# Patient Record
Sex: Female | Born: 1935 | Race: White | Hispanic: No | Smoking: Current every day smoker
Health system: Southern US, Community
[De-identification: ages and names within clinical notes are randomized; demographics above are authoritative.]

## PROBLEM LIST (undated history)

## (undated) DIAGNOSIS — N289 Disorder of kidney and ureter, unspecified: Secondary | ICD-10-CM

## (undated) DIAGNOSIS — I219 Acute myocardial infarction, unspecified: Secondary | ICD-10-CM

## (undated) DIAGNOSIS — E785 Hyperlipidemia, unspecified: Secondary | ICD-10-CM

## (undated) DIAGNOSIS — M810 Age-related osteoporosis without current pathological fracture: Secondary | ICD-10-CM

## (undated) DIAGNOSIS — I701 Atherosclerosis of renal artery: Secondary | ICD-10-CM

## (undated) DIAGNOSIS — C50919 Malignant neoplasm of unspecified site of unspecified female breast: Secondary | ICD-10-CM

## (undated) DIAGNOSIS — Z923 Personal history of irradiation: Secondary | ICD-10-CM

## (undated) DIAGNOSIS — I10 Essential (primary) hypertension: Secondary | ICD-10-CM

## (undated) DIAGNOSIS — M1990 Unspecified osteoarthritis, unspecified site: Secondary | ICD-10-CM

## (undated) DIAGNOSIS — I499 Cardiac arrhythmia, unspecified: Secondary | ICD-10-CM

## (undated) DIAGNOSIS — J449 Chronic obstructive pulmonary disease, unspecified: Secondary | ICD-10-CM

## (undated) DIAGNOSIS — Q828 Other specified congenital malformations of skin: Secondary | ICD-10-CM

## (undated) DIAGNOSIS — G43909 Migraine, unspecified, not intractable, without status migrainosus: Secondary | ICD-10-CM

## (undated) DIAGNOSIS — IMO0002 Reserved for concepts with insufficient information to code with codable children: Secondary | ICD-10-CM

## (undated) DIAGNOSIS — I2510 Atherosclerotic heart disease of native coronary artery without angina pectoris: Secondary | ICD-10-CM

## (undated) DIAGNOSIS — C3490 Malignant neoplasm of unspecified part of unspecified bronchus or lung: Secondary | ICD-10-CM

## (undated) HISTORY — PX: PARATHYROIDECTOMY: SHX19

## (undated) HISTORY — PX: TONSILLECTOMY: SUR1361

## (undated) HISTORY — DX: Chronic obstructive pulmonary disease, unspecified: J44.9

## (undated) HISTORY — PX: APPENDECTOMY: SHX54

## (undated) HISTORY — DX: Age-related osteoporosis without current pathological fracture: M81.0

## (undated) HISTORY — DX: Malignant neoplasm of unspecified part of unspecified bronchus or lung: C34.90

## (undated) HISTORY — PX: ABDOMINAL HYSTERECTOMY: SHX81

## (undated) HISTORY — DX: Atherosclerosis of renal artery: I70.1

## (undated) HISTORY — DX: Hyperlipidemia, unspecified: E78.5

## (undated) HISTORY — DX: Malignant neoplasm of unspecified site of unspecified female breast: C50.919

## (undated) HISTORY — DX: Essential (primary) hypertension: I10

## (undated) HISTORY — PX: OTHER SURGICAL HISTORY: SHX169

## (undated) HISTORY — DX: Acute myocardial infarction, unspecified: I21.9

## (undated) HISTORY — DX: Other specified congenital malformations of skin: Q82.8

## (undated) HISTORY — DX: Migraine, unspecified, not intractable, without status migrainosus: G43.909

---

## 2001-05-17 DIAGNOSIS — I252 Old myocardial infarction: Secondary | ICD-10-CM | POA: Insufficient documentation

## 2005-01-29 ENCOUNTER — Ambulatory Visit: Payer: Self-pay | Admitting: Internal Medicine

## 2005-03-03 ENCOUNTER — Ambulatory Visit: Payer: Self-pay | Admitting: Internal Medicine

## 2005-03-23 ENCOUNTER — Emergency Department: Payer: Self-pay | Admitting: Emergency Medicine

## 2005-08-06 ENCOUNTER — Ambulatory Visit: Payer: Self-pay | Admitting: Internal Medicine

## 2005-08-27 ENCOUNTER — Ambulatory Visit: Payer: Self-pay | Admitting: Internal Medicine

## 2006-02-04 ENCOUNTER — Ambulatory Visit: Payer: Self-pay | Admitting: Internal Medicine

## 2006-02-23 ENCOUNTER — Ambulatory Visit: Payer: Self-pay | Admitting: Internal Medicine

## 2006-08-31 ENCOUNTER — Ambulatory Visit: Payer: Self-pay | Admitting: Internal Medicine

## 2006-09-07 ENCOUNTER — Ambulatory Visit: Payer: Self-pay | Admitting: Internal Medicine

## 2007-10-19 ENCOUNTER — Ambulatory Visit: Payer: Self-pay | Admitting: Family Medicine

## 2008-05-17 ENCOUNTER — Ambulatory Visit: Payer: Self-pay | Admitting: Gastroenterology

## 2008-05-18 ENCOUNTER — Ambulatory Visit: Payer: Self-pay | Admitting: Unknown Physician Specialty

## 2009-03-05 ENCOUNTER — Ambulatory Visit: Payer: Self-pay | Admitting: Internal Medicine

## 2011-03-25 ENCOUNTER — Ambulatory Visit: Payer: Self-pay | Admitting: Internal Medicine

## 2011-03-26 ENCOUNTER — Ambulatory Visit: Payer: Self-pay | Admitting: Internal Medicine

## 2011-11-18 DIAGNOSIS — IMO0001 Reserved for inherently not codable concepts without codable children: Secondary | ICD-10-CM

## 2011-11-18 DIAGNOSIS — C50919 Malignant neoplasm of unspecified site of unspecified female breast: Secondary | ICD-10-CM

## 2011-11-18 DIAGNOSIS — Z923 Personal history of irradiation: Secondary | ICD-10-CM

## 2011-11-18 HISTORY — DX: Personal history of irradiation: Z92.3

## 2011-11-18 HISTORY — DX: Malignant neoplasm of unspecified site of unspecified female breast: C50.919

## 2011-11-18 HISTORY — PX: BREAST LUMPECTOMY: SHX2

## 2011-11-18 HISTORY — DX: Reserved for inherently not codable concepts without codable children: IMO0001

## 2011-12-01 ENCOUNTER — Ambulatory Visit: Payer: Self-pay | Admitting: Gastroenterology

## 2012-02-09 ENCOUNTER — Ambulatory Visit: Payer: Self-pay | Admitting: Internal Medicine

## 2012-07-09 ENCOUNTER — Ambulatory Visit: Payer: Self-pay | Admitting: Internal Medicine

## 2012-07-15 ENCOUNTER — Ambulatory Visit: Payer: Self-pay | Admitting: Internal Medicine

## 2012-08-03 ENCOUNTER — Ambulatory Visit: Payer: Self-pay | Admitting: Surgery

## 2012-08-03 HISTORY — PX: BREAST BIOPSY: SHX20

## 2012-08-04 LAB — PATHOLOGY REPORT

## 2012-08-12 ENCOUNTER — Ambulatory Visit: Payer: Self-pay | Admitting: Surgery

## 2012-08-12 LAB — BASIC METABOLIC PANEL
Anion Gap: 5 — ABNORMAL LOW (ref 7–16)
BUN: 18 mg/dL (ref 7–18)
Chloride: 106 mmol/L (ref 98–107)
Co2: 30 mmol/L (ref 21–32)
Creatinine: 1.26 mg/dL (ref 0.60–1.30)
EGFR (African American): 46 — ABNORMAL LOW
EGFR (Non-African Amer.): 40 — ABNORMAL LOW
Glucose: 83 mg/dL (ref 65–99)
Osmolality: 282 (ref 275–301)
Potassium: 4.8 mmol/L (ref 3.5–5.1)

## 2012-08-12 LAB — CBC
HCT: 42.3 % (ref 35.0–47.0)
HGB: 14.3 g/dL (ref 12.0–16.0)
MCH: 31.2 pg (ref 26.0–34.0)
MCV: 92 fL (ref 80–100)
Platelet: 250 10*3/uL (ref 150–440)
RBC: 4.59 10*6/uL (ref 3.80–5.20)
WBC: 9.1 10*3/uL (ref 3.6–11.0)

## 2012-08-19 ENCOUNTER — Ambulatory Visit: Payer: Self-pay | Admitting: Surgery

## 2012-08-27 LAB — PATHOLOGY REPORT

## 2012-09-01 ENCOUNTER — Ambulatory Visit: Payer: Self-pay | Admitting: Oncology

## 2012-09-17 ENCOUNTER — Ambulatory Visit: Payer: Self-pay | Admitting: Oncology

## 2012-09-23 LAB — CBC CANCER CENTER
Basophil #: 0.1 x10 3/mm (ref 0.0–0.1)
Basophil %: 0.9 %
Eosinophil #: 0.2 x10 3/mm (ref 0.0–0.7)
Eosinophil %: 1.6 %
HGB: 13.6 g/dL (ref 12.0–16.0)
Lymphocyte %: 20.1 %
MCH: 30.1 pg (ref 26.0–34.0)
MCHC: 32.1 g/dL (ref 32.0–36.0)
Neutrophil #: 6.7 x10 3/mm — ABNORMAL HIGH (ref 1.4–6.5)
Neutrophil %: 68.9 %
RBC: 4.52 10*6/uL (ref 3.80–5.20)
RDW: 14.1 % (ref 11.5–14.5)

## 2012-09-30 LAB — CBC CANCER CENTER
Basophil #: 0.1 x10 3/mm (ref 0.0–0.1)
Basophil %: 0.8 %
Eosinophil #: 0.2 x10 3/mm (ref 0.0–0.7)
HCT: 41.7 % (ref 35.0–47.0)
HGB: 13.6 g/dL (ref 12.0–16.0)
Lymphocyte %: 24.6 %
MCHC: 32.7 g/dL (ref 32.0–36.0)
MCV: 94 fL (ref 80–100)
Monocyte #: 0.6 x10 3/mm (ref 0.2–0.9)
Neutrophil #: 4.6 x10 3/mm (ref 1.4–6.5)
Neutrophil %: 64.2 %
Platelet: 221 x10 3/mm (ref 150–440)
RBC: 4.46 10*6/uL (ref 3.80–5.20)

## 2012-10-07 LAB — CBC CANCER CENTER
Basophil %: 1.4 %
Eosinophil #: 0.2 x10 3/mm (ref 0.0–0.7)
Eosinophil %: 2.9 %
HGB: 13.6 g/dL (ref 12.0–16.0)
Lymphocyte %: 23.2 %
MCH: 29.8 pg (ref 26.0–34.0)
MCHC: 31.7 g/dL — ABNORMAL LOW (ref 32.0–36.0)
Monocyte #: 0.7 x10 3/mm (ref 0.2–0.9)
Monocyte %: 8.9 %
Neutrophil %: 63.6 %
Platelet: 244 x10 3/mm (ref 150–440)
RBC: 4.55 10*6/uL (ref 3.80–5.20)
WBC: 8.1 x10 3/mm (ref 3.6–11.0)

## 2012-10-17 ENCOUNTER — Ambulatory Visit: Payer: Self-pay | Admitting: Oncology

## 2012-10-21 LAB — CBC CANCER CENTER
Basophil %: 1.1 %
Eosinophil #: 0.3 x10 3/mm (ref 0.0–0.7)
Eosinophil %: 4 %
HGB: 14 g/dL (ref 12.0–16.0)
Lymphocyte %: 23.8 %
Monocyte %: 10.2 %
Neutrophil %: 60.9 %
RBC: 4.6 10*6/uL (ref 3.80–5.20)
WBC: 7.4 x10 3/mm (ref 3.6–11.0)

## 2012-10-28 LAB — CBC CANCER CENTER
Basophil #: 0.1 x10 3/mm (ref 0.0–0.1)
Eosinophil %: 2.6 %
HCT: 41.7 % (ref 35.0–47.0)
HGB: 13.9 g/dL (ref 12.0–16.0)
Lymphocyte #: 1.8 x10 3/mm (ref 1.0–3.6)
Lymphocyte %: 21.6 %
MCV: 93 fL (ref 80–100)
Monocyte #: 0.8 x10 3/mm (ref 0.2–0.9)
Monocyte %: 10.3 %
Neutrophil %: 64.6 %
RDW: 14.8 % — ABNORMAL HIGH (ref 11.5–14.5)
WBC: 8.2 x10 3/mm (ref 3.6–11.0)

## 2012-11-04 LAB — CBC CANCER CENTER
Basophil #: 0.1 x10 3/mm (ref 0.0–0.1)
Eosinophil #: 0.2 x10 3/mm (ref 0.0–0.7)
Eosinophil %: 2.5 %
Lymphocyte #: 1.3 x10 3/mm (ref 1.0–3.6)
Lymphocyte %: 16.1 %
MCH: 30.9 pg (ref 26.0–34.0)
MCHC: 33.4 g/dL (ref 32.0–36.0)
MCV: 93 fL (ref 80–100)
Neutrophil #: 5.6 x10 3/mm (ref 1.4–6.5)
Neutrophil %: 70.9 %
Platelet: 237 x10 3/mm (ref 150–440)
RBC: 4.55 10*6/uL (ref 3.80–5.20)
RDW: 14.9 % — ABNORMAL HIGH (ref 11.5–14.5)

## 2012-11-17 ENCOUNTER — Ambulatory Visit: Payer: Self-pay | Admitting: Oncology

## 2012-11-17 DIAGNOSIS — C3490 Malignant neoplasm of unspecified part of unspecified bronchus or lung: Secondary | ICD-10-CM

## 2012-11-17 HISTORY — DX: Malignant neoplasm of unspecified part of unspecified bronchus or lung: C34.90

## 2013-01-31 ENCOUNTER — Ambulatory Visit: Payer: Self-pay | Admitting: Oncology

## 2013-04-19 ENCOUNTER — Ambulatory Visit: Payer: Self-pay | Admitting: Radiation Oncology

## 2013-05-17 ENCOUNTER — Ambulatory Visit: Payer: Self-pay | Admitting: Radiation Oncology

## 2013-06-28 ENCOUNTER — Ambulatory Visit: Payer: Self-pay | Admitting: Internal Medicine

## 2013-07-11 ENCOUNTER — Ambulatory Visit: Payer: Self-pay | Admitting: Surgery

## 2013-07-14 ENCOUNTER — Ambulatory Visit: Payer: Self-pay | Admitting: Oncology

## 2013-07-18 ENCOUNTER — Ambulatory Visit: Payer: Self-pay | Admitting: Oncology

## 2013-07-28 ENCOUNTER — Ambulatory Visit: Payer: Self-pay | Admitting: Cardiothoracic Surgery

## 2013-08-04 ENCOUNTER — Ambulatory Visit: Payer: Self-pay | Admitting: Cardiothoracic Surgery

## 2013-08-04 LAB — PROTIME-INR
INR: 1
Prothrombin Time: 13.2 secs (ref 11.5–14.7)

## 2013-08-17 ENCOUNTER — Ambulatory Visit: Payer: Self-pay | Admitting: Oncology

## 2013-08-17 ENCOUNTER — Ambulatory Visit: Payer: Self-pay | Admitting: Cardiothoracic Surgery

## 2013-08-17 ENCOUNTER — Ambulatory Visit: Payer: Self-pay | Admitting: Radiation Oncology

## 2013-09-17 ENCOUNTER — Ambulatory Visit: Payer: Self-pay | Admitting: Oncology

## 2013-09-17 ENCOUNTER — Ambulatory Visit: Payer: Self-pay | Admitting: Cardiothoracic Surgery

## 2013-10-17 ENCOUNTER — Ambulatory Visit: Payer: Self-pay | Admitting: Oncology

## 2013-10-17 ENCOUNTER — Ambulatory Visit: Payer: Self-pay | Admitting: Cardiothoracic Surgery

## 2013-12-12 ENCOUNTER — Ambulatory Visit: Payer: Self-pay | Admitting: Oncology

## 2013-12-12 LAB — CREATININE, SERUM
Creatinine: 1.29 mg/dL (ref 0.60–1.30)
EGFR (African American): 44 — ABNORMAL LOW
EGFR (Non-African Amer.): 38 — ABNORMAL LOW

## 2013-12-21 ENCOUNTER — Ambulatory Visit: Payer: Self-pay | Admitting: Oncology

## 2014-01-15 ENCOUNTER — Ambulatory Visit: Payer: Self-pay | Admitting: Oncology

## 2014-03-15 ENCOUNTER — Ambulatory Visit: Payer: Self-pay | Admitting: Oncology

## 2014-03-17 ENCOUNTER — Ambulatory Visit: Payer: Self-pay | Admitting: Oncology

## 2014-05-09 ENCOUNTER — Ambulatory Visit: Payer: Self-pay | Admitting: Oncology

## 2014-06-02 ENCOUNTER — Ambulatory Visit: Payer: Self-pay | Admitting: Oncology

## 2014-06-15 DIAGNOSIS — I739 Peripheral vascular disease, unspecified: Secondary | ICD-10-CM | POA: Insufficient documentation

## 2014-06-15 DIAGNOSIS — I701 Atherosclerosis of renal artery: Secondary | ICD-10-CM | POA: Insufficient documentation

## 2014-06-15 DIAGNOSIS — I10 Essential (primary) hypertension: Secondary | ICD-10-CM | POA: Insufficient documentation

## 2014-06-15 DIAGNOSIS — N183 Chronic kidney disease, stage 3 unspecified: Secondary | ICD-10-CM | POA: Insufficient documentation

## 2014-06-15 DIAGNOSIS — M810 Age-related osteoporosis without current pathological fracture: Secondary | ICD-10-CM | POA: Insufficient documentation

## 2014-06-15 DIAGNOSIS — C3411 Malignant neoplasm of upper lobe, right bronchus or lung: Secondary | ICD-10-CM | POA: Insufficient documentation

## 2014-06-17 ENCOUNTER — Ambulatory Visit: Payer: Self-pay | Admitting: Oncology

## 2014-06-19 ENCOUNTER — Ambulatory Visit: Payer: Self-pay | Admitting: Oncology

## 2014-06-19 LAB — CREATININE, SERUM
Creatinine: 1.28 mg/dL (ref 0.60–1.30)
EGFR (Non-African Amer.): 38 — ABNORMAL LOW
GFR CALC AF AMER: 44 — AB

## 2014-07-18 ENCOUNTER — Ambulatory Visit: Payer: Self-pay | Admitting: Oncology

## 2014-09-29 ENCOUNTER — Ambulatory Visit: Payer: Self-pay | Admitting: Oncology

## 2014-10-17 ENCOUNTER — Ambulatory Visit: Payer: Self-pay | Admitting: Oncology

## 2014-12-12 DIAGNOSIS — I071 Rheumatic tricuspid insufficiency: Secondary | ICD-10-CM | POA: Insufficient documentation

## 2014-12-12 DIAGNOSIS — I25118 Atherosclerotic heart disease of native coronary artery with other forms of angina pectoris: Secondary | ICD-10-CM | POA: Insufficient documentation

## 2014-12-12 DIAGNOSIS — I2510 Atherosclerotic heart disease of native coronary artery without angina pectoris: Secondary | ICD-10-CM | POA: Insufficient documentation

## 2014-12-12 DIAGNOSIS — R0681 Apnea, not elsewhere classified: Secondary | ICD-10-CM | POA: Insufficient documentation

## 2014-12-15 DIAGNOSIS — J300 Vasomotor rhinitis: Secondary | ICD-10-CM | POA: Insufficient documentation

## 2014-12-15 DIAGNOSIS — J410 Simple chronic bronchitis: Secondary | ICD-10-CM | POA: Insufficient documentation

## 2014-12-20 DIAGNOSIS — I340 Nonrheumatic mitral (valve) insufficiency: Secondary | ICD-10-CM | POA: Insufficient documentation

## 2014-12-25 ENCOUNTER — Ambulatory Visit: Payer: Self-pay | Admitting: Oncology

## 2014-12-25 ENCOUNTER — Ambulatory Visit: Payer: Self-pay | Admitting: Internal Medicine

## 2014-12-25 LAB — CREATININE, SERUM
Creatinine: 1.45 mg/dL — ABNORMAL HIGH (ref 0.60–1.30)
EGFR (African American): 44 — ABNORMAL LOW
EGFR (Non-African Amer.): 37 — ABNORMAL LOW

## 2015-01-16 ENCOUNTER — Ambulatory Visit
Admit: 2015-01-16 | Disposition: A | Discharge status: Home / Self Care | Payer: Self-pay | Attending: Oncology | Admitting: Oncology

## 2015-03-02 ENCOUNTER — Other Ambulatory Visit: Payer: Self-pay | Admitting: Oncology

## 2015-03-02 ENCOUNTER — Other Ambulatory Visit: Payer: Self-pay | Admitting: Internal Medicine

## 2015-03-02 DIAGNOSIS — C3490 Malignant neoplasm of unspecified part of unspecified bronchus or lung: Secondary | ICD-10-CM

## 2015-03-02 DIAGNOSIS — Z853 Personal history of malignant neoplasm of breast: Secondary | ICD-10-CM

## 2015-03-04 DIAGNOSIS — Z2839 Other underimmunization status: Secondary | ICD-10-CM | POA: Insufficient documentation

## 2015-03-04 DIAGNOSIS — Z9189 Other specified personal risk factors, not elsewhere classified: Secondary | ICD-10-CM

## 2015-03-05 ENCOUNTER — Emergency Department
Admit: 2015-03-05 | Disposition: A | Discharge status: Home / Self Care | Payer: Self-pay | Admitting: Emergency Medicine

## 2015-03-05 LAB — URINALYSIS, COMPLETE
Bacteria: NONE SEEN
Bilirubin,UR: NEGATIVE
Blood: NEGATIVE
Glucose,UR: NEGATIVE mg/dL (ref 0–75)
Ketone: NEGATIVE
Nitrite: NEGATIVE
PH: 5 (ref 4.5–8.0)
PROTEIN: NEGATIVE
Specific Gravity: 1.02 (ref 1.003–1.030)

## 2015-03-05 LAB — BASIC METABOLIC PANEL
Anion Gap: 7 (ref 7–16)
BUN: 38 mg/dL — AB
CHLORIDE: 107 mmol/L
CREATININE: 1.47 mg/dL — AB
Calcium, Total: 8.5 mg/dL — ABNORMAL LOW
Co2: 22 mmol/L
EGFR (African American): 37 — ABNORMAL LOW
GFR CALC NON AF AMER: 32 — AB
Glucose: 116 mg/dL — ABNORMAL HIGH
Potassium: 3.9 mmol/L
Sodium: 136 mmol/L

## 2015-03-05 LAB — CBC WITH DIFFERENTIAL/PLATELET
Basophil #: 0.1 10*3/uL (ref 0.0–0.1)
Basophil %: 0.6 %
Eosinophil #: 0.2 10*3/uL (ref 0.0–0.7)
Eosinophil %: 1.3 %
HCT: 39.8 % (ref 35.0–47.0)
HGB: 12.9 g/dL (ref 12.0–16.0)
Lymphocyte #: 1.8 10*3/uL (ref 1.0–3.6)
Lymphocyte %: 13.3 %
MCH: 30.2 pg (ref 26.0–34.0)
MCHC: 32.4 g/dL (ref 32.0–36.0)
MCV: 93 fL (ref 80–100)
MONO ABS: 1.2 x10 3/mm — AB (ref 0.2–0.9)
MONOS PCT: 8.8 %
Neutrophil #: 10.2 10*3/uL — ABNORMAL HIGH (ref 1.4–6.5)
Neutrophil %: 76 %
PLATELETS: 222 10*3/uL (ref 150–440)
RBC: 4.28 10*6/uL (ref 3.80–5.20)
RDW: 14 % (ref 11.5–14.5)
WBC: 13.4 10*3/uL — ABNORMAL HIGH (ref 3.6–11.0)

## 2015-03-05 LAB — TROPONIN I: Troponin-I: <0.03 ng/mL

## 2015-03-06 LAB — PRO B NATRIURETIC PEPTIDE: B-Type Natriuretic Peptide: 154 pg/mL — ABNORMAL HIGH

## 2015-03-06 NOTE — Consult Note (Signed)
Reason for Visit: This 81 year old Female patient presents to the clinic for initial evaluation of  Breast cancer .   Referred by Dr. Tamala Julian.  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 She underwent a needle biopsy which was positive for invasive mammary carcinoma. She subsequently underwent a wide local excision for a 1.2 cm invasive mammary carcinoma well differentiated ER positive PR borderline HER-2/neu not overexpressed. One sentinel lymph node was negative for metastatic disease. She tolerated her surgery well. She's been seen in medical oncology who will plan on deliveringaromatase inhibitor after completion of radiation. She seen today for radiation oncology opinion. She specifically denies breast tenderness cough or bone pain. She is healed extremely well.  Past Hx:    Valvular Disease:    Hypertension:    Myocardial Infarct: 2002   Renal Stenosis:    Left Breast Cancer:    Migraine headaches:    Parathyroidectomy:    hysterectomy:    Cardiac Stents:   Past, Family and Social History:   Past Medical History positive    Cardiovascular coronary artery stents; hyperlipidemia; hypertension; myocardial infarction; Valvular heart disease    Genitourinary Renal stenosis    Neurological/Psychiatric migraine    Past Surgical History appendectomy; Parathyroidectomy, total hysterectomy, tonsillectomy, bilateral cataract repair    Past Medical  History Comments Harolyn Rutherford dermatitis, osteoporosis    Family History positive    Family History Comments Family history positive for breast cancer, colon cancer, skin cancer, brain cancer, adult onset diabetes, cardia and S2 heart disease, lung disease and hypertension    Social History positive    Social History Comments Approximately 40-pack-year smoking history no EtOH abuse history    Additional Past Medical and Surgical History Accompanied by multiple family members today   Allergies:   Adhesive: Rash  Home Meds:  Home Medications: Medication Instructions Status  lisinopril 20 mg oral tablet 1 tab(s) orally once a day (in the morning) Active  simvastatin 40 mg oral tablet 1 tab(s) orally once a day (at bedtime) Active  Omega-3 1200 mg with Vit E 1 tab(s) orally once a day (in the morning) Active  Metoprolol Succinate ER 50 mg oral tablet, extended release 0.5 tab(s) orally 2 times a day Active  multivitamin 1 tab(s) orally once a day (in the morning) Active  Calcium 600+D 1 tab(s) orally once a day (at bedtime) Active  aspirin buffered 325 mg oral tablet 1 tab(s) orally once a day (in the morning). Stopped 1 week ago. Active  fluticasone 50 mcg/inh nasal spray 2 spray(s) each nostril once a day (at bedtime), As Needed Active  Norco 5 mg-325 mg oral tablet 1 tab(s) orally every 4 hours as needed for pain Active   Review of Systems:   General negative    Performance Status (ECOG) 0    Skin see HPI    Breast see HPI    Ophthalmologic see HPI    ENMT negative    Respiratory and Thorax  negative    Cardiovascular see HPI    Gastrointestinal negative    Genitourinary negative    Musculoskeletal negative    Neurological see HPI    Psychiatric negative    Hematology/Lymphatics negative    Endocrine see HPI    Allergic/Immunologic negative    Review of Systems   aside for parathyroidectomy for benign adenoma, migraine headaches, Harolyn Rutherford dermatitis and  cardiovascular risk factors Patient denies any weight loss, fatigue, weakness, fever, chills or night sweats. Patient denies any loss of vision, blurred vision. Patient denies any ringing  of the ears or hearing loss. No irregular heartbeat. Patient denies heart murmur or history of fainting. Patient denies any chest pain or pain radiating to her upper extremities. Patient denies any shortness of breath, difficulty breathing at night, cough or hemoptysis. Patient denies any swelling in the lower legs. Patient denies any nausea vomiting, vomiting of blood, or coffee ground material in the vomitus. Patient denies any stomach pain. Patient states has had normal bowel movements no significant constipation or diarrhea. Patient denies any dysuria, hematuria or significant nocturia. Patient denies any problems walking, swelling in the joints or loss of balance. Patient denies any skin changes, loss of hair or loss of weight. Patient denies any excessive worrying or anxiety or significant depression. Patient denies any problems with insomnia. Patient denies excessive thirst, polyuria, polydipsia. Patient denies any swollen glands, patient denies easy bruising or easy bleeding. Patient denies any recent infections, allergies or URI. Patient "s visual fields have not changed significantly in recent time.  Nursing Notes:  Nursing Vital Signs and Chemo Nursing Nursing Notes: *CC Vital Signs Flowsheet:   16-Oct-13 11:34   Temp Temperature 97.7   Pulse Pulse 68   Respirations Respirations 20   SBP SBP 155   DBP DBP 78   Pain Scale (0-10)  0   Pulse Oxi  97   Current Weight (kg) (kg) 56.3   Physical Exam:  General/Skin/HEENT:   General normal    Skin normal    Eyes normal    ENMT normal    Head and Neck normal    Additional PE Well-developed thin female in NAD appears younger than stated age. Lungs are clear to A&P cardiac examination shows regular rate and rhythm. Abdomen is benign with no organomegaly  or masses noted. She status post wide local excision of the left breast with incision healing well. No dominant mass or nodularity is noted in either breast into position examined. No axillary or supraclavicular adenopathy is appreciated.   Breasts/Resp/CV/GI/GU:   Respiratory and Thorax normal    Cardiovascular normal    Gastrointestinal normal    Genitourinary normal   MS/Neuro/Psych/Lymph:   Musculoskeletal normal    Neurological normal    Lymphatics normal   Other Results:  Radiology Results: [place]:    09-May-12 12:23, US Breast Left   US Breast Left    REASON FOR EXAM:    nodule  COMMENTS:       PROCEDURE: [place]  - US BREAST LEFT  - Mar 26 2011 12:23PM     RESULT: On the patient's screening mammogram of 25 Mar 2011 nodularity was   noted inferomedially.  Ultrasound was performed today of this region. No   discrete abnormal mass is identified.  There is a tiny calcification with   distal shadowing demonstrated at approximately the 7 o'clock position but   a discrete mass here is not identified.     IMPRESSION:  1.The directed ultrasound examination does not reveal a discrete mass in   the inferomedial region of the breast. Please see the dictation of the     diagnostic mammogram of this same day for final recommendations and   BI-RADS classification.     Thank you for the opportunity to contribute to the care of your patient.            Verified By: DAVID A. Martinique, M.D., MD   Assessment and Plan:  Impression:   stage I invasive mammary carcinoma the left breast in 81 year old female ER/PR positive PR borderline not to receive adjuvant chemotherapy for adjuvant radiation therapy to her left breast  Plan:   at this time I determine her breast size is too small for MammoSite catheter placement. Would like to go ahead with whole breast radiation therapy to 5000 cGy over 5 weeks and then boost or scar another 1400 cGy. Risks and benefits of treatment were discussed  with the patient and her family and all seem to comprehend my treatment plan well. Side effects such as skin reaction, fatigue, alteration blood counts, and some inclusion of small superficial area of lung were all discussed in detail with the patient. I have set her up for CT simulation early next week.  I would like to take this opportunity to thank you for allowing me to continue to participate in this patient's care.  CC Referral:   cc: Dr. Tamala Julian, Dr. Ezequiel Kayser   Electronic Signatures: Baruch Gouty, Roda Shutters (MD)  (Signed 24-Oct-13 16:13)  Authored: HPI, Diagnosis, Past Hx, PFSH, Allergies, Home Meds, ROS, Nursing Notes, Physical Exam, Other Results, Encounter Assessment and Plan, CC Referring Physician   Last Updated: 24-Oct-13 16:13 by Armstead Peaks (MD)

## 2015-03-06 NOTE — Op Note (Signed)
PATIENT NAME:  [name] [name] SHOR MR#:  [id number] DATE OF BIRTH:  October 01, 1928  DATE OF PROCEDURE:  08/19/2012  PREOPERATIVE DIAGNOSIS: Carcinoma of the left breast.   POSTOPERATIVE DIAGNOSIS: Carcinoma of the left breast.   PROCEDURE: Left partial mastectomy with axillary sentinel lymph node biopsy.   SURGEON: Rochel Brome, M.D.   ANESTHESIA: General.   INDICATIONS: This 81 year old female had recent mammogram depicting a stellate mass in the inferior central aspect of the left breast. Biopsy demonstrated invasive well-differentiated mammary carcinoma. She had preoperative insertion of a Kopans wire with mammogram depicting the location of the biopsy and the mass. Also had preoperative injection of radioactive technetium sulfur colloid and sentinel lymph node scanning demonstrating location of axillary lymph node.  DESCRIPTION OF PROCEDURE: The patient was placed on the operating table in the supine position under general anesthesia. The dressing was removed from the left breast exposing the Kopans wire which entered the breast in the lower inner quadrant. The wire was cut 2 cm from the skin. The left arm was placed on a lateral arm rest. The breast, chest wall, and upper arm were prepared with ChloraPrep and draped in a sterile manner.   The gamma counter was used in the axilla to demonstrate location of radioactivity, in the inferior aspect of the left axilla. An oblique incision was made some 4 cm in length and dissection was carried down through subcutaneous tissues. Electrocautery was used for hemostasis. Dissection was carried down deeply within the axilla adjacent to the rib cage where a lymph node was encountered which was radioactive as demonstrated with the gamma counter. The lymph node was dissected free from surrounding structures removing a small rim of fatty material surrounding the lymph node. The ex vivo count was in the range of 350 to 450 counts/second and was submitted for frozen  section. The background count was less than 8. The wound was inspected, hemostasis was intact.   Attention was turned to do the left partial mastectomy. A transversely oriented curvilinear incision was made from the 4 o'clock to the 8 o'clock position of the breast and also removed an ellipse of skin which was approximately 1 cm wide. Dissection was carried down through subcutaneous tissues and encountered the wire and dissected out the palpable mass removing normal tissue surrounding the mass using the wire and tactile sensation for localization. A stitch was placed at the 8 o'clock position for the pathologist's orientation. Also, the specimen was marked with margin markers sutured to the specimen with 3-0 nylon. The specimen was submitted for specimen mammogram which depicted the location of the lesion and sent for pathology.   The pathologist called back twice during the course of surgery. First to indicate that the sentinel lymph node appeared to be negative for cancer and the second time to indicate that the margins appeared to be satisfactory for the partial mastectomy specimen.   The axillary wound was inspected. Hemostasis was intact. The wound was closed with a running 5-0 Monocryl subcuticular suture. The partial mastectomy specimen was inspected. Several small bleeding points were cauterized. Hemostasis was subsequently intact. Both wounds were injected with 0.5% Sensorcaine with epinephrine. The subcutaneous tissues were treated with 4-0 chromic sutures for approximation and then the skin was closed with running 5-0 Monocryl. Both wounds were treated with Dermabond. The patient tolerated the procedure satisfactorily and was then prepared for transfer to the recovery room. ____________________________ Lenna Sciara. Rochel Brome, MD jws:[id number] D: 08/19/2012 13:09:23 ET T: 08/19/2012 13:39:05 ET JOB#: [id number]  cc: Loreli Dollar, MD, <Dictator> Loreli Dollar MD ELECTRONICALLY SIGNED 08/22/2012 19:30

## 2015-03-09 NOTE — Consult Note (Signed)
Reason for Visit: This 81 year old Female patient presents to the clinic for initial evaluation of  lung cancer .   Referred by Dr. Grayland Ormond.  Diagnosis:  Chief Complaint/Diagnosis   81 year old female with right upper lobe adenocarcinoma inpatient status post radiation therapy when you're prior for breast cancer  Pathology Report pathology were reviewed   Imaging Report CT scans reviewed and PET CT scan ordered   Referral Report clinical notes reviewed   Planned Treatment Regimen probable IMRT radiation therapy   HPI   patient is a pleasant 81 year old female well known to our Department who is now out 9 months having completed radiation therapy to her left breast for a 1.2 cm invasive memory carcinoma ER positive.according to the patient she's been followed for several years for enlarging right upper lobe mass. She has a lifelong smoking history. Recent chest x-ray showed a large in the right upper lobe mass confirmed on CT scan. Underwent CT-guided biopsy which was positive for adenocarcinoma with lepedic features.she has a past medical history significant for COPDand based on her age prior diagnosis and underlying COPD with cardia vascular heart disease was thought to be a high-risk patient for surgery. She seen today for radiation oncology opinion. She is asymptomatic at this time. She specifically denies cough hemoptysis or chest tightness.  Past Hx:    Valvular Disease:    Hypertension:    Myocardial Infarct: 2002   Renal Stenosis:    Left Breast Cancer:    Migraine headaches:    Parathyroidectomy:    hysterectomy:    Cardiac Stents:   Past, Family and Social History:  Past Medical History positive   Cardiovascular coronary artery disease; hyperlipidemia; hypertension; myocardial infarction   Neurological/Psychiatric migraine   Past Surgical History appendectomy; cataracts, hysterectomy, tonsillectomy, bilateral cataracts, parathyroid adenoma status post  resection   Past Medical History Comments osteoarthritis, osteoporosis   Family History positive   Family History Comments strong family history for multiple malignancies including lung: Breast cancer. Mother expired from lung cancer   Social History positive   Social History Comments 80 pack is smoking history continues to smoke a half a pack a day no EtOH abuse history   Additional Past Medical and Surgical History accompanied by her daughter today   Allergies:   Adhesive: Rash  Home Meds:  Home Medications: Medication Instructions Status  letrozole 2.5 mg tablet 1 tab(s) orally once a day Active  traMADol 50 mg oral tablet 1 tab(s) orally every 6 hours, As Needed - for Pain Active  TheraTears - ophthalmic solution 1 drop(s) to each affected eye once a day Active  Metoprolol Succinate ER 50 mg oral tablet, extended release 1 tab(s) orally once a day Active  aspirin buffered 325 mg oral tablet 1 tab(s) orally once a day (in the morning).  Active  lisinopril 20 mg oral tablet 1 tab(s) orally once a day (in the morning) Active  simvastatin 40 mg oral tablet 1 tab(s) orally once a day (at bedtime) Active  Omega-3 1200 mg with Vit E 1 tab(s) orally once a day (in the morning) Active  multivitamin 1 tab(s) orally once a day (in the morning) Active  Calcium 600+D 1 tab(s) orally once a day (at bedtime) Active  fluticasone 50 mcg/inh nasal spray 2 spray(s) each nostril once a day (at bedtime), As Needed Active   Review of Systems:  General negative   Performance Status (ECOG) 0   Skin negative   Breast see HPI   Ophthalmologic  negative   ENMT negative   Respiratory and Thorax see HPI   Cardiovascular see HPI   Gastrointestinal negative   Genitourinary negative   Musculoskeletal negative   Neurological negative   Psychiatric negative   Hematology/Lymphatics negative   Endocrine negative   Allergic/Immunologic negative   Nursing Notes:  Nursing Vital Signs  and Chemo Nursing Nursing Notes: *CC Vital Signs Flowsheet:   24-Sep-14 13:41  Temp Temperature 97.4  Pulse Pulse 62  Respirations Respirations 18  SBP SBP 167  DBP DBP 96   Physical Exam:  General/Skin/HEENT:  General normal   Skin normal   Eyes normal   ENMT normal   Head and Neck normal   Additional PE well-developed thin female in NAD. No cervical or supraclavicular adenopathy is appreciated. Lungs are clear to A&P cardiac examination shows regular rate and rhythm abdomen is benign with organomegaly or masses noted. No peripheral edema or adenopathy is detected.   Breasts/Resp/CV/GI/GU:  Respiratory and Thorax normal   Cardiovascular normal   Gastrointestinal normal   Genitourinary normal   MS/Neuro/Psych/Lymph:  Musculoskeletal normal   Neurological normal   Lymphatics normal   Other Results:  Radiology Results: LabUnknown:    12-Aug-14 09:29, CT Chest Without Contrast  PACS Image   CT:  CT Chest Without Contrast   REASON FOR EXAM:    RT mid lung nodular density  dr Haynes Dage  COMMENTS:       PROCEDURE: MCT - MCT CHEST WITHOUT CONTRAST  - Jun 28 2013  9:29AM     RESULT: Chest CT without contrast is reconstructed at 3 mm slice   thickness in the axial plane. The patient has a previous CT of the chest   without contrast from 09/07/2006.    There is hazy ground glass attenuation with irregular margins in the   right upper lobe between images 37 and 42 most prominently seen on image   39 and showing an overall measurement anterior to posterior of 1.8 cm.   This is larger than the area seen previously on image 25 which showed   greatest dimension transversely a 12 mm. There is underlying   centrilobular emphysema. No other pulmonary mass or nodule is present.     There is no infiltrate, edema, effusion or pneumothorax. Dependent   atelectasis is seen in both lung bases, greater on the right than on the   left. Stable subpleural nodular density in the  right lung base best   appreciated in the area of image 84on the current study and measuring 5   mm. There is no evidence of mediastinal or hilar mass or adenopathy.   Prominent atherosclerotic calcification is present within the aorta. Left   adrenal calcification is present with some mild enlargement of the   adrenal gland and nodularity. This is unchanged. On image 88 this   measures approximately 1.56 cm. The included portions of the kidneys are   unremarkable. No radiopaque gallstones are evident. The liver, spleen and   pancreas included on the study are unremarkable. The visualized bowel is   within normal limits. Degenerative changes are present in the spine.    IMPRESSION:   1. Ground glass attenuation appears to be enlarging in size in the right     upper lobe.  2. Diffuse emphysematous changes.  3. Stable nodular density in the left adrenal gland with calcification.    Dictation Site: 1        Verified By: Sundra Aland, M.D., MD  Relevent Results:   Relevant Scans and Labs cT scans are reviewed. 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 Plan:   at this time I to complete his staging workup on her lung cancer not ordered a PET/CT scan. Will review that before making any definitive recommendations. I believe I can treat with radiation therapy as a sole modality to a right upper lobe and control her disease. We'll make final determinations about extensive disease and treatment fields when I review her PET/CT scan. I have set her up for followup shortly after PET/CT to discuss those results and possibly due radiation therapy treatment planning. Both her daughter and patient comprehend my treatment plan well.  I would like to take this opportunity to thank you for allowing me to continue to  participate in this patient's care.  CC Referral:  cc: Dr. Ezequiel Kayser   Electronic Signatures: Baruch Gouty, Roda Shutters (MD)  (Signed 24-Sep-14 15:58)  Authored: HPI, Diagnosis, Past Hx, PFSH, Allergies, Home Meds, ROS, Nursing Notes, Physical Exam, Other Results, Relevent Results, Encounter Assessment and Plan, CC Referring Physician   Last Updated: 24-Sep-14 15:58 by Armstead Peaks (MD)

## 2015-03-11 LAB — CULTURE, BLOOD (SINGLE)

## 2015-04-02 ENCOUNTER — Encounter: Payer: Self-pay | Admitting: Radiation Oncology

## 2015-04-02 ENCOUNTER — Ambulatory Visit
Admission: RE | Admit: 2015-04-02 | Discharge: 2015-04-02 | Disposition: A | Discharge status: Home / Self Care | Payer: Medicare Other | Source: Ambulatory Visit | Attending: Radiation Oncology | Admitting: Radiation Oncology

## 2015-04-02 VITALS — BP 145/78 | HR 73 | Temp 96.0°F | Resp 18 | Wt 119.3 lb

## 2015-04-02 DIAGNOSIS — C3491 Malignant neoplasm of unspecified part of right bronchus or lung: Secondary | ICD-10-CM

## 2015-04-02 NOTE — Progress Notes (Signed)
Radiation Oncology Follow up Note  Name: [name] [name] [facility]   Date:   04/02/2015 MRN:  [id number] DOB: 04-26-1928    This 81 y.o. female presents to the clinic today for follow-up of both breast and lung cancer.  REFERRING PROVIDER: No ref. provider found  HPI: Patient is an 81 year old female now out 2-1/2 years having completed radiation therapy to her left breast as well as 18 months out from SB RT to her right upper lobe. She is seen today in routine follow-up and is doing well. She's currently on letrozole for breast cancer tolerating that without side effect. Follow-up CT scans of shown excellent resolution of mass in the right upper lobe. Her follow-up mammograms of also been fine showing no evidence of disease. She is currently on letrozole tolerating that well without side effect..  COMPLICATIONS OF TREATMENT: none  FOLLOW UP COMPLIANCE: keeps appointments   PHYSICAL EXAM:  BP 145/78 mmHg  Pulse 73  Temp(Src) 96 F (35.6 C)  Resp 18  Wt 119 lb 4.3 oz (54.1 kg) Well-developed female in NAD. Lungs are clear to A&P cardiac examination essentially unremarkable with regular rate and rhythm. No dominant mass or nodularity is noted in either breast in 2 positions examined. Incision is well-healed. No axillary or supraclavicular adenopathy is appreciated. Cosmetic result is excellent. Lungs are clear to A&P cardiac examination shows regular rate and rhythm. She has slight retraction of the left breast towards her scar cosmetic result is still good to excellent. Well-developed well-nourished patient in NAD. HEENT reveals PERLA, EOMI, discs not visualized.  Oral cavity is clear. No oral mucosal lesions are identified. Neck is clear without evidence of cervical or supraclavicular adenopathy. Lungs are clear to A&P. Cardiac examination is essentially unremarkable with regular rate and rhythm without murmur rub or thrill. Abdomen is benign with no organomegaly or masses noted. Motor sensory  and DTR levels are equal and symmetric in the upper and lower extremities. Cranial nerves II through XII are grossly intact. Proprioception is intact. No peripheral adenopathy or edema is identified. No motor or sensory levels are noted. Crude visual fields are within normal range.   RADIOLOGY RESULTS: Mammograms are reviewed showing no evidence of disease. CT scan is also reviewed again showing excellent resolution of right upper lobe mass.  PLAN: At this time patient continues to do well with no evidence of disease. I am please were overall progress. I've asked to see her back in a 1 year for follow-up. Patient is to call sooner with any concerns.  I would like to take this opportunity for allowing me to participate in the care of your patient.Armstead Peaks., MD

## 2015-04-26 ENCOUNTER — Other Ambulatory Visit: Payer: Self-pay | Admitting: Family Medicine

## 2015-06-11 ENCOUNTER — Ambulatory Visit
Admission: EM | Admit: 2015-06-11 | Discharge: 2015-06-11 | Disposition: A | Discharge status: Home / Self Care | Payer: Medicare Other | Attending: Family Medicine | Admitting: Family Medicine

## 2015-06-11 DIAGNOSIS — R300 Dysuria: Secondary | ICD-10-CM | POA: Insufficient documentation

## 2015-06-11 DIAGNOSIS — J449 Chronic obstructive pulmonary disease, unspecified: Secondary | ICD-10-CM | POA: Diagnosis not present

## 2015-06-11 DIAGNOSIS — R531 Weakness: Secondary | ICD-10-CM | POA: Diagnosis present

## 2015-06-11 DIAGNOSIS — Z85118 Personal history of other malignant neoplasm of bronchus and lung: Secondary | ICD-10-CM | POA: Diagnosis not present

## 2015-06-11 DIAGNOSIS — Z853 Personal history of malignant neoplasm of breast: Secondary | ICD-10-CM | POA: Insufficient documentation

## 2015-06-11 DIAGNOSIS — I10 Essential (primary) hypertension: Secondary | ICD-10-CM | POA: Diagnosis not present

## 2015-06-11 DIAGNOSIS — E785 Hyperlipidemia, unspecified: Secondary | ICD-10-CM | POA: Diagnosis not present

## 2015-06-11 DIAGNOSIS — N390 Urinary tract infection, site not specified: Secondary | ICD-10-CM | POA: Insufficient documentation

## 2015-06-11 DIAGNOSIS — R109 Unspecified abdominal pain: Secondary | ICD-10-CM | POA: Diagnosis present

## 2015-06-11 LAB — COMPREHENSIVE METABOLIC PANEL
ALT: 13 U/L — AB (ref 14–54)
AST: 18 U/L (ref 15–41)
Albumin: 3.6 g/dL (ref 3.5–5.0)
Alkaline Phosphatase: 73 U/L (ref 38–126)
Anion gap: 10 (ref 5–15)
BUN: 22 mg/dL — ABNORMAL HIGH (ref 6–20)
CO2: 27 mmol/L (ref 22–32)
Calcium: 8.8 mg/dL — ABNORMAL LOW (ref 8.9–10.3)
Chloride: 100 mmol/L — ABNORMAL LOW (ref 101–111)
Creatinine, Ser: 1.16 mg/dL — ABNORMAL HIGH (ref 0.44–1.00)
GFR calc Af Amer: 48 mL/min — ABNORMAL LOW (ref >60)
GFR calc non Af Amer: 42 mL/min — ABNORMAL LOW (ref >60)
GLUCOSE: 118 mg/dL — AB (ref 65–99)
POTASSIUM: 4.4 mmol/L (ref 3.5–5.1)
SODIUM: 137 mmol/L (ref 135–145)
Total Bilirubin: 0.3 mg/dL (ref 0.3–1.2)
Total Protein: 7.2 g/dL (ref 6.5–8.1)

## 2015-06-11 LAB — CBC WITH DIFFERENTIAL/PLATELET
BASOS ABS: 0.1 10*3/uL (ref 0–0.1)
BASOS PCT: 1 %
EOS PCT: 2 %
Eosinophils Absolute: 0.3 10*3/uL (ref 0–0.7)
HEMATOCRIT: 42 % (ref 35.0–47.0)
Hemoglobin: 13.6 g/dL (ref 12.0–16.0)
LYMPHS ABS: 1.4 10*3/uL (ref 1.0–3.6)
Lymphocytes Relative: 12 %
MCH: 29.5 pg (ref 26.0–34.0)
MCHC: 32.4 g/dL (ref 32.0–36.0)
MCV: 91.1 fL (ref 80.0–100.0)
MONOS PCT: 4 %
Monocytes Absolute: 0.5 10*3/uL (ref 0.2–0.9)
Neutro Abs: 9.6 10*3/uL — ABNORMAL HIGH (ref 1.4–6.5)
Neutrophils Relative %: 81 %
PLATELETS: 339 10*3/uL (ref 150–440)
RBC: 4.61 MIL/uL (ref 3.80–5.20)
RDW: 14.9 % — ABNORMAL HIGH (ref 11.5–14.5)
WBC: 11.9 10*3/uL — AB (ref 3.6–11.0)

## 2015-06-11 LAB — URINALYSIS COMPLETE WITH MICROSCOPIC (ARMC ONLY)
BILIRUBIN URINE: NEGATIVE
Glucose, UA: NEGATIVE mg/dL
Hgb urine dipstick: NEGATIVE
KETONES UR: NEGATIVE mg/dL
Leukocytes, UA: NEGATIVE
Nitrite: NEGATIVE
Protein, ur: 30 mg/dL — AB
RBC / HPF: NONE SEEN RBC/hpf (ref <3)
Specific Gravity, Urine: 1.025 (ref 1.005–1.030)
pH: 5.5 (ref 5.0–8.0)

## 2015-06-11 MED ORDER — ONDANSETRON 8 MG PO TBDP
8.0000 mg | ORAL_TABLET | Freq: Once | ORAL | Status: AC
  Administered 2015-06-11: 8 mg via ORAL

## 2015-06-11 MED ORDER — ONDANSETRON HCL 8 MG PO TABS: 8.0000 mg | ORAL_TABLET | Freq: Two times a day (BID) | ORAL | Status: DC

## 2015-06-11 MED ORDER — SULFAMETHOXAZOLE-TRIMETHOPRIM 800-160 MG PO TABS
1.0000 | ORAL_TABLET | Freq: Two times a day (BID) | ORAL | Status: AC
Start: 2015-06-11 — End: 2015-06-20

## 2015-06-11 NOTE — ED Provider Notes (Addendum)
CSN: [id number]     Arrival date & time 06/11/15  1093 History   First MD Initiated Contact with Patient 06/11/15 [phone]     Chief Complaint  Patient presents with  . Weakness  . Flank Pain   (Consider location/radiation/quality/duration/timing/severity/associated sxs/prior Treatment) HPI Comments: Elderly caucasian female here with daughter for evaluation of left flank pain, nausea, trembling with exertion the past week, foul smell to urine.  Daughter is a paramedic and reported patient has been weak and not as independent with ADLs as usual.  In wheelchair today ams rough for patient with her COPD typically wheezes am with nonproductive cough until after am medications.  Air quality orange/alert this week and temperatures 100 outside.  Patient has taken phenergan and zofran in the past without difficulty prefers phenergan.  Scheduled to see PCM this upcoming Friday for routine follow up of chronic conditions Dr Raechel Ache. Did not eat breakfast this am yesterday spaghetti and tomato sandwiches typically doesn't eat breakfast  Voided this am. PMHx:  Right lung cancer, COPD, HTN, hyperlipidemia PSHx: left breast lumpectomy, hysterectomy, parathyroidectomy, smoker 5 cigarettes per day FHx: denied cancer, diabetes, sudden death  The history is provided by the patient and a relative.    Past Medical History  Diagnosis Date  . COPD (chronic obstructive pulmonary disease)   . Lung cancer   . Breast cancer     left  . Hypertension   . Migraine   . Renal artery stenosis   . Myocardial infarction   . Osteoporosis   . Hailey-hailey disease   . Hyperlipemia    Past Surgical History  Procedure Laterality Date  . Breast lumpectomy Left   . Parathyroidectomy    . Abdominal hysterectomy    . Cardiac stents    . Appendectomy    . Tonsillectomy     History reviewed. No pertinent family history. History  Substance Use Topics  . Smoking status: Current Every Day Smoker -- 1.00 packs/day for 70 years   . Smokeless tobacco: Not on file  . Alcohol Use: No   OB History    No data available     Review of Systems  Constitutional: Positive for activity change and fatigue. Negative for fever, chills, diaphoresis and appetite change.  HENT: Negative for congestion, dental problem, drooling, ear discharge, ear pain, facial swelling, hearing loss, mouth sores, nosebleeds, postnasal drip, rhinorrhea, sinus pressure, sneezing, sore throat, tinnitus, trouble swallowing and voice change.   Eyes: Negative for photophobia, pain, discharge, redness, itching and visual disturbance.  Respiratory: Positive for cough and wheezing. Negative for apnea, choking, chest tightness, shortness of breath and stridor.   Cardiovascular: Negative for chest pain, palpitations and leg swelling.  Gastrointestinal: Positive for nausea. Negative for vomiting, abdominal pain, diarrhea, constipation, blood in stool, abdominal distention and anal bleeding.  Endocrine: Negative for cold intolerance and heat intolerance.  Genitourinary: Positive for flank pain. Negative for dysuria, frequency and difficulty urinating.  Musculoskeletal: Negative for joint swelling, gait problem, neck pain and neck stiffness.  Skin: Negative for color change, pallor, rash and wound.  Allergic/Immunologic: Negative for environmental allergies and food allergies.  Neurological: Positive for weakness. Negative for dizziness, tremors, seizures, syncope, facial asymmetry, speech difficulty, light-headedness, numbness and headaches.  Hematological: Negative for adenopathy. Does not bruise/bleed easily.  Psychiatric/Behavioral: Negative for behavioral problems, confusion, sleep disturbance and agitation.    Allergies  Tape  Home Medications   Prior to Admission medications   Medication Sig Start Date End Date Taking? Authorizing Provider  amLODipine (NORVASC) 2.5 MG tablet Take 2.5 mg by mouth daily.   Yes Historical Provider, MD  aspirin 325 MG  tablet Take 325 mg by mouth daily.   Yes Historical Provider, MD  Calcium Carbonate-Vitamin D (CALCIUM-VITAMIN D) 500-200 MG-UNIT per tablet Take 1 tablet by mouth daily.   Yes Historical Provider, MD  fluticasone (FLONASE) 50 MCG/ACT nasal spray Place 2 sprays into both nostrils daily.   Yes Historical Provider, MD  ketotifen (ZADITOR) 0.025 % ophthalmic solution 1 drop 2 (two) times daily.   Yes Historical Provider, MD  ketotifen (ZADITOR) 0.025 % ophthalmic solution Place 1 drop into both eyes 2 (two) times daily.   Yes Historical Provider, MD  letrozole [facility]) 2.5 MG tablet TAKE 1 TABLET DAILY 05/01/15  Yes Evlyn Kanner, NP  lisinopril (PRINIVIL,ZESTRIL) 20 MG tablet Take 20 mg by mouth daily.   Yes Historical Provider, MD  metoprolol succinate (TOPROL-XL) 50 MG 24 hr tablet Take 25 mg by mouth daily. Take with or immediately following a meal.   Yes Historical Provider, MD  Multiple Vitamin (MULTIVITAMIN) tablet Take 1 tablet by mouth daily.   Yes Historical Provider, MD  Omega 3 1200 MG CAPS Take 1 capsule by mouth daily.   Yes Historical Provider, MD  simvastatin (ZOCOR) 40 MG tablet Take 40 mg by mouth daily.   Yes Historical Provider, MD  traMADol (ULTRAM) 50 MG tablet Take by mouth every 6 (six) hours as needed.   Yes Historical Provider, MD  ondansetron (ZOFRAN) 8 MG tablet Take 1 tablet (8 mg total) by mouth 2 (two) times daily. 06/11/15   Olen Cordial, NP  sulfamethoxazole-trimethoprim (BACTRIM DS,SEPTRA DS) 800-160 MG per tablet Take 1 tablet by mouth 2 (two) times daily. 06/11/15 06/20/15  Olen Cordial, NP   BP 127/64 mmHg  Pulse 68  Temp(Src) 97.6 F (36.4 C) (Tympanic)  Resp 16  Ht '5\' 4"'$  (1.626 m)  Wt 120 lb (54.432 kg)  BMI 20.59 kg/m2  SpO2 96%  LMP  Physical Exam  Constitutional: She is oriented to person, place, and time. Vital signs are normal. She appears well-developed and well-nourished. No distress.  HENT:  Head: Normocephalic and atraumatic.  Right  Ear: Hearing, external ear and ear canal normal. A middle ear effusion is present.  Left Ear: Hearing, external ear and ear canal normal. A middle ear effusion is present.  Nose: Nose normal.  Mouth/Throat: Uvula is midline and mucous membranes are normal. She does not have dentures. No oral lesions. No trismus in the jaw. Normal dentition. No dental abscesses, uvula swelling, lacerations or dental caries. Posterior oropharyngeal edema and posterior oropharyngeal erythema present. No oropharyngeal exudate or tonsillar abscesses.  Mild cobblestoning posterior pharynx; bilateral TMs with air fluid level clear  Eyes: Conjunctivae, EOM and lids are normal. Pupils are equal, round, and reactive to light. Right eye exhibits no discharge. Left eye exhibits no discharge. No scleral icterus.  Neck: Trachea normal and normal range of motion. Neck supple. No tracheal deviation present. No thyromegaly present.  Cardiovascular: Normal rate, regular rhythm, normal heart sounds and intact distal pulses.  Exam reveals no gallop and no friction rub.   No murmur heard. Pulmonary/Chest: Effort normal and breath sounds normal. No accessory muscle usage or stridor. No respiratory distress. She has no decreased breath sounds. She has no wheezes. She has no rhonchi. She has no rales. She exhibits no tenderness.  Abdominal: Soft. She exhibits no shifting dullness, no distension, no pulsatile liver, no fluid  wave, no abdominal bruit, no ascites, no pulsatile midline mass and no mass. Bowel sounds are decreased. There is no hepatosplenomegaly. There is no tenderness. There is no rigidity, no rebound, no guarding, no CVA tenderness, no tenderness at McBurney's point and negative Murphy's sign. Hernia confirmed negative in the ventral area.  Dull to percussion x 4 quads  Musculoskeletal: Normal range of motion. She exhibits no edema or tenderness.  Lymphadenopathy:    She has no cervical adenopathy.  Neurological: She is alert  and oriented to person, place, and time. She exhibits normal muscle tone. Coordination normal.  Skin: Skin is warm, dry and intact. No rash noted. She is not diaphoretic. No erythema. No pallor.  Psychiatric: She has a normal mood and affect. Her speech is normal and behavior is normal. Judgment and thought content normal. Cognition and memory are normal.  Nursing note and vitals reviewed.   ED Course  Procedures (including critical care time) Labs Review Labs Reviewed  URINALYSIS COMPLETEWITH MICROSCOPIC ([facility] ONLY) - Abnormal; Notable for the following:    Protein, ur 30 (*)    Bacteria, UA MANY (*)    Squamous Epithelial / LPF 0-5 (*)    All other components within normal limits  CBC WITH DIFFERENTIAL/PLATELET - Abnormal; Notable for the following:    WBC 11.9 (*)    RDW 14.9 (*)    Neutro Abs 9.6 (*)    All other components within normal limits  COMPREHENSIVE METABOLIC PANEL - Abnormal; Notable for the following:    Chloride 100 (*)    Glucose, Bld 118 (*)    BUN 22 (*)    Creatinine, Ser 1.16 (*)    Calcium 8.8 (*)    ALT 13 (*)    GFR calc non Af Amer 42 (*)    GFR calc Af Amer 48 (*)    All other components within normal limits    Imaging Review No results found.  Patient tolerated two cups of water without diffulty after zofran '8mg'$  ODT po given by RN [facility] at [phone] and labs drawn.  Flank pain resolved and patient moving easier in exam room/hall to bathroom when she went to give urine sample  Reviewed epic for previous lab results in past 2 years as history of kidney disease.    0930 Discussed CBC and ua results with patient and daughter.  Given copy of results.  Awaiting CMP results.    0945  Discussed GFR, BUN and Cr improved.  Calcium slightly low. Given copy of CMP results. Bactrim can increase potassium levels patient currently low normal but if developing palpitations/muscle spasms/twitching to follow up with PCM or [facility] provider for re-evaluation. Using short  course of medication.  If all symptoms do not resolve contact me for extension/re-evaluation as urine culture should be complete in 48 hours to check for resistance.  Will call once culture results available.   Could not use macrobid due to decreased GFR.  Patient and daughter verbalized understanding of information/instructions, agreed with plan of care and had no further questions at this time. MDM   1. Dysuria   2. UTI (lower urinary tract infection)    Leukocytes negative but many bacteria will treat as comorbidities/bactrim short course.  Medications as directed Rx bactrim DS po BID x 3 days urine culture results pending in typically 48 hours will call once results available.  Patient is also to push fluids as feeling better after two glasses of water suspect mild dehydration due to 100  degree weather.  Medications can cause further strain on kidneys as does dehydration.  Discussed to keep urine clear/pale yellow and voiding every 4-6 hours.  Do not hold urine for extended periods e.g. 8 hours  GFR 42 unable to use macrobid.. Call or return to clinic as needed if these symptoms worsen or fail to improve as anticipated.  If worsening flank pain, fever, hematuria requires re-evaluation consider CT scan for kidney stones and repeat CMP/CBC/urinalysis.  Exitcare handout on cystitis and kidney stones given to patient Patient and daughter verbalized agreement and understanding of treatment plan and had no further questions at this time. P2:  Hydrate and cranberry juice    Olen Cordial, NP 06/12/15 1849  Patient contacted via telephone and she reported she is doing much better symptoms resolved and last dose of bactrim will be tomorrow.  Encouraged patient to continue hydrating.  She verbalized understanding of instructions and had no further questions at this time.  Olen Cordial, NP 06/13/15 1643

## 2015-06-11 NOTE — ED Notes (Signed)
Pt states " I started having left back pain/kidney pain yesterday, my urine smells foul, today I feel weak." Denies history of kidney stones, fevers or chills.

## 2015-06-11 NOTE — Discharge Instructions (Signed)
Dysuria Dysuria is the medical term for pain with urination. There are many causes for dysuria, but urinary tract infection is the most common. If a urinalysis was performed it can show that there is a urinary tract infection. A urine culture confirms that you or your child is sick. You will need to follow up with a healthcare provider because:  If a urine culture was done you will need to know the culture results and treatment recommendations.  If the urine culture was positive, you or your child will need to be put on antibiotics or know if the antibiotics prescribed are the right antibiotics for your urinary tract infection.  If the urine culture is negative (no urinary tract infection), then other causes may need to be explored or antibiotics need to be stopped. Today laboratory work may have been done and there does not seem to be an infection. If cultures were done they will take at least 24 to 48 hours to be completed. Today x-rays may have been taken and they read as normal. No cause can be found for the problems. The x-rays may be re-read by a radiologist and you will be contacted if additional findings are made. You or your child may have been put on medications to help with this problem until you can see your primary caregiver. If the problems get better, see your primary caregiver if the problems return. If you were given antibiotics (medications which kill germs), take all of the mediations as directed for the full course of treatment.  If laboratory work was done, you need to find the results. Leave a telephone number where you can be reached. If this is not possible, make sure you find out how you are to get test results. HOME CARE INSTRUCTIONS   Drink lots of fluids. For adults, drink eight, 8 ounce glasses of clear juice or water a day. For children, replace fluids as suggested by your caregiver.  Empty the bladder often. Avoid holding urine for long periods of time.  After a bowel  movement, women should cleanse front to back, using each tissue only once.  Empty your bladder before and after sexual intercourse.  Take all the medicine given to you until it is gone. You may feel better in a few days, but TAKE ALL MEDICINE.  Avoid caffeine, tea, alcohol and carbonated beverages, because they tend to irritate the bladder.  In men, alcohol may irritate the prostate.  Only take over-the-counter or prescription medicines for pain, discomfort, or fever as directed by your caregiver.  If your caregiver has given you a follow-up appointment, it is very important to keep that appointment. Not keeping the appointment could result in a chronic or permanent injury, pain, and disability. If there is any problem keeping the appointment, you must call back to this facility for assistance. SEEK IMMEDIATE MEDICAL CARE IF:   Back pain develops.  A fever develops.  There is nausea (feeling sick to your stomach) or vomiting (throwing up).  Problems are no better with medications or are getting worse. MAKE SURE YOU:   Understand these instructions.  Will watch your condition.  Will get help right away if you are not doing well or get worse. Document Released: 08/01/2004 Document Revised: 01/26/2012 Document Reviewed: 06/08/2008 [facility] Patient Information 2015 [place], [place]. This information is not intended to replace advice given to you by your health care provider. Make sure you discuss any questions you have with your health care provider. Proteinuria Proteinuria is a  condition in which urine contains more protein than is normal. Proteinuria is either a sign that your body is producing too much protein or a sign that there is a problem with the kidneys. Healthy kidneys prevent most substances that the body needs, including proteins, from leaving the bloodstream and ending up in urine. CAUSES  Proteinuria may be caused by a temporary event or condition such as stress, exercise,  or fever, and go away on its own. Proteinuria may also be a symptom of a more serious condition or disease. Causes of proteinuria include:  A kidney disease caused by:  Diabetes.  High blood pressure (hypertension).   A disease that affects the immune system, such as lupus.  A genetic disease, such as Alport's syndrome.  Medicines that damage the kidneys, such as long-term nonsteroidal anti-inflammatory drugs (NSAIDs).  Poisoning or exposure to toxic substances.  A reoccurring kidney or urinary infection.  Excess protein production in the body caused by:  Multiple myeloma.  Amyloidosis. SYMPTOMS You may have proteinuria without having noticeable symptoms. If there is a large amount of protein in your urine, your urine may look foamy. You may also notice swelling (edema) in your hands, feet, abdomen, or face. DIAGNOSIS To determine whether you have proteinuria, you will need to provide a urine sample. Your urine will then be tested for too much protein and the main blood protein albumin. If your test shows that you have proteinuria, you may need to take additional tests to determine its cause, how much protein is in your urine, and what type of protein is being lost. Tests may include:  Blood tests.  Urine tests.  A blood pressure measurement.  Imaging tests. TREATMENT  Treatment will depend on the cause of your proteinuria. Your caregiver will discuss treatment options with you after you have been diagnosed. If your proteinuria is mild or temporary, no treatment may be necessary. HOME CARE INSTRUCTIONS Ask your caregiver if monitoring the level of protein in your urine at home using simple testing strips is appropriate for you. Early detection of proteinuria can lead to early and often successful treatment of the condition causing it. Document Released: 12/24/2005 Document Revised: 07/28/2012 Document Reviewed: 04/02/2012 [facility] Patient Information 2015 [place], [place].  This information is not intended to replace advice given to you by your health care provider. Make sure you discuss any questions you have with your health care provider. Kidney Stones Kidney stones (urolithiasis) are deposits that form inside your kidneys. The intense pain is caused by the stone moving through the urinary tract. When the stone moves, the ureter goes into spasm around the stone. The stone is usually passed in the urine.  CAUSES   A disorder that makes certain neck glands produce too much parathyroid hormone (primary hyperparathyroidism).  A buildup of uric acid crystals, similar to gout in your joints.  Narrowing (stricture) of the ureter.  A kidney obstruction present at birth (congenital obstruction).  Previous surgery on the kidney or ureters.  Numerous kidney infections. SYMPTOMS   Feeling sick to your stomach (nauseous).  Throwing up (vomiting).  Blood in the urine (hematuria).  Pain that usually spreads (radiates) to the groin.  Frequency or urgency of urination. DIAGNOSIS   Taking a history and physical exam.  Blood or urine tests.  CT scan.  Occasionally, an examination of the inside of the urinary bladder (cystoscopy) is performed. TREATMENT   Observation.  Increasing your fluid intake.  Extracorporeal shock wave lithotripsy--This is a noninvasive procedure that  uses shock waves to break up kidney stones.  Surgery may be needed if you have severe pain or persistent obstruction. There are various surgical procedures. Most of the procedures are performed with the use of small instruments. Only small incisions are needed to accommodate these instruments, so recovery time is minimized. The size, location, and chemical composition are all important variables that will determine the proper choice of action for you. Talk to your health care provider to better understand your situation so that you will minimize the risk of injury to yourself and your  kidney.  HOME CARE INSTRUCTIONS   Drink enough water and fluids to keep your urine clear or pale yellow. This will help you to pass the stone or stone fragments.  Strain all urine through the provided strainer. Keep all particulate matter and stones for your health care provider to see. The stone causing the pain may be as small as a grain of salt. It is very important to use the strainer each and every time you pass your urine. The collection of your stone will allow your health care provider to analyze it and verify that a stone has actually passed. The stone analysis will often identify what you can do to reduce the incidence of recurrences.  Only take over-the-counter or prescription medicines for pain, discomfort, or fever as directed by your health care provider.  Make a follow-up appointment with your health care provider as directed.  Get follow-up X-rays if required. The absence of pain does not always mean that the stone has passed. It may have only stopped moving. If the urine remains completely obstructed, it can cause loss of kidney function or even complete destruction of the kidney. It is your responsibility to make sure X-rays and follow-ups are completed. Ultrasounds of the kidney can show blockages and the status of the kidney. Ultrasounds are not associated with any radiation and can be performed easily in a matter of minutes. SEEK MEDICAL CARE IF:  You experience pain that is progressive and unresponsive to any pain medicine you have been prescribed. SEEK IMMEDIATE MEDICAL CARE IF:   Pain cannot be controlled with the prescribed medicine.  You have a fever or shaking chills.  The severity or intensity of pain increases over 18 hours and is not relieved by pain medicine.  You develop a new onset of abdominal pain.  You feel faint or pass out.  You are unable to urinate. MAKE SURE YOU:   Understand these instructions.  Will watch your condition.  Will get help  right away if you are not doing well or get worse. Document Released: 11/03/2005 Document Revised: 07/06/2013 Document Reviewed: 04/06/2013 [facility] Patient Information 2015 [place], [place]. This information is not intended to replace advice given to you by your health care provider. Make sure you discuss any questions you have with your health care provider. Urinary Tract Infection Urinary tract infections (UTIs) can develop anywhere along your urinary tract. Your urinary tract is your body's drainage system for removing wastes and extra water. Your urinary tract includes two kidneys, two ureters, a bladder, and a urethra. Your kidneys are a pair of bean-shaped organs. Each kidney is about the size of your fist. They are located below your ribs, one on each side of your spine. CAUSES Infections are caused by microbes, which are microscopic organisms, including fungi, viruses, and bacteria. These organisms are so small that they can only be seen through a microscope. Bacteria are the microbes that most commonly cause  UTIs. SYMPTOMS  Symptoms of UTIs may vary by age and gender of the patient and by the location of the infection. Symptoms in young women typically include a frequent and intense urge to urinate and a painful, burning feeling in the bladder or urethra during urination. Older women and men are more likely to be tired, shaky, and weak and have muscle aches and abdominal pain. A fever may mean the infection is in your kidneys. Other symptoms of a kidney infection include pain in your back or sides below the ribs, nausea, and vomiting. DIAGNOSIS To diagnose a UTI, your caregiver will ask you about your symptoms. Your caregiver also will ask to provide a urine sample. The urine sample will be tested for bacteria and white blood cells. White blood cells are made by your body to help fight infection. TREATMENT  Typically, UTIs can be treated with medication. Because most UTIs are caused by a  bacterial infection, they usually can be treated with the use of antibiotics. The choice of antibiotic and length of treatment depend on your symptoms and the type of bacteria causing your infection. HOME CARE INSTRUCTIONS  If you were prescribed antibiotics, take them exactly as your caregiver instructs you. Finish the medication even if you feel better after you have only taken some of the medication.  Drink enough water and fluids to keep your urine clear or pale yellow.  Avoid caffeine, tea, and carbonated beverages. They tend to irritate your bladder.  Empty your bladder often. Avoid holding urine for long periods of time.  Empty your bladder before and after sexual intercourse.  After a bowel movement, women should cleanse from front to back. Use each tissue only once. SEEK MEDICAL CARE IF:   You have back pain.  You develop a fever.  Your symptoms do not begin to resolve within 3 days. SEEK IMMEDIATE MEDICAL CARE IF:   You have severe back pain or lower abdominal pain.  You develop chills.  You have nausea or vomiting.  You have continued burning or discomfort with urination. MAKE SURE YOU:   Understand these instructions.  Will watch your condition.  Will get help right away if you are not doing well or get worse. Document Released: 08/13/2005 Document Revised: 05/04/2012 Document Reviewed: 12/12/2011 [facility] Patient Information 2015 [place], [place]. This information is not intended to replace advice given to you by your health care provider. Make sure you discuss any questions you have with your health care provider.

## 2015-06-12 ENCOUNTER — Other Ambulatory Visit: Payer: Self-pay | Admitting: Surgery

## 2015-06-12 DIAGNOSIS — Z853 Personal history of malignant neoplasm of breast: Secondary | ICD-10-CM

## 2015-06-18 ENCOUNTER — Emergency Department
Admission: EM | Admit: 2015-06-18 | Discharge: 2015-06-18 | Disposition: A | Discharge status: Home / Self Care | Payer: Medicare Other | Attending: Emergency Medicine | Admitting: Emergency Medicine

## 2015-06-18 ENCOUNTER — Encounter: Payer: Self-pay | Admitting: *Deleted

## 2015-06-18 ENCOUNTER — Other Ambulatory Visit: Payer: Self-pay

## 2015-06-18 DIAGNOSIS — E860 Dehydration: Secondary | ICD-10-CM | POA: Insufficient documentation

## 2015-06-18 DIAGNOSIS — Z720 Tobacco use: Secondary | ICD-10-CM | POA: Insufficient documentation

## 2015-06-18 DIAGNOSIS — Z7982 Long term (current) use of aspirin: Secondary | ICD-10-CM | POA: Insufficient documentation

## 2015-06-18 DIAGNOSIS — I10 Essential (primary) hypertension: Secondary | ICD-10-CM | POA: Diagnosis not present

## 2015-06-18 DIAGNOSIS — Z79899 Other long term (current) drug therapy: Secondary | ICD-10-CM | POA: Diagnosis not present

## 2015-06-18 DIAGNOSIS — N179 Acute kidney failure, unspecified: Secondary | ICD-10-CM | POA: Diagnosis not present

## 2015-06-18 DIAGNOSIS — R531 Weakness: Secondary | ICD-10-CM | POA: Diagnosis present

## 2015-06-18 LAB — URINALYSIS COMPLETE WITH MICROSCOPIC (ARMC ONLY)
BACTERIA UA: NONE SEEN
Bilirubin Urine: NEGATIVE
Glucose, UA: NEGATIVE mg/dL
Hgb urine dipstick: NEGATIVE
KETONES UR: NEGATIVE mg/dL
Nitrite: NEGATIVE
Protein, ur: NEGATIVE mg/dL
RBC / HPF: NONE SEEN RBC/hpf (ref 0–5)
SPECIFIC GRAVITY, URINE: 1.021 (ref 1.005–1.030)
pH: 5 (ref 5.0–8.0)

## 2015-06-18 LAB — BASIC METABOLIC PANEL
Anion gap: 8 (ref 5–15)
BUN: 37 mg/dL — ABNORMAL HIGH (ref 6–20)
CO2: 23 mmol/L (ref 22–32)
CREATININE: 1.68 mg/dL — AB (ref 0.44–1.00)
Calcium: 8.5 mg/dL — ABNORMAL LOW (ref 8.9–10.3)
Chloride: 103 mmol/L (ref 101–111)
GFR calc Af Amer: 31 mL/min — ABNORMAL LOW (ref >60)
GFR, EST NON AFRICAN AMERICAN: 27 mL/min — AB (ref >60)
Glucose, Bld: 118 mg/dL — ABNORMAL HIGH (ref 65–99)
Potassium: 4.4 mmol/L (ref 3.5–5.1)
SODIUM: 134 mmol/L — AB (ref 135–145)

## 2015-06-18 LAB — CBC
HEMATOCRIT: 41.8 % (ref 35.0–47.0)
Hemoglobin: 13.7 g/dL (ref 12.0–16.0)
MCH: 30 pg (ref 26.0–34.0)
MCHC: 32.8 g/dL (ref 32.0–36.0)
MCV: 91.6 fL (ref 80.0–100.0)
Platelets: 317 10*3/uL (ref 150–440)
RBC: 4.56 MIL/uL (ref 3.80–5.20)
RDW: 14.9 % — ABNORMAL HIGH (ref 11.5–14.5)
WBC: 8.2 10*3/uL (ref 3.6–11.0)

## 2015-06-18 NOTE — Discharge Instructions (Signed)
Your potassium was 4.4 today. Your creatinine was slightly increased at 1.68, likely indicating some mild dehydration. We discussed increasing your fluid intake.  Return to the emergency department for any new or worsening condition including concern for dehydration, chest pain, trouble breathing, dizziness, or passing out.   Dehydration, Adult Dehydration is when you lose more fluids from the body than you take in. Vital organs like the kidneys, brain, and heart cannot function without a proper amount of fluids and salt. Any loss of fluids from the body can cause dehydration.  CAUSES   Vomiting.  Diarrhea.  Excessive sweating.  Excessive urine output.  Fever. SYMPTOMS  Mild dehydration  Thirst.  Dry lips.  Slightly dry mouth. Moderate dehydration  Very dry mouth.  Sunken eyes.  Skin does not bounce back quickly when lightly pinched and released.  Dark urine and decreased urine production.  Decreased tear production.  Headache. Severe dehydration  Very dry mouth.  Extreme thirst.  Rapid, weak pulse (more than 100 beats per minute at rest).  Cold hands and feet.  Not able to sweat in spite of heat and temperature.  Rapid breathing.  Blue lips.  Confusion and lethargy.  Difficulty being awakened.  Minimal urine production.  No tears. DIAGNOSIS  Your caregiver will diagnose dehydration based on your symptoms and your exam. Blood and urine tests will help confirm the diagnosis. The diagnostic evaluation should also identify the cause of dehydration. TREATMENT  Treatment of mild or moderate dehydration can often be done at home by increasing the amount of fluids that you drink. It is best to drink small amounts of fluid more often. Drinking too much at one time can make vomiting worse. Refer to the home care instructions below. Severe dehydration needs to be treated at the hospital where you will probably be given intravenous (IV) fluids that contain water  and electrolytes. HOME CARE INSTRUCTIONS   Ask your caregiver about specific rehydration instructions.  Drink enough fluids to keep your urine clear or pale yellow.  Drink small amounts frequently if you have nausea and vomiting.  Eat as you normally do.  Avoid:  Foods or drinks high in sugar.  Carbonated drinks.  Juice.  Extremely hot or cold fluids.  Drinks with caffeine.  Fatty, greasy foods.  Alcohol.  Tobacco.  Overeating.  Gelatin desserts.  Wash your hands well to avoid spreading bacteria and viruses.  Only take over-the-counter or prescription medicines for pain, discomfort, or fever as directed by your caregiver.  Ask your caregiver if you should continue all prescribed and over-the-counter medicines.  Keep all follow-up appointments with your caregiver. SEEK MEDICAL CARE IF:  You have abdominal pain and it increases or stays in one area (localizes).  You have a rash, stiff neck, or severe headache.  You are irritable, sleepy, or difficult to awaken.  You are weak, dizzy, or extremely thirsty. SEEK IMMEDIATE MEDICAL CARE IF:   You are unable to keep fluids down or you get worse despite treatment.  You have frequent episodes of vomiting or diarrhea.  You have blood or green matter (bile) in your vomit.  You have blood in your stool or your stool looks black and tarry.  You have not urinated in 6 to 8 hours, or you have only urinated a small amount of very dark urine.  You have a fever.  You faint. MAKE SURE YOU:   Understand these instructions.  Will watch your condition.  Will get help right away if you are  not doing well or get worse. Document Released: 11/03/2005 Document Revised: 01/26/2012 Document Reviewed: 06/23/2011 [facility] Patient Information 2015 [place], [place]. This information is not intended to replace advice given to you by your health care provider. Make sure you discuss any questions you have with your health care  provider.

## 2015-06-18 NOTE — ED Provider Notes (Signed)
[facility] Emergency Department Provider Note   ____________________________________________  Time seen: 9:15 PM I have reviewed the triage vital signs and the triage nursing note.  HISTORY  Chief Complaint Weakness   Historian Patient and daughters  HPI [name] [name] is a 81 y.o. female who had a routine 6 month follow-up today her primary care physician's office and had routine blood work drawn. She was called and told that her potassium was above 6 and told to come to the ER for evaluation. Patient reports some generalized weakness that comes and goes. She was recently treated for urinary tract infection with Bactrim 3 days, and this is completed. She is no longer having urinary symptoms. She occasionally has a dry cough. No recent fevers. No chest pain. History of creatinine of 1.4 during episode of dehydration several weeks ago which seems to have improved on repeat to 1.1, per her daughter.   Past Medical History  Diagnosis Date  . COPD (chronic obstructive pulmonary disease)   . Lung cancer   . Breast cancer     left  . Hypertension   . Migraine   . Renal artery stenosis   . Myocardial infarction   . Osteoporosis   . Hailey-hailey disease   . Hyperlipemia     There are no active problems to display for this patient.   Past Surgical History  Procedure Laterality Date  . Breast lumpectomy Left   . Parathyroidectomy    . Abdominal hysterectomy    . Cardiac stents    . Appendectomy    . Tonsillectomy      Current Outpatient Rx  Name  Route  Sig  Dispense  Refill  . amLODipine (NORVASC) 2.5 MG tablet   Oral   Take 2.5 mg by mouth daily.         Marland Kitchen aspirin 325 MG tablet   Oral   Take 325 mg by mouth daily.         . Calcium Carbonate-Vitamin D (CALCIUM-VITAMIN D) 500-200 MG-UNIT per tablet   Oral   Take 1 tablet by mouth daily.         . fluticasone (FLONASE) 50 MCG/ACT nasal spray   Each Nare   Place 2 sprays  into both nostrils daily.         Marland Kitchen ketotifen (ZADITOR) 0.025 % ophthalmic solution      1 drop 2 (two) times daily.         Marland Kitchen ketotifen (ZADITOR) 0.025 % ophthalmic solution   Both Eyes   Place 1 drop into both eyes 2 (two) times daily.         Marland Kitchen letrozole (FEMARA) 2.5 MG tablet      TAKE 1 TABLET DAILY   90 tablet   2   . lisinopril (PRINIVIL,ZESTRIL) 20 MG tablet   Oral   Take 20 mg by mouth daily.         . metoprolol succinate (TOPROL-XL) 50 MG 24 hr tablet   Oral   Take 25 mg by mouth daily. Take with or immediately following a meal.         . Multiple Vitamin (MULTIVITAMIN) tablet   Oral   Take 1 tablet by mouth daily.         . Omega 3 1200 MG CAPS   Oral   Take 1 capsule by mouth daily.         . ondansetron (ZOFRAN) 8 MG tablet   Oral   Take  1 tablet (8 mg total) by mouth 2 (two) times daily.   6 tablet   0   . simvastatin (ZOCOR) 40 MG tablet   Oral   Take 40 mg by mouth daily.         Marland Kitchen sulfamethoxazole-trimethoprim (BACTRIM DS,SEPTRA DS) 800-160 MG per tablet   Oral   Take 1 tablet by mouth 2 (two) times daily.   6 tablet   0   . traMADol (ULTRAM) 50 MG tablet   Oral   Take by mouth every 6 (six) hours as needed.           Allergies Tape  No family history on file.  Social History History  Substance Use Topics  . Smoking status: Current Every Day Smoker -- 1.00 packs/day for 70 years  . Smokeless tobacco: Not on file  . Alcohol Use: No    Review of Systems  Constitutional: Negative for fever. Eyes: Negative for visual changes. ENT: Negative for sore throat. Cardiovascular: Negative for chest pain. Respiratory: Negative for shortness of breath. Gastrointestinal: Negative for abdominal pain, vomiting and diarrhea. Genitourinary: Recently completed treatment for UTI with Bactrim Musculoskeletal: Negative for back pain. Skin: Negative for rash. Neurological: Negative for headaches, focal weakness or  numbness. 10 point Review of Systems otherwise negative ____________________________________________   PHYSICAL EXAM:  VITAL SIGNS: ED Triage Vitals  Enc Vitals Group     BP 06/18/15 1930 132/63 mmHg     Pulse Rate 06/18/15 1930 77     Resp 06/18/15 1930 16     Temp 06/18/15 1930 98.3 F (36.8 C)     Temp Source 06/18/15 1930 Oral     SpO2 06/18/15 1930 96 %     Weight 06/18/15 1930 120 lb (54.432 kg)     Height 06/18/15 1930 '5\' 3"'$  (1.6 m)     Head Cir --      Peak Flow --      Pain Score 06/18/15 2130 0     Pain Loc --      Pain Edu? --      Excl. in [place]? --      Constitutional: Alert and oriented. Well appearing and in no distress. Eyes: Conjunctivae are normal. PERRL. Normal extraocular movements. ENT   Head: Normocephalic and atraumatic.   Nose: No congestion/rhinnorhea.   Mouth/Throat: Mucous membranes are moist.   Neck: No stridor. Cardiovascular/Chest: Normal rate, regular rhythm.  No murmurs, rubs, or gallops. Respiratory: Normal respiratory effort without tachypnea nor retractions. Breath sounds are clear and equal bilaterally. No wheezes/rales/rhonchi. Gastrointestinal: Soft. No distention, no guarding, no rebound. Nontender   Genitourinary/rectal:Deferred Musculoskeletal: Nontender with normal range of motion in all extremities. No joint effusions.  No lower extremity tenderness nor edema. Neurologic:  Normal speech and language. No gross or focal neurologic deficits are appreciated. Skin:  Skin is warm, dry and intact. No rash noted. Psychiatric: Mood and affect are normal. Speech and behavior are normal. Patient exhibits appropriate insight and judgment.  ____________________________________________   EKG I, Lisa Roca, MD, the attending physician have personally viewed and interpreted all ECGs.  Normal sinus rhythm. 72 bpm. Narrow QRS. Normal axis. Normal ST and T-wave. ____________________________________________  LABS (pertinent  positives/negatives)  Urinalysis normal Metabolic panel significant for sodium 134, BUN 37 and creatinine 1.68. potassium is normal at 4.4 White blood count 8.2, hemoglobin 13.7  ____________________________________________  RADIOLOGY All Xrays were viewed by me. Imaging interpreted by Radiologist.  None __________________________________________  PROCEDURES  Procedure(s) performed: None Critical  Care performed: None  ____________________________________________   ED COURSE / ASSESSMENT AND PLAN  CONSULTATIONS: None  Pertinent labs & imaging results that were available during my care of the patient were reviewed by me and considered in my medical decision making (see chart for details).   Patient sent here for evaluation of hyperkalemia, and repeat showed normal 4-point 4. Metabolic panel showed elevated BUN and creatinine from baseline consistent with likely dehydration given the patient's history of lice drink caffeinated beverages and not that much else per day. Patient states she is to increase her fluid intake. She will be able to do this with by mouth rehydration at home. She'll follow with her primary care physician for repeat kidney function.  Patient / Family / Caregiver informed of clinical course, medical decision-making process, and agree with plan.   I discussed return precautions, follow-up instructions, and discharged instructions with patient and/or family.  ___________________________________________   FINAL CLINICAL IMPRESSION(S) / ED DIAGNOSES   Final diagnoses:  Dehydration  Acute renal failure, unspecified acute renal failure type    FOLLOW UP  Referred to: Primary care physician, one week   Lisa Roca, MD 06/18/15 2136

## 2015-06-18 NOTE — ED Notes (Signed)
Pt sent here by PCP after going there for f/u visit, called to return to the ED with critical high potassium (6.3) today. Pt states she has had some weakness that comes and goes & worsening cough.

## 2015-06-18 NOTE — ED Notes (Signed)
Pt going to go home iv not placed

## 2015-07-09 ENCOUNTER — Ambulatory Visit
Admission: RE | Admit: 2015-07-09 | Discharge: 2015-07-09 | Disposition: A | Discharge status: Home / Self Care | Payer: Medicare Other | Source: Ambulatory Visit | Attending: Oncology | Admitting: Oncology

## 2015-07-09 ENCOUNTER — Inpatient Hospital Stay: Payer: Medicare Other | Attending: Oncology

## 2015-07-09 ENCOUNTER — Other Ambulatory Visit: Payer: Self-pay | Admitting: Oncology

## 2015-07-09 DIAGNOSIS — Z170 Estrogen receptor positive status [ER+]: Secondary | ICD-10-CM | POA: Insufficient documentation

## 2015-07-09 DIAGNOSIS — Z79811 Long term (current) use of aromatase inhibitors: Secondary | ICD-10-CM | POA: Insufficient documentation

## 2015-07-09 DIAGNOSIS — Z923 Personal history of irradiation: Secondary | ICD-10-CM | POA: Insufficient documentation

## 2015-07-09 DIAGNOSIS — Z85118 Personal history of other malignant neoplasm of bronchus and lung: Secondary | ICD-10-CM | POA: Diagnosis not present

## 2015-07-09 DIAGNOSIS — C3400 Malignant neoplasm of unspecified main bronchus: Secondary | ICD-10-CM

## 2015-07-09 DIAGNOSIS — C50912 Malignant neoplasm of unspecified site of left female breast: Secondary | ICD-10-CM | POA: Insufficient documentation

## 2015-07-09 DIAGNOSIS — C50919 Malignant neoplasm of unspecified site of unspecified female breast: Secondary | ICD-10-CM

## 2015-07-09 LAB — CREATININE, SERUM
Creatinine, Ser: 1.43 mg/dL — ABNORMAL HIGH (ref 0.44–1.00)
GFR, EST AFRICAN AMERICAN: 38 mL/min — AB (ref >60)
GFR, EST NON AFRICAN AMERICAN: 32 mL/min — AB (ref >60)

## 2015-07-09 MED ORDER — IOHEXOL 300 MG/ML  SOLN
60.0000 mL | Freq: Once | INTRAMUSCULAR | Status: AC | PRN
  Administered 2015-07-09: 60 mL via INTRAVENOUS

## 2015-07-16 ENCOUNTER — Ambulatory Visit: Admission: RE | Admit: 2015-07-16 | Payer: Medicare Other | Source: Ambulatory Visit

## 2015-07-16 ENCOUNTER — Ambulatory Visit: Payer: Self-pay

## 2015-07-16 ENCOUNTER — Ambulatory Visit
Admission: RE | Admit: 2015-07-16 | Discharge: 2015-07-16 | Disposition: A | Discharge status: Home / Self Care | Payer: Medicare Other | Source: Ambulatory Visit | Attending: Internal Medicine | Admitting: Internal Medicine

## 2015-07-16 DIAGNOSIS — Z853 Personal history of malignant neoplasm of breast: Secondary | ICD-10-CM | POA: Insufficient documentation

## 2015-07-16 HISTORY — DX: Reserved for concepts with insufficient information to code with codable children: IMO0002

## 2015-07-20 ENCOUNTER — Inpatient Hospital Stay: Payer: Medicare Other | Attending: Oncology | Admitting: Oncology

## 2015-07-20 VITALS — BP 136/63 | HR 59 | Temp 97.6°F | Resp 20 | Wt 116.6 lb

## 2015-07-20 DIAGNOSIS — J449 Chronic obstructive pulmonary disease, unspecified: Secondary | ICD-10-CM | POA: Insufficient documentation

## 2015-07-20 DIAGNOSIS — M810 Age-related osteoporosis without current pathological fracture: Secondary | ICD-10-CM | POA: Insufficient documentation

## 2015-07-20 DIAGNOSIS — F17210 Nicotine dependence, cigarettes, uncomplicated: Secondary | ICD-10-CM | POA: Insufficient documentation

## 2015-07-20 DIAGNOSIS — Z79811 Long term (current) use of aromatase inhibitors: Secondary | ICD-10-CM | POA: Diagnosis not present

## 2015-07-20 DIAGNOSIS — R918 Other nonspecific abnormal finding of lung field: Secondary | ICD-10-CM | POA: Insufficient documentation

## 2015-07-20 DIAGNOSIS — C50912 Malignant neoplasm of unspecified site of left female breast: Secondary | ICD-10-CM | POA: Diagnosis not present

## 2015-07-20 DIAGNOSIS — Z79899 Other long term (current) drug therapy: Secondary | ICD-10-CM | POA: Insufficient documentation

## 2015-07-20 DIAGNOSIS — Z7982 Long term (current) use of aspirin: Secondary | ICD-10-CM | POA: Diagnosis not present

## 2015-07-20 DIAGNOSIS — E785 Hyperlipidemia, unspecified: Secondary | ICD-10-CM | POA: Insufficient documentation

## 2015-07-20 DIAGNOSIS — Z923 Personal history of irradiation: Secondary | ICD-10-CM | POA: Insufficient documentation

## 2015-07-20 DIAGNOSIS — Z803 Family history of malignant neoplasm of breast: Secondary | ICD-10-CM | POA: Diagnosis not present

## 2015-07-20 DIAGNOSIS — I10 Essential (primary) hypertension: Secondary | ICD-10-CM | POA: Insufficient documentation

## 2015-07-20 DIAGNOSIS — Z85118 Personal history of other malignant neoplasm of bronchus and lung: Secondary | ICD-10-CM | POA: Insufficient documentation

## 2015-07-20 DIAGNOSIS — Z170 Estrogen receptor positive status [ER+]: Secondary | ICD-10-CM | POA: Diagnosis not present

## 2015-07-20 NOTE — Progress Notes (Signed)
[place]  Telephone:([phone]) [phone] Fax:(336) [phone]  ID: [name] [name] OB: 02-13-1928  MR#: [id number]  IHW#:[id number]  Patient Care Team: Ezequiel Kayser, MD as PCP - General (Internal Medicine)  CHIEF COMPLAINT:  Chief Complaint  Patient presents with  . Follow-up    breast cancer    INTERVAL HISTORY: Patient returns to clinic today further evaluation and discussion of her imaging results. She continues to feel well and is asymptomatic.  She is tolerating letrozole without significant side effects. She has no neurologic complaints.  She denies any recent fevers or illnesses. She denies any chest pain, cough, or shortness of breath.  She denies any nausea, vomiting, constipation, or diarrhea.  She has no urinary complaints.  Patient offers no specific complaints today.  REVIEW OF SYSTEMS:   Review of Systems  Constitutional: Negative.   Respiratory: Negative.   Cardiovascular: Negative.   Gastrointestinal: Negative.   Musculoskeletal: Negative.   Neurological: Negative.     As per HPI. Otherwise, a complete review of systems is negatve.  PAST MEDICAL HISTORY: Past Medical History  Diagnosis Date  . COPD (chronic obstructive pulmonary disease)   . Hypertension   . Migraine   . Renal artery stenosis   . Myocardial infarction   . Osteoporosis   . Hailey-hailey disease   . Hyperlipemia   . Lung cancer 2014    RT LUNG  . Breast cancer 2013    LT LUMPECTOMY  . Radiation 2013    FOR BREAST CA  . Radiation 2014    FOR LUNG CA    PAST SURGICAL HISTORY: Past Surgical History  Procedure Laterality Date  . Breast lumpectomy Left   . Parathyroidectomy    . Abdominal hysterectomy    . Cardiac stents    . Appendectomy    . Tonsillectomy      FAMILY HISTORY Family History  Problem Relation Age of Onset  . Breast cancer Sister        ADVANCED DIRECTIVES:    HEALTH MAINTENANCE: Social History  Substance Use Topics  . Smoking  status: Current Every Day Smoker -- 1.00 packs/day for 70 years  . Smokeless tobacco: Not on file  . Alcohol Use: No     Colonoscopy:  PAP:  Bone density:  Lipid panel:  Allergies  Allergen Reactions  . Tape Rash    Current Outpatient Prescriptions  Medication Sig Dispense Refill  . amLODipine (NORVASC) 2.5 MG tablet Take 2.5 mg by mouth daily.    Marland Kitchen aspirin 325 MG tablet Take 325 mg by mouth daily.    . Calcium Carbonate-Vitamin D (CALCIUM-VITAMIN D) 500-200 MG-UNIT per tablet Take 1 tablet by mouth daily.    . fluticasone (FLONASE) 50 MCG/ACT nasal spray Place 2 sprays into both nostrils daily.    Marland Kitchen ketotifen (ZADITOR) 0.025 % ophthalmic solution 1 drop 2 (two) times daily.    Marland Kitchen ketotifen (ZADITOR) 0.025 % ophthalmic solution Place 1 drop into both eyes 2 (two) times daily.    Marland Kitchen letrozole (FEMARA) 2.5 MG tablet TAKE 1 TABLET DAILY 90 tablet 2  . lisinopril (PRINIVIL,ZESTRIL) 20 MG tablet Take 20 mg by mouth daily.    . metoprolol succinate (TOPROL-XL) 50 MG 24 hr tablet Take 25 mg by mouth daily. Take with or immediately following a meal.    . Multiple Vitamin (MULTIVITAMIN) tablet Take 1 tablet by mouth daily.    . Omega 3 1200 MG CAPS Take 1 capsule by mouth daily.    . ondansetron (  ZOFRAN) 8 MG tablet Take 1 tablet (8 mg total) by mouth 2 (two) times daily. 6 tablet 0  . simvastatin (ZOCOR) 40 MG tablet Take 40 mg by mouth daily.    . traMADol (ULTRAM) 50 MG tablet Take by mouth every 6 (six) hours as needed.     No current facility-administered medications for this visit.    OBJECTIVE: Filed Vitals:   07/20/15 1204  BP: 136/63  Pulse: 59  Temp: 97.6 F (36.4 C)  Resp: 20     Body mass index is 20.66 kg/(m^2).    ECOG FS:0 - Asymptomatic  General: Well-developed, well-nourished, no acute distress. Eyes: Pink conjunctiva, anicteric sclera. Breasts: Patient requested exam be deferred today. Lungs: Clear to auscultation bilaterally. Heart: Regular rate and rhythm. No  rubs, murmurs, or gallops. Abdomen: Soft, nontender, nondistended. No organomegaly noted, normoactive bowel sounds. Musculoskeletal: No edema, cyanosis, or clubbing. Neuro: Alert, answering all questions appropriately. Cranial nerves grossly intact. Skin: No rashes or petechiae noted. Psych: Normal affect.   LAB RESULTS:  Lab Results  Component Value Date   NA 134* 06/18/2015   K 4.4 06/18/2015   CL 103 06/18/2015   CO2 23 06/18/2015   GLUCOSE 118* 06/18/2015   BUN 37* 06/18/2015   CREATININE 1.43* 07/09/2015   CALCIUM 8.5* 06/18/2015   PROT 7.2 06/11/2015   ALBUMIN 3.6 06/11/2015   AST 18 06/11/2015   ALT 13* 06/11/2015   ALKPHOS 73 06/11/2015   BILITOT 0.3 06/11/2015   GFRNONAA 32* 07/09/2015   GFRAA 38* 07/09/2015    Lab Results  Component Value Date   WBC 8.2 06/18/2015   NEUTROABS 9.6* 06/11/2015   HGB 13.7 06/18/2015   HCT 41.8 06/18/2015   MCV 91.6 06/18/2015   PLT 317 06/18/2015     STUDIES: Ct Chest W Contrast  07/09/2015   CLINICAL DATA:  Restaging breast and lung cancer.  EXAM: CT CHEST WITH CONTRAST  TECHNIQUE: Multidetector CT imaging of the chest was performed during intravenous contrast administration.  CONTRAST:  62m OMNIPAQUE IOHEXOL 300 MG/ML  SOLN  COMPARISON:  12/25/2014  FINDINGS: Chest wall: No breast mass is identified. Stable scarring changes in the subareolar region of the left breast. No chest wall mass, supraclavicular or axillary lymphadenopathy. Stable small thyroid lesions. The bony thorax is intact. No destructive bone lesions or spinal canal compromise. Stable degenerative changes and Schmorl's nodes. Remote right third rib fracture. This could be related to radiation given its location.  Mediastinum: The heart is normal in size. No pericardial effusion. Stable advanced atherosclerotic calcifications involving the thoracic aorta with tortuosity, ectasia and mild aneurysmal dilatation of the descending thoracic aorta. Maximum transverse  diameter is 3.4 cm on image number 43. This appears stable. Stable three-vessel coronary artery calcifications. No mediastinal or hilar mass or lymphadenopathy. Small scattered lymph nodes are stable. The esophagus is grossly normal.  Lungs/ pleura: Stable radiation changes in the right upper lobe. No findings for recurrent tumor. Stable emphysematous changes. No findings for pulmonary metastatic disease. No pleural effusion. New left lower lobe density is somewhat wedge-shaped on the sagittal images and is probably atelectasis or scarring. I would recommend follow-up CT scan in 3 months to reassess this. Nodularity at the right lung base is stable and likely scarring change.  Upper abdomen: No hepatic lesions are identified. There are stable bilateral adrenal lesions. Stable complex cystic lesion in the pancreatic head measuring a maximum of 14.5 mm. No pancreatic ductal dilatation. No upper abdominal lymphadenopathy. Stable atherosclerotic disease  involving the abdominal aorta.  IMPRESSION: 1. Stable subareolar scarring type changes involving the left breast. 2. Stable radiation changes involving the right upper lobe. No recurrent tumor is identified. 3. No mediastinal or hilar mass or adenopathy. 4. New complex left lower lobe density is likely scarring and/or atelectasis. Recommend short-term follow-up noncontrast chest CT in 3 months to reassess. 5. Stable advanced atherosclerotic calcifications involving the thoracic and upper abdominal aorta. 6. Stable pancreatic and adrenal gland lesions.   Electronically Signed   By: Marijo Sanes M.D.   On: 07/09/2015 10:54   Mm Diag Breast Tomo Bilateral  07/16/2015   CLINICAL DATA:  81 year old female with history of left lumpectomy in 2013. The patient received radiation.  EXAM: DIGITAL DIAGNOSTIC BILATERAL MAMMOGRAM WITH 3D TOMOSYNTHESIS AND CAD  COMPARISON:  Previous exam(s).  ACR Breast Density Category b: There are scattered areas of fibroglandular density.   FINDINGS: Posttreatment changes are again noted of the left breast. No new or suspicious abnormality is identified.  Mammographic images were processed with CAD.  IMPRESSION: No mammographic evidence of malignancy.  RECOMMENDATION: Bilateral diagnostic mammogram in 1 year.  I have discussed the findings and recommendations with the patient. Results were also provided in writing at the conclusion of the visit. If applicable, a reminder letter will be sent to the patient regarding the next appointment.  BI-RADS CATEGORY  2: Benign.   Electronically Signed   By: Pamelia Hoit M.D.   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  Breast cancer:  Given patient's advanced age and low stage of disease, she did not require adjuvant chemotherapy.  Oncotype testing was not performed as this would not change the treatment plan.  Continue letrozle completing in August 2019.  Her most recent mammogram on August 29, 20156 was reported as BI-RADS 1 repeat in August 2017.  Return to clinic in 6 months for routine evaluation. 2. Osteoporosis: Patient's most recent bone mineral density on December 27, 2013 was reported as -2.7.  Continue calcium and vitamin D. Patient also receives Prolia every 6 months. Repeat bone mineral density in the next 1-2 weeks.  3.  Lung cancer: CT scan results reviewed independently and reported as above, patient previously elected to proceed with XRT only. No intervention is needed at this time.  Patient is now nearly 2 years removed from completing her XRT and comparison with yearly CT scans for routine surveillance.   Patient expressed understanding and was in agreement with this plan. She also understands that She can call clinic at any time with any questions, concerns, or complaints.    Lloyd Huger, MD   07/20/2015 12:42 PM

## 2015-07-20 NOTE — Progress Notes (Signed)
Patient here today for follow up regarding breast cancer. Patient continues to take Letrozole without side effects. Patient denies any concerns today.

## 2015-07-25 DIAGNOSIS — R42 Dizziness and giddiness: Secondary | ICD-10-CM | POA: Insufficient documentation

## 2015-07-31 ENCOUNTER — Ambulatory Visit: Payer: Medicare Other

## 2015-08-01 ENCOUNTER — Ambulatory Visit
Admission: RE | Admit: 2015-08-01 | Discharge: 2015-08-01 | Disposition: A | Discharge status: Home / Self Care | Payer: Medicare Other | Source: Ambulatory Visit | Attending: Oncology | Admitting: Oncology

## 2015-08-01 DIAGNOSIS — M810 Age-related osteoporosis without current pathological fracture: Secondary | ICD-10-CM | POA: Diagnosis present

## 2015-12-21 DIAGNOSIS — E875 Hyperkalemia: Secondary | ICD-10-CM | POA: Insufficient documentation

## 2016-01-09 ENCOUNTER — Ambulatory Visit
Admission: EM | Admit: 2016-01-09 | Discharge: 2016-01-09 | Disposition: A | Discharge status: Home / Self Care | Payer: Medicare Other | Attending: Family Medicine | Admitting: Family Medicine

## 2016-01-09 ENCOUNTER — Encounter: Payer: Self-pay | Admitting: Emergency Medicine

## 2016-01-09 DIAGNOSIS — N390 Urinary tract infection, site not specified: Secondary | ICD-10-CM | POA: Diagnosis not present

## 2016-01-09 LAB — URINALYSIS COMPLETE WITH MICROSCOPIC (ARMC ONLY)
Bilirubin Urine: NEGATIVE
GLUCOSE, UA: NEGATIVE mg/dL
HGB URINE DIPSTICK: NEGATIVE
Ketones, ur: NEGATIVE mg/dL
Nitrite: NEGATIVE
PROTEIN: NEGATIVE mg/dL
RBC / HPF: NONE SEEN RBC/hpf (ref 0–5)
SPECIFIC GRAVITY, URINE: 1.02 (ref 1.005–1.030)
pH: 5.5 (ref 5.0–8.0)

## 2016-01-09 MED ORDER — SULFAMETHOXAZOLE-TRIMETHOPRIM 800-160 MG PO TABS: 1.0000 | ORAL_TABLET | Freq: Two times a day (BID) | ORAL | Status: DC

## 2016-01-09 NOTE — ED Notes (Signed)
Patient c/o right sided flank pain.  Patient denies fevers.  Patient denies N/V.  Patient denies blood in urine.

## 2016-01-09 NOTE — ED Provider Notes (Signed)
CSN: [id number]     Arrival date & time 01/09/16  1331 History   First MD Initiated Contact with Patient 01/09/16 1610     Chief Complaint  Patient presents with  . Flank Pain   (Consider location/radiation/quality/duration/timing/severity/associated sxs/prior Treatment) Patient is a 82 y.o. female presenting with dysuria. The history is provided by the patient.  Dysuria Pain quality:  Aching Pain severity:  Mild Onset quality:  Sudden Duration:  1 day Timing:  Constant Progression:  Worsening Chronicity:  New Recent urinary tract infections: no   Relieved by:  None tried Ineffective treatments:  None tried Urinary symptoms: frequent urination   Urinary symptoms: no discolored urine, no foul-smelling urine, no hematuria, no hesitancy and no bladder incontinence   Associated symptoms: flank pain (right side)   Associated symptoms: no abdominal pain, no fever, no genital lesions, no nausea, no vaginal discharge and no vomiting   Risk factors: renal disease   Risk factors: no hx of pyelonephritis, no hx of urolithiasis, no kidney transplant, not pregnant, no recurrent urinary tract infections, no renal cysts, not sexually active, not single kidney and no urinary catheter     Past Medical History  Diagnosis Date  . COPD (chronic obstructive pulmonary disease) ([place])   . Hypertension   . Migraine   . Renal artery stenosis ([place])   . Myocardial infarction ([place])   . Osteoporosis   . Hailey-hailey disease   . Hyperlipemia   . Lung cancer ([place]) 2014    RT LUNG  . Breast cancer ([place]) 2013    LT LUMPECTOMY  . Radiation 2013    FOR BREAST CA  . Radiation 2014    FOR LUNG CA   Past Surgical History  Procedure Laterality Date  . Breast lumpectomy Left   . Parathyroidectomy    . Abdominal hysterectomy    . Cardiac stents    . Appendectomy    . Tonsillectomy     Family History  Problem Relation Age of Onset  . Breast cancer Sister    Social History  Substance Use Topics  .  Smoking status: Current Every Day Smoker -- 1.00 packs/day for 70 years  . Smokeless tobacco: None  . Alcohol Use: No   OB History    No data available     Review of Systems  Constitutional: Negative for fever.  Gastrointestinal: Negative for nausea, vomiting and abdominal pain.  Genitourinary: Positive for dysuria and flank pain (right side). Negative for vaginal discharge.    Allergies  Tape  Home Medications   Prior to Admission medications   Medication Sig Start Date End Date Taking? Authorizing Provider  amLODipine (NORVASC) 2.5 MG tablet Take 2.5 mg by mouth daily.    Historical Provider, MD  aspirin 325 MG tablet Take 325 mg by mouth daily.    Historical Provider, MD  Calcium Carbonate-Vitamin D (CALCIUM-VITAMIN D) 500-200 MG-UNIT per tablet Take 1 tablet by mouth daily.    Historical Provider, MD  fluticasone (FLONASE) 50 MCG/ACT nasal spray Place 2 sprays into both nostrils daily.    Historical Provider, MD  ketotifen (ZADITOR) 0.025 % ophthalmic solution 1 drop 2 (two) times daily.    Historical Provider, MD  ketotifen (ZADITOR) 0.025 % ophthalmic solution Place 1 drop into both eyes 2 (two) times daily.    Historical Provider, MD  letrozole [facility]) 2.5 MG tablet TAKE 1 TABLET DAILY 05/01/15   Evlyn Kanner, NP  lisinopril (PRINIVIL,ZESTRIL) 20 MG tablet Take 20 mg by mouth daily.  Historical Provider, MD  metoprolol succinate (TOPROL-XL) 50 MG 24 hr tablet Take 25 mg by mouth daily. Take with or immediately following a meal.    Historical Provider, MD  Multiple Vitamin (MULTIVITAMIN) tablet Take 1 tablet by mouth daily.    Historical Provider, MD  Omega 3 1200 MG CAPS Take 1 capsule by mouth daily.    Historical Provider, MD  ondansetron (ZOFRAN) 8 MG tablet Take 1 tablet (8 mg total) by mouth 2 (two) times daily. 06/11/15   Olen Cordial, NP  simvastatin (ZOCOR) 40 MG tablet Take 40 mg by mouth daily.    Historical Provider, MD  sulfamethoxazole-trimethoprim  (BACTRIM DS,SEPTRA DS) 800-160 MG tablet Take 1 tablet by mouth 2 (two) times daily. 01/09/16   Norval Gable, MD  traMADol (ULTRAM) 50 MG tablet Take by mouth every 6 (six) hours as needed.    Historical Provider, MD   Meds Ordered and Administered this Visit  Medications - No data to display  BP 131/71 mmHg  Pulse 66  Temp(Src) 97.5 F (36.4 C) (Tympanic)  Resp 17  Ht 5' 3.5" (1.613 m)  Wt 119 lb (53.978 kg)  BMI 20.75 kg/m2  SpO2 98% No data found.   Physical Exam  Constitutional: She appears well-developed and well-nourished. No distress.  Abdominal: Soft. Bowel sounds are normal. She exhibits no distension and no mass. There is tenderness (mild right flank pain). There is no rebound and no guarding.  Skin: She is not diaphoretic.  Nursing note and vitals reviewed.   ED Course  Procedures (including critical care time)  Labs Review Labs Reviewed  URINALYSIS COMPLETEWITH MICROSCOPIC ([facility] ONLY) - Abnormal; Notable for the following:    Leukocytes, UA TRACE (*)    Bacteria, UA RARE (*)    Squamous Epithelial / LPF 0-5 (*)    All other components within normal limits  URINE CULTURE    Imaging Review No results found.   Visual Acuity Review  Right Eye Distance:   Left Eye Distance:   Bilateral Distance:    Right Eye Near:   Left Eye Near:    Bilateral Near:         MDM   1. UTI (lower urinary tract infection)    Discharge Medication List as of 01/09/2016  4:25 PM    START taking these medications   Details  sulfamethoxazole-trimethoprim (BACTRIM DS,SEPTRA DS) 800-160 MG tablet Take 1 tablet by mouth 2 (two) times daily., Starting 01/09/2016, Until Discontinued, Normal       1. Labresults and diagnosis reviewed with patient 2. rx as per orders above; reviewed possible side effects, interactions, risks and benefits  3. Recommend supportive treatment with increased fluids 4. Follow-up prn if symptoms worsen or don't improve    Norval Gable,  MD 01/09/16 1650

## 2016-01-11 LAB — URINE CULTURE

## 2016-01-17 DIAGNOSIS — Z79899 Other long term (current) drug therapy: Secondary | ICD-10-CM | POA: Insufficient documentation

## 2016-01-17 DIAGNOSIS — Z79891 Long term (current) use of opiate analgesic: Secondary | ICD-10-CM | POA: Insufficient documentation

## 2016-01-23 ENCOUNTER — Other Ambulatory Visit
Admission: RE | Admit: 2016-01-23 | Discharge: 2016-01-23 | Disposition: A | Discharge status: Home / Self Care | Payer: Medicare Other | Source: Ambulatory Visit | Attending: Nephrology | Admitting: Nephrology

## 2016-01-23 DIAGNOSIS — N183 Chronic kidney disease, stage 3 (moderate): Secondary | ICD-10-CM | POA: Diagnosis present

## 2016-01-23 DIAGNOSIS — I10 Essential (primary) hypertension: Secondary | ICD-10-CM | POA: Diagnosis present

## 2016-01-23 DIAGNOSIS — N179 Acute kidney failure, unspecified: Secondary | ICD-10-CM | POA: Insufficient documentation

## 2016-01-23 LAB — COMPREHENSIVE METABOLIC PANEL
ALT: 15 U/L (ref 14–54)
AST: 19 U/L (ref 15–41)
Albumin: 4.2 g/dL (ref 3.5–5.0)
Alkaline Phosphatase: 82 U/L (ref 38–126)
Anion gap: 5 (ref 5–15)
BUN: 52 mg/dL — AB (ref 6–20)
CHLORIDE: 108 mmol/L (ref 101–111)
CO2: 23 mmol/L (ref 22–32)
CREATININE: 1.68 mg/dL — AB (ref 0.44–1.00)
Calcium: 9.6 mg/dL (ref 8.9–10.3)
GFR calc Af Amer: 31 mL/min — ABNORMAL LOW (ref >60)
GFR, EST NON AFRICAN AMERICAN: 26 mL/min — AB (ref >60)
Glucose, Bld: 101 mg/dL — ABNORMAL HIGH (ref 65–99)
Potassium: 4.7 mmol/L (ref 3.5–5.1)
Sodium: 136 mmol/L (ref 135–145)
Total Bilirubin: 0.5 mg/dL (ref 0.3–1.2)
Total Protein: 7.7 g/dL (ref 6.5–8.1)

## 2016-01-23 LAB — CBC WITH DIFFERENTIAL/PLATELET
Basophils Absolute: 0.1 10*3/uL (ref 0–0.1)
Basophils Relative: 1 %
EOS PCT: 1 %
Eosinophils Absolute: 0.1 10*3/uL (ref 0–0.7)
HEMATOCRIT: 43.7 % (ref 35.0–47.0)
Hemoglobin: 14.3 g/dL (ref 12.0–16.0)
LYMPHS PCT: 19 %
Lymphs Abs: 1.6 10*3/uL (ref 1.0–3.6)
MCH: 30.8 pg (ref 26.0–34.0)
MCHC: 32.8 g/dL (ref 32.0–36.0)
MCV: 94 fL (ref 80.0–100.0)
MONO ABS: 0.6 10*3/uL (ref 0.2–0.9)
MONOS PCT: 7 %
NEUTROS ABS: 6.1 10*3/uL (ref 1.4–6.5)
Neutrophils Relative %: 72 %
PLATELETS: 249 10*3/uL (ref 150–440)
RBC: 4.65 MIL/uL (ref 3.80–5.20)
RDW: 14.5 % (ref 11.5–14.5)
WBC: 8.5 10*3/uL (ref 3.6–11.0)

## 2016-01-23 LAB — PHOSPHORUS: Phosphorus: 4.6 mg/dL (ref 2.5–4.6)

## 2016-01-24 LAB — PROTEIN ELECTRO, RANDOM URINE
ALPHA-2-GLOBULIN, U: 10.3 %
Albumin ELP, Urine: 34.7 %
Alpha-1-Globulin, U: 3.3 %
Beta Globulin, U: 27.7 %
Gamma Globulin, U: 24 %
TOTAL PROTEIN, URINE-UPE24: 9.1 mg/dL

## 2016-01-24 LAB — PROTEIN ELECTROPHORESIS, SERUM
A/G RATIO SPE: 1.2 (ref 0.7–1.7)
Albumin ELP: 3.7 g/dL (ref 2.9–4.4)
Alpha-1-Globulin: 0.3 g/dL (ref 0.0–0.4)
Alpha-2-Globulin: 0.8 g/dL (ref 0.4–1.0)
Beta Globulin: 1.1 g/dL (ref 0.7–1.3)
GLOBULIN, TOTAL: 3.2 g/dL (ref 2.2–3.9)
Gamma Globulin: 1 g/dL (ref 0.4–1.8)
Total Protein ELP: 6.9 g/dL (ref 6.0–8.5)

## 2016-01-24 LAB — ANA W/REFLEX IF POSITIVE: ANA: NEGATIVE

## 2016-01-24 LAB — PARATHYROID HORMONE, INTACT (NO CA): PTH: 14 pg/mL — AB (ref 15–65)

## 2016-01-25 ENCOUNTER — Inpatient Hospital Stay: Payer: Medicare Other | Attending: Oncology | Admitting: Oncology

## 2016-01-25 VITALS — BP 93/50 | HR 65 | Temp 98.7°F | Ht 64.0 in | Wt 117.7 lb

## 2016-01-25 DIAGNOSIS — I252 Old myocardial infarction: Secondary | ICD-10-CM | POA: Diagnosis not present

## 2016-01-25 DIAGNOSIS — E785 Hyperlipidemia, unspecified: Secondary | ICD-10-CM | POA: Insufficient documentation

## 2016-01-25 DIAGNOSIS — Z79899 Other long term (current) drug therapy: Secondary | ICD-10-CM | POA: Diagnosis not present

## 2016-01-25 DIAGNOSIS — Z720 Tobacco use: Secondary | ICD-10-CM | POA: Insufficient documentation

## 2016-01-25 DIAGNOSIS — J449 Chronic obstructive pulmonary disease, unspecified: Secondary | ICD-10-CM | POA: Diagnosis not present

## 2016-01-25 DIAGNOSIS — Z79811 Long term (current) use of aromatase inhibitors: Secondary | ICD-10-CM | POA: Insufficient documentation

## 2016-01-25 DIAGNOSIS — M549 Dorsalgia, unspecified: Secondary | ICD-10-CM | POA: Diagnosis not present

## 2016-01-25 DIAGNOSIS — I10 Essential (primary) hypertension: Secondary | ICD-10-CM | POA: Insufficient documentation

## 2016-01-25 DIAGNOSIS — Z803 Family history of malignant neoplasm of breast: Secondary | ICD-10-CM | POA: Diagnosis not present

## 2016-01-25 DIAGNOSIS — E782 Mixed hyperlipidemia: Secondary | ICD-10-CM | POA: Insufficient documentation

## 2016-01-25 DIAGNOSIS — M810 Age-related osteoporosis without current pathological fracture: Secondary | ICD-10-CM

## 2016-01-25 DIAGNOSIS — F17210 Nicotine dependence, cigarettes, uncomplicated: Secondary | ICD-10-CM | POA: Diagnosis not present

## 2016-01-25 DIAGNOSIS — Z85118 Personal history of other malignant neoplasm of bronchus and lung: Secondary | ICD-10-CM | POA: Diagnosis not present

## 2016-01-25 DIAGNOSIS — I219 Acute myocardial infarction, unspecified: Secondary | ICD-10-CM | POA: Insufficient documentation

## 2016-01-25 DIAGNOSIS — C50919 Malignant neoplasm of unspecified site of unspecified female breast: Secondary | ICD-10-CM

## 2016-01-25 DIAGNOSIS — Z170 Estrogen receptor positive status [ER+]: Secondary | ICD-10-CM | POA: Insufficient documentation

## 2016-01-25 DIAGNOSIS — C3490 Malignant neoplasm of unspecified part of unspecified bronchus or lung: Secondary | ICD-10-CM

## 2016-01-25 DIAGNOSIS — Z923 Personal history of irradiation: Secondary | ICD-10-CM

## 2016-01-25 DIAGNOSIS — N183 Chronic kidney disease, stage 3 unspecified: Secondary | ICD-10-CM

## 2016-01-25 DIAGNOSIS — Z7982 Long term (current) use of aspirin: Secondary | ICD-10-CM | POA: Insufficient documentation

## 2016-01-25 DIAGNOSIS — C50912 Malignant neoplasm of unspecified site of left female breast: Secondary | ICD-10-CM

## 2016-01-25 NOTE — Progress Notes (Signed)
[place]  [phone] [phone]) [phone]  ID: [name] [name] OB: 1928/04/28  MR#: [id number]  KGY#:[id number]  Patient Care Team: Ezequiel Kayser, MD as PCP - General (Internal Medicine)  CHIEF COMPLAINT:  Chief Complaint  Patient presents with  . Osteoporosis  . Lung Cancer  . Breast Cancer    INTERVAL HISTORY: Patient returns to clinic today for 6 month follow up and further evaluation. She continues to feel well and is asymptomatic.  She is tolerating letrozole without significant side effects. She does complain of some right side upper back pain that she has had intermittently for a couple months. She has no neurologic complaints.  She denies any recent fevers or illnesses. She denies any chest pain, cough, or shortness of breath.  She denies any nausea, vomiting, constipation, or diarrhea.  She has no urinary complaints.  Patient offers no specific complaints today.  REVIEW OF SYSTEMS:   Review of Systems  Constitutional: Negative.   Respiratory: Negative.   Cardiovascular: Negative.   Gastrointestinal: Negative.   Musculoskeletal: Positive for back pain.  Neurological: Negative.     As per HPI. Otherwise, a complete review of systems is negatve.  PAST MEDICAL HISTORY: Past Medical History  Diagnosis Date  . COPD (chronic obstructive pulmonary disease) ([place])   . Hypertension   . Migraine   . Renal artery stenosis ([place])   . Myocardial infarction ([place])   . Osteoporosis   . Hailey-hailey disease   . Hyperlipemia   . Lung cancer ([place]) 2014    RT LUNG  . Breast cancer ([place]) 2013    LT LUMPECTOMY  . Radiation 2013    FOR BREAST CA  . Radiation 2014    FOR LUNG CA    PAST SURGICAL HISTORY: Past Surgical History  Procedure Laterality Date  . Breast lumpectomy Left   . Parathyroidectomy    . Abdominal hysterectomy    . Cardiac stents    . Appendectomy    . Tonsillectomy      FAMILY HISTORY Family History  Problem  Relation Age of Onset  . Breast cancer Sister        ADVANCED DIRECTIVES:    HEALTH MAINTENANCE: Social History  Substance Use Topics  . Smoking status: Current Every Day Smoker -- 1.00 packs/day for 70 years  . Smokeless tobacco: Not on file  . Alcohol Use: No      Allergies  Allergen Reactions  . Bactrim [Sulfamethoxazole-Trimethoprim] Other (See Comments)    Reduced kidney function.  Per Dr. Holley Raring should not take  . Macrobid [employer] Macro] Other (See Comments)    Reduced Kidney function.  Should no longer take per Dr. Holley Raring  . Tape Rash    Current Outpatient Prescriptions  Medication Sig Dispense Refill  . albuterol (PROAIR HFA) 108 (90 Base) MCG/ACT inhaler     . amLODipine (NORVASC) 2.5 MG tablet Take 2.5 mg by mouth daily.    Marland Kitchen aspirin 325 MG tablet Take 325 mg by mouth daily.    Marland Kitchen azelastine (ASTELIN) 0.1 % nasal spray Place into the nose.    . Calcium Carbonate-Vitamin D (CALCIUM-VITAMIN D) 500-200 MG-UNIT per tablet Take 1 tablet by mouth daily.    . fluticasone (FLONASE) 50 MCG/ACT nasal spray Place 2 sprays into both nostrils daily.    . fluticasone (FLONASE) 50 MCG/ACT nasal spray Place into the nose.    Marland Kitchen ketotifen (ZADITOR) 0.025 % ophthalmic solution 1 drop 2 (two) times daily.    Marland Kitchen  letrozole (FEMARA) 2.5 MG tablet TAKE 1 TABLET DAILY 90 tablet 2  . lisinopril (PRINIVIL,ZESTRIL) 20 MG tablet Take 20 mg by mouth daily.    . metoprolol succinate (TOPROL-XL) 50 MG 24 hr tablet Take 25 mg by mouth daily. Take with or immediately following a meal.    . Multiple Vitamin (MULTIVITAMIN) tablet Take 1 tablet by mouth daily.    . simvastatin (ZOCOR) 40 MG tablet Take 40 mg by mouth daily.    . traMADol (ULTRAM) 50 MG tablet Take by mouth every 6 (six) hours as needed.    Marland Kitchen ketotifen (ZADITOR) 0.025 % ophthalmic solution Apply to eye.    . ondansetron (ZOFRAN) 8 MG tablet Take 1 tablet (8 mg total) by mouth 2 (two) times daily. 6 tablet 0   No  current facility-administered medications for this visit.    OBJECTIVE: Filed Vitals:   01/25/16 1057  BP: 93/50  Pulse: 65  Temp: 98.7 F (37.1 C)     Body mass index is 20.2 kg/(m^2).    ECOG FS:0 - Asymptomatic  General: Well-developed, well-nourished, no acute distress. Eyes: Pink conjunctiva, anicteric sclera. Breasts: Patient requested exam be deferred today. Lungs: Clear to auscultation bilaterally. Heart: Regular rate and rhythm. No rubs, murmurs, or gallops. Abdomen: Soft, nontender, nondistended. No organomegaly noted, normoactive bowel sounds. Musculoskeletal: No edema, cyanosis, or clubbing. Neuro: Alert, answering all questions appropriately. Cranial nerves grossly intact. Skin: No rashes or petechiae noted. Psych: Normal affect.   LAB RESULTS:  Lab Results  Component Value Date   NA 136 01/23/2016   K 4.7 01/23/2016   CL 108 01/23/2016   CO2 23 01/23/2016   GLUCOSE 101* 01/23/2016   BUN 52* 01/23/2016   CREATININE 1.68* 01/23/2016   CALCIUM 9.6 01/23/2016   PROT 7.7 01/23/2016   ALBUMIN 4.2 01/23/2016   AST 19 01/23/2016   ALT 15 01/23/2016   ALKPHOS 82 01/23/2016   BILITOT 0.5 01/23/2016   GFRNONAA 26* 01/23/2016   GFRAA 31* 01/23/2016    Lab Results  Component Value Date   WBC 8.5 01/23/2016   NEUTROABS 6.1 01/23/2016   HGB 14.3 01/23/2016   HCT 43.7 01/23/2016   MCV 94.0 01/23/2016   PLT 249 01/23/2016     STUDIES: No results found.  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  Breast cancer:  Given patient's advanced age and low stage of disease, she did not require adjuvant chemotherapy.  Oncotype testing was not performed as this would not change the treatment plan.  Continue letrozle completing in August 2019.  Her most recent mammogram on July 16, 2015 was reported as BI-RADS 1 repeat in August 2017.  Return to clinic in 7 months for  routine evaluation. 2. Osteoporosis: Patient's most recent bone mineral density on August 01, 2015 and was reported as -2.8.  Continue calcium and vitamin D. Patient also receives Prolia every 6 months but her last injection was May 2016. She requests to have her Prolia injections here at this clinic in the future so we will set her up in the next 1-2 weeks for that injection. She will have Prolia every 6 months at her follow up visits ongoing. She will have a Repeat bone mineral density in one year.  3.  Lung cancer: CT scan results reviewed independently and reported as above, patient previously elected to proceed with XRT only. No intervention is needed  at this time.  Patient is now nearly 2 years removed from completing her XRT and comparison with yearly CT scans for routine surveillance. She will have a CT scan prior to her next visit. 4. Creatinine: 1.68 on January 23, 2016 5. Calcium: 9.6 on January 23, 2016 6. Back pain: Follow recommendations of pcp.  Patient expressed understanding and was in agreement with this plan. She also understands that She can call clinic at any time with any questions, concerns, or complaints.    Mayra Reel, NP   01/25/2016 11:23 AM  Patient was seen and evaluated independently and I agree with the assessment and plan as above.  Lloyd Huger, MD 01/25/2016 11:32 PM

## 2016-01-25 NOTE — Progress Notes (Signed)
Patient here for a follow up visit.  Will start to have prolia injections and [place]

## 2016-01-28 ENCOUNTER — Other Ambulatory Visit: Payer: Self-pay | Admitting: *Deleted

## 2016-01-28 MED ORDER — LETROZOLE 2.5 MG PO TABS: 2.5000 mg | ORAL_TABLET | Freq: Every day | ORAL | Status: DC

## 2016-01-29 MED ORDER — LETROZOLE 2.5 MG PO TABS: 2.5000 mg | ORAL_TABLET | Freq: Every day | ORAL | Status: DC

## 2016-01-29 NOTE — Addendum Note (Signed)
Addended by: Jarrett Soho C on: 01/29/2016 10:03 AM   Modules accepted: Orders, Medications

## 2016-01-29 NOTE — Addendum Note (Signed)
Addended by: Betti Cruz on: 01/29/2016 09:48 AM   Modules accepted: Orders

## 2016-02-01 ENCOUNTER — Inpatient Hospital Stay: Payer: Medicare Other

## 2016-02-07 ENCOUNTER — Inpatient Hospital Stay: Payer: Medicare Other

## 2016-02-07 VITALS — BP 149/73 | HR 73 | Temp 97.8°F | Resp 21

## 2016-02-07 DIAGNOSIS — M810 Age-related osteoporosis without current pathological fracture: Secondary | ICD-10-CM

## 2016-02-07 DIAGNOSIS — C50912 Malignant neoplasm of unspecified site of left female breast: Secondary | ICD-10-CM | POA: Diagnosis not present

## 2016-02-07 MED ORDER — DENOSUMAB 60 MG/ML ~~LOC~~ SOLN
60.0000 mg | Freq: Once | SUBCUTANEOUS | Status: AC
  Administered 2016-02-07: 60 mg via SUBCUTANEOUS
  Filled 2016-02-07: qty 1

## 2016-02-27 ENCOUNTER — Telehealth: Payer: Self-pay | Admitting: *Deleted

## 2016-02-27 NOTE — Telephone Encounter (Signed)
Called to report that last night, patient came to her to report that she is having pain in her left breast and she noted that the breast is discolored and has a change in appearance. Would like the patient to be seen and evaluated ASAP This is a Dr Grayland Ormond patient

## 2016-02-27 NOTE — Telephone Encounter (Signed)
Patient to see Trish tomorrow morning, agrees to 930 appt on 4/13

## 2016-02-28 ENCOUNTER — Inpatient Hospital Stay: Payer: Medicare Other | Attending: Internal Medicine | Admitting: Internal Medicine

## 2016-02-28 VITALS — BP 135/74 | HR 75 | Temp 95.2°F | Resp 18 | Wt 117.3 lb

## 2016-02-28 DIAGNOSIS — N644 Mastodynia: Secondary | ICD-10-CM | POA: Diagnosis not present

## 2016-02-28 DIAGNOSIS — Z79899 Other long term (current) drug therapy: Secondary | ICD-10-CM | POA: Diagnosis not present

## 2016-02-28 DIAGNOSIS — C50912 Malignant neoplasm of unspecified site of left female breast: Secondary | ICD-10-CM | POA: Diagnosis not present

## 2016-02-28 DIAGNOSIS — J449 Chronic obstructive pulmonary disease, unspecified: Secondary | ICD-10-CM | POA: Insufficient documentation

## 2016-02-28 DIAGNOSIS — E785 Hyperlipidemia, unspecified: Secondary | ICD-10-CM | POA: Diagnosis not present

## 2016-02-28 DIAGNOSIS — I10 Essential (primary) hypertension: Secondary | ICD-10-CM | POA: Diagnosis not present

## 2016-02-28 DIAGNOSIS — M810 Age-related osteoporosis without current pathological fracture: Secondary | ICD-10-CM | POA: Diagnosis not present

## 2016-02-28 DIAGNOSIS — F17210 Nicotine dependence, cigarettes, uncomplicated: Secondary | ICD-10-CM | POA: Diagnosis not present

## 2016-02-28 DIAGNOSIS — Z85118 Personal history of other malignant neoplasm of bronchus and lung: Secondary | ICD-10-CM

## 2016-02-28 DIAGNOSIS — Z170 Estrogen receptor positive status [ER+]: Secondary | ICD-10-CM

## 2016-02-28 DIAGNOSIS — I252 Old myocardial infarction: Secondary | ICD-10-CM | POA: Diagnosis not present

## 2016-02-28 DIAGNOSIS — N63 Unspecified lump in unspecified breast: Secondary | ICD-10-CM

## 2016-02-28 NOTE — Progress Notes (Signed)
[place]  [phone]) [phone] [phone]) [phone]  ID: [name] [name] OB: 1928/07/13  MR#: [id number]  AOZ#:[id number]  Patient Care Team: Ezequiel Kayser, MD as PCP - General (Internal Medicine)  CHIEF COMPLAINT:  Stage 1a adenocarcinoma of left breast, ER positive, PR/HER-2 negative  Stage 1a adenocarcinoma of lung Pain in her left breast and breast is discolored and changed in appearance  INTERVAL HISTORY: Patient returns to clinic today with complaints of pain in her left breast that started 2 days ago and the nipple turned bright red and swollen. Yesterday the redness, swelling and pain started to subside and today the redness and swelling is resolved and the pain is very mild. She did not fall or get injured these symptoms were unprovoked and sudden. She has no other complaints today and otherwise feels well.   REVIEW OF SYSTEMS:   Review of Systems  Constitutional: Negative.   Respiratory: Negative.   Cardiovascular: Negative.   Gastrointestinal: Negative.   Musculoskeletal: Negative.   Skin:       Left breast discolored  Neurological: Negative.     As per HPI. Otherwise, a complete review of systems is negatve.  PAST MEDICAL HISTORY: Past Medical History  Diagnosis Date  . COPD (chronic obstructive pulmonary disease) ([place])   . Hypertension   . Migraine   . Renal artery stenosis ([place])   . Myocardial infarction ([place])   . Osteoporosis   . Hailey-hailey disease   . Hyperlipemia   . Lung cancer ([place]) 2014    RT LUNG  . Breast cancer ([place]) 2013    LT LUMPECTOMY  . Radiation 2013    FOR BREAST CA  . Radiation 2014    FOR LUNG CA    PAST SURGICAL HISTORY: Past Surgical History  Procedure Laterality Date  . Breast lumpectomy Left   . Parathyroidectomy    . Abdominal hysterectomy    . Cardiac stents    . Appendectomy    . Tonsillectomy      FAMILY HISTORY Family History  Problem Relation Age of Onset  . Breast cancer Sister         ADVANCED DIRECTIVES:    HEALTH MAINTENANCE: Social History  Substance Use Topics  . Smoking status: Current Every Day Smoker -- 1.00 packs/day for 70 years  . Smokeless tobacco: Not on file  . Alcohol Use: No      Allergies  Allergen Reactions  . Bactrim [Sulfamethoxazole-Trimethoprim] Other (See Comments)    Reduced kidney function.  Per Dr. Holley Raring should not take  . Macrobid [employer] Macro] Other (See Comments)    Reduced Kidney function.  Should no longer take per Dr. Holley Raring  . Tape Rash    Current Outpatient Prescriptions  Medication Sig Dispense Refill  . albuterol (PROAIR HFA) 108 (90 Base) MCG/ACT inhaler     . amLODipine (NORVASC) 2.5 MG tablet Take 2.5 mg by mouth daily.    Marland Kitchen aspirin 325 MG tablet Take 325 mg by mouth daily.    Marland Kitchen azelastine (ASTELIN) 0.1 % nasal spray Place into the nose.    . Calcium Carbonate-Vitamin D (CALCIUM-VITAMIN D) 500-200 MG-UNIT per tablet Take 1 tablet by mouth daily.    . fluticasone (FLONASE) 50 MCG/ACT nasal spray Place 2 sprays into both nostrils daily.    . fluticasone (FLONASE) 50 MCG/ACT nasal spray Place into the nose.    Marland Kitchen ketotifen (ZADITOR) 0.025 % ophthalmic solution 1 drop 2 (two) times daily.    Marland Kitchen ketotifen (ZADITOR)  0.025 % ophthalmic solution Apply to eye.    Marland Kitchen letrozole (FEMARA) 2.5 MG tablet Take 1 tablet (2.5 mg total) by mouth daily. 90 tablet 2  . lisinopril (PRINIVIL,ZESTRIL) 20 MG tablet Take 20 mg by mouth daily.    . metoprolol succinate (TOPROL-XL) 50 MG 24 hr tablet Take 25 mg by mouth daily. Take with or immediately following a meal.    . Multiple Vitamin (MULTIVITAMIN) tablet Take 1 tablet by mouth daily.    . ondansetron (ZOFRAN) 8 MG tablet Take 1 tablet (8 mg total) by mouth 2 (two) times daily. 6 tablet 0  . simvastatin (ZOCOR) 40 MG tablet Take 40 mg by mouth daily.    . traMADol (ULTRAM) 50 MG tablet Take by mouth every 6 (six) hours as needed.     No current  facility-administered medications for this visit.    OBJECTIVE: There were no vitals filed for this visit.   There is no weight on file to calculate BMI.    ECOG FS:0 - Asymptomatic  General: Well-developed, well-nourished, no acute distress. Eyes: Pink conjunctiva, anicteric sclera. Breasts: No lumps or discoloration.  Lungs: Clear to auscultation bilaterally. Heart: Regular rate and rhythm. No rubs, murmurs, or gallops. Abdomen: Soft, nontender, nondistended. No organomegaly noted, normoactive bowel sounds. Musculoskeletal: No edema, cyanosis, or clubbing. Neuro: Alert, answering all questions appropriately. Cranial nerves grossly intact. Skin: No rashes  Psych: Normal affect.   LAB RESULTS:  Lab Results  Component Value Date   NA 136 01/23/2016   K 4.7 01/23/2016   CL 108 01/23/2016   CO2 23 01/23/2016   GLUCOSE 101* 01/23/2016   BUN 52* 01/23/2016   CREATININE 1.68* 01/23/2016   CALCIUM 9.6 01/23/2016   PROT 7.7 01/23/2016   ALBUMIN 4.2 01/23/2016   AST 19 01/23/2016   ALT 15 01/23/2016   ALKPHOS 82 01/23/2016   BILITOT 0.5 01/23/2016   GFRNONAA 26* 01/23/2016   GFRAA 31* 01/23/2016    Lab Results  Component Value Date   WBC 8.5 01/23/2016   NEUTROABS 6.1 01/23/2016   HGB 14.3 01/23/2016   HCT 43.7 01/23/2016   MCV 94.0 01/23/2016   PLT 249 01/23/2016     STUDIES: No results found.  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  Breast cancer:  Given patient's advanced age and low stage of disease, she did not require adjuvant chemotherapy.  Oncotype testing was not performed as this would not change the treatment plan.  Continue letrozle completing in August 2019.  Her most recent mammogram on July 16, 2015 was reported as BI-RADS 1 repeat in August 2017.  Return to clinic after mammogram. 2. Osteoporosis: Patient's most recent bone mineral density on  August 01, 2015 and was reported as -2.8.  Continue calcium and vitamin D. Patient also receives Prolia every 6 months but her last injection was May 2016. She will have Prolia every 6 months at her follow up visits ongoing. She will have a Repeat bone mineral density in one year.  3.  Lung cancer: Patient previously elected to proceed with XRT only. No intervention is needed at this time.  Patient is now nearly 2 years removed from completing her XRT and comparison with yearly CT scans for routine surveillance. She will have a CT scan prior to her next visit. 4. Breast pain/discoloration: Resolved. Patient will call the office if she has another episode and  will consider moving mammogram up if needed.  Patient expressed understanding and was in agreement with this plan. She also understands that She can call clinic at any time with any questions, concerns, or complaints.    Mayra Reel, NP   02/28/2016 9:15 AM

## 2016-02-28 NOTE — Progress Notes (Signed)
Patient states she has been having pain in her left breast since Tuesday.

## 2016-04-18 ENCOUNTER — Ambulatory Visit: Admission: RE | Admit: 2016-04-18 | Payer: Medicare Other | Source: Ambulatory Visit | Admitting: Radiation Oncology

## 2016-05-09 ENCOUNTER — Encounter: Payer: Self-pay | Admitting: Radiation Oncology

## 2016-05-09 ENCOUNTER — Ambulatory Visit
Admission: RE | Admit: 2016-05-09 | Discharge: 2016-05-09 | Disposition: A | Discharge status: Home / Self Care | Payer: Medicare Other | Source: Ambulatory Visit | Attending: Radiation Oncology | Admitting: Radiation Oncology

## 2016-05-09 VITALS — BP 128/75 | HR 68 | Temp 97.8°F | Resp 18 | Wt 116.3 lb

## 2016-05-09 DIAGNOSIS — Z923 Personal history of irradiation: Secondary | ICD-10-CM | POA: Diagnosis not present

## 2016-05-09 DIAGNOSIS — Z85118 Personal history of other malignant neoplasm of bronchus and lung: Secondary | ICD-10-CM | POA: Insufficient documentation

## 2016-05-09 DIAGNOSIS — Z853 Personal history of malignant neoplasm of breast: Secondary | ICD-10-CM | POA: Diagnosis not present

## 2016-05-09 DIAGNOSIS — C50919 Malignant neoplasm of unspecified site of unspecified female breast: Secondary | ICD-10-CM

## 2016-05-09 DIAGNOSIS — C3491 Malignant neoplasm of unspecified part of right bronchus or lung: Secondary | ICD-10-CM

## 2016-05-09 NOTE — Progress Notes (Signed)
Radiation Oncology Follow up Note  Name: [name] [name] [facility]   Date:   05/09/2016 MRN:  [id number] DOB: 11-Aug-1928    This 82 y.o. female presents to the clinic today for follow-up for both breast and lung cancer.  REFERRING PROVIDER: Ezequiel Kayser, MD  HPI: Patient is a 82 year old female now out 2 and half years since completing radiation therapy for lung cancer in 3 and half years having completed left-sided whole breast radiation for breast cancer. She had SB RT of the right upper lobe for which she has done well. She specifically denies breast tenderness cough or bone pain. She is having no dysphagia and has no sitting and shortness of breath.. Her last CT scan back in August 2016 shows stable radiation changes involving the right right upper lobe and stable subareolar scarring of the left breast. She has another CT scan scheduled in August 2017.  COMPLICATIONS OF TREATMENT: none  FOLLOW UP COMPLIANCE: keeps appointments   PHYSICAL EXAM:  BP 128/75 mmHg  Pulse 68  Temp(Src) 97.8 F (36.6 C)  Resp 18  Wt 116 lb 4.7 oz (52.75 kg) Well-developed thin female in NAD. Left breast is slightly weak retracted. No dominant mass or nodularity is noted in either breast in 2 positions examined no cervical or supra clavicular adenopathy is identified bilaterally. Well-developed well-nourished patient in NAD. HEENT reveals PERLA, EOMI, discs not visualized.  Oral cavity is clear. No oral mucosal lesions are identified. Neck is clear without evidence of cervical or supraclavicular adenopathy. Lungs are clear to A&P. Cardiac examination is essentially unremarkable with regular rate and rhythm without murmur rub or thrill. Abdomen is benign with no organomegaly or masses noted. Motor sensory and DTR levels are equal and symmetric in the upper and lower extremities. Cranial nerves II through XII are grossly intact. Proprioception is intact. No peripheral adenopathy or edema is identified. No motor or  sensory levels are noted. Crude visual fields are within normal range.  RADIOLOGY RESULTS: Prior CT scan is reviewed and compatible with the above-stated findings  PLAN: Present time she continues to do well with no evidence of disease. We'll have a repeat CT scan in August. I otherwise have asked to see her back in 1 year for follow-up. Should be any significant changes on CT scan we'll certainly reevaluate the patient. Patient knows to call sooner with any concerns.  I would like to take this opportunity to thank you for allowing me to participate in the care of your patient.Armstead Peaks., MD

## 2016-06-18 DIAGNOSIS — M545 Low back pain, unspecified: Secondary | ICD-10-CM | POA: Insufficient documentation

## 2016-06-18 DIAGNOSIS — G8929 Other chronic pain: Secondary | ICD-10-CM | POA: Insufficient documentation

## 2016-07-14 ENCOUNTER — Ambulatory Visit: Admission: RE | Admit: 2016-07-14 | Payer: Medicare Other | Source: Ambulatory Visit

## 2016-07-17 ENCOUNTER — Other Ambulatory Visit: Payer: Self-pay | Admitting: Oncology

## 2016-07-17 ENCOUNTER — Ambulatory Visit
Admission: RE | Admit: 2016-07-17 | Discharge: 2016-07-17 | Disposition: A | Discharge status: Home / Self Care | Payer: Medicare Other | Source: Ambulatory Visit | Attending: Oncology | Admitting: Oncology

## 2016-07-17 DIAGNOSIS — N183 Chronic kidney disease, stage 3 unspecified: Secondary | ICD-10-CM

## 2016-07-17 DIAGNOSIS — C50919 Malignant neoplasm of unspecified site of unspecified female breast: Secondary | ICD-10-CM

## 2016-07-17 DIAGNOSIS — C50312 Malignant neoplasm of lower-inner quadrant of left female breast: Secondary | ICD-10-CM | POA: Insufficient documentation

## 2016-07-17 NOTE — Progress Notes (Signed)
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  INTERVAL HISTORY: Patient returns to clinic today for routine six-month follow-up. She currently feels well and is asymptomatic. She is tolerating letrozole well without significant side effects. She has no neurologic complaints. She denies any recent fevers or illnesses. She has a good appetite and denies weight loss. She has no chest pain, shortness of breath, cough, or hemoptysis. She denies any nausea, vomiting, constipation, or diarrhea. She has no urinary complaints. Patient offers no specific complaints today.    REVIEW OF SYSTEMS:   Review of Systems  Constitutional: Negative.  Negative for fever, malaise/fatigue and weight loss.  Respiratory: Negative.  Negative for cough and shortness of breath.   Cardiovascular: Negative.  Negative for chest pain.  Gastrointestinal: Negative.  Negative for abdominal pain.  Genitourinary: Negative.   Musculoskeletal: Negative.   Skin: Negative.   Neurological: Negative.  Negative for weakness.  Psychiatric/Behavioral: Negative.  The patient is not nervous/anxious.     As per HPI. Otherwise, a complete review of systems is negatve.  PAST MEDICAL HISTORY: Past Medical History:  Diagnosis Date  . Breast cancer ([place]) 08/19/2012   LT LUMPECTOMY, radiation  . COPD (chronic obstructive pulmonary disease) ([place])   . Hailey-hailey disease   . Hyperlipemia   . Hypertension   . Lung cancer ([place]) 2014   RT LUNG, radiation and chemo  . Migraine   . Myocardial infarction ([place])   . Osteoporosis   . Radiation 2013   FOR BREAST CA  . Radiation 2014     FOR LUNG CA  . Renal artery stenosis ([place])     PAST SURGICAL HISTORY: Past Surgical History:  Procedure Laterality Date  . ABDOMINAL HYSTERECTOMY    . APPENDECTOMY    . BREAST BIOPSY Left 08/03/2012   positive, u/s guided bx  . BREAST LUMPECTOMY Left   . cardiac stents    . PARATHYROIDECTOMY    . TONSILLECTOMY      FAMILY HISTORY Family History  Problem Relation Age of Onset  . Breast cancer Sister 57       ADVANCED DIRECTIVES:    HEALTH MAINTENANCE: Social History  Substance Use Topics  . Smoking status: Current Every Day Smoker    Packs/day: 1.00    Years: 70.00  . Smokeless tobacco: Not on file  . Alcohol use No      Allergies  Allergen Reactions  . Bactrim [Sulfamethoxazole-Trimethoprim] Other (See Comments)    Reduced kidney function.  Per Dr. Holley Raring should not take  . Macrobid [employer] Macro] Other (See Comments)    Reduced Kidney function.  Should no longer take per Dr. Holley Raring  . Tape Rash    Current Outpatient Prescriptions  Medication Sig Dispense Refill  . albuterol (PROAIR HFA) 108 (90 Base) MCG/ACT inhaler     . amLODipine (NORVASC) 2.5 MG tablet Take 2.5 mg by mouth daily.    Marland Kitchen aspirin 325 MG tablet Take 325 mg by mouth daily.    Marland Kitchen azelastine (ASTELIN) 0.1 % nasal spray Place into the nose.    . Calcium Carbonate-Vitamin D (CALCIUM-VITAMIN D) 500-200 MG-UNIT per tablet Take 1 tablet by mouth daily.    Marland Kitchen  fluticasone (FLONASE) 50 MCG/ACT nasal spray Place 2 sprays into both nostrils daily.    Marland Kitchen ketotifen (ZADITOR) 0.025 % ophthalmic solution 1 drop 2 (two) times daily.    Marland Kitchen letrozole (FEMARA) 2.5 MG tablet Take 1 tablet (2.5 mg total) by mouth daily. 90 tablet 2  . lisinopril (PRINIVIL,ZESTRIL) 20 MG tablet Take 20 mg by mouth daily.    . metoprolol succinate (TOPROL-XL) 50 MG 24 hr tablet Take 25 mg by mouth daily. Take with or immediately following a meal.    . Multiple Vitamin (MULTIVITAMIN) tablet Take 1 tablet by mouth  daily.    . simvastatin (ZOCOR) 40 MG tablet Take 40 mg by mouth daily.    . traMADol (ULTRAM) 50 MG tablet Take by mouth every 6 (six) hours as needed.     No current facility-administered medications for this visit.     OBJECTIVE: Vitals:   07/18/16 1053  BP: 121/61  Pulse: 66  Resp: 18  Temp: 98.5 F (36.9 C)     Body mass index is 19.83 kg/m.    ECOG FS:0 - Asymptomatic  General: Well-developed, well-nourished, no acute distress. Eyes: Pink conjunctiva, anicteric sclera. Breasts: Bilateral breast and axilla without lumps or masses. Lungs: Clear to auscultation bilaterally. Heart: Regular rate and rhythm. No rubs, murmurs, or gallops. Abdomen: Soft, nontender, nondistended. No organomegaly noted, normoactive bowel sounds. Musculoskeletal: No edema, cyanosis, or clubbing. Neuro: Alert, answering all questions appropriately. Cranial nerves grossly intact. Skin: No rashes  Psych: Normal affect.   LAB RESULTS:  Lab Results  Component Value Date   NA 136 01/23/2016   K 4.7 01/23/2016   CL 108 01/23/2016   CO2 23 01/23/2016   GLUCOSE 101 (H) 01/23/2016   BUN 52 (H) 01/23/2016   CREATININE 1.68 (H) 01/23/2016   CALCIUM 9.6 01/23/2016   PROT 7.7 01/23/2016   ALBUMIN 4.2 01/23/2016   AST 19 01/23/2016   ALT 15 01/23/2016   ALKPHOS 82 01/23/2016   BILITOT 0.5 01/23/2016   GFRNONAA 26 (L) 01/23/2016   GFRAA 31 (L) 01/23/2016    Lab Results  Component Value Date   WBC 8.5 01/23/2016   NEUTROABS 6.1 01/23/2016   HGB 14.3 01/23/2016   HCT 43.7 01/23/2016   MCV 94.0 01/23/2016   PLT 249 01/23/2016     STUDIES: Mm Diag Breast Tomo Bilateral  Result Date: 07/17/2016 CLINICAL DATA:  82 year old female presenting for routine annual evaluation status post left breast lumpectomy in 2013. EXAM: 2D DIGITAL DIAGNOSTIC BILATERAL MAMMOGRAM WITH CAD AND ADJUNCT TOMO COMPARISON:  Previous exam(s). ACR Breast Density Category b: There are scattered areas of fibroglandular  density. FINDINGS: Stable left breast lumpectomy site. No suspicious calcifications, masses or areas of distortion are seen in the bilateral breasts. Mammographic images were processed with CAD. IMPRESSION: The left breast lumpectomy site is stable. No mammographic evidence of malignancy in the bilateral breasts. RECOMMENDATION: Diagnostic mammogram is suggested in 1 year. (Code:DM-B-01Y) I have discussed the findings and recommendations with the patient. Results were also provided in writing at the conclusion of the visit. If applicable, a reminder letter will be sent to the patient regarding the next appointment. BI-RADS CATEGORY  2: Benign. Electronically Signed   By: Ammie Ferrier M.D.   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 Stage Ia ER positive,  PR/HER-2 negative adenocarcinoma of the inner lower quadrant of the left breast: Given patient's advanced age and low stage of disease, she did not require adjuvant chemotherapy.  Oncotype testing was not performed as this would not change the treatment plan.  Continue letrozle completing in August 2019.  Her most recent mammogram on July 17, 2016 was reported as BI-RADS 2 repeat in September 2018. Return to clinic in 6 months for routine evaluation. 2. Osteoporosis: Patient's most recent bone mineral density on August 01, 2015 and was reported as -2.8.  Continue Prolia, calcium and vitamin D. Repeat bone marrow density in the next 1-2 weeks.  3.  Stage Ia adenocarcinoma of the right upper lobe lung: Patient previously elected to proceed with XRT only. No intervention is needed at this time.  Patient is now nearly 2 years removed from completing her XRT. Patient missed her most recently scheduled CT scan, therefore will reschedule the next 1-2 weeks. Follow-up as above.   Patient  expressed understanding and was in agreement with this plan. She also understands that She can call clinic at any time with any questions, concerns, or complaints.    Lloyd Huger, MD   07/18/2016 3:49 PM

## 2016-07-18 ENCOUNTER — Inpatient Hospital Stay: Payer: Medicare Other

## 2016-07-18 ENCOUNTER — Inpatient Hospital Stay: Payer: Medicare Other | Attending: Oncology | Admitting: Oncology

## 2016-07-18 VITALS — BP 121/61 | HR 66 | Temp 98.5°F | Resp 18 | Wt 115.5 lb

## 2016-07-18 DIAGNOSIS — R918 Other nonspecific abnormal finding of lung field: Secondary | ICD-10-CM | POA: Insufficient documentation

## 2016-07-18 DIAGNOSIS — Z7982 Long term (current) use of aspirin: Secondary | ICD-10-CM

## 2016-07-18 DIAGNOSIS — M810 Age-related osteoporosis without current pathological fracture: Secondary | ICD-10-CM | POA: Diagnosis not present

## 2016-07-18 DIAGNOSIS — Z170 Estrogen receptor positive status [ER+]: Secondary | ICD-10-CM

## 2016-07-18 DIAGNOSIS — Z85118 Personal history of other malignant neoplasm of bronchus and lung: Secondary | ICD-10-CM | POA: Insufficient documentation

## 2016-07-18 DIAGNOSIS — C50312 Malignant neoplasm of lower-inner quadrant of left female breast: Secondary | ICD-10-CM | POA: Diagnosis not present

## 2016-07-18 DIAGNOSIS — I10 Essential (primary) hypertension: Secondary | ICD-10-CM | POA: Insufficient documentation

## 2016-07-18 DIAGNOSIS — Z923 Personal history of irradiation: Secondary | ICD-10-CM

## 2016-07-18 DIAGNOSIS — E785 Hyperlipidemia, unspecified: Secondary | ICD-10-CM

## 2016-07-18 DIAGNOSIS — F17210 Nicotine dependence, cigarettes, uncomplicated: Secondary | ICD-10-CM | POA: Diagnosis not present

## 2016-07-18 DIAGNOSIS — Z79811 Long term (current) use of aromatase inhibitors: Secondary | ICD-10-CM | POA: Insufficient documentation

## 2016-07-18 DIAGNOSIS — J449 Chronic obstructive pulmonary disease, unspecified: Secondary | ICD-10-CM | POA: Insufficient documentation

## 2016-07-18 DIAGNOSIS — Z79899 Other long term (current) drug therapy: Secondary | ICD-10-CM | POA: Insufficient documentation

## 2016-07-18 DIAGNOSIS — C3411 Malignant neoplasm of upper lobe, right bronchus or lung: Secondary | ICD-10-CM

## 2016-07-18 DIAGNOSIS — I252 Old myocardial infarction: Secondary | ICD-10-CM | POA: Diagnosis not present

## 2016-07-18 DIAGNOSIS — Z9221 Personal history of antineoplastic chemotherapy: Secondary | ICD-10-CM | POA: Diagnosis not present

## 2016-07-18 MED ORDER — DENOSUMAB 60 MG/ML ~~LOC~~ SOLN
60.0000 mg | Freq: Once | SUBCUTANEOUS | Status: AC
  Administered 2016-07-18: 60 mg via SUBCUTANEOUS
  Filled 2016-07-18: qty 1

## 2016-07-18 NOTE — Patient Instructions (Signed)
Please read pamphlet from the Prolia box.

## 2016-07-18 NOTE — Progress Notes (Signed)
States is feeling well. Offers no complaints. 

## 2016-07-22 ENCOUNTER — Ambulatory Visit
Admission: RE | Admit: 2016-07-22 | Discharge: 2016-07-22 | Disposition: A | Discharge status: Home / Self Care | Payer: Medicare Other | Source: Ambulatory Visit | Attending: Oncology | Admitting: Oncology

## 2016-07-22 ENCOUNTER — Telehealth: Payer: Self-pay | Admitting: *Deleted

## 2016-07-22 ENCOUNTER — Other Ambulatory Visit
Admission: RE | Admit: 2016-07-22 | Discharge: 2016-07-22 | Disposition: A | Discharge status: Home / Self Care | Payer: Medicare Other | Source: Ambulatory Visit | Attending: Oncology | Admitting: Oncology

## 2016-07-22 DIAGNOSIS — M810 Age-related osteoporosis without current pathological fracture: Secondary | ICD-10-CM | POA: Diagnosis not present

## 2016-07-22 DIAGNOSIS — C50919 Malignant neoplasm of unspecified site of unspecified female breast: Secondary | ICD-10-CM

## 2016-07-22 DIAGNOSIS — R911 Solitary pulmonary nodule: Secondary | ICD-10-CM | POA: Insufficient documentation

## 2016-07-22 DIAGNOSIS — K869 Disease of pancreas, unspecified: Secondary | ICD-10-CM | POA: Diagnosis not present

## 2016-07-22 DIAGNOSIS — C50312 Malignant neoplasm of lower-inner quadrant of left female breast: Secondary | ICD-10-CM

## 2016-07-22 DIAGNOSIS — I77810 Thoracic aortic ectasia: Secondary | ICD-10-CM | POA: Insufficient documentation

## 2016-07-22 DIAGNOSIS — I700 Atherosclerosis of aorta: Secondary | ICD-10-CM | POA: Insufficient documentation

## 2016-07-22 DIAGNOSIS — N183 Chronic kidney disease, stage 3 unspecified: Secondary | ICD-10-CM

## 2016-07-22 DIAGNOSIS — Z780 Asymptomatic menopausal state: Secondary | ICD-10-CM | POA: Diagnosis not present

## 2016-07-22 DIAGNOSIS — I714 Abdominal aortic aneurysm, without rupture: Secondary | ICD-10-CM | POA: Diagnosis not present

## 2016-07-22 DIAGNOSIS — I2510 Atherosclerotic heart disease of native coronary artery without angina pectoris: Secondary | ICD-10-CM | POA: Diagnosis not present

## 2016-07-22 DIAGNOSIS — C3411 Malignant neoplasm of upper lobe, right bronchus or lung: Secondary | ICD-10-CM | POA: Insufficient documentation

## 2016-07-22 DIAGNOSIS — Z720 Tobacco use: Secondary | ICD-10-CM | POA: Diagnosis not present

## 2016-07-22 LAB — BASIC METABOLIC PANEL
ANION GAP: 7 (ref 5–15)
BUN: 24 mg/dL — ABNORMAL HIGH (ref 6–20)
CALCIUM: 8.9 mg/dL (ref 8.9–10.3)
CO2: 27 mmol/L (ref 22–32)
Chloride: 105 mmol/L (ref 101–111)
Creatinine, Ser: 1.29 mg/dL — ABNORMAL HIGH (ref 0.44–1.00)
GFR, EST AFRICAN AMERICAN: 42 mL/min — AB (ref >60)
GFR, EST NON AFRICAN AMERICAN: 36 mL/min — AB (ref >60)
Glucose, Bld: 102 mg/dL — ABNORMAL HIGH (ref 65–99)
Potassium: 5.4 mmol/L — ABNORMAL HIGH (ref 3.5–5.1)
SODIUM: 139 mmol/L (ref 135–145)

## 2016-07-22 MED ORDER — IOPAMIDOL (ISOVUE-300) INJECTION 61%
75.0000 mL | Freq: Once | INTRAVENOUS | Status: AC | PRN
Start: 2016-07-22 — End: 2016-07-22
  Administered 2016-07-22: 60 mL via INTRAVENOUS

## 2016-07-22 NOTE — Telephone Encounter (Signed)
[facility] is trying to reach you to call report on patients Chest CT from today. Call back number is [phone].

## 2016-07-23 ENCOUNTER — Other Ambulatory Visit: Payer: Self-pay | Admitting: Oncology

## 2016-07-23 DIAGNOSIS — R911 Solitary pulmonary nodule: Secondary | ICD-10-CM

## 2016-07-29 ENCOUNTER — Inpatient Hospital Stay: Payer: Medicare Other | Admitting: Oncology

## 2016-07-31 ENCOUNTER — Ambulatory Visit
Admission: RE | Admit: 2016-07-31 | Discharge: 2016-07-31 | Disposition: A | Discharge status: Home / Self Care | Payer: Medicare Other | Source: Ambulatory Visit | Attending: Oncology | Admitting: Oncology

## 2016-07-31 DIAGNOSIS — K869 Disease of pancreas, unspecified: Secondary | ICD-10-CM | POA: Diagnosis not present

## 2016-07-31 DIAGNOSIS — R911 Solitary pulmonary nodule: Secondary | ICD-10-CM | POA: Diagnosis not present

## 2016-07-31 DIAGNOSIS — K5730 Diverticulosis of large intestine without perforation or abscess without bleeding: Secondary | ICD-10-CM | POA: Insufficient documentation

## 2016-07-31 DIAGNOSIS — I714 Abdominal aortic aneurysm, without rupture: Secondary | ICD-10-CM | POA: Diagnosis not present

## 2016-07-31 DIAGNOSIS — I700 Atherosclerosis of aorta: Secondary | ICD-10-CM | POA: Diagnosis not present

## 2016-07-31 DIAGNOSIS — I2510 Atherosclerotic heart disease of native coronary artery without angina pectoris: Secondary | ICD-10-CM | POA: Insufficient documentation

## 2016-07-31 LAB — GLUCOSE, CAPILLARY: Glucose-Capillary: 104 mg/dL — ABNORMAL HIGH (ref 65–99)

## 2016-07-31 MED ORDER — FLUDEOXYGLUCOSE F - 18 (FDG) INJECTION
13.0800 | Freq: Once | INTRAVENOUS | Status: AC | PRN
  Administered 2016-07-31: 13.08 via INTRAVENOUS

## 2016-08-04 NOTE — Progress Notes (Signed)
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  INTERVAL HISTORY: Patient returns to clinic today for further evaluation and discussion of her imaging results. She continues to feel well and is asymptomatic. She is tolerating letrozole well without significant side effects. She has no neurologic complaints. She denies any recent fevers or illnesses. She has a good appetite and denies weight loss. She has no chest pain, shortness of breath, cough, or hemoptysis. She denies any nausea, vomiting, constipation, or diarrhea. She has no urinary complaints. Patient offers no specific complaints today.    REVIEW OF SYSTEMS:   Review of Systems  Constitutional: Negative.  Negative for fever, malaise/fatigue and weight loss.  Respiratory: Negative.  Negative for cough and shortness of breath.   Cardiovascular: Negative.  Negative for chest pain.  Gastrointestinal: Negative.  Negative for abdominal pain.  Genitourinary: Negative.   Musculoskeletal: Negative.   Skin: Negative.   Neurological: Negative.  Negative for weakness.  Psychiatric/Behavioral: Negative.  The patient is not nervous/anxious.     As per HPI. Otherwise, a complete review of systems is negative.  PAST MEDICAL HISTORY: Past Medical History:  Diagnosis Date  . Breast cancer ([place]) 08/19/2012   LT LUMPECTOMY, radiation  . COPD (chronic obstructive pulmonary disease) ([place])   . Hailey-hailey disease   . Hyperlipemia   . Hypertension   . Lung cancer ([place]) 2014   RT LUNG, radiation and chemo  . Migraine   . Myocardial infarction ([place])   . Osteoporosis   . Radiation 2013   FOR BREAST CA  . Radiation 2014   FOR LUNG CA  . Renal artery stenosis ([place])     PAST SURGICAL HISTORY: Past Surgical History:  Procedure Laterality Date  . ABDOMINAL HYSTERECTOMY    . APPENDECTOMY    . BREAST BIOPSY Left 08/03/2012   positive, u/s guided bx  . BREAST LUMPECTOMY Left   . cardiac stents    . PARATHYROIDECTOMY    . TONSILLECTOMY      FAMILY HISTORY Family History  Problem Relation Age of Onset  . Breast cancer Sister 82       ADVANCED DIRECTIVES:    HEALTH MAINTENANCE: Social History  Substance Use Topics  . Smoking status: Current Every Day Smoker    Packs/day: 1.00    Years: 70.00  . Smokeless tobacco: Not on file  . Alcohol use No      Allergies  Allergen Reactions  . Bactrim [Sulfamethoxazole-Trimethoprim] Other (See Comments)    Reduced kidney function.  Per Dr. Holley Raring should not take  . Macrobid [employer] Macro] Other (See Comments)    Reduced Kidney function.  Should no longer take per Dr. Holley Raring  . Tape Rash    Current Outpatient Prescriptions  Medication Sig Dispense Refill  . albuterol (PROAIR HFA) 108 (90 Base) MCG/ACT inhaler     . amLODipine (NORVASC) 2.5 MG tablet Take 2.5 mg by mouth daily.    Marland Kitchen aspirin 325 MG tablet Take 325 mg by mouth daily.    Marland Kitchen azelastine (ASTELIN) 0.1 % nasal spray Place into the nose.    . Calcium Carbonate-Vitamin D (CALCIUM-VITAMIN D) 500-200 MG-UNIT per tablet Take 1 tablet by  mouth daily.    . fluticasone (FLONASE) 50 MCG/ACT nasal spray Place 2 sprays into both nostrils daily.    Marland Kitchen ketotifen (ZADITOR) 0.025 % ophthalmic solution 1 drop 2 (two) times daily.    Marland Kitchen letrozole (FEMARA) 2.5 MG tablet Take 1 tablet (2.5 mg total) by mouth daily. 90 tablet 2  . lisinopril (PRINIVIL,ZESTRIL) 20 MG tablet Take 20 mg by mouth daily.    . metoprolol succinate (TOPROL-XL) 50 MG 24 hr tablet Take 25 mg by mouth daily. Take with or immediately following a meal.    . Multiple Vitamin (MULTIVITAMIN)  tablet Take 1 tablet by mouth daily.    . simvastatin (ZOCOR) 40 MG tablet Take 40 mg by mouth daily.    . traMADol (ULTRAM) 50 MG tablet Take by mouth every 6 (six) hours as needed.     No current facility-administered medications for this visit.     OBJECTIVE: Vitals:   08/06/16 1046  BP: (!) 145/76  Pulse: 68  Resp: 18  Temp: 97 F (36.1 C)     Body mass index is 19.94 kg/m.    ECOG FS:0 - Asymptomatic  General: Well-developed, well-nourished, no acute distress. Eyes: Pink conjunctiva, anicteric sclera. Breasts: Bilateral breast and axilla without lumps or masses. Lungs: Clear to auscultation bilaterally. Heart: Regular rate and rhythm. No rubs, murmurs, or gallops. Abdomen: Soft, nontender, nondistended. No organomegaly noted, normoactive bowel sounds. Musculoskeletal: No edema, cyanosis, or clubbing. Neuro: Alert, answering all questions appropriately. Cranial nerves grossly intact. Skin: No rashes  Psych: Normal affect.   LAB RESULTS:  Lab Results  Component Value Date   NA 139 07/22/2016   K 5.4 (H) 07/22/2016   CL 105 07/22/2016   CO2 27 07/22/2016   GLUCOSE 102 (H) 07/22/2016   BUN 24 (H) 07/22/2016   CREATININE 1.29 (H) 07/22/2016   CALCIUM 8.9 07/22/2016   PROT 7.7 01/23/2016   ALBUMIN 4.2 01/23/2016   AST 19 01/23/2016   ALT 15 01/23/2016   ALKPHOS 82 01/23/2016   BILITOT 0.5 01/23/2016   GFRNONAA 36 (L) 07/22/2016   GFRAA 42 (L) 07/22/2016    Lab Results  Component Value Date   WBC 8.5 01/23/2016   NEUTROABS 6.1 01/23/2016   HGB 14.3 01/23/2016   HCT 43.7 01/23/2016   MCV 94.0 01/23/2016   PLT 249 01/23/2016     STUDIES: Ct Chest W Contrast  Result Date: 07/22/2016 CLINICAL DATA:  82 year old female with history of stage IA ER positive, all 50 PR HER-2 negative adenocarcinoma of the lower inner quadrant of the left breast and stage I IA adenocarcinoma of the right upper lobe of the lung. Followup study. EXAM: CT CHEST WITH CONTRAST  TECHNIQUE: Multidetector CT imaging of the chest was performed during intravenous contrast administration. CONTRAST:  49m ISOVUE-300 IOPAMIDOL (ISOVUE-300) INJECTION 61% COMPARISON:  Chest CT 07/09/2015. FINDINGS: Cardiovascular: Heart size is normal. There is no significant pericardial fluid, thickening or pericardial calcification. There is aortic atherosclerosis, as well as atherosclerosis of the great vessels of the mediastinum and the coronary arteries, including calcified atherosclerotic plaque in the left anterior descending, left circumflex and right coronary arteries. In addition, there is ectasia of the descending thoracic aorta which measures up to 3.8 cm in diameter. Mediastinum/Nodes: No pathologically enlarged mediastinal or hilar lymph nodes. Esophagus is unremarkable in appearance. No axillary lymphadenopathy. Lungs/Pleura: Enlarging 1 cm pulmonary nodule (image 118 of series 6) in the right lower lobe which has macro lobulated and slightly spiculated margins. Band-like area of  architectural distortion in the right upper lobe, which appears essentially unchanged compared to the prior examination from 07/09/2015, presumably an area of postradiation scarring. Upper Abdomen: Incompletely visualized low-attenuation lesion in the proximal body and head of the pancreas which currently measures 2.1 x 1.5 cm (image 68 of series 5), which appears slightly larger than prior examinations. Incompletely visualized infrarenal abdominal aortic aneurysm measuring up to 3.1 cm in diameter. Enlarged adrenal glands bilaterally with multiple nodules, which appear grossly similar to the prior examination, largest of which measures up to 1.8 cm in the posterior aspect of the left adrenal gland (partially calcified). Although incompletely characterized on today's examination, their stability compared to prior studies dating back to 09/07/2006 suggests benign lesions such as adenomas. Musculoskeletal: Small area of post  lumpectomy scarring in the subareolar aspect of the left breast. Chronic appearing compression fracture of T11 with approximately 30% loss of anterior vertebral body height. There are no aggressive appearing lytic or blastic lesions noted in the visualized portions of the skeleton. Mildly displaced incompletely healed fracture of the lateral aspect of the right third rib appears very similar to the prior examination from last year, immediately superficial to the postradiation changes in the underlying lung, likely to represent a pathologic fracture through an area affected by radiation osteitis/osteonecrosis. IMPRESSION: 1. Enlarging pulmonary nodule in the right lower lobe which currently measures 1 cm. This has an aggressive appearance, and could either represent a primary bronchogenic neoplasm, or metastatic lesion. Further evaluation with PET-CT is suggested in the near future. 2. Low-attenuation lesion in the proximal body and head of the pancreas which appears to demonstrate slow progressive enlargement over numerous serial prior examinations. 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 3. Aortic atherosclerosis, in addition to three-vessel coronary artery disease. In addition, there is ectasia of the descending thoracic aorta, and mild aneurysmal dilatation of the infrarenal abdominal aorta (3.1 cm in diameter). Recommend followup by ultrasound in 3 years. This recommendation follows ACR consensus guidelines: White Paper of the ACR Incidental Findings Committee II on Vascular Findings. J Am Coll Radiol 2013; 10:789-794. 4. Additional incidental findings, similar prior studies, as above. These results will be called to the ordering clinician or representative by the Radiologist Assistant, and communication documented in the PACS or zVision Dashboard.  Electronically Signed   By: Vinnie Langton M.D.   On: 07/22/2016 13:27   Nm Pet Image Initial (pi) Skull Base To Thigh  Result Date: 07/31/2016 CLINICAL DATA:  Subsequent treatment strategy for new right lower lobe pulmonary nodule detected on staging chest CT for this patient with a history of stage Ia right upper lobe lung adenocarcinoma in 2014 treated with radiation therapy and left breast cancer in 2013. EXAM: NUCLEAR MEDICINE PET SKULL BASE TO THIGH TECHNIQUE: 13.1 mCi F-18 FDG was injected intravenously. Full-ring PET imaging was performed from the skull base to thigh after the radiotracer. CT data was obtained and used for attenuation correction and anatomic localization. FASTING BLOOD GLUCOSE:  Value: 104 mg/dl COMPARISON:  08/17/2013 PET-CT and 07/22/2016 chest CT. FINDINGS: NECK No hypermetabolic lymph nodes in the neck. Mildly heterogeneous thyroid gland with no discrete thyroid nodules and no thyroid hypermetabolism. CHEST Left anterior descending, left circumflex and right coronary atherosclerosis. Atherosclerotic nonaneurysmal thoracic aorta. No pleural effusions. There is stable bandlike radiation fibrosis in the right upper lobe, with no associated hypermetabolism. Irregular right  lower lobe 1.0 cm pulmonary nodule (series 3/image 118) demonstrates hypermetabolism with max SUV 3.8. No acute consolidative airspace disease or additional significant pulmonary nodules. Hypermetabolic 0.8 cm right infrahilar node (series 3/image 97) with max SUV 3.4. No additional hypermetabolic hilar, mediastinal or axillary nodes. ABDOMEN/PELVIS No abnormal hypermetabolic activity within the liver, pancreas, adrenal glands, or spleen. No hypermetabolic lymph nodes in the abdomen or pelvis. Cystic 1.8 cm pancreatic lesion in the uncinate process (series 3/image 152) demonstrates no hypermetabolism. No appreciable main pancreatic duct dilation. Stable partially calcified 1.9 cm left adrenal adenoma back to  09/13/2012. Marked distal colonic diverticulosis. Atherosclerotic abdominal aorta with 3.6 cm infrarenal abdominal aortic aneurysm. SKELETON No focal hypermetabolic activity to suggest skeletal metastasis. IMPRESSION: 1. Hypermetabolic irregular 1.0 cm right lower lobe pulmonary nodule, consistent with either a metachronous primary bronchogenic carcinoma (favored) or pulmonary metastasis. 2. Hypermetabolic right infrahilar lymph node, consistent with ipsilateral hilar nodal metastasis. 3. No additional sites of hypermetabolic metastatic disease. 4. No metabolic evidence of local tumor recurrence in the right upper lung lobe. 5. Cystic 1.8 cm pancreatic lesion in the uncinate process is non hypermetabolic. No appreciable main pancreatic duct dilation. A follow-up pancreas protocol MRI abdomen without and with IV contrast is recommended for further characterization when clinically feasible. 6. Aortic atherosclerosis. Infrarenal 3.6 cm abdominal aortic aneurysm. Recommend followup by ultrasound in 2 years. This recommendation follows ACR consensus guidelines: White Paper of the ACR Incidental Findings Committee II on Vascular Findings. J Am Coll Radiol 2013; 10:789-794. 7. Additional findings include three-vessel coronary atherosclerosis, stable left adrenal adenoma and marked distal colonic diverticulosis. Electronically Signed   By: Ilona Sorrel M.D.   On: 07/31/2016 17:35   Dg Bone Density  Result Date: 07/22/2016 EXAM: DUAL X-RAY ABSORPTIOMETRY (DXA) FOR BONE MINERAL DENSITY IMPRESSION: Dear Dr Delight Hoh, Your patient Encarnacion Bole completed a BMD test on 07/22/2016 using the [place] (analysis version: 14.10) manufactured by [employer]. The following summarizes the results of our evaluation. PATIENT BIOGRAPHICAL: Name: Gayatri, Teasdale Patient ID: [id number] Birth Date: 03-Oct-1928 Height: 64.0 in. Gender: Female Exam Date: 07/22/2016 Weight: 115.0 lbs. Indications:  Advanced Age, Bilateral oopherectomy, Caucasian, Height Loss, high risk meds, hx breast ca, Hysterectomy, Low Body Weight, Osteoporotic, POSTmenopausal, Tobacco User Fractures: Treatments: aspirin, Calcium, femara, letrozole, multivitamin, PROAIR, prolia, Vitamin D ASSESSMENT: The BMD measured at Femur Neck Right is 0.630 g/cm2 with a T-score of -2.9. This patient is considered osteoporotic according to [facility]) criteria. The BMD measured at Forearm Radius 33% is 0.624 g/cm2 with a T-score of -2.9. This patient is considered osteoporotic according to [facility]) criteria. Lspine was excluded due to degenerative changes.Patient was not a candidate for FRAX assessment due to osteoporosis report. Site Region Measured Measured WHO Young Adult BMD Date       Age      Classification T-score DualFemur Neck Right 07/22/2016 86.5 Osteoporosis -2.9 0.630 g/cm2 Left Forearm Radius 33% 07/22/2016 86.5 Osteoporosis -2.9 0.624 g/cm2 World Health Organization [facility]) criteria for post-menopausal, Caucasian Women: Normal:       T-score at or above -1 SD Osteopenia:   T-score between -1 and -2.5 SD Osteoporosis: T-score at or below -2.5 SD RECOMMENDATIONS: [place] recommends that FDA-approved medical therapies be considered in postemenopausal women and men age 63 or older with a: 1. Hip or vertebral (clinical or morphometric) fracture. 2. T-score of < -2.5at the spine or hip. 3. Ten-year fracture probability by FRAX of  3% or greater for hip fracture or 20% or greater for major osteoporotic fracture. All treatment decisions require clinical judgment and consideration of individual patient factors, including patient preferences, co-morbidities, previous drug use, risk factors not captured in the FRAX model (e.g. falls, vitamin D deficiency, increased bone turnover, interval significant decline in bone density) and possible under - or over-estimation of fracture risk by  FRAX. All patients should ensure an adequate intake of dietary calcium (1200 mg/d) and vitamin D (800 IU daily) unless contraindicated. FOLLOW-UP: People with diagnosed cases of osteoporosis or at high risk for fracture should have regular bone mineral density tests. For patients eligible for Medicare, routine testing is allowed once every 2 years. The testing frequency can be increased to one year for patients who have rapidly progressing disease, those who are receiving or discontinuing medical therapy to restore bone mass, or have additional risk factors. I have reviewed this report, and agree with the above findings. [facility] Radiology Dear Dr Delight Hoh, Your patient Anquanette Bahner completed a BMD test on 07/22/2016 using the [place] (analysis version: 14.10) manufactured by [employer]. The following summarizes the results of our evaluation. PATIENT BIOGRAPHICAL: Name: Addylin, Manke Patient ID: [id number] Birth Date: [birth year] Height: 64.0 in. Gender: Female Exam Date: 07/22/2016 Weight: 115.0 lbs. Indications: Advanced Age, Bilateral oopherectomy, Caucasian, Height Loss, high risk meds, hx breast ca, Hysterectomy, Low Body Weight, Osteoporotic, POSTmenopausal, Tobacco User Fractures: Treatments: aspirin, Calcium, femara, letrozole, multivitamin, PROAIR, prolia, Vitamin D ASSESSMENT: The BMD measured at Femur Neck Right is 0.630 g/cm2 with a T-score of -2.9. This patient is considered osteoporotic according to [facility]) criteria. The BMD measured at Forearm Radius 33% is 0.624 g/cm2 with a T-score of -2.9. This patient is considered osteoporotic according to [facility]) criteria. Lspine was excluded due to degenerative changes.Patient was not a candidate for FRAX assessment due to osteoporosis report. Site Region Measured Measured WHO Young Adult BMD Date       Age      Classification T-score DualFemur Neck Right 07/22/2016  86.5 Osteoporosis -2.9 0.630 g/cm2 Left Forearm Radius 33% 07/22/2016 86.5 Osteoporosis -2.9 0.624 g/cm2 World Health Organization [facility]) criteria for post-menopausal, Caucasian Women: Normal:       T-score at or above -1 SD Osteopenia:   T-score between -1 and -2.5 SD Osteoporosis: T-score at or below -2.5 SD RECOMMENDATIONS: [place] recommends that FDA-approved medical therapies be considered in postemenopausal women and men age 36 or older with a: 1. Hip or vertebral (clinical or morphometric) fracture. 2. T-score of < -2.5at the spine or hip. 3. Ten-year fracture probability by FRAX of 3% or greater for hip fracture or 20% or greater for major osteoporotic fracture. All treatment decisions require clinical judgment and consideration of individual patient factors, including patient preferences, co-morbidities, previous drug use, risk factors not captured in the FRAX model (e.g. falls, vitamin D deficiency, increased bone turnover, interval significant decline in bone density) and possible under - or over-estimation of fracture risk by FRAX. All patients should ensure an adequate intake of dietary calcium (1200 mg/d) and vitamin D (800 IU daily) unless contraindicated. FOLLOW-UP: People with diagnosed cases of osteoporosis or at high risk for fracture should have regular bone mineral density tests. For patients eligible for Medicare, routine testing is allowed once every 2 years. The testing frequency can be increased to one year for patients who have rapidly progressing disease, those who are receiving or discontinuing medical  therapy to restore bone mass, or have additional risk factors. I have reviewed this report, and agree with the above findings. [facility] Radiology Electronically Signed   By: Lajean Manes M.D.   On: 07/22/2016 12:55   Mm Diag Breast Tomo Bilateral  Result Date: 07/17/2016 CLINICAL DATA:  82 year old female presenting for routine annual evaluation status post  left breast lumpectomy in 2013. EXAM: 2D DIGITAL DIAGNOSTIC BILATERAL MAMMOGRAM WITH CAD AND ADJUNCT TOMO COMPARISON:  Previous exam(s). ACR Breast Density Category b: There are scattered areas of fibroglandular density. FINDINGS: Stable left breast lumpectomy site. No suspicious calcifications, masses or areas of distortion are seen in the bilateral breasts. Mammographic images were processed with CAD. IMPRESSION: The left breast lumpectomy site is stable. No mammographic evidence of malignancy in the bilateral breasts. RECOMMENDATION: Diagnostic mammogram is suggested in 1 year. (Code:[id number]-01Y) I have discussed the findings and recommendations with the patient. Results were also provided in writing at the conclusion of the visit. If applicable, a reminder letter will be sent to the patient regarding the next appointment. BI-RADS CATEGORY  2: Benign. Electronically Signed   By: Ammie Ferrier M.D.   On: 07/17/2016 11:49    ASSESSMENT: Stage Ia ER positive, PR/HER-2 negative adenocarcinoma of the inner lower quadrant of the left breast, stage Ia adenocarcinoma of the right upper lobe lung, now with clinical stage Ia right lower lobe lesion.  PLAN:   1. Right lower lobe lung mass: PET scan results reviewed independently and reported as above. This is highly suspicious for malignancy. Likely a second primary, but possibly a recurrence of her right upper lobe lesion from 2 years ago. Referral was made to radiation oncology for further evaluation and consideration of XRT. Of note, patient was noted to have a mildly positive right infrahilar node also likely to be malignancy. Follow-up to be announced based on radiation oncology assessment. 2. 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  Oncotype testing was not performed as this would not change the  treatment plan.  Continue letrozle completing in August 2019.  Her most recent mammogram on July 17, 2016 was reported as BI-RADS 2 repeat in September 2018. Return to clinic in 6 months for routine evaluation. 3. Osteoporosis: Patient's most recent bone mineral density on July 22, 2016 and was reported as -2. 9, which is essentially unchanged from one year prior.  Continue Prolia, calcium and vitamin D.  4.  Stage Ia adenocarcinoma of the right upper lobe lung: Patient previously elected to proceed with XRT only. No intervention is needed at this time.  Patient is now nearly 2 years removed from completing her XRT. PET scan as above.   Patient expressed understanding and was in agreement with this plan. She also understands that She can call clinic at any time with any questions, concerns, or complaints.    Lloyd Huger, MD   08/06/2016 1:50 PM

## 2016-08-06 ENCOUNTER — Inpatient Hospital Stay (HOSPITAL_BASED_OUTPATIENT_CLINIC_OR_DEPARTMENT_OTHER): Payer: Medicare Other | Admitting: Oncology

## 2016-08-06 VITALS — BP 145/76 | HR 68 | Temp 97.0°F | Resp 18 | Wt 116.2 lb

## 2016-08-06 DIAGNOSIS — Z170 Estrogen receptor positive status [ER+]: Secondary | ICD-10-CM | POA: Diagnosis not present

## 2016-08-06 DIAGNOSIS — C3411 Malignant neoplasm of upper lobe, right bronchus or lung: Secondary | ICD-10-CM

## 2016-08-06 DIAGNOSIS — J449 Chronic obstructive pulmonary disease, unspecified: Secondary | ICD-10-CM

## 2016-08-06 DIAGNOSIS — Z79811 Long term (current) use of aromatase inhibitors: Secondary | ICD-10-CM | POA: Diagnosis not present

## 2016-08-06 DIAGNOSIS — M810 Age-related osteoporosis without current pathological fracture: Secondary | ICD-10-CM

## 2016-08-06 DIAGNOSIS — Z85118 Personal history of other malignant neoplasm of bronchus and lung: Secondary | ICD-10-CM

## 2016-08-06 DIAGNOSIS — C50312 Malignant neoplasm of lower-inner quadrant of left female breast: Secondary | ICD-10-CM | POA: Diagnosis not present

## 2016-08-06 DIAGNOSIS — F17210 Nicotine dependence, cigarettes, uncomplicated: Secondary | ICD-10-CM

## 2016-08-06 DIAGNOSIS — Z923 Personal history of irradiation: Secondary | ICD-10-CM

## 2016-08-06 DIAGNOSIS — I252 Old myocardial infarction: Secondary | ICD-10-CM

## 2016-08-06 DIAGNOSIS — Z7982 Long term (current) use of aspirin: Secondary | ICD-10-CM

## 2016-08-06 DIAGNOSIS — I10 Essential (primary) hypertension: Secondary | ICD-10-CM

## 2016-08-06 DIAGNOSIS — R918 Other nonspecific abnormal finding of lung field: Secondary | ICD-10-CM

## 2016-08-06 DIAGNOSIS — E785 Hyperlipidemia, unspecified: Secondary | ICD-10-CM

## 2016-08-06 DIAGNOSIS — Z9221 Personal history of antineoplastic chemotherapy: Secondary | ICD-10-CM

## 2016-08-06 DIAGNOSIS — Z79899 Other long term (current) drug therapy: Secondary | ICD-10-CM

## 2016-08-06 NOTE — Progress Notes (Signed)
States is feeling well. Offers no complaints. 

## 2016-08-11 ENCOUNTER — Encounter: Payer: Self-pay | Admitting: Radiation Oncology

## 2016-08-11 ENCOUNTER — Ambulatory Visit
Admission: RE | Admit: 2016-08-11 | Discharge: 2016-08-11 | Disposition: A | Discharge status: Home / Self Care | Payer: Medicare Other | Source: Ambulatory Visit | Attending: Radiation Oncology | Admitting: Radiation Oncology

## 2016-08-11 VITALS — BP 149/78 | HR 77 | Temp 98.2°F | Resp 20 | Wt 117.1 lb

## 2016-08-11 DIAGNOSIS — C3491 Malignant neoplasm of unspecified part of right bronchus or lung: Secondary | ICD-10-CM

## 2016-08-11 DIAGNOSIS — Z510 Encounter for antineoplastic radiation therapy: Secondary | ICD-10-CM | POA: Diagnosis not present

## 2016-08-11 DIAGNOSIS — C3432 Malignant neoplasm of lower lobe, left bronchus or lung: Secondary | ICD-10-CM | POA: Diagnosis not present

## 2016-08-11 NOTE — Progress Notes (Signed)
Radiation Oncology Follow up Note  Name: Special Ranes [facility]   Date:   08/11/2016 MRN:  [id number] DOB: Mar 25, 1928    This 82 y.o. female presents to the clinic today for old patient new area lung.  REFERRING PROVIDER: Ezequiel Kayser, MD  HPI: Patient is a 82 year old female well known to our department now out just under 3 years having completed radiation therapy to her right upper lobe for lung cancer. She's also had whole breast radiation to her left side over 3 years prior. She is seen today in routine follow-up. She is noted to have a small spiculated lesion which is hypermetabolic on PET CT scan in the right lower lobe consistent with a new primary lung cancer.. Lesion is 1 cm consistent with either metachronous primary brock adenocarcinoma or pulmonary metastasis. She also has a slightly hypermetabolic right infrahilar node. She clinically is doing well does have some shortness of breath and dyspnea on exertion and a nonproductive cough no hemoptysis. She's been seen by medical oncology is now referred to radiation oncology for opinion.  COMPLICATIONS OF TREATMENT: none  FOLLOW UP COMPLIANCE: keeps appointments   PHYSICAL EXAM:  BP (!) 149/78   Pulse 77   Temp 98.2 F (36.8 C)   Resp 20   Wt 117 lb 1 oz (53.1 kg)   BMI 20.09 kg/m  Well-developed elderly female in NAD. Well-developed well-nourished patient in NAD. HEENT reveals PERLA, EOMI, discs not visualized.  Oral cavity is clear. No oral mucosal lesions are identified. Neck is clear without evidence of cervical or supraclavicular adenopathy. Lungs are clear to A&P. Cardiac examination is essentially unremarkable with regular rate and rhythm without murmur rub or thrill. Abdomen is benign with no organomegaly or masses noted. Motor sensory and DTR levels are equal and symmetric in the upper and lower extremities. Cranial nerves II through XII are grossly intact. Proprioception is intact. No peripheral adenopathy or edema is  identified. No motor or sensory levels are noted. Crude visual fields are within normal range.  RADIOLOGY RESULTS: PET CT CT scans are reviewed  PLAN: Present time I believe were dealing with a new malignancy in her right lower lobe. I like to offer SB RT radiation therapy 5000 cGy in 5 fractions. We'll taken to consideration of prior radiation to her lung as well as breast in our treatment planning. I am not back concerned about the right hilar node of like to continue to observe that since incorporating adenopathy or would cause undue morbidity treating a significant amount of normal lung volume. I certainly at any time should that area progress could treat that also with image guided radiation therapy. Risks and benefits of treatment to her chest were reviewed with the patient and her daughter. I personally ordered CT simulation for later this week. Side effects such as possible cough possible dysphasia fatigue alteration of blood counts and skin reaction all were discussed in detail with the patient and her daughter. They all seem to comprehend my treatment plan well.  I would like to take this opportunity to thank you for allowing me to participate in the care of your patient.Armstead Peaks., MD

## 2016-08-12 ENCOUNTER — Ambulatory Visit
Admission: RE | Admit: 2016-08-12 | Discharge: 2016-08-12 | Disposition: A | Discharge status: Home / Self Care | Payer: Medicare Other | Source: Ambulatory Visit | Attending: Radiation Oncology | Admitting: Radiation Oncology

## 2016-08-12 DIAGNOSIS — C3432 Malignant neoplasm of lower lobe, left bronchus or lung: Secondary | ICD-10-CM | POA: Diagnosis not present

## 2016-08-13 DIAGNOSIS — C3432 Malignant neoplasm of lower lobe, left bronchus or lung: Secondary | ICD-10-CM | POA: Diagnosis not present

## 2016-08-14 DIAGNOSIS — R001 Bradycardia, unspecified: Secondary | ICD-10-CM | POA: Insufficient documentation

## 2016-08-14 DIAGNOSIS — C3432 Malignant neoplasm of lower lobe, left bronchus or lung: Secondary | ICD-10-CM | POA: Diagnosis not present

## 2016-08-18 DIAGNOSIS — C3432 Malignant neoplasm of lower lobe, left bronchus or lung: Secondary | ICD-10-CM | POA: Diagnosis not present

## 2016-08-19 DIAGNOSIS — C3432 Malignant neoplasm of lower lobe, left bronchus or lung: Secondary | ICD-10-CM | POA: Diagnosis not present

## 2016-08-27 ENCOUNTER — Ambulatory Visit
Admission: RE | Admit: 2016-08-27 | Discharge: 2016-08-27 | Disposition: A | Discharge status: Home / Self Care | Payer: Medicare Other | Source: Ambulatory Visit | Attending: Radiation Oncology | Admitting: Radiation Oncology

## 2016-08-27 DIAGNOSIS — C3432 Malignant neoplasm of lower lobe, left bronchus or lung: Secondary | ICD-10-CM | POA: Diagnosis not present

## 2016-08-29 ENCOUNTER — Ambulatory Visit
Admission: RE | Admit: 2016-08-29 | Discharge: 2016-08-29 | Disposition: A | Discharge status: Home / Self Care | Payer: Medicare Other | Source: Ambulatory Visit | Attending: Radiation Oncology | Admitting: Radiation Oncology

## 2016-08-29 DIAGNOSIS — C3432 Malignant neoplasm of lower lobe, left bronchus or lung: Secondary | ICD-10-CM | POA: Diagnosis not present

## 2016-09-01 ENCOUNTER — Ambulatory Visit
Admission: RE | Admit: 2016-09-01 | Discharge: 2016-09-01 | Disposition: A | Discharge status: Home / Self Care | Payer: Medicare Other | Source: Ambulatory Visit | Attending: Radiation Oncology | Admitting: Radiation Oncology

## 2016-09-01 DIAGNOSIS — C3432 Malignant neoplasm of lower lobe, left bronchus or lung: Secondary | ICD-10-CM | POA: Diagnosis not present

## 2016-09-03 ENCOUNTER — Ambulatory Visit
Admission: RE | Admit: 2016-09-03 | Discharge: 2016-09-03 | Disposition: A | Discharge status: Home / Self Care | Payer: Medicare Other | Source: Ambulatory Visit | Attending: Radiation Oncology | Admitting: Radiation Oncology

## 2016-09-03 DIAGNOSIS — C3432 Malignant neoplasm of lower lobe, left bronchus or lung: Secondary | ICD-10-CM | POA: Diagnosis not present

## 2016-09-08 ENCOUNTER — Ambulatory Visit
Admission: RE | Admit: 2016-09-08 | Discharge: 2016-09-08 | Disposition: A | Discharge status: Home / Self Care | Payer: Medicare Other | Source: Ambulatory Visit | Attending: Radiation Oncology | Admitting: Radiation Oncology

## 2016-09-08 ENCOUNTER — Inpatient Hospital Stay: Payer: Medicare Other | Attending: Oncology | Admitting: Oncology

## 2016-09-08 VITALS — BP 151/75 | HR 68 | Temp 97.7°F | Resp 18 | Wt 119.4 lb

## 2016-09-08 DIAGNOSIS — C50312 Malignant neoplasm of lower-inner quadrant of left female breast: Secondary | ICD-10-CM | POA: Diagnosis not present

## 2016-09-08 DIAGNOSIS — F17210 Nicotine dependence, cigarettes, uncomplicated: Secondary | ICD-10-CM | POA: Diagnosis not present

## 2016-09-08 DIAGNOSIS — I10 Essential (primary) hypertension: Secondary | ICD-10-CM | POA: Insufficient documentation

## 2016-09-08 DIAGNOSIS — Z170 Estrogen receptor positive status [ER+]: Secondary | ICD-10-CM | POA: Diagnosis not present

## 2016-09-08 DIAGNOSIS — Z7982 Long term (current) use of aspirin: Secondary | ICD-10-CM | POA: Diagnosis not present

## 2016-09-08 DIAGNOSIS — R911 Solitary pulmonary nodule: Secondary | ICD-10-CM | POA: Diagnosis not present

## 2016-09-08 DIAGNOSIS — Z923 Personal history of irradiation: Secondary | ICD-10-CM | POA: Insufficient documentation

## 2016-09-08 DIAGNOSIS — M810 Age-related osteoporosis without current pathological fracture: Secondary | ICD-10-CM | POA: Diagnosis not present

## 2016-09-08 DIAGNOSIS — C3411 Malignant neoplasm of upper lobe, right bronchus or lung: Secondary | ICD-10-CM

## 2016-09-08 DIAGNOSIS — J449 Chronic obstructive pulmonary disease, unspecified: Secondary | ICD-10-CM | POA: Insufficient documentation

## 2016-09-08 DIAGNOSIS — I252 Old myocardial infarction: Secondary | ICD-10-CM | POA: Diagnosis not present

## 2016-09-08 DIAGNOSIS — Z79899 Other long term (current) drug therapy: Secondary | ICD-10-CM | POA: Diagnosis not present

## 2016-09-08 DIAGNOSIS — Z85118 Personal history of other malignant neoplasm of bronchus and lung: Secondary | ICD-10-CM | POA: Diagnosis not present

## 2016-09-08 DIAGNOSIS — E785 Hyperlipidemia, unspecified: Secondary | ICD-10-CM | POA: Diagnosis not present

## 2016-09-08 DIAGNOSIS — C3432 Malignant neoplasm of lower lobe, left bronchus or lung: Secondary | ICD-10-CM | POA: Diagnosis not present

## 2016-09-08 DIAGNOSIS — Z79811 Long term (current) use of aromatase inhibitors: Secondary | ICD-10-CM | POA: Diagnosis not present

## 2016-09-08 NOTE — Progress Notes (Signed)
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  INTERVAL HISTORY: Patient returns to clinic today for further evaluation. She recently completed XRT to her right upper lobe of lung and tolerated her treatments well. She also continues to tolerate letrozole well without significant side effects. She has no neurologic complaints. She denies any recent fevers or illnesses. She has a good appetite and denies weight loss. She has no chest pain, shortness of breath, cough, or hemoptysis. She denies any nausea, vomiting, constipation, or diarrhea. She has no urinary complaints. Patient offers no specific complaints today.    REVIEW OF SYSTEMS:   Review of Systems  Constitutional: Negative.  Negative for fever, malaise/fatigue and weight loss.  Respiratory: Negative.  Negative for cough and shortness of breath.   Cardiovascular: Negative.  Negative for chest pain.  Gastrointestinal: Negative.  Negative for abdominal pain.  Genitourinary: Negative.   Musculoskeletal: Negative.   Skin: Negative.   Neurological: Negative.  Negative for weakness.  Psychiatric/Behavioral: Negative.  The patient is not nervous/anxious.     As per HPI. Otherwise, a complete review of systems is negative.  PAST MEDICAL HISTORY: Past Medical History:  Diagnosis Date  . Breast cancer ([place]) 08/19/2012   LT LUMPECTOMY, radiation  . COPD (chronic obstructive pulmonary disease) ([place])   . Hailey-hailey disease   . Hyperlipemia   . Hypertension   . Lung cancer ([place]) 2014   RT LUNG, radiation and chemo  . Migraine   . Myocardial infarction   . Osteoporosis   .  Radiation 2013   FOR BREAST CA  . Radiation 2014   FOR LUNG CA  . Renal artery stenosis ([place])     PAST SURGICAL HISTORY: Past Surgical History:  Procedure Laterality Date  . ABDOMINAL HYSTERECTOMY    . APPENDECTOMY    . BREAST BIOPSY Left 08/03/2012   positive, u/s guided bx  . BREAST LUMPECTOMY Left   . cardiac stents    . PARATHYROIDECTOMY    . TONSILLECTOMY      FAMILY HISTORY Family History  Problem Relation Age of Onset  . Breast cancer Sister 69       ADVANCED DIRECTIVES:    HEALTH MAINTENANCE: Social History  Substance Use Topics  . Smoking status: Current Every Day Smoker    Packs/day: 1.00    Years: 70.00  . Smokeless tobacco: Not on file  . Alcohol use No      Allergies  Allergen Reactions  . Bactrim [Sulfamethoxazole-Trimethoprim] Other (See Comments)    Reduced kidney function.  Per Dr. Holley Raring should not take  . Macrobid [employer] Macro] Other (See Comments)    Reduced Kidney function.  Should no longer take per Dr. Holley Raring  . Tape Rash    Current Outpatient Prescriptions  Medication Sig Dispense Refill  . albuterol (PROAIR HFA) 108 (90 Base) MCG/ACT inhaler     . amLODipine (NORVASC) 2.5 MG tablet Take 2.5 mg by mouth daily.    Marland Kitchen aspirin 325 MG tablet Take 325 mg by mouth daily.    Marland Kitchen azelastine (ASTELIN) 0.1 % nasal spray Place into the nose.    . Calcium Carbonate-Vitamin D (CALCIUM-VITAMIN D) 500-200 MG-UNIT per tablet  Take 1 tablet by mouth daily.    . fluticasone (FLONASE) 50 MCG/ACT nasal spray Place 2 sprays into both nostrils daily.    Marland Kitchen ketotifen (ZADITOR) 0.025 % ophthalmic solution 1 drop 2 (two) times daily.    Marland Kitchen letrozole (FEMARA) 2.5 MG tablet Take 1 tablet (2.5 mg total) by mouth daily. 90 tablet 2  . lisinopril (PRINIVIL,ZESTRIL) 20 MG tablet Take 20 mg by mouth daily.    . metoprolol succinate (TOPROL-XL) 50 MG 24 hr tablet Take 25 mg by mouth daily. Take with or immediately following a meal.    . Multiple  Vitamin (MULTIVITAMIN) tablet Take 1 tablet by mouth daily.    . simvastatin (ZOCOR) 40 MG tablet Take 40 mg by mouth daily.    . traMADol (ULTRAM) 50 MG tablet Take by mouth every 6 (six) hours as needed.     No current facility-administered medications for this visit.     OBJECTIVE: Vitals:   09/08/16 1025  BP: (!) 151/75  Pulse: 68  Resp: 18  Temp: 97.7 F (36.5 C)     Body mass index is 20.49 kg/m.    ECOG FS:0 - Asymptomatic  General: Well-developed, well-nourished, no acute distress. Eyes: Pink conjunctiva, anicteric sclera. Breasts: Bilateral breast and axilla without lumps or masses. Exam deferred today. Lungs: Clear to auscultation bilaterally. Heart: Regular rate and rhythm. No rubs, murmurs, or gallops. Abdomen: Soft, nontender, nondistended. No organomegaly noted, normoactive bowel sounds. Musculoskeletal: No edema, cyanosis, or clubbing. Neuro: Alert, answering all questions appropriately. Cranial nerves grossly intact. Skin: No rashes  Psych: Normal affect.   LAB RESULTS:  Lab Results  Component Value Date   NA 139 07/22/2016   K 5.4 (H) 07/22/2016   CL 105 07/22/2016   CO2 27 07/22/2016   GLUCOSE 102 (H) 07/22/2016   BUN 24 (H) 07/22/2016   CREATININE 1.29 (H) 07/22/2016   CALCIUM 8.9 07/22/2016   PROT 7.7 01/23/2016   ALBUMIN 4.2 01/23/2016   AST 19 01/23/2016   ALT 15 01/23/2016   ALKPHOS 82 01/23/2016   BILITOT 0.5 01/23/2016   GFRNONAA 36 (L) 07/22/2016   GFRAA 42 (L) 07/22/2016    Lab Results  Component Value Date   WBC 8.5 01/23/2016   NEUTROABS 6.1 01/23/2016   HGB 14.3 01/23/2016   HCT 43.7 01/23/2016   MCV 94.0 01/23/2016   PLT 249 01/23/2016     STUDIES: No results found.  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  PLAN:   1. Right lower lobe lung  mass: PET scan results reviewed independently. This is highly suspicious for malignancy. Likely a second primary, but possibly a recurrence of her right upper lobe lesion from 2 years ago. Patient has now completed XRT. No further intervention is needed at this time. Given patient's advanced age, would not recommend chemotherapy. Return to clinic in 3 months with repeat CT scan and further evaluation.  2. 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  Oncotype testing was not performed as this would not change the treatment plan.  Continue letrozle completing in August 2019.  Her most recent mammogram on July 17, 2016 was reported as BI-RADS 2 repeat in September 2018. 3. Osteoporosis:  Patient's most recent bone mineral density on July 22, 2016 and was reported as -2. 9, which is essentially unchanged from one year prior.  Continue Prolia, calcium and vitamin D.  4.  Stage Ia adenocarcinoma of the right upper lobe lung: Patient previously elected to proceed with XRT only. No intervention is needed at this time.  Patient is now nearly 2 years removed from completing her XRT. PET scan as above.   Patient expressed understanding and was in agreement with this plan. She also understands that She can call clinic at any time with any questions, concerns, or complaints.    Lloyd Huger, MD   09/13/2016 9:08 AM

## 2016-09-08 NOTE — Progress Notes (Signed)
Has finished radiation treatments. Feeling well today. Offers no complaints.

## 2016-09-09 ENCOUNTER — Ambulatory Visit: Payer: Medicare Other | Admitting: Oncology

## 2016-10-08 ENCOUNTER — Ambulatory Visit: Payer: Medicare Other | Admitting: Radiation Oncology

## 2016-10-21 ENCOUNTER — Encounter: Payer: Self-pay | Admitting: Radiation Oncology

## 2016-10-21 ENCOUNTER — Ambulatory Visit
Admission: RE | Admit: 2016-10-21 | Discharge: 2016-10-21 | Disposition: A | Discharge status: Home / Self Care | Payer: Medicare Other | Source: Ambulatory Visit | Attending: Radiation Oncology | Admitting: Radiation Oncology

## 2016-10-21 VITALS — BP 112/67 | HR 81 | Temp 96.9°F | Resp 20 | Wt 117.9 lb

## 2016-10-21 DIAGNOSIS — Z85118 Personal history of other malignant neoplasm of bronchus and lung: Secondary | ICD-10-CM | POA: Diagnosis not present

## 2016-10-21 DIAGNOSIS — R05 Cough: Secondary | ICD-10-CM | POA: Insufficient documentation

## 2016-10-21 DIAGNOSIS — M129 Arthropathy, unspecified: Secondary | ICD-10-CM | POA: Diagnosis not present

## 2016-10-21 DIAGNOSIS — Z923 Personal history of irradiation: Secondary | ICD-10-CM | POA: Insufficient documentation

## 2016-10-21 DIAGNOSIS — C3491 Malignant neoplasm of unspecified part of right bronchus or lung: Secondary | ICD-10-CM

## 2016-10-21 DIAGNOSIS — C3431 Malignant neoplasm of lower lobe, right bronchus or lung: Secondary | ICD-10-CM | POA: Insufficient documentation

## 2016-10-21 DIAGNOSIS — Z853 Personal history of malignant neoplasm of breast: Secondary | ICD-10-CM | POA: Insufficient documentation

## 2016-10-21 NOTE — Progress Notes (Signed)
Radiation Oncology Follow up Note  Name: [name] [name] [facility]   Date:   10/21/2016 MRN:  [id number] DOB: 03/06/1928    This 82 y.o. female presents to the clinic today for one-month follow-up status post SB RT to right lower lobe  REFERRING PROVIDER: Ezequiel Kayser, MD  HPI: Patient is a 82 year old female. Briefly treated to her right upper lobe for non-small cell lung cancer as well as her left breast for breast cancer now out 1 month having completed radiation therapy to her right lower lobe for a new primary lung cancer. Seen today in routine follow-up she is doing well her major complaint is arthritis in her back she only takes Tylenol for that. She specifically denies hemoptysis or chest tightness she does have a slight intermittent dry cough. She scheduled for follow-up CT scan in January.  COMPLICATIONS OF TREATMENT: none  FOLLOW UP COMPLIANCE: keeps appointments   PHYSICAL EXAM:  BP 112/67   Pulse 81   Temp (!) 96.9 F (36.1 C)   Resp 20   Wt 117 lb 15.1 oz (53.5 kg)   BMI 20.25 kg/m  Elderly female in NAD. Well-developed well-nourished patient in NAD. HEENT reveals PERLA, EOMI, discs not visualized.  Oral cavity is clear. No oral mucosal lesions are identified. Neck is clear without evidence of cervical or supraclavicular adenopathy. Lungs are clear to A&P. Cardiac examination is essentially unremarkable with regular rate and rhythm without murmur rub or thrill. Abdomen is benign with no organomegaly or masses noted. Motor sensory and DTR levels are equal and symmetric in the upper and lower extremities. Cranial nerves II through XII are grossly intact. Proprioception is intact. No peripheral adenopathy or edema is identified. No motor or sensory levels are noted. Crude visual fields are within normal range.  RADIOLOGY RESULTS: January CT scan will be reviewed  PLAN: At the present time she is doing well. Recovering nicely with out significant side effect. I'm please were  overall progress. I've asked to see her back in 3-4 months and will review her CT scans in January. Will probably get another 16 months out from then. Patient continues close follow-up care with Dr. Grayland Ormond. Patient knows to call with any concerns.  I would like to take this opportunity to thank you for allowing me to participate in the care of your patient.Armstead Peaks., MD

## 2016-10-27 ENCOUNTER — Other Ambulatory Visit: Payer: Self-pay | Admitting: Oncology

## 2016-12-02 ENCOUNTER — Inpatient Hospital Stay: Payer: Medicare Other | Attending: Internal Medicine

## 2016-12-02 ENCOUNTER — Other Ambulatory Visit: Payer: Medicare Other

## 2016-12-02 ENCOUNTER — Ambulatory Visit
Admission: RE | Admit: 2016-12-02 | Discharge: 2016-12-02 | Disposition: A | Discharge status: Home / Self Care | Payer: Medicare Other | Source: Ambulatory Visit | Attending: Oncology | Admitting: Oncology

## 2016-12-02 DIAGNOSIS — I714 Abdominal aortic aneurysm, without rupture: Secondary | ICD-10-CM | POA: Insufficient documentation

## 2016-12-02 DIAGNOSIS — G4700 Insomnia, unspecified: Secondary | ICD-10-CM | POA: Diagnosis not present

## 2016-12-02 DIAGNOSIS — M810 Age-related osteoporosis without current pathological fracture: Secondary | ICD-10-CM | POA: Insufficient documentation

## 2016-12-02 DIAGNOSIS — Z170 Estrogen receptor positive status [ER+]: Secondary | ICD-10-CM | POA: Diagnosis not present

## 2016-12-02 DIAGNOSIS — Z803 Family history of malignant neoplasm of breast: Secondary | ICD-10-CM | POA: Insufficient documentation

## 2016-12-02 DIAGNOSIS — F17210 Nicotine dependence, cigarettes, uncomplicated: Secondary | ICD-10-CM | POA: Insufficient documentation

## 2016-12-02 DIAGNOSIS — R911 Solitary pulmonary nodule: Secondary | ICD-10-CM | POA: Insufficient documentation

## 2016-12-02 DIAGNOSIS — Z923 Personal history of irradiation: Secondary | ICD-10-CM | POA: Insufficient documentation

## 2016-12-02 DIAGNOSIS — J439 Emphysema, unspecified: Secondary | ICD-10-CM | POA: Insufficient documentation

## 2016-12-02 DIAGNOSIS — Z79899 Other long term (current) drug therapy: Secondary | ICD-10-CM | POA: Insufficient documentation

## 2016-12-02 DIAGNOSIS — C3411 Malignant neoplasm of upper lobe, right bronchus or lung: Secondary | ICD-10-CM | POA: Insufficient documentation

## 2016-12-02 DIAGNOSIS — J449 Chronic obstructive pulmonary disease, unspecified: Secondary | ICD-10-CM | POA: Insufficient documentation

## 2016-12-02 DIAGNOSIS — Z85118 Personal history of other malignant neoplasm of bronchus and lung: Secondary | ICD-10-CM | POA: Insufficient documentation

## 2016-12-02 DIAGNOSIS — C50312 Malignant neoplasm of lower-inner quadrant of left female breast: Secondary | ICD-10-CM | POA: Diagnosis not present

## 2016-12-02 DIAGNOSIS — K5900 Constipation, unspecified: Secondary | ICD-10-CM | POA: Diagnosis not present

## 2016-12-02 DIAGNOSIS — R200 Anesthesia of skin: Secondary | ICD-10-CM | POA: Insufficient documentation

## 2016-12-02 DIAGNOSIS — I252 Old myocardial infarction: Secondary | ICD-10-CM | POA: Insufficient documentation

## 2016-12-02 DIAGNOSIS — Z79811 Long term (current) use of aromatase inhibitors: Secondary | ICD-10-CM | POA: Insufficient documentation

## 2016-12-02 DIAGNOSIS — Z7982 Long term (current) use of aspirin: Secondary | ICD-10-CM | POA: Diagnosis not present

## 2016-12-02 LAB — CREATININE, SERUM
Creatinine, Ser: 1.32 mg/dL — ABNORMAL HIGH (ref 0.44–1.00)
GFR calc non Af Amer: 35 mL/min — ABNORMAL LOW (ref >60)
GFR, EST AFRICAN AMERICAN: 41 mL/min — AB (ref >60)

## 2016-12-02 MED ORDER — IOPAMIDOL (ISOVUE-300) INJECTION 61%
75.0000 mL | Freq: Once | INTRAVENOUS | Status: AC | PRN
  Administered 2016-12-02: 75 mL via INTRAVENOUS

## 2016-12-05 ENCOUNTER — Ambulatory Visit: Payer: Medicare Other | Admitting: Oncology

## 2016-12-11 NOTE — Progress Notes (Signed)
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  INTERVAL HISTORY: Patient returns to clinic today for further evaluation. She completed XRT to her right upper lobe of lung on September 08, 2016, and tolerated her treatments well. She also continues to tolerate letrozole well without significant side effects.  She reports her toes are numb at night in bed, otherwise she has no neurologic complaints. She denies any recent fevers or illnesses. She reports a fair appetite, denies weight loss.  She reports shortness of breath with moderate activity, relieved with rest.  She has no chest pain, cough, or hemoptysis.  She reports occasional constipation, resolved with OTC stool softener.  She denies any nausea, vomiting, or diarrhea. She has no urinary complaints.  She reports difficulty getting back to sleep once awakened.  Patient offers no further specific complaints today.    REVIEW OF SYSTEMS:   Review of Systems  Constitutional: Negative.  Negative for fever, malaise/fatigue and weight loss.  HENT: Negative for congestion.   Respiratory: Positive for shortness of breath. Negative for cough.   Cardiovascular: Negative.  Negative for chest pain.  Gastrointestinal: Positive for constipation. Negative for abdominal pain, blood in stool, diarrhea, nausea and vomiting.  Genitourinary: Negative.   Musculoskeletal: Negative.   Skin: Negative.   Neurological: Positive for sensory change. Negative for tingling, weakness and headaches.  Psychiatric/Behavioral: The patient has insomnia. The patient is not nervous/anxious.     As per HPI. Otherwise, a  complete review of systems is negative.  PAST MEDICAL HISTORY: Past Medical History:  Diagnosis Date  . Breast cancer ([place]) 08/19/2012   LT LUMPECTOMY, radiation  . COPD (chronic obstructive pulmonary disease) ([place])   . Hailey-hailey disease   . Hyperlipemia   . Hypertension   . Lung cancer ([place]) 2014   RT LUNG, radiation and chemo  . Migraine   . Myocardial infarction   . Osteoporosis   . Radiation 2013   FOR BREAST CA  . Radiation 2014   FOR LUNG CA  . Renal artery stenosis ([place])     PAST SURGICAL HISTORY: Past Surgical History:  Procedure Laterality Date  . ABDOMINAL HYSTERECTOMY    . APPENDECTOMY    . BREAST BIOPSY Left 08/03/2012   positive, u/s guided bx  . BREAST LUMPECTOMY Left   . cardiac stents    . PARATHYROIDECTOMY    . TONSILLECTOMY      FAMILY HISTORY Family History  Problem Relation Age of Onset  . Breast cancer Sister 49       ADVANCED DIRECTIVES:    HEALTH MAINTENANCE: Social History  Substance Use Topics  . Smoking status: Current Every Day Smoker    Packs/day: 1.00    Years: 70.00  . Smokeless tobacco: Not on file  . Alcohol use No      Allergies  Allergen Reactions  . Bactrim [Sulfamethoxazole-Trimethoprim] Other (See Comments)    Reduced kidney function.  Per Dr. Holley Raring should not take  . Macrobid [employer] Macro] Other (See Comments)    Reduced Kidney function.  Should no longer take per Dr. Holley Raring  . Tape Rash    Current Outpatient Prescriptions  Medication Sig Dispense Refill  .  albuterol (PROAIR HFA) 108 (90 Base) MCG/ACT inhaler     . amLODipine (NORVASC) 2.5 MG tablet Take 2.5 mg by mouth daily.    Marland Kitchen aspirin 325 MG tablet Take 325 mg by mouth daily.    Marland Kitchen azelastine (ASTELIN) 0.1 % nasal spray Place into the nose.    . Calcium Carbonate-Vitamin D (CALCIUM-VITAMIN D) 500-200 MG-UNIT per tablet Take 1 tablet by mouth daily.    . fluticasone (FLONASE) 50 MCG/ACT nasal spray Place 2 sprays into both nostrils  daily.    Marland Kitchen ketotifen (ZADITOR) 0.025 % ophthalmic solution 1 drop 2 (two) times daily.    Marland Kitchen letrozole (FEMARA) 2.5 MG tablet Take 1 tablet (2.5 mg total) by mouth daily. 90 tablet 2  . letrozole (FEMARA) 2.5 MG tablet TAKE 1 TABLET DAILY 90 tablet 2  . lisinopril (PRINIVIL,ZESTRIL) 20 MG tablet Take 20 mg by mouth daily.    . metoprolol succinate (TOPROL-XL) 50 MG 24 hr tablet Take 25 mg by mouth daily. Take with or immediately following a meal.    . Multiple Vitamin (MULTIVITAMIN) tablet Take 1 tablet by mouth daily.    . simvastatin (ZOCOR) 40 MG tablet Take 40 mg by mouth daily.    . traMADol (ULTRAM) 50 MG tablet Take by mouth every 6 (six) hours as needed.     No current facility-administered medications for this visit.     OBJECTIVE: Vitals:   12/12/16 1053  BP: 116/73  Pulse: 77  Resp: 18  Temp: (!) 96.9 F (36.1 C)     Body mass index is 20.08 kg/m.    ECOG FS:0 - Asymptomatic  General: Well-developed, well-nourished, no acute distress. Eyes: Pink conjunctiva, anicteric sclera. Breasts: Bilateral breast and axilla without lumps or masses. Exam deferred today. Lungs: Clear to auscultation bilaterally. Heart: Regular rate and rhythm. No rubs, murmurs, or gallops. Abdomen: Soft, nontender, nondistended. No organomegaly noted, normoactive bowel sounds. Musculoskeletal: No edema, cyanosis, or clubbing. Neuro: Alert, answering all questions appropriately. Cranial nerves grossly intact. Skin: No rashes  Psych: Normal affect.   LAB RESULTS:  Lab Results  Component Value Date   NA 139 07/22/2016   K 5.4 (H) 07/22/2016   CL 105 07/22/2016   CO2 27 07/22/2016   GLUCOSE 102 (H) 07/22/2016   BUN 24 (H) 07/22/2016   CREATININE 1.32 (H) 12/02/2016   CALCIUM 8.9 07/22/2016   PROT 7.7 01/23/2016   ALBUMIN 4.2 01/23/2016   AST 19 01/23/2016   ALT 15 01/23/2016   ALKPHOS 82 01/23/2016   BILITOT 0.5 01/23/2016   GFRNONAA 35 (L) 12/02/2016   GFRAA 41 (L) 12/02/2016     Lab Results  Component Value Date   WBC 8.5 01/23/2016   NEUTROABS 6.1 01/23/2016   HGB 14.3 01/23/2016   HCT 43.7 01/23/2016   MCV 94.0 01/23/2016   PLT 249 01/23/2016     STUDIES: Ct Chest W Contrast  Result Date: 12/02/2016 CLINICAL DATA:  Followup right upper lobe lung carcinoma. Previous radiation therapy and chemotherapy. Right lower lobe pulmonary nodule. Personal history of right breast carcinoma. EXAM: CT CHEST WITH CONTRAST TECHNIQUE: Multidetector CT imaging of the chest was performed during intravenous contrast administration. CONTRAST:  62m ISOVUE-300 IOPAMIDOL (ISOVUE-300) INJECTION 61% COMPARISON:  CT on 07/22/2016 FINDINGS: Cardiovascular: No acute findings. Aortic and coronary artery atherosclerosis. Mediastinum/Nodes: No pathologically enlarged lymph nodes identified. Tiny sub-cm bilateral thyroid nodules remain stable. Lungs/Pleura: Mild emphysema. Stable geographic opacity in right upper lobe, consistent with post radiation changes. Decreased size of right  lower lobe pulmonary nodule, currently measuring 5 mm on image 116/3 compared to 10 mm on previous exam. No evidence of pulmonary infiltrate or pleural effusion. Upper Abdomen: Stable low-attenuation bilateral adrenal masses, consistent with benign adrenal adenomas. 1.7 cm cystic lesion in pancreatic head shows no significant change. Mild hepatic steatosis with focal fatty sparing adjacent gallbladder fossa. 3.0 cm infrarenal abdominal aortic aneurysm, without significant change. Musculoskeletal:  No suspicious bone lesions. IMPRESSION: Decreased size of right lower lobe pulmonary nodule since prior study. No new or progressive disease identified. Stable mild emphysema and right upper lobe radiation changes. No significant change in cystic lesion within pancreatic head ; Low-grade cystic pancreatic neoplasm cannot be excluded. Stable 3.0 cm infrarenal abdominal aortic aneurysm. Recommend continued attention on follow-up  imaging. Electronically Signed   By: Earle Gell M.D.   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  PLAN:   1. Right lower lobe lung mass: CT scan results reviewed independently and reported as above. Patient has now completed XRT. No further intervention is needed at this time. Given patient's advanced age, would not recommend chemotherapy. Return to clinic in 6 months with repeat CT scan and further evaluation.  2. 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  Oncotype testing was not performed as this would not change the treatment plan. Continue letrozole completing in August 2019. Her most recent mammogram on July 17, 2016 was reported as BI-RADS 2.  Repeat in September 2018. 3. Osteoporosis: Patient's most recent bone mineral density on July 22, 2016 and was reported as -2. 9, which is essentially unchanged from one year prior.  Continue Prolia, calcium and vitamin D. Repeat scan in September 2018. 4.  Stage Ia adenocarcinoma of the right upper lobe lung: Patient previously elected to proceed with XRT only. No intervention is needed at this time.  Patient is now nearly 2 years removed from completing her XRT. CT scan as above.  5. Insomnia: Discussed 4-7-8 breathing technique at bedtime: breathe in to count of 4, hold breath for count of 7, exhale for count of 8; do 3-5 times for letting go of overactive thoughts.  Provided breath ratio chart to patient for relaxing, balancing, and energizing breathing techniques.   Patient expressed understanding and was in agreement with this plan. She also understands that She can call clinic at any time  with any questions, concerns, or complaints.   Lucendia Herrlich, NP  12/13/16 4:38 PM   Patient was seen and evaluated independently and I agree with the assessment and plan as dictated above.  Lloyd Huger, MD 12/16/16 6:30 PM

## 2016-12-12 ENCOUNTER — Inpatient Hospital Stay (HOSPITAL_BASED_OUTPATIENT_CLINIC_OR_DEPARTMENT_OTHER): Payer: Medicare Other | Admitting: Oncology

## 2016-12-12 VITALS — BP 116/73 | HR 77 | Temp 96.9°F | Resp 18 | Wt 117.0 lb

## 2016-12-12 DIAGNOSIS — C3411 Malignant neoplasm of upper lobe, right bronchus or lung: Secondary | ICD-10-CM

## 2016-12-12 DIAGNOSIS — Z79811 Long term (current) use of aromatase inhibitors: Secondary | ICD-10-CM | POA: Diagnosis not present

## 2016-12-12 DIAGNOSIS — Z85118 Personal history of other malignant neoplasm of bronchus and lung: Secondary | ICD-10-CM

## 2016-12-12 DIAGNOSIS — R200 Anesthesia of skin: Secondary | ICD-10-CM

## 2016-12-12 DIAGNOSIS — C50312 Malignant neoplasm of lower-inner quadrant of left female breast: Secondary | ICD-10-CM

## 2016-12-12 DIAGNOSIS — J449 Chronic obstructive pulmonary disease, unspecified: Secondary | ICD-10-CM

## 2016-12-12 DIAGNOSIS — M810 Age-related osteoporosis without current pathological fracture: Secondary | ICD-10-CM

## 2016-12-12 DIAGNOSIS — I252 Old myocardial infarction: Secondary | ICD-10-CM

## 2016-12-12 DIAGNOSIS — K5900 Constipation, unspecified: Secondary | ICD-10-CM

## 2016-12-12 DIAGNOSIS — Z7982 Long term (current) use of aspirin: Secondary | ICD-10-CM

## 2016-12-12 DIAGNOSIS — G4700 Insomnia, unspecified: Secondary | ICD-10-CM

## 2016-12-12 DIAGNOSIS — F17210 Nicotine dependence, cigarettes, uncomplicated: Secondary | ICD-10-CM

## 2016-12-12 DIAGNOSIS — Z170 Estrogen receptor positive status [ER+]: Secondary | ICD-10-CM

## 2016-12-12 DIAGNOSIS — Z79899 Other long term (current) drug therapy: Secondary | ICD-10-CM

## 2016-12-12 DIAGNOSIS — Z923 Personal history of irradiation: Secondary | ICD-10-CM

## 2016-12-12 DIAGNOSIS — Z803 Family history of malignant neoplasm of breast: Secondary | ICD-10-CM

## 2016-12-12 DIAGNOSIS — R911 Solitary pulmonary nodule: Secondary | ICD-10-CM

## 2017-01-09 ENCOUNTER — Encounter: Payer: Self-pay | Admitting: *Deleted

## 2017-01-23 ENCOUNTER — Inpatient Hospital Stay: Payer: Medicare Other | Attending: Oncology | Admitting: Oncology

## 2017-01-23 ENCOUNTER — Ambulatory Visit: Payer: Medicare Other | Admitting: Oncology

## 2017-01-23 VITALS — BP 133/75 | HR 67 | Temp 97.1°F | Resp 18 | Wt 115.1 lb

## 2017-01-23 DIAGNOSIS — J449 Chronic obstructive pulmonary disease, unspecified: Secondary | ICD-10-CM | POA: Diagnosis not present

## 2017-01-23 DIAGNOSIS — I252 Old myocardial infarction: Secondary | ICD-10-CM | POA: Insufficient documentation

## 2017-01-23 DIAGNOSIS — Z79811 Long term (current) use of aromatase inhibitors: Secondary | ICD-10-CM

## 2017-01-23 DIAGNOSIS — I10 Essential (primary) hypertension: Secondary | ICD-10-CM | POA: Diagnosis not present

## 2017-01-23 DIAGNOSIS — Z803 Family history of malignant neoplasm of breast: Secondary | ICD-10-CM

## 2017-01-23 DIAGNOSIS — Z882 Allergy status to sulfonamides status: Secondary | ICD-10-CM | POA: Diagnosis not present

## 2017-01-23 DIAGNOSIS — Z923 Personal history of irradiation: Secondary | ICD-10-CM | POA: Diagnosis not present

## 2017-01-23 DIAGNOSIS — Z7982 Long term (current) use of aspirin: Secondary | ICD-10-CM | POA: Diagnosis not present

## 2017-01-23 DIAGNOSIS — E785 Hyperlipidemia, unspecified: Secondary | ICD-10-CM

## 2017-01-23 DIAGNOSIS — C50312 Malignant neoplasm of lower-inner quadrant of left female breast: Secondary | ICD-10-CM | POA: Diagnosis present

## 2017-01-23 DIAGNOSIS — R911 Solitary pulmonary nodule: Secondary | ICD-10-CM | POA: Insufficient documentation

## 2017-01-23 DIAGNOSIS — Z79899 Other long term (current) drug therapy: Secondary | ICD-10-CM | POA: Insufficient documentation

## 2017-01-23 DIAGNOSIS — K5900 Constipation, unspecified: Secondary | ICD-10-CM | POA: Diagnosis not present

## 2017-01-23 DIAGNOSIS — Z170 Estrogen receptor positive status [ER+]: Secondary | ICD-10-CM | POA: Diagnosis not present

## 2017-01-23 DIAGNOSIS — F17210 Nicotine dependence, cigarettes, uncomplicated: Secondary | ICD-10-CM | POA: Insufficient documentation

## 2017-01-23 DIAGNOSIS — M810 Age-related osteoporosis without current pathological fracture: Secondary | ICD-10-CM | POA: Diagnosis not present

## 2017-01-23 DIAGNOSIS — Z85118 Personal history of other malignant neoplasm of bronchus and lung: Secondary | ICD-10-CM

## 2017-01-23 DIAGNOSIS — C3411 Malignant neoplasm of upper lobe, right bronchus or lung: Secondary | ICD-10-CM

## 2017-01-23 NOTE — Progress Notes (Signed)
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  INTERVAL HISTORY: Patient returns to clinic today for further evaluation. She completed XRT to her right upper lobe of lung on September 08, 2016, and tolerated her treatments well. She also continues to tolerate letrozole well without significant side effects.  She currently feels well and is asymptomatic. She denies any recent fevers or illnesses. He denies any chest pain, shortness of breath, cough, or hemoptysis.  She reports occasional constipation, resolved with OTC stool softener.  She denies any nausea, vomiting, or diarrhea. She has no urinary complaints. Patient offers no further specific complaints today.    REVIEW OF SYSTEMS:   Review of Systems  Constitutional: Negative.  Negative for fever, malaise/fatigue and weight loss.  HENT: Negative for congestion.   Respiratory: Negative for cough and shortness of breath.   Cardiovascular: Negative.  Negative for chest pain and leg swelling.  Gastrointestinal: Positive for constipation. Negative for abdominal pain, blood in stool, diarrhea, nausea and vomiting.  Genitourinary: Negative.   Musculoskeletal: Negative.   Skin: Negative.   Neurological: Positive for sensory change. Negative for tingling, weakness and headaches.  Psychiatric/Behavioral: Negative.  The patient is not nervous/anxious and does not have insomnia.     As per HPI. Otherwise, a complete review of systems is negative.  PAST MEDICAL HISTORY: Past Medical History:  Diagnosis Date  . Breast cancer ([place]) 08/19/2012   LT LUMPECTOMY, radiation  . COPD (chronic  obstructive pulmonary disease) ([place])   . Hailey-hailey disease   . Hyperlipemia   . Hypertension   . Lung cancer ([place]) 2014   RT LUNG, radiation and chemo  . Migraine   . Myocardial infarction   . Osteoporosis   . Radiation 2013   FOR BREAST CA  . Radiation 2014   FOR LUNG CA  . Renal artery stenosis ([place])     PAST SURGICAL HISTORY: Past Surgical History:  Procedure Laterality Date  . ABDOMINAL HYSTERECTOMY    . APPENDECTOMY    . BREAST BIOPSY Left 08/03/2012   positive, u/s guided bx  . BREAST LUMPECTOMY Left   . cardiac stents    . PARATHYROIDECTOMY    . TONSILLECTOMY      FAMILY HISTORY Family History  Problem Relation Age of Onset  . Breast cancer Sister 36       ADVANCED DIRECTIVES:    HEALTH MAINTENANCE: Social History  Substance Use Topics  . Smoking status: Current Every Day Smoker    Packs/day: 1.00    Years: 70.00  . Smokeless tobacco: Not on file  . Alcohol use No      Allergies  Allergen Reactions  . Bactrim [Sulfamethoxazole-Trimethoprim] Other (See Comments)    Reduced kidney function.  Per Dr. Holley Raring should not take  . Macrobid [employer] Macro] Other (See Comments)    Reduced Kidney function.  Should no longer take per Dr. Holley Raring  . Tape Rash    Current Outpatient Prescriptions  Medication Sig Dispense Refill  . albuterol (PROAIR HFA) 108 (90 Base) MCG/ACT inhaler     . amLODipine (NORVASC) 2.5 MG tablet Take 2.5 mg by mouth daily.    Marland Kitchen aspirin 325 MG tablet  Take 325 mg by mouth daily.    Marland Kitchen azelastine (ASTELIN) 0.1 % nasal spray Place into the nose.    . Calcium Carbonate-Vitamin D (CALCIUM-VITAMIN D) 500-200 MG-UNIT per tablet Take 1 tablet by mouth daily.    . fluticasone (FLONASE) 50 MCG/ACT nasal spray Place 2 sprays into both nostrils daily.    Marland Kitchen ketotifen (ZADITOR) 0.025 % ophthalmic solution 1 drop 2 (two) times daily.    Marland Kitchen letrozole (FEMARA) 2.5 MG tablet Take 1 tablet (2.5 mg total) by mouth daily. 90 tablet 2    . lisinopril (PRINIVIL,ZESTRIL) 20 MG tablet Take 20 mg by mouth daily.    . metoprolol succinate (TOPROL-XL) 50 MG 24 hr tablet Take 25 mg by mouth daily. Take with or immediately following a meal.    . Multiple Vitamin (MULTIVITAMIN) tablet Take 1 tablet by mouth daily.    . simvastatin (ZOCOR) 40 MG tablet Take 40 mg by mouth daily.    . traMADol (ULTRAM) 50 MG tablet Take by mouth every 6 (six) hours as needed.     No current facility-administered medications for this visit.     OBJECTIVE: Vitals:   01/23/17 1046  BP: 133/75  Pulse: 67  Resp: 18  Temp: 97.1 F (36.2 C)     Body mass index is 19.75 kg/m.    ECOG FS:0 - Asymptomatic  General: Well-developed, well-nourished, no acute distress. Eyes: Pink conjunctiva, anicteric sclera. Breasts: Bilateral breast and axilla without lumps or masses. Exam deferred today. Lungs: Clear to auscultation bilaterally. Heart: Regular rate and rhythm. No rubs, murmurs, or gallops. Abdomen: Soft, nontender, nondistended. No organomegaly noted, normoactive bowel sounds. Musculoskeletal: No edema, cyanosis, or clubbing. Neuro: Alert, answering all questions appropriately. Cranial nerves grossly intact. Skin: No rashes  Psych: Normal affect.   LAB RESULTS:  Lab Results  Component Value Date   NA 139 07/22/2016   K 5.4 (H) 07/22/2016   CL 105 07/22/2016   CO2 27 07/22/2016   GLUCOSE 102 (H) 07/22/2016   BUN 24 (H) 07/22/2016   CREATININE 1.32 (H) 12/02/2016   CALCIUM 8.9 07/22/2016   PROT 7.7 01/23/2016   ALBUMIN 4.2 01/23/2016   AST 19 01/23/2016   ALT 15 01/23/2016   ALKPHOS 82 01/23/2016   BILITOT 0.5 01/23/2016   GFRNONAA 35 (L) 12/02/2016   GFRAA 41 (L) 12/02/2016    Lab Results  Component Value Date   WBC 8.5 01/23/2016   NEUTROABS 6.1 01/23/2016   HGB 14.3 01/23/2016   HCT 43.7 01/23/2016   MCV 94.0 01/23/2016   PLT 249 01/23/2016     STUDIES: No results found.  ASSESSMENT: Stage Ia ER positive, PR/HER-2  negative adenocarcinoma of the inner lower quadrant of the left breast, stage Ia adenocarcinoma of the right upper lobe lung, now with clinical stage Ia right lower lobe lesion.  PLAN:   1. Right lower lobe lung mass: CT scan results reviewed independently with improvement of the lesion after XRT. No further intervention is needed at this time. Given patient's advanced age, would not recommend chemotherapy. Return to clinic in July 2018 with repeat CT scan and further evaluation.  2. 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  Oncotype testing was not performed as this would not change the treatment plan. Continue letrozole completing in August 2019. Her most recent mammogram on July 17, 2016 was reported  as BI-RADS 2.  Repeat in September 2018. 3. Osteoporosis: Patient's most recent bone mineral density on July 22, 2016 and was reported as -2. 9, which is essentially unchanged from one year prior.  Continue Prolia, calcium and vitamin D. Repeat scan in September 2018. 4.  Stage Ia adenocarcinoma of the right upper lobe lung: Patient previously elected to proceed with XRT only. No intervention is needed at this time.  Patient is now nearly 2 years removed from completing her XRT. CT scan as above.    Patient expressed understanding and was in agreement with this plan. She also understands that She can call clinic at any time with any questions, concerns, or complaints.    Lloyd Huger, MD 01/23/17 11:07 AM

## 2017-01-23 NOTE — Progress Notes (Signed)
Patient is here for follow up no complaints today.

## 2017-02-11 ENCOUNTER — Telehealth: Payer: Self-pay | Admitting: *Deleted

## 2017-02-11 NOTE — Telephone Encounter (Signed)
Called asking to be seen today for a new lump patient found in her surgical breast. Please advise

## 2017-02-11 NOTE — Telephone Encounter (Signed)
[name] [name] per VO Dr Grayland Ormond that Dr Baruch Gouty can evaluate her when she sees him next week

## 2017-02-12 ENCOUNTER — Inpatient Hospital Stay: Payer: Medicare Other

## 2017-02-12 DIAGNOSIS — C3411 Malignant neoplasm of upper lobe, right bronchus or lung: Secondary | ICD-10-CM

## 2017-02-12 DIAGNOSIS — C50312 Malignant neoplasm of lower-inner quadrant of left female breast: Secondary | ICD-10-CM | POA: Diagnosis not present

## 2017-02-12 DIAGNOSIS — M810 Age-related osteoporosis without current pathological fracture: Secondary | ICD-10-CM

## 2017-02-12 LAB — BASIC METABOLIC PANEL
ANION GAP: 7 (ref 5–15)
BUN: 27 mg/dL — ABNORMAL HIGH (ref 6–20)
CHLORIDE: 104 mmol/L (ref 101–111)
CO2: 27 mmol/L (ref 22–32)
Calcium: 9.5 mg/dL (ref 8.9–10.3)
Creatinine, Ser: 1.2 mg/dL — ABNORMAL HIGH (ref 0.44–1.00)
GFR calc Af Amer: 46 mL/min — ABNORMAL LOW (ref >60)
GFR calc non Af Amer: 39 mL/min — ABNORMAL LOW (ref >60)
GLUCOSE: 97 mg/dL (ref 65–99)
POTASSIUM: 4.3 mmol/L (ref 3.5–5.1)
Sodium: 138 mmol/L (ref 135–145)

## 2017-02-12 MED ORDER — DENOSUMAB 60 MG/ML ~~LOC~~ SOLN
60.0000 mg | Freq: Once | SUBCUTANEOUS | Status: AC
  Administered 2017-02-12: 60 mg via SUBCUTANEOUS

## 2017-02-12 MED ORDER — SODIUM CHLORIDE 0.9 % IV SOLN
Freq: Once | INTRAVENOUS | Status: DC
  Filled 2017-02-12: qty 1000

## 2017-02-12 NOTE — Patient Instructions (Signed)

## 2017-02-18 ENCOUNTER — Ambulatory Visit
Admission: RE | Admit: 2017-02-18 | Discharge: 2017-02-18 | Disposition: A | Discharge status: Home / Self Care | Payer: Medicare Other | Source: Ambulatory Visit | Attending: Radiation Oncology | Admitting: Radiation Oncology

## 2017-02-18 ENCOUNTER — Encounter: Payer: Self-pay | Admitting: Radiation Oncology

## 2017-02-18 VITALS — BP 155/87 | HR 81 | Temp 97.0°F | Wt 114.5 lb

## 2017-02-18 DIAGNOSIS — Z923 Personal history of irradiation: Secondary | ICD-10-CM | POA: Insufficient documentation

## 2017-02-18 DIAGNOSIS — Z853 Personal history of malignant neoplasm of breast: Secondary | ICD-10-CM | POA: Diagnosis present

## 2017-02-18 DIAGNOSIS — Z85118 Personal history of other malignant neoplasm of bronchus and lung: Secondary | ICD-10-CM | POA: Insufficient documentation

## 2017-02-18 DIAGNOSIS — C3491 Malignant neoplasm of unspecified part of right bronchus or lung: Secondary | ICD-10-CM

## 2017-02-18 NOTE — Progress Notes (Signed)
Radiation Oncology Follow up Note  Name: [name] [name] [facility]   Date:   02/18/2017 MRN:  [id number] DOB: December 28, 1927    This 83 y.o. female presents to the clinic today for follow-up for SB RT to her right lower lobe as well as breast cancer.  REFERRING PROVIDER: Ezequiel Kayser, MD  HPI: Patient is a 83 year old female now out close to 6 months having completed SB RT to her right lower lobe for non-small cell lung cancer. She is also status post radiation therapy. Back in 2014 to a right upper lobe adenocarcinoma. Patient is also had whole breast radiation to her left breast back in early 2014 for ER/PR positive invasive mammary carcinoma. Seen today in routine follow-up she is doing well. She had a repeat CT scan back in January showing good resolution of the right lower lobe mass. She scheduled for follow-up CT scan in July. She is felt something in her left breast. Patient does not recall her last mammograms I'm checking on those dates. She specifically denies cough hemoptysis or chest tightness or any change in pulmonary status.  COMPLICATIONS OF TREATMENT: none  FOLLOW UP COMPLIANCE: keeps appointments   PHYSICAL EXAM:  BP (!) 155/87   Pulse 81   Temp 97 F (36.1 C)   Wt 114 lb 8.5 oz (51.9 kg)   BMI 19.66 kg/m  Lungs are clear to A&P cardiac examination essentially unremarkable with regular rate and rhythm. No dominant mass or nodularity is noted in either breast in 2 positions examined. Incision is well-healed. No axillary or supraclavicular adenopathy is appreciated. Cosmetic result is excellent. In area of concern I detect no evidence of mass or nodularity. Well-developed well-nourished patient in NAD. HEENT reveals PERLA, EOMI, discs not visualized.  Oral cavity is clear. No oral mucosal lesions are identified. Neck is clear without evidence of cervical or supraclavicular adenopathy. Lungs are clear to A&P. Cardiac examination is essentially unremarkable with regular rate and rhythm  without murmur rub or thrill. Abdomen is benign with no organomegaly or masses noted. Motor sensory and DTR levels are equal and symmetric in the upper and lower extremities. Cranial nerves II through XII are grossly intact. Proprioception is intact. No peripheral adenopathy or edema is identified. No motor or sensory levels are noted. Crude visual fields are within normal range.  RADIOLOGY RESULTS: CT scan from general reviewed compatible above-stated findings. Will review her July CT scan. Also am requesting mammograms more recently if not will order them.  PLAN: Present time patient is doing well. I'm please were overall progress. Will look up her mammogram history and order mammograms if necessary. Otherwise will follow-up with CT scan in July. I've asked to see her back in 6 months for follow-up. Patient knows to call sooner with any concerns. She continues close follow-up care with medical oncology.  I would like to take this opportunity to thank you for allowing me to participate in the care of your patient.Armstead Peaks., MD

## 2017-03-03 ENCOUNTER — Encounter: Payer: Self-pay | Admitting: *Deleted

## 2017-03-03 ENCOUNTER — Ambulatory Visit
Admission: EM | Admit: 2017-03-03 | Discharge: 2017-03-03 | Disposition: A | Discharge status: Home / Self Care | Payer: Medicare Other | Attending: Family Medicine | Admitting: Family Medicine

## 2017-03-03 DIAGNOSIS — H6982 Other specified disorders of Eustachian tube, left ear: Secondary | ICD-10-CM | POA: Diagnosis not present

## 2017-03-03 MED ORDER — AMOXICILLIN-POT CLAVULANATE 875-125 MG PO TABS: 1.0000 | ORAL_TABLET | Freq: Two times a day (BID) | ORAL | 0 refills | Status: DC

## 2017-03-03 NOTE — ED Provider Notes (Signed)
CSN: [id number]     Arrival date & time 03/03/17  1339 History   First MD Initiated Contact with Patient 03/03/17 1559     Chief Complaint  Patient presents with  . Otalgia   (Consider location/radiation/quality/duration/timing/severity/associated sxs/prior Treatment) HPI  This is an 83 year old female who presents with left ear pain that started 2 days ago. Said her pain began behind her ear first radiated up to his top of her head and now seems to be below the area and the jawline. Hurts worse when she swallows but she doesn't have any pop or click in her ear. She's had no tinnitus and she's had no discharge from her ear. She denies any fever or chills.        Past Medical History:  Diagnosis Date  . Breast cancer ([place]) 08/19/2012   LT LUMPECTOMY, radiation  . COPD (chronic obstructive pulmonary disease) ([place])   . Hailey-hailey disease   . Hyperlipemia   . Hypertension   . Lung cancer ([place]) 2014   RT LUNG, radiation and chemo  . Migraine   . Myocardial infarction ([place])   . Osteoporosis   . Radiation 2013   FOR BREAST CA  . Radiation 2014   FOR LUNG CA  . Renal artery stenosis [facility])    Past Surgical History:  Procedure Laterality Date  . ABDOMINAL HYSTERECTOMY    . APPENDECTOMY    . BREAST BIOPSY Left 08/03/2012   positive, u/s guided bx  . BREAST LUMPECTOMY Left   . cardiac stents    . PARATHYROIDECTOMY    . TONSILLECTOMY     Family History  Problem Relation Age of Onset  . Breast cancer Sister 37   Social History  Substance Use Topics  . Smoking status: Current Every Day Smoker    Packs/day: 1.00    Years: 70.00  . Smokeless tobacco: Never Used  . Alcohol use No   OB History    No data available     Review of Systems  Constitutional: Negative for activity change, chills, fatigue and fever.  HENT: Positive for ear pain. Negative for ear discharge.   Respiratory: Negative for cough, shortness of breath, wheezing and stridor.   Neurological: Negative  for dizziness.  All other systems reviewed and are negative.   Allergies  Bactrim [sulfamethoxazole-trimethoprim]; Macrobid [nitrofurantoin monohyd macro]; and Tape  Home Medications   Prior to Admission medications   Medication Sig Start Date End Date Taking? Authorizing Provider  albuterol (PROAIR HFA) 108 (90 Base) MCG/ACT inhaler  08/15/15  Yes Historical Provider, MD  amLODipine (NORVASC) 2.5 MG tablet Take 2.5 mg by mouth daily.   Yes Historical Provider, MD  aspirin 325 MG tablet Take 325 mg by mouth daily.   Yes Historical Provider, MD  Calcium Carbonate-Vitamin D (CALCIUM-VITAMIN D) 500-200 MG-UNIT per tablet Take 1 tablet by mouth daily.   Yes Historical Provider, MD  fluticasone (FLONASE) 50 MCG/ACT nasal spray Place 2 sprays into both nostrils daily.   Yes Historical Provider, MD  ketotifen (ZADITOR) 0.025 % ophthalmic solution 1 drop 2 (two) times daily.   Yes Historical Provider, MD  letrozole (FEMARA) 2.5 MG tablet Take 1 tablet (2.5 mg total) by mouth daily. 01/29/16  Yes Lloyd Huger, MD  lisinopril (PRINIVIL,ZESTRIL) 20 MG tablet Take 20 mg by mouth daily.   Yes Historical Provider, MD  metoprolol succinate (TOPROL-XL) 50 MG 24 hr tablet Take 25 mg by mouth daily. Take with or immediately following a meal.   Yes  Historical Provider, MD  Multiple Vitamin (MULTIVITAMIN) tablet Take 1 tablet by mouth daily.   Yes Historical Provider, MD  simvastatin (ZOCOR) 40 MG tablet Take 40 mg by mouth daily.   Yes Historical Provider, MD  amoxicillin-clavulanate (AUGMENTIN) 875-125 MG tablet Take 1 tablet by mouth every 12 (twelve) hours. 03/03/17   Lorin Picket, PA-C  traMADol (ULTRAM) 50 MG tablet Take by mouth every 6 (six) hours as needed.    Historical Provider, MD   Meds Ordered and Administered this Visit  Medications - No data to display  BP 126/69 (BP Location: Left Arm)   Pulse 76   Temp 98.3 F (36.8 C) (Oral)   Resp 16   Ht '5\' 3"'$  (1.6 m)   SpO2 96%  No data  found.   Physical Exam  Urgent Care Course     Procedures (including critical care time)  Labs Review Labs Reviewed - No data to display  Imaging Review No results found.   Visual Acuity Review  Right Eye Distance:   Left Eye Distance:   Bilateral Distance:    Right Eye Near:   Left Eye Near:    Bilateral Near:         MDM   1. Eustachian tube dysfunction, left    Discharge Medication List as of 03/03/2017  4:14 PM    START taking these medications   Details  amoxicillin-clavulanate (AUGMENTIN) 875-125 MG tablet Take 1 tablet by mouth every 12 (twelve) hours., Starting Tue 03/03/2017, Normal      Plan: 1. Test/x-ray results and diagnosis reviewed with patient 2. rx as per orders; risks, benefits, potential side effects reviewed with patient 3. Recommend supportive treatment with Continue use of Flonase. She uses Flonase chronically so I will add an antibiotic to Augmentin o her regimine. If she is not improving she should follow-up with a ear nose  and throat physician. 4. F/u prn if symptoms worsen or don't improve     Lorin Picket, PA-C 03/03/17 1622

## 2017-03-03 NOTE — ED Triage Notes (Signed)
Patient started having symptom of left ear pain 2 days ago. Upon inspection the left ear canal is clear of wax and the ear drum visible.

## 2017-05-07 ENCOUNTER — Ambulatory Visit: Payer: Medicare Other | Admitting: Radiation Oncology

## 2017-05-21 ENCOUNTER — Encounter: Payer: Self-pay | Admitting: Radiation Oncology

## 2017-05-21 ENCOUNTER — Ambulatory Visit
Admission: RE | Admit: 2017-05-21 | Discharge: 2017-05-21 | Disposition: A | Discharge status: Home / Self Care | Payer: Medicare Other | Source: Ambulatory Visit | Attending: Radiation Oncology | Admitting: Radiation Oncology

## 2017-05-21 VITALS — BP 134/76 | HR 85 | Temp 97.8°F | Wt 116.5 lb

## 2017-05-21 DIAGNOSIS — F17210 Nicotine dependence, cigarettes, uncomplicated: Secondary | ICD-10-CM | POA: Diagnosis not present

## 2017-05-21 DIAGNOSIS — Z923 Personal history of irradiation: Secondary | ICD-10-CM | POA: Insufficient documentation

## 2017-05-21 DIAGNOSIS — C3432 Malignant neoplasm of lower lobe, left bronchus or lung: Secondary | ICD-10-CM | POA: Diagnosis present

## 2017-05-21 DIAGNOSIS — Z853 Personal history of malignant neoplasm of breast: Secondary | ICD-10-CM | POA: Insufficient documentation

## 2017-05-21 DIAGNOSIS — C3491 Malignant neoplasm of unspecified part of right bronchus or lung: Secondary | ICD-10-CM

## 2017-05-21 NOTE — Progress Notes (Signed)
Radiation Oncology Follow up Note  Name: Demetri Kerman [facility]   Date:   05/21/2017 MRN:  [id number] DOB: 08-01-1928    This 83 y.o. female presents to the clinic today for follow-up of both breast cancer as well as SB RT to her right lower lobe.  REFERRING PROVIDER: Ezequiel Kayser, MD  HPI: Patient is a. 83 year old female now out close to 7 months having completed SB RT to right lower lobe for non-small cell lung cancer. She is also status post radiation therapy back in 2014 to a right upper lobe adenocarcinoma. She also had a whole breast radiation 2014 for ER/PR positive invasive mammary carcinoma seen today in routine follow-up she states she feels lousy. Overall no significant complaints she specifically denies cough hemoptysis chest tightness or any other masses or nodularity in the breast. She is scheduled for repeat CT scan of the chest in about 2 weeks. Her last CT scan back in January 2018 showed decreased size in the right lower lobe nodule with no other significant disease in the chest.  COMPLICATIONS OF TREATMENT: none  FOLLOW UP COMPLIANCE: keeps appointments   PHYSICAL EXAM:  BP 134/76   Pulse 85   Temp 97.8 F (36.6 C)   Wt 116 lb 8.2 oz (52.9 kg)   BMI 20.64 kg/m  Elderly female rather frail in NAD.Lungs are clear to A&P cardiac examination essentially unremarkable with regular rate and rhythm. No dominant mass or nodularity is noted in either breast in 2 positions examined. Incision is well-healed. No axillary or supraclavicular adenopathy is appreciated. Cosmetic result is excellent. Well-developed well-nourished patient in NAD. HEENT reveals PERLA, EOMI, discs not visualized.  Oral cavity is clear. No oral mucosal lesions are identified. Neck is clear without evidence of cervical or supraclavicular adenopathy. Lungs are clear to A&P. Cardiac examination is essentially unremarkable with regular rate and rhythm without murmur rub or thrill. Abdomen is benign with no  organomegaly or masses noted. Motor sensory and DTR levels are equal and symmetric in the upper and lower extremities. Cranial nerves II through XII are grossly intact. Proprioception is intact. No peripheral adenopathy or edema is identified. No motor or sensory levels are noted. Crude visual fields are within normal range.  RADIOLOGY RESULTS: Previous CT scan reviewed will review her CT scan which will be performed the next 2 weeks  PLAN: Present time patient is stable. Mammograms will be ordered. I will review her CT scan when it becomes available. Otherwise I'm please were overall progress. I'll start seeing her once your basis. Patient knows to call sooner with any concerns. Daughter was present during our appointment.  I would like to take this opportunity to thank you for allowing me to participate in the care of your patient.Armstead Peaks., MD

## 2017-06-03 ENCOUNTER — Other Ambulatory Visit: Payer: Self-pay | Admitting: Oncology

## 2017-06-03 ENCOUNTER — Other Ambulatory Visit: Payer: Self-pay

## 2017-06-03 DIAGNOSIS — C3490 Malignant neoplasm of unspecified part of unspecified bronchus or lung: Secondary | ICD-10-CM

## 2017-06-03 DIAGNOSIS — C3411 Malignant neoplasm of upper lobe, right bronchus or lung: Secondary | ICD-10-CM

## 2017-06-04 ENCOUNTER — Ambulatory Visit: Admission: RE | Admit: 2017-06-04 | Payer: Medicare Other | Source: Ambulatory Visit

## 2017-06-04 ENCOUNTER — Inpatient Hospital Stay: Payer: Medicare Other | Attending: Oncology

## 2017-06-04 ENCOUNTER — Ambulatory Visit
Admission: RE | Admit: 2017-06-04 | Discharge: 2017-06-04 | Disposition: A | Discharge status: Home / Self Care | Payer: Medicare Other | Source: Ambulatory Visit | Attending: Oncology | Admitting: Oncology

## 2017-06-04 ENCOUNTER — Other Ambulatory Visit: Admission: RE | Admit: 2017-06-04 | Payer: Medicare Other | Source: Ambulatory Visit | Admitting: Oncology

## 2017-06-04 DIAGNOSIS — C50312 Malignant neoplasm of lower-inner quadrant of left female breast: Secondary | ICD-10-CM | POA: Insufficient documentation

## 2017-06-04 DIAGNOSIS — I252 Old myocardial infarction: Secondary | ICD-10-CM | POA: Diagnosis not present

## 2017-06-04 DIAGNOSIS — Z882 Allergy status to sulfonamides status: Secondary | ICD-10-CM | POA: Insufficient documentation

## 2017-06-04 DIAGNOSIS — Z7982 Long term (current) use of aspirin: Secondary | ICD-10-CM | POA: Diagnosis not present

## 2017-06-04 DIAGNOSIS — Z923 Personal history of irradiation: Secondary | ICD-10-CM | POA: Diagnosis not present

## 2017-06-04 DIAGNOSIS — Z9221 Personal history of antineoplastic chemotherapy: Secondary | ICD-10-CM | POA: Insufficient documentation

## 2017-06-04 DIAGNOSIS — Z79899 Other long term (current) drug therapy: Secondary | ICD-10-CM | POA: Insufficient documentation

## 2017-06-04 DIAGNOSIS — R590 Localized enlarged lymph nodes: Secondary | ICD-10-CM | POA: Insufficient documentation

## 2017-06-04 DIAGNOSIS — Z79811 Long term (current) use of aromatase inhibitors: Secondary | ICD-10-CM | POA: Insufficient documentation

## 2017-06-04 DIAGNOSIS — J449 Chronic obstructive pulmonary disease, unspecified: Secondary | ICD-10-CM | POA: Diagnosis not present

## 2017-06-04 DIAGNOSIS — I712 Thoracic aortic aneurysm, without rupture: Secondary | ICD-10-CM | POA: Diagnosis not present

## 2017-06-04 DIAGNOSIS — F17210 Nicotine dependence, cigarettes, uncomplicated: Secondary | ICD-10-CM | POA: Diagnosis not present

## 2017-06-04 DIAGNOSIS — C3490 Malignant neoplasm of unspecified part of unspecified bronchus or lung: Secondary | ICD-10-CM | POA: Diagnosis not present

## 2017-06-04 DIAGNOSIS — C3411 Malignant neoplasm of upper lobe, right bronchus or lung: Secondary | ICD-10-CM

## 2017-06-04 DIAGNOSIS — I10 Essential (primary) hypertension: Secondary | ICD-10-CM | POA: Insufficient documentation

## 2017-06-04 DIAGNOSIS — Z803 Family history of malignant neoplasm of breast: Secondary | ICD-10-CM | POA: Diagnosis not present

## 2017-06-04 DIAGNOSIS — M810 Age-related osteoporosis without current pathological fracture: Secondary | ICD-10-CM | POA: Insufficient documentation

## 2017-06-04 DIAGNOSIS — I700 Atherosclerosis of aorta: Secondary | ICD-10-CM | POA: Diagnosis not present

## 2017-06-04 DIAGNOSIS — I2510 Atherosclerotic heart disease of native coronary artery without angina pectoris: Secondary | ICD-10-CM | POA: Insufficient documentation

## 2017-06-04 DIAGNOSIS — J439 Emphysema, unspecified: Secondary | ICD-10-CM | POA: Insufficient documentation

## 2017-06-04 DIAGNOSIS — Z170 Estrogen receptor positive status [ER+]: Secondary | ICD-10-CM | POA: Insufficient documentation

## 2017-06-04 DIAGNOSIS — E785 Hyperlipidemia, unspecified: Secondary | ICD-10-CM | POA: Diagnosis not present

## 2017-06-04 DIAGNOSIS — C3431 Malignant neoplasm of lower lobe, right bronchus or lung: Secondary | ICD-10-CM | POA: Diagnosis present

## 2017-06-04 LAB — CREATININE, SERUM
Creatinine, Ser: 1.31 mg/dL — ABNORMAL HIGH (ref 0.44–1.00)
GFR, EST AFRICAN AMERICAN: 41 mL/min — AB (ref >60)
GFR, EST NON AFRICAN AMERICAN: 35 mL/min — AB (ref >60)

## 2017-06-04 MED ORDER — IOPAMIDOL (ISOVUE-300) INJECTION 61%
75.0000 mL | Freq: Once | INTRAVENOUS | Status: AC | PRN
  Administered 2017-06-04: 60 mL via INTRAVENOUS

## 2017-06-08 ENCOUNTER — Ambulatory Visit: Payer: Medicare Other | Admitting: Radiation Oncology

## 2017-06-11 NOTE — Progress Notes (Signed)
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  INTERVAL HISTORY: Patient returns to clinic today for further evaluation and discussion of her imaging results. She continues to feel well and is asymptomatic. She also continues to tolerate letrozole without significant side effects.  She denies any recent fevers or illnesses. He denies any chest pain, shortness of breath, cough, or hemoptysis. She denies any nausea, vomiting, constipation, or diarrhea. She has no urinary complaints. Patient offers no specific complaints today.    REVIEW OF SYSTEMS:   Review of Systems  Constitutional: Negative.  Negative for fever, malaise/fatigue and weight loss.  Respiratory: Negative for cough and shortness of breath.   Cardiovascular: Negative.  Negative for chest pain and leg swelling.  Gastrointestinal: Negative for abdominal pain, blood in stool, constipation, diarrhea, nausea and vomiting.  Genitourinary: Negative.   Musculoskeletal: Negative.   Skin: Negative.  Negative for rash.  Neurological: Negative for tingling, sensory change, weakness and headaches.  Psychiatric/Behavioral: Negative.  The patient is not nervous/anxious and does not have insomnia.     As per HPI. Otherwise, a complete review of systems is negative.  PAST MEDICAL HISTORY: Past Medical History:  Diagnosis Date  . Breast cancer ([place]) 08/19/2012   LT LUMPECTOMY, radiation  . COPD (chronic obstructive pulmonary disease) ([place])   . Hailey-hailey disease   . Hyperlipemia   . Hypertension   . Lung cancer ([place]) 2014   RT LUNG, radiation and chemo  . Migraine   .  Myocardial infarction ([place])   . Osteoporosis   . Radiation 2013   FOR BREAST CA  . Radiation 2014   FOR LUNG CA  . Renal artery stenosis ([place])     PAST SURGICAL HISTORY: Past Surgical History:  Procedure Laterality Date  . ABDOMINAL HYSTERECTOMY    . APPENDECTOMY    . BREAST BIOPSY Left 08/03/2012   positive, u/s guided bx  . BREAST LUMPECTOMY Left   . cardiac stents    . PARATHYROIDECTOMY    . TONSILLECTOMY      FAMILY HISTORY Family History  Problem Relation Age of Onset  . Breast cancer Sister 52       ADVANCED DIRECTIVES:    HEALTH MAINTENANCE: Social History  Substance Use Topics  . Smoking status: Current Every Day Smoker    Packs/day: 1.00    Years: 70.00  . Smokeless tobacco: Never Used  . Alcohol use No      Allergies  Allergen Reactions  . Bactrim [Sulfamethoxazole-Trimethoprim] Other (See Comments)    Reduced kidney function.  Per Dr. Holley Raring should not take  . Macrobid [employer] Macro] Other (See Comments)    Reduced Kidney function.  Should no longer take per Dr. Holley Raring  . Tape Rash    Current Outpatient Prescriptions  Medication Sig Dispense Refill  . albuterol (PROAIR HFA) 108 (90 Base) MCG/ACT inhaler     . amLODipine (NORVASC) 2.5 MG tablet Take 2.5 mg by mouth daily.    Marland Kitchen aspirin 325 MG tablet Take 325 mg by mouth daily.    . Calcium Carbonate-Vitamin D (CALCIUM-VITAMIN D) 500-200 MG-UNIT per tablet Take 1 tablet by mouth daily.    . fluticasone (FLONASE)  50 MCG/ACT nasal spray Place 2 sprays into both nostrils daily.    Marland Kitchen ketotifen (ZADITOR) 0.025 % ophthalmic solution 1 drop 2 (two) times daily.    Marland Kitchen letrozole (FEMARA) 2.5 MG tablet Take 1 tablet (2.5 mg total) by mouth daily. 90 tablet 2  . lisinopril (PRINIVIL,ZESTRIL) 20 MG tablet Take 20 mg by mouth daily.    . metoprolol succinate (TOPROL-XL) 50 MG 24 hr tablet Take 25 mg by mouth daily. Take with or immediately following a meal.    . Multiple Vitamin (MULTIVITAMIN)  tablet Take 1 tablet by mouth daily.    . simvastatin (ZOCOR) 40 MG tablet Take 40 mg by mouth daily.    . traMADol (ULTRAM) 50 MG tablet Take by mouth every 6 (six) hours as needed.    Marland Kitchen amoxicillin-clavulanate (AUGMENTIN) 875-125 MG tablet Take 1 tablet by mouth every 12 (twelve) hours. (Patient not taking: Reported on 06/12/2017) 20 tablet 0   No current facility-administered medications for this visit.     OBJECTIVE: Vitals:   06/12/17 1005  BP: (!) 146/77  Temp: 97.7 F (36.5 C)     Body mass index is 20.99 kg/m.    ECOG FS:0 - Asymptomatic  General: Well-developed, well-nourished, no acute distress. Eyes: Pink conjunctiva, anicteric sclera. Breasts: Bilateral breast and axilla without lumps or masses. Exam deferred today. Lungs: Clear to auscultation bilaterally. Heart: Regular rate and rhythm. No rubs, murmurs, or gallops. Abdomen: Soft, nontender, nondistended. No organomegaly noted, normoactive bowel sounds. Musculoskeletal: No edema, cyanosis, or clubbing. Neuro: Alert, answering all questions appropriately. Cranial nerves grossly intact. Skin: No rashes  Psych: Normal affect.   LAB RESULTS:  Lab Results  Component Value Date   NA 138 02/12/2017   K 4.3 02/12/2017   CL 104 02/12/2017   CO2 27 02/12/2017   GLUCOSE 97 02/12/2017   BUN 27 (H) 02/12/2017   CREATININE 1.31 (H) 06/04/2017   CALCIUM 9.5 02/12/2017   PROT 7.7 01/23/2016   ALBUMIN 4.2 01/23/2016   AST 19 01/23/2016   ALT 15 01/23/2016   ALKPHOS 82 01/23/2016   BILITOT 0.5 01/23/2016   GFRNONAA 35 (L) 06/04/2017   GFRAA 41 (L) 06/04/2017    Lab Results  Component Value Date   WBC 8.5 01/23/2016   NEUTROABS 6.1 01/23/2016   HGB 14.3 01/23/2016   HCT 43.7 01/23/2016   MCV 94.0 01/23/2016   PLT 249 01/23/2016     STUDIES: Ct Chest W Contrast  Result Date: 06/04/2017 CLINICAL DATA:  Restaging lung cancer. EXAM: CT CHEST WITH CONTRAST TECHNIQUE: Multidetector CT imaging of the chest was  performed during intravenous contrast administration. CONTRAST:  69m ISOVUE-300 IOPAMIDOL (ISOVUE-300) INJECTION 61% COMPARISON:  CT scan 12/02/2016 FINDINGS: Cardiovascular: The heart is normal in size and stable. No pericardial effusion. Severe atherosclerotic disease involving the thoracic aorta with extensive calcifications mural thrombus and soft plaque. Stable 3.6 cm descending thoracic aortic aneurysm on image number 103. No dissection. Stable coronary artery calcifications. Mediastinum/Nodes: There is an enlarging right-sided subcarinal lymph node. This measures 21.5 x 12.5 mm on image number 83. It previously measured 15 x 8.5 mm. This was weakly positive on the prior PET-CT from 07/31/2016. No new mediastinal or hilar lymph nodes. The esophagus is grossly normal. Lungs/Pleura: Stable right upper lobe scarring changes, likely radiation. No new pulmonary lesions or acute pulmonary findings. Slight increase and right basilar scarring changes just above the hemidiaphragm. The right lower lobe pulmonary nodule seen on the prior chest CT are no longer  visualized. No pleural effusion. Upper Abdomen: Stable large complex left adrenal gland lesion with calcification. No hepatic lesions. Stable cystic lesion in the pancreatic head. Stable severe atherosclerotic calcifications involving the upper abdominal aorta. Chest wall/ Musculoskeletal: No breast masses, supraclavicular or axillary lymphadenopathy. No acute bony findings. No evidence of osseous metastatic disease. Diffuse osteoporosis. IMPRESSION: 1. Stable right upper lobe scarring changes/radiation. No findings for recurrent tumor. 2. Resolution of right lower lobe pulmonary nodules. 3. Enlarging subcarinal lymph node on the right side worrisome for progressive tumor. 4. Slight increase in right basilar scarring just above the right hemidiaphragm. 5. Advanced emphysematous changes but no acute pulmonary findings. 6. Severe atherosclerotic calcifications  involving the aorta with stable 3.6 cm descending thoracic aortic aneurysm. Aortic Atherosclerosis (ICD10-I70.0) and Emphysema (ICD10-J43.9). Electronically Signed   By: Marijo Sanes M.D.   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  PLAN:    1. Clinical stage Ia right lower lobe carcinoma: And completed XRT on September 08, 2016. CT scan results reviewed independently and reported as above with stable lung lesions, but enlarging subcarinal lymph node worrisome for progressive disease. No intervention is needed at this time, but will repeat imaging in 3 months to assess for interval change.   2. 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  Oncotype testing was not performed as this would not change the treatment plan. Continue letrozole completing in August 2019. Her most recent mammogram on July 17, 2016 was reported as BI-RADS 2.  Repeat in September 2018. 3. Osteoporosis: Patient's most recent bone mineral density on July 22, 2016 and was reported as -2. 9, which is essentially unchanged from one year prior.  Continue Prolia, calcium and vitamin D. Repeat scan in September 2018. 4.  Stage Ia adenocarcinoma of the right upper lobe lung: Patient previously elected to proceed with XRT only. No intervention is needed at this time.  Patient is now nearly 2 years removed from completing her XRT to this lesion. CT scan as above.    Patient expressed understanding and was in agreement with this plan. She also understands that She can call clinic at any time with any questions, concerns, or complaints.    Lloyd Huger, MD 06/13/17 8:06 AM

## 2017-06-12 ENCOUNTER — Inpatient Hospital Stay (HOSPITAL_BASED_OUTPATIENT_CLINIC_OR_DEPARTMENT_OTHER): Payer: Medicare Other | Admitting: Oncology

## 2017-06-12 VITALS — BP 146/77 | Temp 97.7°F | Wt 118.5 lb

## 2017-06-12 DIAGNOSIS — J449 Chronic obstructive pulmonary disease, unspecified: Secondary | ICD-10-CM | POA: Diagnosis not present

## 2017-06-12 DIAGNOSIS — I2510 Atherosclerotic heart disease of native coronary artery without angina pectoris: Secondary | ICD-10-CM

## 2017-06-12 DIAGNOSIS — C3431 Malignant neoplasm of lower lobe, right bronchus or lung: Secondary | ICD-10-CM | POA: Diagnosis not present

## 2017-06-12 DIAGNOSIS — Z7982 Long term (current) use of aspirin: Secondary | ICD-10-CM

## 2017-06-12 DIAGNOSIS — Z923 Personal history of irradiation: Secondary | ICD-10-CM | POA: Diagnosis not present

## 2017-06-12 DIAGNOSIS — I10 Essential (primary) hypertension: Secondary | ICD-10-CM | POA: Diagnosis not present

## 2017-06-12 DIAGNOSIS — Z79811 Long term (current) use of aromatase inhibitors: Secondary | ICD-10-CM | POA: Diagnosis not present

## 2017-06-12 DIAGNOSIS — Z9221 Personal history of antineoplastic chemotherapy: Secondary | ICD-10-CM

## 2017-06-12 DIAGNOSIS — R590 Localized enlarged lymph nodes: Secondary | ICD-10-CM | POA: Diagnosis not present

## 2017-06-12 DIAGNOSIS — M810 Age-related osteoporosis without current pathological fracture: Secondary | ICD-10-CM | POA: Diagnosis not present

## 2017-06-12 DIAGNOSIS — C50312 Malignant neoplasm of lower-inner quadrant of left female breast: Secondary | ICD-10-CM

## 2017-06-12 DIAGNOSIS — Z79899 Other long term (current) drug therapy: Secondary | ICD-10-CM

## 2017-06-12 DIAGNOSIS — Z170 Estrogen receptor positive status [ER+]: Secondary | ICD-10-CM | POA: Diagnosis not present

## 2017-06-12 DIAGNOSIS — Z803 Family history of malignant neoplasm of breast: Secondary | ICD-10-CM

## 2017-06-12 DIAGNOSIS — E785 Hyperlipidemia, unspecified: Secondary | ICD-10-CM

## 2017-06-12 DIAGNOSIS — Z882 Allergy status to sulfonamides status: Secondary | ICD-10-CM

## 2017-06-12 DIAGNOSIS — F17210 Nicotine dependence, cigarettes, uncomplicated: Secondary | ICD-10-CM

## 2017-06-12 DIAGNOSIS — I712 Thoracic aortic aneurysm, without rupture: Secondary | ICD-10-CM

## 2017-06-12 DIAGNOSIS — C3411 Malignant neoplasm of upper lobe, right bronchus or lung: Secondary | ICD-10-CM

## 2017-07-26 ENCOUNTER — Other Ambulatory Visit: Payer: Self-pay | Admitting: Oncology

## 2017-07-29 ENCOUNTER — Ambulatory Visit
Admission: RE | Admit: 2017-07-29 | Discharge: 2017-07-29 | Disposition: A | Discharge status: Home / Self Care | Payer: Medicare Other | Source: Ambulatory Visit | Attending: Oncology | Admitting: Oncology

## 2017-07-29 DIAGNOSIS — M810 Age-related osteoporosis without current pathological fracture: Secondary | ICD-10-CM | POA: Diagnosis not present

## 2017-07-29 DIAGNOSIS — C50312 Malignant neoplasm of lower-inner quadrant of left female breast: Secondary | ICD-10-CM | POA: Insufficient documentation

## 2017-07-29 DIAGNOSIS — C3411 Malignant neoplasm of upper lobe, right bronchus or lung: Secondary | ICD-10-CM | POA: Diagnosis not present

## 2017-07-29 HISTORY — DX: Personal history of irradiation: Z92.3

## 2017-09-04 ENCOUNTER — Ambulatory Visit
Admission: RE | Admit: 2017-09-04 | Discharge: 2017-09-04 | Disposition: A | Discharge status: Home / Self Care | Payer: Medicare Other | Source: Ambulatory Visit | Attending: Oncology | Admitting: Oncology

## 2017-09-04 ENCOUNTER — Inpatient Hospital Stay: Payer: Medicare Other | Attending: Oncology

## 2017-09-04 DIAGNOSIS — Z882 Allergy status to sulfonamides status: Secondary | ICD-10-CM | POA: Diagnosis not present

## 2017-09-04 DIAGNOSIS — M810 Age-related osteoporosis without current pathological fracture: Secondary | ICD-10-CM | POA: Diagnosis not present

## 2017-09-04 DIAGNOSIS — J449 Chronic obstructive pulmonary disease, unspecified: Secondary | ICD-10-CM | POA: Insufficient documentation

## 2017-09-04 DIAGNOSIS — R918 Other nonspecific abnormal finding of lung field: Secondary | ICD-10-CM | POA: Diagnosis not present

## 2017-09-04 DIAGNOSIS — E785 Hyperlipidemia, unspecified: Secondary | ICD-10-CM | POA: Insufficient documentation

## 2017-09-04 DIAGNOSIS — J439 Emphysema, unspecified: Secondary | ICD-10-CM | POA: Diagnosis not present

## 2017-09-04 DIAGNOSIS — C3431 Malignant neoplasm of lower lobe, right bronchus or lung: Secondary | ICD-10-CM | POA: Insufficient documentation

## 2017-09-04 DIAGNOSIS — Z923 Personal history of irradiation: Secondary | ICD-10-CM | POA: Insufficient documentation

## 2017-09-04 DIAGNOSIS — E279 Disorder of adrenal gland, unspecified: Secondary | ICD-10-CM | POA: Insufficient documentation

## 2017-09-04 DIAGNOSIS — I10 Essential (primary) hypertension: Secondary | ICD-10-CM | POA: Insufficient documentation

## 2017-09-04 DIAGNOSIS — I252 Old myocardial infarction: Secondary | ICD-10-CM | POA: Insufficient documentation

## 2017-09-04 DIAGNOSIS — I700 Atherosclerosis of aorta: Secondary | ICD-10-CM | POA: Insufficient documentation

## 2017-09-04 DIAGNOSIS — C3411 Malignant neoplasm of upper lobe, right bronchus or lung: Secondary | ICD-10-CM | POA: Insufficient documentation

## 2017-09-04 DIAGNOSIS — Z79811 Long term (current) use of aromatase inhibitors: Secondary | ICD-10-CM | POA: Insufficient documentation

## 2017-09-04 DIAGNOSIS — Z79899 Other long term (current) drug therapy: Secondary | ICD-10-CM | POA: Insufficient documentation

## 2017-09-04 DIAGNOSIS — Z170 Estrogen receptor positive status [ER+]: Secondary | ICD-10-CM | POA: Diagnosis not present

## 2017-09-04 DIAGNOSIS — Z7982 Long term (current) use of aspirin: Secondary | ICD-10-CM | POA: Diagnosis not present

## 2017-09-04 DIAGNOSIS — C50312 Malignant neoplasm of lower-inner quadrant of left female breast: Secondary | ICD-10-CM | POA: Diagnosis not present

## 2017-09-04 DIAGNOSIS — F17210 Nicotine dependence, cigarettes, uncomplicated: Secondary | ICD-10-CM | POA: Diagnosis not present

## 2017-09-04 DIAGNOSIS — R21 Rash and other nonspecific skin eruption: Secondary | ICD-10-CM | POA: Insufficient documentation

## 2017-09-04 DIAGNOSIS — R591 Generalized enlarged lymph nodes: Secondary | ICD-10-CM | POA: Insufficient documentation

## 2017-09-04 LAB — CREATININE, SERUM
Creatinine, Ser: 1.28 mg/dL — ABNORMAL HIGH (ref 0.44–1.00)
GFR, EST AFRICAN AMERICAN: 42 mL/min — AB (ref >60)
GFR, EST NON AFRICAN AMERICAN: 36 mL/min — AB (ref >60)

## 2017-09-04 MED ORDER — IOPAMIDOL (ISOVUE-300) INJECTION 61%
75.0000 mL | Freq: Once | INTRAVENOUS | Status: AC | PRN
  Administered 2017-09-04: 60 mL via INTRAVENOUS

## 2017-09-11 ENCOUNTER — Other Ambulatory Visit: Payer: Self-pay | Admitting: *Deleted

## 2017-09-11 ENCOUNTER — Ambulatory Visit: Payer: Medicare Other

## 2017-09-11 ENCOUNTER — Other Ambulatory Visit: Payer: Medicare Other

## 2017-09-11 ENCOUNTER — Ambulatory Visit: Payer: Medicare Other | Admitting: Oncology

## 2017-09-11 DIAGNOSIS — C3490 Malignant neoplasm of unspecified part of unspecified bronchus or lung: Secondary | ICD-10-CM

## 2017-09-11 NOTE — Progress Notes (Signed)
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  INTERVAL HISTORY: Patient returns to clinic today for further evaluation and discussion of her imaging results. She has a rash on her upper lip that is irritating, but otherwise feels well.  She continues to tolerate letrozole without significant side effects.  She has no neurologic complaints.  She denies any recent fevers or illnesses. He denies any chest pain, shortness of breath, cough, or hemoptysis. She denies any nausea, vomiting, constipation, or diarrhea. She has no urinary complaints. Patient offers no further specific complaints today.    REVIEW OF SYSTEMS:   Review of Systems  Constitutional: Negative.  Negative for fever, malaise/fatigue and weight loss.  Respiratory: Negative.  Negative for cough and shortness of breath.   Cardiovascular: Negative.  Negative for chest pain and leg swelling.  Gastrointestinal: Negative.  Negative for abdominal pain, blood in stool, constipation, diarrhea, nausea and vomiting.  Genitourinary: Negative.   Musculoskeletal: Negative.   Skin: Positive for rash.  Neurological: Negative.  Negative for tingling, sensory change, weakness and headaches.  Psychiatric/Behavioral: Negative.  The patient is not nervous/anxious and does not have insomnia.     As per HPI. Otherwise, a complete review of systems is negative.  PAST MEDICAL HISTORY: Past Medical History:  Diagnosis Date  . Breast cancer ([place]) 08/19/2012   LT LUMPECTOMY, radiation tx.   Marland Kitchen COPD (chronic obstructive pulmonary disease) ([place])   . Hailey-hailey disease   . Hyperlipemia   .  Hypertension   . Lung cancer ([place]) 2014   RT LUNG, radiation tx  . Migraine   . Myocardial infarction ([place])   . Osteoporosis   . Personal history of radiation therapy 2013   left breast ca  . Personal history of radiation therapy 2014   lung ca  . Radiation 2013   FOR BREAST CA  . Radiation 2014   FOR LUNG CA  . Renal artery stenosis ([place])     PAST SURGICAL HISTORY: Past Surgical History:  Procedure Laterality Date  . ABDOMINAL HYSTERECTOMY    . APPENDECTOMY    . BREAST BIOPSY Left 08/03/2012   invasive mammary carcinoma, u/s guided bx  . 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 cardiac stents    . PARATHYROIDECTOMY    . TONSILLECTOMY      FAMILY HISTORY Family History  Problem Relation Age of Onset  . Breast cancer Sister 56       ADVANCED DIRECTIVES:    HEALTH MAINTENANCE: Social History  Substance Use Topics  . Smoking status: Current Every Day Smoker    Packs/day: 1.00    Years: 70.00  . Smokeless tobacco: Never Used  . Alcohol use No      Allergies  Allergen Reactions  . Bactrim [Sulfamethoxazole-Trimethoprim] Other (See Comments)    Reduced kidney function.  Per Dr. Holley Raring should not take  . Macrobid [employer] Macro] Other (See Comments)    Reduced Kidney function.  Should no longer take per Dr. Holley Raring  . Tape Rash    Current Outpatient Prescriptions  Medication Sig Dispense Refill  . albuterol (PROAIR HFA) 108 (90 Base) MCG/ACT  inhaler     . amLODipine (NORVASC) 2.5 MG tablet Take 2.5 mg by mouth daily.    Marland Kitchen aspirin 325 MG tablet Take 325 mg by mouth daily.    . Calcium Carbonate-Vitamin D (CALCIUM-VITAMIN D) 500-200 MG-UNIT per tablet Take 1 tablet by mouth daily.    . fluticasone (FLONASE) 50 MCG/ACT nasal spray Place 2 sprays into both nostrils daily.    Marland Kitchen ketotifen (ZADITOR) 0.025 % ophthalmic solution 1 drop 2 (two) times daily.    Marland Kitchen letrozole (FEMARA) 2.5 MG tablet  TAKE 1 TABLET DAILY 90 tablet 2  . lisinopril (PRINIVIL,ZESTRIL) 20 MG tablet Take 20 mg by mouth daily.    . metoprolol succinate (TOPROL-XL) 50 MG 24 hr tablet Take 25 mg by mouth daily. Take with or immediately following a meal.    . Multiple Vitamin (MULTIVITAMIN) tablet Take 1 tablet by mouth daily.    . simvastatin (ZOCOR) 40 MG tablet Take 40 mg by mouth daily.    . traMADol (ULTRAM) 50 MG tablet Take by mouth every 6 (six) hours as needed.     No current facility-administered medications for this visit.    Facility-Administered Medications Ordered in Other Visits  Medication Dose Route Frequency Provider Last Rate Last Dose  . denosumab (PROLIA) injection 60 mg  60 mg Subcutaneous Once Lloyd Huger, MD        OBJECTIVE: Vitals:   09/14/17 0901  BP: (!) 160/82  Pulse: 69  Resp: 20  Temp: (!) 97.5 F (36.4 C)     Body mass index is 20.73 kg/m.    ECOG FS:0 - Asymptomatic  General: Well-developed, well-nourished, no acute distress. Eyes: Pink conjunctiva, anicteric sclera. Breasts: Bilateral breast and axilla without lumps or masses. Exam deferred today. Lungs: Clear to auscultation bilaterally. Heart: Regular rate and rhythm. No rubs, murmurs, or gallops. Abdomen: Soft, nontender, nondistended. No organomegaly noted, normoactive bowel sounds. Musculoskeletal: No edema, cyanosis, or clubbing. Neuro: Alert, answering all questions appropriately. Cranial nerves grossly intact. Skin: No rashes  Psych: Normal affect.   LAB RESULTS:  Lab Results  Component Value Date   NA 140 09/14/2017   K 4.9 09/14/2017   CL 104 09/14/2017   CO2 27 09/14/2017   GLUCOSE 123 (H) 09/14/2017   BUN 26 (H) 09/14/2017   CREATININE 1.22 (H) 09/14/2017   CALCIUM 9.7 09/14/2017   PROT 7.7 01/23/2016   ALBUMIN 4.2 01/23/2016   AST 19 01/23/2016   ALT 15 01/23/2016   ALKPHOS 82 01/23/2016   BILITOT 0.5 01/23/2016   GFRNONAA 39 (L) 09/14/2017   GFRAA 45 (L) 09/14/2017    Lab  Results  Component Value Date   WBC 8.5 01/23/2016   NEUTROABS 6.1 01/23/2016   HGB 14.3 01/23/2016   HCT 43.7 01/23/2016   MCV 94.0 01/23/2016   PLT 249 01/23/2016     STUDIES: Ct Chest W Contrast  Result Date: 09/04/2017 CLINICAL DATA:  Right lung cancer.  Left breast cancer. EXAM: CT CHEST WITH CONTRAST TECHNIQUE: Multidetector CT imaging of the chest was performed during intravenous contrast administration. CONTRAST:  42m ISOVUE-300 IOPAMIDOL (ISOVUE-300) INJECTION 61% COMPARISON:  06/04/2017. FINDINGS: Cardiovascular: The heart size is normal. Stable appearance trace pericardial effusion. Coronary artery calcification is evident. Atherosclerotic calcification is noted in the wall of the thoracic aorta. Mediastinum/Nodes: Multinodular thyroid again noted. No substantial change in the subcarinal lymph node measuring 13 mm short axis today compared to 12 mm short axis previously. No hilar lymphadenopathy. The esophagus has normal imaging  features. There is no axillary lymphadenopathy. Lungs/Pleura: Centrilobular and paraseptal emphysema noted. Similar appearance of post radiation scarring right upper lobe. Architectural distortion right lung base is similar to prior. No evidence for pulmonary edema or pleural effusion. Upper Abdomen: Probable perfusion anomaly noted medial segment left liver, stable. Stable appearance left adrenal nodularity measuring 18 mm today at the same level it was measured 19 mm on the prior study. Atherosclerotic calcification noted abdominal aorta 2.3 cm cystic lesion in the head of the pancreas similar to prior studies back to 07/22/2016. Musculoskeletal: Bone windows reveal no worrisome lytic or sclerotic osseous lesions. Old right rib fracture evident. IMPRESSION: 1. Stable right upper lobe changes of radiation fibrosis. 2. Architectural distortion right lung base similar to prior. 3. Similar appearance of subcarinal lymphadenopathy. Continued close attention  recommended. 4.  Emphysema. (ICD10-J43.9) 5. Coronary artery and Aortic Atherosclerois (ICD10-170.0) 6. Stable left adrenal lesions. Electronically Signed   By: Misty Stanley M.D.   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  PLAN:    1. Stage Ia right lower lobe lung carcinoma: Patient completed XRT on September 08, 2016. CT scan results from September 04, 2017 reviewed independently and reported as above with stable lung lesions as well as a stable subcarinal lymph node compared to 3 months prior. No intervention is needed at this time.  Repeat imaging in 6 months.  Return to clinic 1-2 days after her CT scan for further evaluation.   2. 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  Oncotype testing was not performed as this would not change the treatment plan. Continue letrozole completing in August 2019. Her most recent mammogram on July 29, 2017 was reported as BI-RADS 2.  Repeat in September 2019. 3. Osteoporosis: Patient's most recent bone mineral density on July 29, 2017 and was reported as -2.7 which is improved over one year prior.  Proceed with Prolia today.  Patient has been instructed to continue calcium and vitamin D.  Return to clinic in 6 months with repeat laboratory work and continuation of Prolia.  Repeat bone mineral density in September 2019.    4.  Stage Ia adenocarcinoma of the right upper lobe lung: Patient previously elected to proceed with XRT only. No intervention is needed at this time.  Patient is now nearly 2 years removed from completing her XRT to this lesion. CT scans as above.     Patient expressed  understanding and was in agreement with this plan. She also understands that She can call clinic at any time with any questions, concerns, or complaints.    Lloyd Huger, MD 09/14/17 9:26 AM

## 2017-09-14 ENCOUNTER — Inpatient Hospital Stay (HOSPITAL_BASED_OUTPATIENT_CLINIC_OR_DEPARTMENT_OTHER): Payer: Medicare Other | Admitting: Oncology

## 2017-09-14 ENCOUNTER — Inpatient Hospital Stay: Payer: Medicare Other

## 2017-09-14 VITALS — BP 160/82 | HR 69 | Temp 97.5°F | Resp 20 | Wt 117.0 lb

## 2017-09-14 DIAGNOSIS — E785 Hyperlipidemia, unspecified: Secondary | ICD-10-CM

## 2017-09-14 DIAGNOSIS — C50312 Malignant neoplasm of lower-inner quadrant of left female breast: Secondary | ICD-10-CM

## 2017-09-14 DIAGNOSIS — Z79811 Long term (current) use of aromatase inhibitors: Secondary | ICD-10-CM

## 2017-09-14 DIAGNOSIS — M810 Age-related osteoporosis without current pathological fracture: Secondary | ICD-10-CM | POA: Diagnosis not present

## 2017-09-14 DIAGNOSIS — I252 Old myocardial infarction: Secondary | ICD-10-CM

## 2017-09-14 DIAGNOSIS — Z7982 Long term (current) use of aspirin: Secondary | ICD-10-CM

## 2017-09-14 DIAGNOSIS — I10 Essential (primary) hypertension: Secondary | ICD-10-CM

## 2017-09-14 DIAGNOSIS — F17210 Nicotine dependence, cigarettes, uncomplicated: Secondary | ICD-10-CM | POA: Diagnosis not present

## 2017-09-14 DIAGNOSIS — Z170 Estrogen receptor positive status [ER+]: Secondary | ICD-10-CM

## 2017-09-14 DIAGNOSIS — Z923 Personal history of irradiation: Secondary | ICD-10-CM

## 2017-09-14 DIAGNOSIS — J449 Chronic obstructive pulmonary disease, unspecified: Secondary | ICD-10-CM

## 2017-09-14 DIAGNOSIS — R21 Rash and other nonspecific skin eruption: Secondary | ICD-10-CM

## 2017-09-14 DIAGNOSIS — Z79899 Other long term (current) drug therapy: Secondary | ICD-10-CM

## 2017-09-14 DIAGNOSIS — C3490 Malignant neoplasm of unspecified part of unspecified bronchus or lung: Secondary | ICD-10-CM

## 2017-09-14 DIAGNOSIS — C3431 Malignant neoplasm of lower lobe, right bronchus or lung: Secondary | ICD-10-CM

## 2017-09-14 DIAGNOSIS — Z882 Allergy status to sulfonamides status: Secondary | ICD-10-CM

## 2017-09-14 DIAGNOSIS — C3411 Malignant neoplasm of upper lobe, right bronchus or lung: Secondary | ICD-10-CM

## 2017-09-14 LAB — BASIC METABOLIC PANEL
Anion gap: 9 (ref 5–15)
BUN: 26 mg/dL — AB (ref 6–20)
CALCIUM: 9.7 mg/dL (ref 8.9–10.3)
CO2: 27 mmol/L (ref 22–32)
CREATININE: 1.22 mg/dL — AB (ref 0.44–1.00)
Chloride: 104 mmol/L (ref 101–111)
GFR calc Af Amer: 45 mL/min — ABNORMAL LOW (ref >60)
GFR calc non Af Amer: 39 mL/min — ABNORMAL LOW (ref >60)
Glucose, Bld: 123 mg/dL — ABNORMAL HIGH (ref 65–99)
Potassium: 4.9 mmol/L (ref 3.5–5.1)
SODIUM: 140 mmol/L (ref 135–145)

## 2017-09-14 MED ORDER — DENOSUMAB 60 MG/ML ~~LOC~~ SOLN
60.0000 mg | Freq: Once | SUBCUTANEOUS | Status: AC
  Administered 2017-09-14: 60 mg via SUBCUTANEOUS
  Filled 2017-09-14: qty 1

## 2017-09-14 NOTE — Progress Notes (Signed)
Patient denies any concerns today.  

## 2017-12-07 ENCOUNTER — Ambulatory Visit (INDEPENDENT_AMBULATORY_CARE_PROVIDER_SITE_OTHER): Payer: Self-pay | Admitting: Vascular Surgery

## 2017-12-14 ENCOUNTER — Ambulatory Visit (INDEPENDENT_AMBULATORY_CARE_PROVIDER_SITE_OTHER): Payer: Self-pay | Admitting: Vascular Surgery

## 2018-01-19 ENCOUNTER — Encounter (INDEPENDENT_AMBULATORY_CARE_PROVIDER_SITE_OTHER): Payer: Self-pay

## 2018-01-19 ENCOUNTER — Ambulatory Visit (INDEPENDENT_AMBULATORY_CARE_PROVIDER_SITE_OTHER): Payer: Self-pay | Admitting: Vascular Surgery

## 2018-01-28 ENCOUNTER — Other Ambulatory Visit (INDEPENDENT_AMBULATORY_CARE_PROVIDER_SITE_OTHER): Payer: Self-pay | Admitting: Vascular Surgery

## 2018-01-28 ENCOUNTER — Other Ambulatory Visit (INDEPENDENT_AMBULATORY_CARE_PROVIDER_SITE_OTHER): Payer: Self-pay | Admitting: Internal Medicine

## 2018-01-28 DIAGNOSIS — I739 Peripheral vascular disease, unspecified: Secondary | ICD-10-CM

## 2018-01-29 ENCOUNTER — Encounter (INDEPENDENT_AMBULATORY_CARE_PROVIDER_SITE_OTHER): Payer: Self-pay

## 2018-01-29 ENCOUNTER — Ambulatory Visit (INDEPENDENT_AMBULATORY_CARE_PROVIDER_SITE_OTHER): Payer: Medicare Other | Admitting: Vascular Surgery

## 2018-01-29 ENCOUNTER — Ambulatory Visit (INDEPENDENT_AMBULATORY_CARE_PROVIDER_SITE_OTHER): Payer: Medicare Other

## 2018-01-29 DIAGNOSIS — I739 Peripheral vascular disease, unspecified: Secondary | ICD-10-CM

## 2018-01-29 NOTE — Progress Notes (Signed)
    MRN : [id number]  Hibah Odonnell [facility] is a 84 y.o. (07/31/1928) female who presents with chief complaint of No chief complaint on file. .  Patient had her noninvasive studies and came in today to review but left before being seen even though we were not running behind.  She says she felt sick.  Her ABIs can be rechecked in 2 years.

## 2018-03-14 NOTE — Progress Notes (Signed)
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  INTERVAL HISTORY: Patient returns to clinic today for further evaluation.  She did not have a CT scan, therefore this will be scheduled in the next 1 to 2 weeks.  She currently feels well and is asymptomatic.  She continues to tolerate letrozole without significant side effects.  She has no neurologic complaints.  She denies any recent fevers or illnesses. He denies any chest pain, shortness of breath, cough, or hemoptysis. She denies any nausea, vomiting, constipation, or diarrhea. She has no urinary complaints.  Patient feels at her baseline offers no specific complaints today.  REVIEW OF SYSTEMS:   Review of Systems  Constitutional: Negative.  Negative for fever, malaise/fatigue and weight loss.  Respiratory: Negative.  Negative for cough and shortness of breath.   Cardiovascular: Negative.  Negative for chest pain and leg swelling.  Gastrointestinal: Negative.  Negative for abdominal pain, blood in stool, constipation, diarrhea, nausea and vomiting.  Genitourinary: Negative.   Musculoskeletal: Negative.   Skin: Negative.  Negative for rash.  Neurological: Negative.  Negative for tingling, sensory change, focal weakness, weakness and headaches.  Psychiatric/Behavioral: Negative.  The patient is not nervous/anxious and does not have insomnia.     As per HPI. Otherwise, a complete review of systems is negative.  PAST MEDICAL HISTORY: Past Medical History:  Diagnosis Date  . Breast cancer ([place]) 08/19/2012   LT LUMPECTOMY, radiation tx.   Marland Kitchen COPD (chronic obstructive pulmonary disease)  ([place])   . Hailey-hailey disease   . Hyperlipemia   . Hypertension   . Lung cancer ([place]) 2014   RT LUNG, radiation tx  . Migraine   . Myocardial infarction ([place])   . Osteoporosis   . Personal history of radiation therapy 2013   left breast ca  . Personal history of radiation therapy 2014   lung ca  . Radiation 2013   FOR BREAST CA  . Radiation 2014   FOR LUNG CA  . Renal artery stenosis ([place])     PAST SURGICAL HISTORY: Past Surgical History:  Procedure Laterality Date  . ABDOMINAL HYSTERECTOMY    . APPENDECTOMY    . BREAST BIOPSY Left 08/03/2012   invasive mammary carcinoma, u/s guided bx  . 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 cardiac stents    . PARATHYROIDECTOMY    . TONSILLECTOMY      FAMILY HISTORY Family History  Problem Relation Age of Onset  . Breast cancer Sister 12       ADVANCED DIRECTIVES:    HEALTH MAINTENANCE: Social History   Tobacco Use  . Smoking status: Current Every Day Smoker    Packs/day: 1.00    Years: 70.00    Pack years: 70.00  . Smokeless tobacco: Never Used  Substance Use Topics  . Alcohol use: No  . Drug use: No      Allergies  Allergen Reactions  . Bactrim [Sulfamethoxazole-Trimethoprim] Other (See Comments)    Reduced kidney function.  Per Dr. Holley Raring should not take  . Ibuprofen     Reduced kidney function. Per Dr. Holley Raring should not take  . Macrobid [  Nitrofurantoin Monohyd Macro] Other (See Comments)    Reduced Kidney function.  Should no longer take per Dr. Holley Raring  . Tape Rash    Current Outpatient Medications  Medication Sig Dispense Refill  . albuterol (PROAIR HFA) 108 (90 Base) MCG/ACT inhaler     . amLODipine (NORVASC) 2.5 MG tablet Take 2.5 mg by mouth daily.    Marland Kitchen aspirin 325 MG tablet Take 325 mg by mouth daily.    . calcipotriene-betamethasone (TACLONEX) ointment     . Calcium Carbonate-Vitamin D (CALCIUM-VITAMIN D) 500-200 MG-UNIT per tablet  Take 1 tablet by mouth daily.    . cephALEXin (KEFLEX) 500 MG capsule   0  . fluticasone (FLONASE) 50 MCG/ACT nasal spray Place 2 sprays into both nostrils daily.    Marland Kitchen ketotifen (ZADITOR) 0.025 % ophthalmic solution 1 drop 2 (two) times daily.    Marland Kitchen letrozole (FEMARA) 2.5 MG tablet TAKE 1 TABLET DAILY 90 tablet 2  . lisinopril (PRINIVIL,ZESTRIL) 20 MG tablet Take 20 mg by mouth daily.    . metoprolol succinate (TOPROL-XL) 50 MG 24 hr tablet Take 25 mg by mouth daily. Take with or immediately following a meal.    . Multiple Vitamin (MULTIVITAMIN) tablet Take 1 tablet by mouth daily.    . simvastatin (ZOCOR) 40 MG tablet Take 40 mg by mouth daily.    . traMADol (ULTRAM) 50 MG tablet Take by mouth every 6 (six) hours as needed.     No current facility-administered medications for this visit.     OBJECTIVE: Vitals:   03/15/18 1204  BP: 129/73  Pulse: 65  Resp: 18  Temp: (!) 96.7 F (35.9 C)     Body mass index is 19.98 kg/m.    ECOG FS:0 - Asymptomatic  General: Well-developed, well-nourished, no acute distress. Eyes: Pink conjunctiva, anicteric sclera. Breast: Patient requested exam be deferred today. Lungs: Clear to auscultation bilaterally. Heart: Regular rate and rhythm. No rubs, murmurs, or gallops. Abdomen: Soft, nontender, nondistended. No organomegaly noted, normoactive bowel sounds. Musculoskeletal: No edema, cyanosis, or clubbing. Neuro: Alert, answering all questions appropriately. Cranial nerves grossly intact. Skin: No rashes or petechiae noted. Psych: Normal affect.  LAB RESULTS:  Lab Results  Component Value Date   NA 139 03/15/2018   K 4.3 03/15/2018   CL 104 03/15/2018   CO2 25 03/15/2018   GLUCOSE 81 03/15/2018   BUN 31 (H) 03/15/2018   CREATININE 1.31 (H) 03/15/2018   CALCIUM 9.9 03/15/2018   PROT 7.7 01/23/2016   ALBUMIN 4.2 01/23/2016   AST 19 01/23/2016   ALT 15 01/23/2016   ALKPHOS 82 01/23/2016   BILITOT 0.5 01/23/2016   GFRNONAA 35 (L)  03/15/2018   GFRAA 41 (L) 03/15/2018    Lab Results  Component Value Date   WBC 8.5 01/23/2016   NEUTROABS 6.1 01/23/2016   HGB 14.3 01/23/2016   HCT 43.7 01/23/2016   MCV 94.0 01/23/2016   PLT 249 01/23/2016     STUDIES: No results found.  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  PLAN:    1. Stage Ia right lower lobe lung carcinoma: Patient completed XRT on September 08, 2016. CT scan results from September 04, 2017 reviewed independently with stable lung lesions as well as a stable subcarinal lymph node compared to 3 months prior. No intervention is needed at this  time.  Repeat CT scan in the next 1 to 2 weeks.  Return to clinic in 6 months for routine evaluation.  2. 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  Oncotype testing was not performed as this would not change the treatment plan.  Patient has been instructed to continue her letrozole until August 2019 and then she can discontinue completing 5 years of treatment.  Her most recent mammogram on July 29, 2017 was reported as BI-RADS 2.  Repeat in September 2019. 3. Osteoporosis: Patient's most recent bone mineral density on July 29, 2017 and was reported as -2.7 which is improved over one year prior.  Proceed with Prolia today.  Continue calcium and vitamin D supplementation.  Return to clinic as above with repeat laboratory work and continuation of Prolia.  Repeat bone mineral density in September 2019.   4.  Stage Ia adenocarcinoma of the right upper lobe lung: Patient previously elected to proceed with XRT only. No intervention is needed at this time.  Continue CT scans as  above.  Approximately 30 minutes was spent in discussion of which greater than 50% was consultation.  Patient expressed understanding and was in agreement with this plan. She also understands that She can call clinic at any time with any questions, concerns, or complaints.    Lloyd Huger, MD 03/16/18 11:08 PM

## 2018-03-15 ENCOUNTER — Inpatient Hospital Stay: Payer: Medicare Other

## 2018-03-15 ENCOUNTER — Other Ambulatory Visit: Payer: Self-pay

## 2018-03-15 ENCOUNTER — Encounter: Payer: Self-pay | Admitting: Oncology

## 2018-03-15 ENCOUNTER — Inpatient Hospital Stay (HOSPITAL_BASED_OUTPATIENT_CLINIC_OR_DEPARTMENT_OTHER): Payer: Medicare Other | Admitting: Oncology

## 2018-03-15 ENCOUNTER — Inpatient Hospital Stay: Payer: Medicare Other | Attending: Oncology

## 2018-03-15 VITALS — BP 129/73 | HR 65 | Temp 96.7°F | Resp 18 | Wt 112.8 lb

## 2018-03-15 DIAGNOSIS — C3431 Malignant neoplasm of lower lobe, right bronchus or lung: Secondary | ICD-10-CM | POA: Diagnosis not present

## 2018-03-15 DIAGNOSIS — M810 Age-related osteoporosis without current pathological fracture: Secondary | ICD-10-CM | POA: Diagnosis not present

## 2018-03-15 DIAGNOSIS — Z170 Estrogen receptor positive status [ER+]: Secondary | ICD-10-CM

## 2018-03-15 DIAGNOSIS — C3411 Malignant neoplasm of upper lobe, right bronchus or lung: Secondary | ICD-10-CM

## 2018-03-15 DIAGNOSIS — C50312 Malignant neoplasm of lower-inner quadrant of left female breast: Secondary | ICD-10-CM | POA: Diagnosis not present

## 2018-03-15 LAB — BASIC METABOLIC PANEL
Anion gap: 10 (ref 5–15)
BUN: 31 mg/dL — AB (ref 6–20)
CO2: 25 mmol/L (ref 22–32)
CREATININE: 1.31 mg/dL — AB (ref 0.44–1.00)
Calcium: 9.9 mg/dL (ref 8.9–10.3)
Chloride: 104 mmol/L (ref 101–111)
GFR calc Af Amer: 41 mL/min — ABNORMAL LOW (ref >60)
GFR calc non Af Amer: 35 mL/min — ABNORMAL LOW (ref >60)
GLUCOSE: 81 mg/dL (ref 65–99)
Potassium: 4.3 mmol/L (ref 3.5–5.1)
Sodium: 139 mmol/L (ref 135–145)

## 2018-03-15 MED ORDER — DENOSUMAB 60 MG/ML ~~LOC~~ SOSY
60.0000 mg | PREFILLED_SYRINGE | Freq: Once | SUBCUTANEOUS | Status: AC
  Administered 2018-03-15: 60 mg via SUBCUTANEOUS
  Filled 2018-03-15: qty 1

## 2018-03-15 NOTE — Progress Notes (Signed)
Patient here for follow. Offers no concerns.

## 2018-03-17 ENCOUNTER — Inpatient Hospital Stay: Payer: Medicare Other

## 2018-03-18 ENCOUNTER — Ambulatory Visit
Admission: RE | Admit: 2018-03-18 | Discharge: 2018-03-18 | Disposition: A | Discharge status: Home / Self Care | Payer: Medicare Other | Source: Ambulatory Visit | Attending: Oncology | Admitting: Oncology

## 2018-03-18 DIAGNOSIS — C3411 Malignant neoplasm of upper lobe, right bronchus or lung: Secondary | ICD-10-CM | POA: Insufficient documentation

## 2018-03-18 DIAGNOSIS — J432 Centrilobular emphysema: Secondary | ICD-10-CM | POA: Insufficient documentation

## 2018-03-18 DIAGNOSIS — I719 Aortic aneurysm of unspecified site, without rupture: Secondary | ICD-10-CM | POA: Diagnosis not present

## 2018-03-18 DIAGNOSIS — I2510 Atherosclerotic heart disease of native coronary artery without angina pectoris: Secondary | ICD-10-CM | POA: Insufficient documentation

## 2018-03-18 DIAGNOSIS — I700 Atherosclerosis of aorta: Secondary | ICD-10-CM | POA: Diagnosis not present

## 2018-03-18 MED ORDER — IOPAMIDOL (ISOVUE-300) INJECTION 61%
50.0000 mL | Freq: Once | INTRAVENOUS | Status: AC | PRN
  Administered 2018-03-18: 50 mL via INTRAVENOUS

## 2018-03-23 ENCOUNTER — Other Ambulatory Visit: Payer: Self-pay | Admitting: Oncology

## 2018-03-26 ENCOUNTER — Other Ambulatory Visit: Payer: Self-pay | Admitting: *Deleted

## 2018-03-26 DIAGNOSIS — C3432 Malignant neoplasm of lower lobe, left bronchus or lung: Secondary | ICD-10-CM

## 2018-03-26 NOTE — Progress Notes (Signed)
pet

## 2018-03-30 ENCOUNTER — Telehealth: Payer: Self-pay | Admitting: *Deleted

## 2018-03-30 NOTE — Telephone Encounter (Signed)
Received a msg from answering service-daughter requesting call back from [facility] to discuss her mother's upcoming apts.  Returned phone call to pt's daughter.  Daughter wanted to know if a f/u apt was made s/p pet scan. I explained to her that an apt was scheduled for 5/21 at 1015am. She inquired about the purpose of the pet scan. I explained to her that the last ct scan of chest was abnormal and Dr. Grayland Ormond would like a pet scan to determine the next step in the treatment plan. She gave verbal understanding of the plan of care.

## 2018-04-02 ENCOUNTER — Encounter
Admission: RE | Admit: 2018-04-02 | Discharge: 2018-04-02 | Disposition: A | Discharge status: Home / Self Care | Payer: Medicare Other | Source: Ambulatory Visit | Attending: Oncology | Admitting: Oncology

## 2018-04-02 DIAGNOSIS — C3432 Malignant neoplasm of lower lobe, left bronchus or lung: Secondary | ICD-10-CM | POA: Diagnosis not present

## 2018-04-02 LAB — GLUCOSE, CAPILLARY: Glucose-Capillary: 89 mg/dL (ref 65–99)

## 2018-04-02 MED ORDER — FLUDEOXYGLUCOSE F - 18 (FDG) INJECTION
5.9500 | Freq: Once | INTRAVENOUS | Status: AC | PRN
  Administered 2018-04-02: 5.95 via INTRAVENOUS

## 2018-04-04 ENCOUNTER — Other Ambulatory Visit: Payer: Self-pay | Admitting: Oncology

## 2018-04-04 NOTE — Progress Notes (Signed)
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  INTERVAL HISTORY: Patient returns to clinic today for further evaluation and discussion of her imaging results.  She continues to feel well and remains asymptomatic.  She is tolerating letrozole without significant side effects.  She has no neurologic complaints.  She denies any recent fevers or illnesses.  She denies any chest pain, shortness of breath, cough, or hemoptysis. She denies any nausea, vomiting, constipation, or diarrhea. She has no urinary complaints.  Patient offers no specific complaints today.  REVIEW OF SYSTEMS:   Review of Systems  Constitutional: Negative.  Negative for fever, malaise/fatigue and weight loss.  Respiratory: Negative.  Negative for cough and shortness of breath.   Cardiovascular: Negative.  Negative for chest pain and leg swelling.  Gastrointestinal: Negative.  Negative for abdominal pain, blood in stool, constipation, diarrhea, nausea and vomiting.  Genitourinary: Negative.   Musculoskeletal: Negative.   Skin: Negative.  Negative for rash.  Neurological: Negative.  Negative for tingling, sensory change, focal weakness, weakness and headaches.  Psychiatric/Behavioral: Negative.  The patient is not nervous/anxious and does not have insomnia.     As per HPI. Otherwise, a complete review of systems is negative.  PAST MEDICAL HISTORY: Past Medical History:  Diagnosis Date  . Breast cancer ([place]) 08/19/2012   LT LUMPECTOMY, radiation tx.   Marland Kitchen COPD (chronic obstructive pulmonary disease) ([place])   . Hailey-hailey disease   . Hyperlipemia   . Hypertension     . Lung cancer ([place]) 2014   RT LUNG, radiation tx  . Migraine   . Myocardial infarction ([place])   . Osteoporosis   . Personal history of radiation therapy 2013   left breast ca  . Personal history of radiation therapy 2014   lung ca  . Radiation 2013   FOR BREAST CA  . Radiation 2014   FOR LUNG CA  . Renal artery stenosis ([place])     PAST SURGICAL HISTORY: Past Surgical History:  Procedure Laterality Date  . ABDOMINAL HYSTERECTOMY    . APPENDECTOMY    . BREAST BIOPSY Left 08/03/2012   invasive mammary carcinoma, u/s guided bx  . 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 cardiac stents    . PARATHYROIDECTOMY    . TONSILLECTOMY      FAMILY HISTORY Family History  Problem Relation Age of Onset  . Breast cancer Sister 79       ADVANCED DIRECTIVES:    HEALTH MAINTENANCE: Social History   Tobacco Use  . Smoking status: Current Every Day Smoker    Packs/day: 1.00    Years: 70.00    Pack years: 70.00  . Smokeless tobacco: Never Used  Substance Use Topics  . Alcohol use: No  . Drug use: No      Allergies  Allergen Reactions  . Bactrim [Sulfamethoxazole-Trimethoprim] Other (See Comments)    Reduced kidney function.  Per Dr. Holley Raring should not take  . Ibuprofen     Reduced kidney function. Per Dr. Holley Raring should not take  . Macrobid [employer] Macro] Other (See Comments)    Reduced Kidney function.  Should no longer  take per Dr. Holley Raring  . Tape Rash    Current Outpatient Medications  Medication Sig Dispense Refill  . amLODipine (NORVASC) 2.5 MG tablet Take 2.5 mg by mouth daily.    Marland Kitchen aspirin 325 MG tablet Take 325 mg by mouth daily.    . calcipotriene-betamethasone (TACLONEX) ointment     . Calcium Carbonate-Vitamin D (CALCIUM-VITAMIN D) 500-200 MG-UNIT per tablet Take 1 tablet by mouth daily.    Marland Kitchen ketotifen (ZADITOR) 0.025 % ophthalmic solution 1 drop 2 (two) times daily.    Marland Kitchen letrozole  (FEMARA) 2.5 MG tablet TAKE 1 TABLET DAILY 90 tablet 2  . lisinopril (PRINIVIL,ZESTRIL) 20 MG tablet Take 20 mg by mouth daily.    . metoprolol succinate (TOPROL-XL) 50 MG 24 hr tablet Take 25 mg by mouth daily. Take with or immediately following a meal.    . Multiple Vitamin (MULTIVITAMIN) tablet Take 1 tablet by mouth daily.    . simvastatin (ZOCOR) 40 MG tablet Take 40 mg by mouth daily.    Marland Kitchen albuterol (PROAIR HFA) 108 (90 Base) MCG/ACT inhaler     . fluticasone (FLONASE) 50 MCG/ACT nasal spray Place 2 sprays into both nostrils daily.    . TOPROL XL 25 MG 24 hr tablet     . traMADol (ULTRAM) 50 MG tablet Take by mouth every 6 (six) hours as needed.     No current facility-administered medications for this visit.     OBJECTIVE: Vitals:   04/06/18 1031  BP: 114/69  Pulse: 72  Resp: 20  Temp: 98.2 F (36.8 C)     Body mass index is 20.16 kg/m.    ECOG FS:0 - Asymptomatic  General: Well-developed, well-nourished, no acute distress. Eyes: Pink conjunctiva, anicteric sclera. Lungs: Clear to auscultation bilaterally. Heart: Regular rate and rhythm. No rubs, murmurs, or gallops. Abdomen: Soft, nontender, nondistended. No organomegaly noted, normoactive bowel sounds. Musculoskeletal: No edema, cyanosis, or clubbing. Neuro: Alert, answering all questions appropriately. Cranial nerves grossly intact. Skin: No rashes or petechiae noted. Psych: Normal affect.  LAB RESULTS:  Lab Results  Component Value Date   NA 139 03/15/2018   K 4.3 03/15/2018   CL 104 03/15/2018   CO2 25 03/15/2018   GLUCOSE 81 03/15/2018   BUN 31 (H) 03/15/2018   CREATININE 1.31 (H) 03/15/2018   CALCIUM 9.9 03/15/2018   PROT 7.7 01/23/2016   ALBUMIN 4.2 01/23/2016   AST 19 01/23/2016   ALT 15 01/23/2016   ALKPHOS 82 01/23/2016   BILITOT 0.5 01/23/2016   GFRNONAA 35 (L) 03/15/2018   GFRAA 41 (L) 03/15/2018    Lab Results  Component Value Date   WBC 8.5 01/23/2016   NEUTROABS 6.1 01/23/2016   HGB  14.3 01/23/2016   HCT 43.7 01/23/2016   MCV 94.0 01/23/2016   PLT 249 01/23/2016     STUDIES: Ct Chest W Contrast  Result Date: 03/18/2018 CLINICAL DATA:  84 year old female with history of adenocarcinoma of the left breast and bronchogenic adenocarcinoma in the right upper lobe of the lung. EXAM: CT CHEST WITH CONTRAST TECHNIQUE: Multidetector CT imaging of the chest was performed during intravenous contrast administration. CONTRAST:  40m ISOVUE-300 IOPAMIDOL (ISOVUE-300) INJECTION 61% COMPARISON:  Multiple priors, most recently chest CT 09/04/2017. FINDINGS: Cardiovascular: Heart size is normal. There is no significant pericardial fluid, thickening or pericardial calcification. There is aortic atherosclerosis, as well as atherosclerosis of the great vessels of the mediastinum and the coronary arteries, including calcified atherosclerotic plaque in the left main, left anterior descending,  left circumflex and right coronary arteries. Aneurysmal dilatation of the descending thoracic aorta measuring up to 3.9 cm in diameter with increasing peripheral low-attenuation which likely reflects a combination of mural thrombus and/or atheromatous plaque. Mediastinum/Nodes: No pathologically enlarged mediastinal or hilar lymph nodes. Esophagus is unremarkable in appearance. No axillary lymphadenopathy. Lungs/Pleura: When compared to prior examinations, the previously described right lower lobe lesion appears more prominent, best appreciated on axial image 124 of series 3 where the macrolobulated nodule with spiculated margins currently measures 13 x 18 mm with some surrounding septal thickening. This lesion is also clearly more nodular in appearance when viewed on coronal and sagittal reconstructions (coronal image 57 of series 5 and sagittal image 40 of series 6). Previously described area of chronic architectural distortion in the right upper lobe which has a band like quality is relatively similar to the most  recent prior study, most compatible with chronic postradiation changes. No other definite suspicious appearing pulmonary nodules or masses are noted. Diffuse bronchial wall thickening with mild to moderate centrilobular and paraseptal emphysema. Upper Abdomen: Calcified granuloma in the liver incidentally noted. Extensive aortic atherosclerosis. Multifocal nodularity throughout the adrenal gland which is partially calcified, similar to multiple prior examinations (previously not hypermetabolic on PET-CT 34/19/3790), presumably benign. Musculoskeletal: Old healed fracture of the lateral aspect of the right third rib. There are no aggressive appearing lytic or blastic lesions noted in the visualized portions of the skeleton. IMPRESSION: 1. Increasing nodularity in the base of the right lower lobe, concerning for locally recurrent disease. Given that radiation therapy was completed at the end of 2017, evolving progressive postradiation mass-like fibrosis is less likely. Accordingly, further evaluation with repeat PET-CT is recommended in the near future to better evaluate this finding. 2. Aortic atherosclerosis, in addition to left main and 3 vessel coronary artery disease. 3. There is also increasing aneurysmal dilatation of the descending thoracic aorta which currently measures up to 3.9 cm in diameter. Recommend annual imaging followup by CTA or MRA. This recommendation follows 2010 ACCF/AHA/AATS/ACR/ASA/SCA/SCAI/SIR/STS/SVM Guidelines for the Diagnosis and Management of Patients with Thoracic Aortic Disease. Circulation.2010; 121: W409-B353. 4. Diffuse bronchial wall thickening with mild to moderate centrilobular and paraseptal emphysema; imaging findings suggestive of underlying COPD. Aortic Atherosclerosis (ICD10-I70.0), Aortic aneurysm NOS (ICD10-I71.9) and Emphysema (ICD10-J43.9). Electronically Signed   By: Vinnie Langton M.D.   On: 03/18/2018 12:11   Nm Pet Image Restag (ps) Skull Base To Thigh  Result  Date: 04/02/2018 CLINICAL DATA:  Subsequent treatment strategy for left lower lobe lung cancer. EXAM: NUCLEAR MEDICINE PET SKULL BASE TO THIGH TECHNIQUE: 6.0 mCi F-18 FDG was injected intravenously. Full-ring PET imaging was performed from the skull base to thigh after the radiotracer. CT data was obtained and used for attenuation correction and anatomic localization. Fasting blood glucose: 89 mg/dl COMPARISON:  Multiple exams, including chest CT 03/18/2018 and PET-CT dated 07/31/2016 FINDINGS: Mediastinal blood pool activity: SUV max 2.0 NECK: No significant abnormal hypermetabolic activity in this region. Incidental CT findings: Bilateral carotid atherosclerotic calcification. CHEST: A hyperdense and hypermetabolic right eccentric subcarinal lymph node measures 1.0 cm in short axis on image 92/4 and has maximum standard uptake value of 10.1. In this context the appearance is concerning for recurrent malignancy. The region nodular density along the right hemidiaphragm demonstrates only faintly accentuated metabolic activity, maximum SUV about 1.1, and accordingly is not specific for malignancy although negative predictive value may be adversely affected by breathing motion of the diaphragm causing blurring of the lesion. There is volume  loss adjacent to the descending thoracic aortic aneurysm upper margin, with some faintly accentuated activity along this area of left lower lobe volume loss, as well as airway plugging, maximum SUV 3.5. There is a band of density in the right upper lobe along the minor fissure, maximum SUV 1.8. Likely related to prior therapy. Incidental CT findings: Coronary, aortic arch, and branch vessel atherosclerotic vascular disease. Descending thoracic aortic aneurysm about 4.4 cm in diameter. Bilateral airway thickening with some airway plugging in both lower lobes. Centrilobular emphysema. ABDOMEN/PELVIS: No significant abnormal hypermetabolic activity in this region. Incidental CT  findings: Aortoiliac atherosclerotic vascular disease. Partially calcified left adrenal mass is otherwise low-density compatible with adenoma. Infrarenal abdominal aortic aneurysm about 4.4 cm transverse. Sigmoid colon diverticulosis. Pelvic floor laxity. Hypodense and relatively photopenic pancreatic lesion measuring 2.4 by 1.6 cm, slowly enlarging from original lesion measuring 0.7 cm in diameter on 01/29/2005. SKELETON: No significant abnormal hypermetabolic activity in this region. Incidental CT findings: Healing right third rib fracture anterolaterally. Dextroconvex lumbar scoliosis. IMPRESSION: 1. Subcarinal adenopathy is both hyperdense and hypermetabolic, compatible with residual malignancy. 2. The somewhat irregular nodule along the right lung base is not appreciably hypermetabolic, maximum SUV only about 1.1, but is also expected to be blurred by motion artifact from the diaphragm during imaging and accordingly negative predictive value for this lesion is reduced. Surveillance imaging is likely warranted. 3. There is some volume loss and airway plugging in the left lower lobe adjacent to the thoracic aortic aneurysm. Faintly accentuated metabolic activity in this vicinity with maximum SUV 3.5, meriting surveillance to exclude the unlikely possibility of a metastatic focus. 4. Aortic aneurysm NOS (ICD10-I71.9). Aneurysms include a 4.4 cm descending thoracic aortic aneurysm and a 4.4 cm infrarenal abdominal aortic aneurysm. 5. Slow enlargement of a lesion of cystic lesion of the uncinate process of the pancreas, increasing from about 7 mm in diameter back in 2006 to 2.4 cm in long axis today. This could represent an indolent intraductal papillary mucinous neoplasm or a benign cystic lesion. In light of the patient's age and comorbidities, surveillance is probably appropriate rather than more aggressive approach. 6. Other imaging findings of potential clinical significance: Aortic Atherosclerosis  (ICD10-I70.0) and Emphysema (ICD10-J43.9). Airway thickening is present, suggesting bronchitis or reactive airways disease. Left adrenal adenoma, partially calcified. Pelvic floor laxity. Electronically Signed   By: Van Clines M.D.   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  PLAN:    1. Stage Ia right lower lobe lung carcinoma: Patient completed XRT on September 08, 2016.  PET scan results reviewed independently report as above with possible recurrence and subcarinal node.  Case was discussed at cancer conference as well.  Patient is stated she does not wish to have systemic chemotherapy, but would undergo XRT if possible.  A referral has been sent to radiation oncology.  Return to clinic 3 months after the conclusion of her XRT with repeat imaging and further evaluation.   2. 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  Oncotype testing was not performed as this would not change the treatment plan.  Continue letrozole until August 2019 completing 5 years of treatment.  Her  most recent mammogram on July 29, 2017 was reported as BI-RADS 2.  Repeat in September 2019. 3. Osteoporosis: Patient's most recent bone mineral density on July 29, 2017 and was reported as -2.7 which is improved over one year prior.  Proceed with Prolia today.  Continue calcium and vitamin D supplementation.  Return to clinic as above with repeat laboratory work and continuation of Prolia.  Repeat bone mineral density in September 2019.   4.  Stage Ia adenocarcinoma of the right upper lobe lung: Patient previously elected to proceed with XRT only. No intervention  is needed at this time.  No evidence of local recurrence.  PET scan as above.  Approximately 30 minutes was spent in discussion of which greater than 50% was consultation.  Patient expressed understanding and was in agreement with this plan. She also understands that She can call clinic at any time with any questions, concerns, or complaints.    Lloyd Huger, MD 04/09/18 9:59 AM

## 2018-04-06 ENCOUNTER — Other Ambulatory Visit: Payer: Self-pay

## 2018-04-06 ENCOUNTER — Inpatient Hospital Stay: Payer: Medicare Other | Attending: Oncology | Admitting: Oncology

## 2018-04-06 VITALS — BP 114/69 | HR 72 | Temp 98.2°F | Resp 20 | Wt 113.8 lb

## 2018-04-06 DIAGNOSIS — C3431 Malignant neoplasm of lower lobe, right bronchus or lung: Secondary | ICD-10-CM | POA: Diagnosis not present

## 2018-04-06 DIAGNOSIS — Z85118 Personal history of other malignant neoplasm of bronchus and lung: Secondary | ICD-10-CM | POA: Diagnosis not present

## 2018-04-06 DIAGNOSIS — I10 Essential (primary) hypertension: Secondary | ICD-10-CM

## 2018-04-06 DIAGNOSIS — Z170 Estrogen receptor positive status [ER+]: Secondary | ICD-10-CM | POA: Diagnosis not present

## 2018-04-06 DIAGNOSIS — Z79811 Long term (current) use of aromatase inhibitors: Secondary | ICD-10-CM

## 2018-04-06 DIAGNOSIS — C50312 Malignant neoplasm of lower-inner quadrant of left female breast: Secondary | ICD-10-CM | POA: Diagnosis not present

## 2018-04-06 DIAGNOSIS — M810 Age-related osteoporosis without current pathological fracture: Secondary | ICD-10-CM

## 2018-04-06 DIAGNOSIS — C3411 Malignant neoplasm of upper lobe, right bronchus or lung: Secondary | ICD-10-CM

## 2018-04-06 NOTE — Progress Notes (Signed)
Here for follow up  Per pt " I feel fine "

## 2018-04-14 DIAGNOSIS — M79675 Pain in left toe(s): Secondary | ICD-10-CM

## 2018-04-14 DIAGNOSIS — M79674 Pain in right toe(s): Secondary | ICD-10-CM | POA: Insufficient documentation

## 2018-04-22 ENCOUNTER — Encounter: Payer: Self-pay | Admitting: Radiation Oncology

## 2018-04-22 ENCOUNTER — Other Ambulatory Visit: Payer: Self-pay

## 2018-04-22 ENCOUNTER — Ambulatory Visit
Admission: RE | Admit: 2018-04-22 | Discharge: 2018-04-22 | Disposition: A | Discharge status: Home / Self Care | Payer: Medicare Other | Source: Ambulatory Visit | Attending: Radiation Oncology | Admitting: Radiation Oncology

## 2018-04-22 VITALS — BP 139/79 | HR 88 | Temp 96.7°F | Resp 20 | Wt 114.6 lb

## 2018-04-22 DIAGNOSIS — C3411 Malignant neoplasm of upper lobe, right bronchus or lung: Secondary | ICD-10-CM | POA: Insufficient documentation

## 2018-04-22 DIAGNOSIS — F17210 Nicotine dependence, cigarettes, uncomplicated: Secondary | ICD-10-CM | POA: Insufficient documentation

## 2018-04-22 DIAGNOSIS — R599 Enlarged lymph nodes, unspecified: Secondary | ICD-10-CM | POA: Diagnosis not present

## 2018-04-22 DIAGNOSIS — C3431 Malignant neoplasm of lower lobe, right bronchus or lung: Secondary | ICD-10-CM

## 2018-04-22 NOTE — Progress Notes (Signed)
Radiation Oncology Follow up Note Old patient new area mediastinal nodes  Name: [name] [name]   Date:   04/22/2018 MRN:  [id number] DOB: 05/21/1928    This 84 y.o. female presents to the clinic today for evaluation of hypermetabolic activity in mediastinal nodes in patient.  REFERRING PROVIDER: Ezequiel Kayser, MD  HPI: patient is a 84 year old female previously treated to both breasts as well as SB RT to right lower lobe for non-small cell lung cancer. She had radiation back in 2014 to a right upper lobe adenocarcinoma. She also completed SB RT to a right lower lobe for non-small cell lung cancer. She also had whole breast radiation back in 2014 for ER/PR positive invasive mammary carcinoma. She has done well..she recently had a PET CT scan back May 17 showing hypermetabolic activity in subcarinal adenopathy compatible with residual malignancy. She also has irregular nodule on the right lung base not appreciably hypermetabolic. She has a slight cough no hemoptysis. She somewhat fatigued.  COMPLICATIONS OF TREATMENT: none  FOLLOW UP COMPLIANCE: keeps appointments   PHYSICAL EXAM:  BP 139/79   Pulse 88   Temp (!) 96.7 F (35.9 C)   Resp 20   Wt 114 lb 10.2 oz (52 kg)   BMI 20.31 kg/m  Well-developed well-nourished patient in NAD. HEENT reveals PERLA, EOMI, discs not visualized.  Oral cavity is clear. No oral mucosal lesions are identified. Neck is clear without evidence of cervical or supraclavicular adenopathy. Lungs are clear to A&P. Cardiac examination is essentially unremarkable with regular rate and rhythm without murmur rub or thrill. Abdomen is benign with no organomegaly or masses noted. Motor sensory and DTR levels are equal and symmetric in the upper and lower extremities. Cranial nerves II through XII are grossly intact. Proprioception is intact. No peripheral adenopathy or edema is identified. No motor or sensory levels are noted. Crude visual fields are within normal  range.  RADIOLOGY RESULTS: PET CT scan reviewed and compatible with the above-stated findings  PLAN: this time like to go ahead with radiation therapy to her mediastinal node. I would use PET fusion CT T scan with my treatment planning. Would plan on deliveringup to 6600 cGy using IM RT radiation therapy to avoid previously irradiated lung fields esophagus spinal cord and heart. Risks and benefits of treatment including dysphasia fatigue alteration of blood counts possible development of worsening of her cough skin reaction all were discussed in detail with the patient and her daughter. They both seem to comprehend my treatment plan well. I have personally set up and ordered CT simulation for next week.  I would like to take this opportunity to thank you for allowing me to participate in the care of your patient.Noreene Filbert, MD

## 2018-04-27 ENCOUNTER — Ambulatory Visit: Payer: Medicare Other

## 2018-04-28 ENCOUNTER — Ambulatory Visit: Payer: Medicare Other

## 2018-05-04 ENCOUNTER — Ambulatory Visit
Admission: RE | Admit: 2018-05-04 | Discharge: 2018-05-04 | Disposition: A | Discharge status: Home / Self Care | Payer: Medicare Other | Source: Ambulatory Visit | Attending: Radiation Oncology | Admitting: Radiation Oncology

## 2018-05-04 DIAGNOSIS — Z510 Encounter for antineoplastic radiation therapy: Secondary | ICD-10-CM | POA: Diagnosis not present

## 2018-05-04 DIAGNOSIS — C50312 Malignant neoplasm of lower-inner quadrant of left female breast: Secondary | ICD-10-CM | POA: Diagnosis not present

## 2018-05-04 DIAGNOSIS — Z85118 Personal history of other malignant neoplasm of bronchus and lung: Secondary | ICD-10-CM | POA: Diagnosis not present

## 2018-05-04 DIAGNOSIS — R590 Localized enlarged lymph nodes: Secondary | ICD-10-CM | POA: Diagnosis not present

## 2018-05-06 DIAGNOSIS — Z510 Encounter for antineoplastic radiation therapy: Secondary | ICD-10-CM | POA: Diagnosis not present

## 2018-05-07 ENCOUNTER — Other Ambulatory Visit: Payer: Self-pay | Admitting: *Deleted

## 2018-05-07 DIAGNOSIS — C3491 Malignant neoplasm of unspecified part of right bronchus or lung: Secondary | ICD-10-CM

## 2018-05-12 ENCOUNTER — Ambulatory Visit
Admission: RE | Admit: 2018-05-12 | Discharge: 2018-05-12 | Disposition: A | Discharge status: Home / Self Care | Payer: Medicare Other | Source: Ambulatory Visit | Attending: Radiation Oncology | Admitting: Radiation Oncology

## 2018-05-13 ENCOUNTER — Ambulatory Visit
Admission: RE | Admit: 2018-05-13 | Discharge: 2018-05-13 | Disposition: A | Discharge status: Home / Self Care | Payer: Medicare Other | Source: Ambulatory Visit | Attending: Radiation Oncology | Admitting: Radiation Oncology

## 2018-05-13 DIAGNOSIS — Z510 Encounter for antineoplastic radiation therapy: Secondary | ICD-10-CM | POA: Diagnosis not present

## 2018-05-14 ENCOUNTER — Ambulatory Visit
Admission: RE | Admit: 2018-05-14 | Discharge: 2018-05-14 | Disposition: A | Discharge status: Home / Self Care | Payer: Medicare Other | Source: Ambulatory Visit | Attending: Radiation Oncology | Admitting: Radiation Oncology

## 2018-05-14 DIAGNOSIS — Z510 Encounter for antineoplastic radiation therapy: Secondary | ICD-10-CM | POA: Diagnosis not present

## 2018-05-17 ENCOUNTER — Ambulatory Visit
Admission: RE | Admit: 2018-05-17 | Discharge: 2018-05-17 | Disposition: A | Discharge status: Home / Self Care | Payer: Medicare Other | Source: Ambulatory Visit | Attending: Radiation Oncology | Admitting: Radiation Oncology

## 2018-05-17 DIAGNOSIS — C3411 Malignant neoplasm of upper lobe, right bronchus or lung: Secondary | ICD-10-CM | POA: Insufficient documentation

## 2018-05-17 DIAGNOSIS — Z510 Encounter for antineoplastic radiation therapy: Secondary | ICD-10-CM | POA: Diagnosis not present

## 2018-05-18 ENCOUNTER — Ambulatory Visit
Admission: RE | Admit: 2018-05-18 | Discharge: 2018-05-18 | Disposition: A | Discharge status: Home / Self Care | Payer: Medicare Other | Source: Ambulatory Visit | Attending: Radiation Oncology | Admitting: Radiation Oncology

## 2018-05-18 DIAGNOSIS — C3411 Malignant neoplasm of upper lobe, right bronchus or lung: Secondary | ICD-10-CM | POA: Diagnosis not present

## 2018-05-19 ENCOUNTER — Ambulatory Visit
Admission: RE | Admit: 2018-05-19 | Discharge: 2018-05-19 | Disposition: A | Discharge status: Home / Self Care | Payer: Medicare Other | Source: Ambulatory Visit | Attending: Radiation Oncology | Admitting: Radiation Oncology

## 2018-05-19 DIAGNOSIS — C3411 Malignant neoplasm of upper lobe, right bronchus or lung: Secondary | ICD-10-CM | POA: Diagnosis not present

## 2018-05-21 ENCOUNTER — Ambulatory Visit
Admission: RE | Admit: 2018-05-21 | Discharge: 2018-05-21 | Disposition: A | Discharge status: Home / Self Care | Payer: Medicare Other | Source: Ambulatory Visit | Attending: Radiation Oncology | Admitting: Radiation Oncology

## 2018-05-21 DIAGNOSIS — C3411 Malignant neoplasm of upper lobe, right bronchus or lung: Secondary | ICD-10-CM | POA: Diagnosis not present

## 2018-05-24 ENCOUNTER — Ambulatory Visit
Admission: RE | Admit: 2018-05-24 | Discharge: 2018-05-24 | Disposition: A | Discharge status: Home / Self Care | Payer: Medicare Other | Source: Ambulatory Visit | Attending: Radiation Oncology | Admitting: Radiation Oncology

## 2018-05-24 DIAGNOSIS — C3411 Malignant neoplasm of upper lobe, right bronchus or lung: Secondary | ICD-10-CM | POA: Diagnosis not present

## 2018-05-25 ENCOUNTER — Ambulatory Visit
Admission: RE | Admit: 2018-05-25 | Discharge: 2018-05-25 | Disposition: A | Discharge status: Home / Self Care | Payer: Medicare Other | Source: Ambulatory Visit | Attending: Radiation Oncology | Admitting: Radiation Oncology

## 2018-05-25 DIAGNOSIS — C3411 Malignant neoplasm of upper lobe, right bronchus or lung: Secondary | ICD-10-CM | POA: Diagnosis not present

## 2018-05-26 ENCOUNTER — Inpatient Hospital Stay: Payer: Medicare Other | Attending: Oncology

## 2018-05-26 ENCOUNTER — Ambulatory Visit
Admission: RE | Admit: 2018-05-26 | Discharge: 2018-05-26 | Disposition: A | Discharge status: Home / Self Care | Payer: Medicare Other | Source: Ambulatory Visit | Attending: Radiation Oncology | Admitting: Radiation Oncology

## 2018-05-26 DIAGNOSIS — C50312 Malignant neoplasm of lower-inner quadrant of left female breast: Secondary | ICD-10-CM | POA: Diagnosis not present

## 2018-05-26 DIAGNOSIS — C3431 Malignant neoplasm of lower lobe, right bronchus or lung: Secondary | ICD-10-CM | POA: Diagnosis not present

## 2018-05-26 DIAGNOSIS — C3491 Malignant neoplasm of unspecified part of right bronchus or lung: Secondary | ICD-10-CM

## 2018-05-26 DIAGNOSIS — C3411 Malignant neoplasm of upper lobe, right bronchus or lung: Secondary | ICD-10-CM | POA: Diagnosis not present

## 2018-05-26 LAB — CBC
HCT: 43 % (ref 35.0–47.0)
HEMOGLOBIN: 14.6 g/dL (ref 12.0–16.0)
MCH: 31.8 pg (ref 26.0–34.0)
MCHC: 33.9 g/dL (ref 32.0–36.0)
MCV: 93.9 fL (ref 80.0–100.0)
Platelets: 238 10*3/uL (ref 150–440)
RBC: 4.58 MIL/uL (ref 3.80–5.20)
RDW: 14.4 % (ref 11.5–14.5)
WBC: 7.6 10*3/uL (ref 3.6–11.0)

## 2018-05-27 ENCOUNTER — Ambulatory Visit
Admission: RE | Admit: 2018-05-27 | Discharge: 2018-05-27 | Disposition: A | Discharge status: Home / Self Care | Payer: Medicare Other | Source: Ambulatory Visit | Attending: Radiation Oncology | Admitting: Radiation Oncology

## 2018-05-27 DIAGNOSIS — C3411 Malignant neoplasm of upper lobe, right bronchus or lung: Secondary | ICD-10-CM | POA: Diagnosis not present

## 2018-05-28 ENCOUNTER — Ambulatory Visit
Admission: RE | Admit: 2018-05-28 | Discharge: 2018-05-28 | Disposition: A | Discharge status: Home / Self Care | Payer: Medicare Other | Source: Ambulatory Visit | Attending: Radiation Oncology | Admitting: Radiation Oncology

## 2018-05-28 DIAGNOSIS — C3411 Malignant neoplasm of upper lobe, right bronchus or lung: Secondary | ICD-10-CM | POA: Diagnosis not present

## 2018-05-31 ENCOUNTER — Ambulatory Visit
Admission: RE | Admit: 2018-05-31 | Discharge: 2018-05-31 | Disposition: A | Discharge status: Home / Self Care | Payer: Medicare Other | Source: Ambulatory Visit | Attending: Radiation Oncology | Admitting: Radiation Oncology

## 2018-05-31 DIAGNOSIS — C3411 Malignant neoplasm of upper lobe, right bronchus or lung: Secondary | ICD-10-CM | POA: Diagnosis not present

## 2018-06-01 ENCOUNTER — Other Ambulatory Visit: Payer: Self-pay | Admitting: *Deleted

## 2018-06-01 ENCOUNTER — Ambulatory Visit
Admission: RE | Admit: 2018-06-01 | Discharge: 2018-06-01 | Disposition: A | Discharge status: Home / Self Care | Payer: Medicare Other | Source: Ambulatory Visit | Attending: Radiation Oncology | Admitting: Radiation Oncology

## 2018-06-01 DIAGNOSIS — C3411 Malignant neoplasm of upper lobe, right bronchus or lung: Secondary | ICD-10-CM | POA: Diagnosis not present

## 2018-06-01 MED ORDER — SUCRALFATE 1 G PO TABS: 1.0000 g | ORAL_TABLET | Freq: Three times a day (TID) | ORAL | 3 refills | Status: DC

## 2018-06-02 ENCOUNTER — Inpatient Hospital Stay: Payer: Medicare Other

## 2018-06-02 ENCOUNTER — Ambulatory Visit
Admission: RE | Admit: 2018-06-02 | Discharge: 2018-06-02 | Disposition: A | Discharge status: Home / Self Care | Payer: Medicare Other | Source: Ambulatory Visit | Attending: Radiation Oncology | Admitting: Radiation Oncology

## 2018-06-02 DIAGNOSIS — C3431 Malignant neoplasm of lower lobe, right bronchus or lung: Secondary | ICD-10-CM | POA: Diagnosis not present

## 2018-06-02 DIAGNOSIS — C3411 Malignant neoplasm of upper lobe, right bronchus or lung: Secondary | ICD-10-CM | POA: Diagnosis not present

## 2018-06-02 DIAGNOSIS — C3491 Malignant neoplasm of unspecified part of right bronchus or lung: Secondary | ICD-10-CM

## 2018-06-02 LAB — CBC
HCT: 40.9 % (ref 35.0–47.0)
Hemoglobin: 14 g/dL (ref 12.0–16.0)
MCH: 32.2 pg (ref 26.0–34.0)
MCHC: 34.3 g/dL (ref 32.0–36.0)
MCV: 93.9 fL (ref 80.0–100.0)
PLATELETS: 220 10*3/uL (ref 150–440)
RBC: 4.35 MIL/uL (ref 3.80–5.20)
RDW: 14.5 % (ref 11.5–14.5)
WBC: 7.2 10*3/uL (ref 3.6–11.0)

## 2018-06-03 ENCOUNTER — Ambulatory Visit
Admission: RE | Admit: 2018-06-03 | Discharge: 2018-06-03 | Disposition: A | Discharge status: Home / Self Care | Payer: Medicare Other | Source: Ambulatory Visit | Attending: Radiation Oncology | Admitting: Radiation Oncology

## 2018-06-03 DIAGNOSIS — C3411 Malignant neoplasm of upper lobe, right bronchus or lung: Secondary | ICD-10-CM | POA: Diagnosis not present

## 2018-06-04 ENCOUNTER — Ambulatory Visit
Admission: RE | Admit: 2018-06-04 | Discharge: 2018-06-04 | Disposition: A | Discharge status: Home / Self Care | Payer: Medicare Other | Source: Ambulatory Visit | Attending: Radiation Oncology | Admitting: Radiation Oncology

## 2018-06-04 DIAGNOSIS — C3411 Malignant neoplasm of upper lobe, right bronchus or lung: Secondary | ICD-10-CM | POA: Diagnosis not present

## 2018-06-07 ENCOUNTER — Ambulatory Visit
Admission: RE | Admit: 2018-06-07 | Discharge: 2018-06-07 | Disposition: A | Discharge status: Home / Self Care | Payer: Medicare Other | Source: Ambulatory Visit | Attending: Radiation Oncology | Admitting: Radiation Oncology

## 2018-06-07 DIAGNOSIS — C3411 Malignant neoplasm of upper lobe, right bronchus or lung: Secondary | ICD-10-CM | POA: Diagnosis not present

## 2018-06-08 ENCOUNTER — Ambulatory Visit
Admission: RE | Admit: 2018-06-08 | Discharge: 2018-06-08 | Disposition: A | Discharge status: Home / Self Care | Payer: Medicare Other | Source: Ambulatory Visit | Attending: Radiation Oncology | Admitting: Radiation Oncology

## 2018-06-08 DIAGNOSIS — C3411 Malignant neoplasm of upper lobe, right bronchus or lung: Secondary | ICD-10-CM | POA: Diagnosis not present

## 2018-06-09 ENCOUNTER — Other Ambulatory Visit: Payer: Self-pay

## 2018-06-09 ENCOUNTER — Ambulatory Visit
Admission: RE | Admit: 2018-06-09 | Discharge: 2018-06-09 | Disposition: A | Discharge status: Home / Self Care | Payer: Medicare Other | Source: Ambulatory Visit | Attending: Radiation Oncology | Admitting: Radiation Oncology

## 2018-06-09 ENCOUNTER — Inpatient Hospital Stay: Payer: Medicare Other

## 2018-06-09 DIAGNOSIS — C3411 Malignant neoplasm of upper lobe, right bronchus or lung: Secondary | ICD-10-CM | POA: Diagnosis not present

## 2018-06-09 DIAGNOSIS — C3491 Malignant neoplasm of unspecified part of right bronchus or lung: Secondary | ICD-10-CM

## 2018-06-09 DIAGNOSIS — C3431 Malignant neoplasm of lower lobe, right bronchus or lung: Secondary | ICD-10-CM | POA: Diagnosis not present

## 2018-06-09 LAB — CBC
HEMATOCRIT: 42.2 % (ref 35.0–47.0)
Hemoglobin: 14.2 g/dL (ref 12.0–16.0)
MCH: 31.7 pg (ref 26.0–34.0)
MCHC: 33.7 g/dL (ref 32.0–36.0)
MCV: 94.2 fL (ref 80.0–100.0)
PLATELETS: 243 10*3/uL (ref 150–440)
RBC: 4.48 MIL/uL (ref 3.80–5.20)
RDW: 14.6 % — AB (ref 11.5–14.5)
WBC: 6.7 10*3/uL (ref 3.6–11.0)

## 2018-06-10 ENCOUNTER — Ambulatory Visit
Admission: RE | Admit: 2018-06-10 | Discharge: 2018-06-10 | Disposition: A | Discharge status: Home / Self Care | Payer: Medicare Other | Source: Ambulatory Visit | Attending: Radiation Oncology | Admitting: Radiation Oncology

## 2018-06-10 DIAGNOSIS — C3411 Malignant neoplasm of upper lobe, right bronchus or lung: Secondary | ICD-10-CM | POA: Diagnosis not present

## 2018-06-11 ENCOUNTER — Ambulatory Visit
Admission: RE | Admit: 2018-06-11 | Discharge: 2018-06-11 | Disposition: A | Discharge status: Home / Self Care | Payer: Medicare Other | Source: Ambulatory Visit | Attending: Radiation Oncology | Admitting: Radiation Oncology

## 2018-06-11 DIAGNOSIS — C3411 Malignant neoplasm of upper lobe, right bronchus or lung: Secondary | ICD-10-CM | POA: Diagnosis not present

## 2018-06-14 ENCOUNTER — Ambulatory Visit
Admission: RE | Admit: 2018-06-14 | Discharge: 2018-06-14 | Disposition: A | Discharge status: Home / Self Care | Payer: Medicare Other | Source: Ambulatory Visit | Attending: Radiation Oncology | Admitting: Radiation Oncology

## 2018-06-14 DIAGNOSIS — C3411 Malignant neoplasm of upper lobe, right bronchus or lung: Secondary | ICD-10-CM | POA: Diagnosis not present

## 2018-06-15 ENCOUNTER — Ambulatory Visit
Admission: RE | Admit: 2018-06-15 | Discharge: 2018-06-15 | Disposition: A | Discharge status: Home / Self Care | Payer: Medicare Other | Source: Ambulatory Visit | Attending: Radiation Oncology | Admitting: Radiation Oncology

## 2018-06-15 DIAGNOSIS — C3411 Malignant neoplasm of upper lobe, right bronchus or lung: Secondary | ICD-10-CM | POA: Diagnosis not present

## 2018-06-16 ENCOUNTER — Ambulatory Visit: Payer: Medicare Other | Admitting: Radiation Oncology

## 2018-06-16 ENCOUNTER — Ambulatory Visit
Admission: RE | Admit: 2018-06-16 | Discharge: 2018-06-16 | Disposition: A | Discharge status: Home / Self Care | Payer: Medicare Other | Source: Ambulatory Visit | Attending: Radiation Oncology | Admitting: Radiation Oncology

## 2018-06-16 ENCOUNTER — Other Ambulatory Visit: Payer: Self-pay

## 2018-06-16 ENCOUNTER — Inpatient Hospital Stay: Payer: Medicare Other

## 2018-06-16 DIAGNOSIS — C3431 Malignant neoplasm of lower lobe, right bronchus or lung: Secondary | ICD-10-CM | POA: Diagnosis not present

## 2018-06-16 DIAGNOSIS — C3411 Malignant neoplasm of upper lobe, right bronchus or lung: Secondary | ICD-10-CM

## 2018-06-16 DIAGNOSIS — C3491 Malignant neoplasm of unspecified part of right bronchus or lung: Secondary | ICD-10-CM

## 2018-06-16 LAB — BASIC METABOLIC PANEL
Anion gap: 9 (ref 5–15)
BUN: 31 mg/dL — AB (ref 8–23)
CALCIUM: 9.5 mg/dL (ref 8.9–10.3)
CO2: 26 mmol/L (ref 22–32)
Chloride: 104 mmol/L (ref 98–111)
Creatinine, Ser: 1.41 mg/dL — ABNORMAL HIGH (ref 0.44–1.00)
GFR calc Af Amer: 37 mL/min — ABNORMAL LOW (ref >60)
GFR, EST NON AFRICAN AMERICAN: 32 mL/min — AB (ref >60)
GLUCOSE: 105 mg/dL — AB (ref 70–99)
Potassium: 5.5 mmol/L — ABNORMAL HIGH (ref 3.5–5.1)
Sodium: 139 mmol/L (ref 135–145)

## 2018-06-16 LAB — CBC
HEMATOCRIT: 41.9 % (ref 35.0–47.0)
Hemoglobin: 14 g/dL (ref 12.0–16.0)
MCH: 31.6 pg (ref 26.0–34.0)
MCHC: 33.4 g/dL (ref 32.0–36.0)
MCV: 94.6 fL (ref 80.0–100.0)
Platelets: 249 10*3/uL (ref 150–440)
RBC: 4.43 MIL/uL (ref 3.80–5.20)
RDW: 14.3 % (ref 11.5–14.5)
WBC: 7.3 10*3/uL (ref 3.6–11.0)

## 2018-06-17 ENCOUNTER — Ambulatory Visit
Admission: RE | Admit: 2018-06-17 | Discharge: 2018-06-17 | Disposition: A | Discharge status: Home / Self Care | Payer: Medicare Other | Source: Ambulatory Visit | Attending: Radiation Oncology | Admitting: Radiation Oncology

## 2018-06-17 DIAGNOSIS — C3411 Malignant neoplasm of upper lobe, right bronchus or lung: Secondary | ICD-10-CM | POA: Insufficient documentation

## 2018-06-17 DIAGNOSIS — Z510 Encounter for antineoplastic radiation therapy: Secondary | ICD-10-CM | POA: Insufficient documentation

## 2018-06-18 ENCOUNTER — Ambulatory Visit
Admission: RE | Admit: 2018-06-18 | Discharge: 2018-06-18 | Disposition: A | Discharge status: Home / Self Care | Payer: Medicare Other | Source: Ambulatory Visit | Attending: Radiation Oncology | Admitting: Radiation Oncology

## 2018-06-18 DIAGNOSIS — C3411 Malignant neoplasm of upper lobe, right bronchus or lung: Secondary | ICD-10-CM | POA: Diagnosis not present

## 2018-06-21 ENCOUNTER — Ambulatory Visit
Admission: RE | Admit: 2018-06-21 | Discharge: 2018-06-21 | Disposition: A | Discharge status: Home / Self Care | Payer: Medicare Other | Source: Ambulatory Visit | Attending: Radiation Oncology | Admitting: Radiation Oncology

## 2018-06-21 DIAGNOSIS — C3411 Malignant neoplasm of upper lobe, right bronchus or lung: Secondary | ICD-10-CM | POA: Diagnosis not present

## 2018-06-22 ENCOUNTER — Ambulatory Visit
Admission: RE | Admit: 2018-06-22 | Discharge: 2018-06-22 | Disposition: A | Discharge status: Home / Self Care | Payer: Medicare Other | Source: Ambulatory Visit | Attending: Radiation Oncology | Admitting: Radiation Oncology

## 2018-06-22 DIAGNOSIS — C3411 Malignant neoplasm of upper lobe, right bronchus or lung: Secondary | ICD-10-CM | POA: Diagnosis not present

## 2018-06-23 ENCOUNTER — Ambulatory Visit
Admission: RE | Admit: 2018-06-23 | Discharge: 2018-06-23 | Disposition: A | Discharge status: Home / Self Care | Payer: Medicare Other | Source: Ambulatory Visit | Attending: Radiation Oncology | Admitting: Radiation Oncology

## 2018-06-23 ENCOUNTER — Inpatient Hospital Stay: Payer: Medicare Other | Attending: Oncology

## 2018-06-23 DIAGNOSIS — C3431 Malignant neoplasm of lower lobe, right bronchus or lung: Secondary | ICD-10-CM | POA: Diagnosis present

## 2018-06-23 DIAGNOSIS — R1310 Dysphagia, unspecified: Secondary | ICD-10-CM | POA: Diagnosis present

## 2018-06-23 DIAGNOSIS — C3411 Malignant neoplasm of upper lobe, right bronchus or lung: Secondary | ICD-10-CM | POA: Diagnosis not present

## 2018-06-23 DIAGNOSIS — C3491 Malignant neoplasm of unspecified part of right bronchus or lung: Secondary | ICD-10-CM

## 2018-06-23 LAB — CBC
HEMATOCRIT: 41.7 % (ref 35.0–47.0)
Hemoglobin: 14.1 g/dL (ref 12.0–16.0)
MCH: 32 pg (ref 26.0–34.0)
MCHC: 33.9 g/dL (ref 32.0–36.0)
MCV: 94.4 fL (ref 80.0–100.0)
PLATELETS: 223 10*3/uL (ref 150–440)
RBC: 4.41 MIL/uL (ref 3.80–5.20)
RDW: 14.3 % (ref 11.5–14.5)
WBC: 6.9 10*3/uL (ref 3.6–11.0)

## 2018-06-24 ENCOUNTER — Ambulatory Visit
Admission: RE | Admit: 2018-06-24 | Discharge: 2018-06-24 | Disposition: A | Discharge status: Home / Self Care | Payer: Medicare Other | Source: Ambulatory Visit | Attending: Radiation Oncology | Admitting: Radiation Oncology

## 2018-06-24 DIAGNOSIS — C3411 Malignant neoplasm of upper lobe, right bronchus or lung: Secondary | ICD-10-CM | POA: Diagnosis not present

## 2018-06-25 ENCOUNTER — Ambulatory Visit
Admission: RE | Admit: 2018-06-25 | Discharge: 2018-06-25 | Disposition: A | Discharge status: Home / Self Care | Payer: Medicare Other | Source: Ambulatory Visit | Attending: Radiation Oncology | Admitting: Radiation Oncology

## 2018-06-25 DIAGNOSIS — C3411 Malignant neoplasm of upper lobe, right bronchus or lung: Secondary | ICD-10-CM | POA: Diagnosis not present

## 2018-06-28 ENCOUNTER — Ambulatory Visit
Admission: RE | Admit: 2018-06-28 | Discharge: 2018-06-28 | Disposition: A | Discharge status: Home / Self Care | Payer: Medicare Other | Source: Ambulatory Visit | Attending: Radiation Oncology | Admitting: Radiation Oncology

## 2018-06-28 DIAGNOSIS — C3411 Malignant neoplasm of upper lobe, right bronchus or lung: Secondary | ICD-10-CM | POA: Diagnosis not present

## 2018-06-29 ENCOUNTER — Ambulatory Visit
Admission: RE | Admit: 2018-06-29 | Discharge: 2018-06-29 | Disposition: A | Discharge status: Home / Self Care | Payer: Medicare Other | Source: Ambulatory Visit | Attending: Radiation Oncology | Admitting: Radiation Oncology

## 2018-06-29 DIAGNOSIS — C3411 Malignant neoplasm of upper lobe, right bronchus or lung: Secondary | ICD-10-CM | POA: Diagnosis not present

## 2018-07-14 ENCOUNTER — Telehealth: Payer: Self-pay | Admitting: *Deleted

## 2018-07-14 DIAGNOSIS — Z5189 Encounter for other specified aftercare: Secondary | ICD-10-CM

## 2018-07-14 NOTE — Telephone Encounter (Signed)
Daughter called and states since completing the radiation therapy the patient is having painful swallowing and difficulty eating food (she is drinking liquids fine) she is belching a lot but is uncomfortable. Appointment given to see NP she asked for tomorrow and accepted appointment at 21 AM for lab then see NP

## 2018-07-15 ENCOUNTER — Encounter: Payer: Self-pay | Admitting: Nurse Practitioner

## 2018-07-15 ENCOUNTER — Inpatient Hospital Stay: Payer: Medicare Other

## 2018-07-15 ENCOUNTER — Inpatient Hospital Stay (HOSPITAL_BASED_OUTPATIENT_CLINIC_OR_DEPARTMENT_OTHER): Payer: Medicare Other | Admitting: Nurse Practitioner

## 2018-07-15 VITALS — BP 126/76 | HR 76 | Temp 96.9°F | Resp 16 | Wt 113.7 lb

## 2018-07-15 DIAGNOSIS — F17210 Nicotine dependence, cigarettes, uncomplicated: Secondary | ICD-10-CM | POA: Diagnosis not present

## 2018-07-15 DIAGNOSIS — K219 Gastro-esophageal reflux disease without esophagitis: Secondary | ICD-10-CM

## 2018-07-15 DIAGNOSIS — R1310 Dysphagia, unspecified: Secondary | ICD-10-CM

## 2018-07-15 DIAGNOSIS — Z853 Personal history of malignant neoplasm of breast: Secondary | ICD-10-CM

## 2018-07-15 DIAGNOSIS — C50312 Malignant neoplasm of lower-inner quadrant of left female breast: Secondary | ICD-10-CM

## 2018-07-15 DIAGNOSIS — R4702 Dysphasia: Secondary | ICD-10-CM

## 2018-07-15 DIAGNOSIS — C3431 Malignant neoplasm of lower lobe, right bronchus or lung: Secondary | ICD-10-CM

## 2018-07-15 DIAGNOSIS — Z923 Personal history of irradiation: Secondary | ICD-10-CM

## 2018-07-15 DIAGNOSIS — C3411 Malignant neoplasm of upper lobe, right bronchus or lung: Secondary | ICD-10-CM

## 2018-07-15 DIAGNOSIS — Z5189 Encounter for other specified aftercare: Secondary | ICD-10-CM

## 2018-07-15 DIAGNOSIS — E860 Dehydration: Secondary | ICD-10-CM

## 2018-07-15 LAB — COMPREHENSIVE METABOLIC PANEL
ALBUMIN: 3.6 g/dL (ref 3.5–5.0)
ALT: 14 U/L (ref 0–44)
AST: 20 U/L (ref 15–41)
Alkaline Phosphatase: 74 U/L (ref 38–126)
Anion gap: 4 — ABNORMAL LOW (ref 5–15)
BUN: 34 mg/dL — ABNORMAL HIGH (ref 8–23)
CHLORIDE: 105 mmol/L (ref 98–111)
CO2: 28 mmol/L (ref 22–32)
Calcium: 8.7 mg/dL — ABNORMAL LOW (ref 8.9–10.3)
Creatinine, Ser: 1.23 mg/dL — ABNORMAL HIGH (ref 0.44–1.00)
GFR calc Af Amer: 44 mL/min — ABNORMAL LOW (ref >60)
GFR calc non Af Amer: 38 mL/min — ABNORMAL LOW (ref >60)
GLUCOSE: 103 mg/dL — AB (ref 70–99)
Potassium: 5.2 mmol/L — ABNORMAL HIGH (ref 3.5–5.1)
SODIUM: 137 mmol/L (ref 135–145)
Total Bilirubin: 0.5 mg/dL (ref 0.3–1.2)
Total Protein: 7.1 g/dL (ref 6.5–8.1)

## 2018-07-15 LAB — CBC WITH DIFFERENTIAL/PLATELET
BASOS ABS: 0.1 10*3/uL (ref 0–0.1)
BASOS PCT: 1 %
Eosinophils Absolute: 0.1 10*3/uL (ref 0–0.7)
Eosinophils Relative: 2 %
HCT: 40.8 % (ref 35.0–47.0)
Hemoglobin: 13.8 g/dL (ref 12.0–16.0)
Lymphocytes Relative: 8 %
Lymphs Abs: 0.7 10*3/uL — ABNORMAL LOW (ref 1.0–3.6)
MCH: 32 pg (ref 26.0–34.0)
MCHC: 33.8 g/dL (ref 32.0–36.0)
MCV: 94.6 fL (ref 80.0–100.0)
MONO ABS: 0.7 10*3/uL (ref 0.2–0.9)
Monocytes Relative: 8 %
Neutro Abs: 7.2 10*3/uL — ABNORMAL HIGH (ref 1.4–6.5)
Neutrophils Relative %: 81 %
Platelets: 300 10*3/uL (ref 150–440)
RBC: 4.31 MIL/uL (ref 3.80–5.20)
RDW: 14.2 % (ref 11.5–14.5)
WBC: 8.8 10*3/uL (ref 3.6–11.0)

## 2018-07-15 MED ORDER — FAMOTIDINE IN NACL 20-0.9 MG/50ML-% IV SOLN
20.0000 mg | Freq: Once | INTRAVENOUS | Status: AC
  Administered 2018-07-15: 20 mg via INTRAVENOUS
  Filled 2018-07-15: qty 50

## 2018-07-15 MED ORDER — SODIUM CHLORIDE 0.9 % IV SOLN
Freq: Once | INTRAVENOUS | Status: AC
  Administered 2018-07-15: 10:00:00 via INTRAVENOUS
  Filled 2018-07-15: qty 250

## 2018-07-15 MED ORDER — PANTOPRAZOLE SODIUM 20 MG PO TBEC: 20.0000 mg | DELAYED_RELEASE_TABLET | Freq: Every day | ORAL | 1 refills | Status: DC

## 2018-07-15 NOTE — Progress Notes (Signed)
[facility] [place]  Telephone:([phone] Fax:(336) [phone]  Patient Care Team: Ezequiel Kayser, MD as PCP - General (Internal Medicine) Lloyd Huger, MD as Medical Oncologist (Medical Oncology)   Name of the patient: [name] [name]  [id number]  [birth year]   Date of visit: 07/15/18  Diagnosis- Breast cancer & Lung Cancer  Chief complaint/ Reason for visit- Difficulty Swallowing  Heme/Onc history:  84 year old female who previously received XRT to both breasts and as well as SBRT to right lower lobe for nsclc. She had radiation in 2014 to a right upper lobe adenocarcinoma. She also completed SB RT to right lower lobe for nsclc. She had whole breast radiation in 2014 for er positive invasive mammary carcinoma. Pet on 04/02/18 showed hypermetabolic subcarinal adenopathy compatible with residual malignancy. She has irregular nodule on right lung base not appreciably hypermetabolic. She completed radiation 06/29/18.   Interval history- Taneil Lazarus Kropf presents with dysphagia. She noticed symptoms starting approximately 2-4 weeks ago and reports they occur intermittently. She feels 'choked' and 'stuck food'. She has noticed increased increased belching and hiccups which occur several times a day. She has been drinking water to help pass stuck food which resolves symptoms. She has altered her diet to eat soft foods which has resulted in symptoms occurring less frequently. She was previously taking sucralfate per Dr. Baruch Gouty but felt symptoms have persisted. Today she denies chest pain, sob, or burning of chest/throat. She denies emesis but has felt urge to regurgitate. She reports globus sensation.   She has a history of dilation approximately 4-6 years ago. She is unsure who the physician or practice was that performed this and today, requests to see Dr. Vicente Males if referral to GI is needed. She is accompanied by her daughter today who  contributes to history.   ECOG FS:1 - Symptomatic but completely ambulatory  Review of systems- Review of Systems  Constitutional: Negative for chills, fever, malaise/fatigue and weight loss.  HENT: Negative for congestion, ear discharge, ear pain, sinus pain, sore throat and tinnitus.   Eyes: Negative.   Respiratory: Negative.  Negative for cough, sputum production and shortness of breath.   Cardiovascular: Negative for chest pain (dysphagia), palpitations, orthopnea, claudication and leg swelling.  Gastrointestinal: Positive for heartburn. Negative for abdominal pain (epigastric intermittently), blood in stool, constipation, diarrhea, nausea and vomiting.  Genitourinary: Negative.   Musculoskeletal: Negative.   Skin: Negative.   Neurological: Negative for dizziness, tingling, weakness and headaches.  Endo/Heme/Allergies: Negative.   Psychiatric/Behavioral: Negative.      Current treatment- completed radiation on 06/29/18  Allergies  Allergen Reactions  . Bactrim [Sulfamethoxazole-Trimethoprim] Other (See Comments)    Reduced kidney function.  Per Dr. Holley Raring should not take  . Ibuprofen     Reduced kidney function. Per Dr. Holley Raring should not take  . Macrobid [employer] Macro] Other (See Comments)    Reduced Kidney function.  Should no longer take per Dr. Holley Raring  . Tape Rash    Past Medical History:  Diagnosis Date  . Breast cancer ([place]) 08/19/2012   LT LUMPECTOMY, radiation tx.   Marland Kitchen COPD (chronic obstructive pulmonary disease) ([place])   . Hailey-hailey disease   . Hyperlipemia   . Hypertension   . Lung cancer ([place]) 2014   RT LUNG, radiation tx  . Migraine   . Myocardial infarction ([place])   . Osteoporosis   . Personal history of radiation therapy 2013   left breast ca  . Personal history of  radiation therapy 2014   lung ca  . Radiation 2013   FOR BREAST CA  . Radiation 2014   FOR LUNG CA  . Renal artery stenosis [facility])     Past Surgical History:    Procedure Laterality Date  . ABDOMINAL HYSTERECTOMY    . APPENDECTOMY    . BREAST BIOPSY Left 08/03/2012   invasive mammary carcinoma, u/s guided bx  . 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 cardiac stents    . PARATHYROIDECTOMY    . TONSILLECTOMY      Social History   Socioeconomic History  . Marital status: Widowed    Spouse name: Not on file  . Number of children: Not on file  . Years of education: Not on file  . Highest education level: Not on file  Occupational History  . Not on file  Social Needs  . Financial resource strain: Not on file  . Food insecurity:    Worry: Not on file    Inability: Not on file  . Transportation needs:    Medical: Not on file    Non-medical: Not on file  Tobacco Use  . Smoking status: Current Every Day Smoker    Packs/day: 1.00    Years: 70.00    Pack years: 70.00  . Smokeless tobacco: Never Used  Substance and Sexual Activity  . Alcohol use: No  . Drug use: No  . Sexual activity: Never  Lifestyle  . Physical activity:    Days per week: Not on file    Minutes per session: Not on file  . Stress: Not on file  Relationships  . Social connections:    Talks on phone: Not on file    Gets together: Not on file    Attends religious service: Not on file    Active member of club or organization: Not on file    Attends meetings of clubs or organizations: Not on file    Relationship status: Not on file  . Intimate partner violence:    Fear of current or ex partner: Not on file    Emotionally abused: Not on file    Physically abused: Not on file    Forced sexual activity: Not on file  Other Topics Concern  . Not on file  Social History Narrative  . Not on file    Family History  Problem Relation Age of Onset  . Breast cancer Sister 26     Current Outpatient Medications:  .  albuterol (PROAIR HFA) 108 (90 Base) MCG/ACT inhaler, , Disp: , Rfl:  .  amLODipine (NORVASC)  2.5 MG tablet, Take 2.5 mg by mouth daily., Disp: , Rfl:  .  aspirin 325 MG tablet, Take 325 mg by mouth daily., Disp: , Rfl:  .  calcipotriene-betamethasone (TACLONEX) ointment, , Disp: , Rfl:  .  Calcium Carbonate-Vitamin D (CALCIUM-VITAMIN D) 500-200 MG-UNIT per tablet, Take 1 tablet by mouth daily., Disp: , Rfl:  .  fluticasone (FLONASE) 50 MCG/ACT nasal spray, Place 2 sprays into both nostrils daily., Disp: , Rfl:  .  ketotifen (ZADITOR) 0.025 % ophthalmic solution, 1 drop 2 (two) times daily., Disp: , Rfl:  .  letrozole (FEMARA) 2.5 MG tablet, TAKE 1 TABLET DAILY, Disp: 90 tablet, Rfl: 2 .  lisinopril (PRINIVIL,ZESTRIL) 20 MG tablet, Take 20 mg by mouth daily., Disp: , Rfl:  .  metoprolol succinate (TOPROL-XL) 50 MG 24 hr tablet, Take 25 mg by mouth daily.  Take with or immediately following a meal., Disp: , Rfl:  .  Multiple Vitamin (MULTIVITAMIN) tablet, Take 1 tablet by mouth daily., Disp: , Rfl:  .  simvastatin (ZOCOR) 40 MG tablet, Take 40 mg by mouth daily., Disp: , Rfl:  .  traMADol (ULTRAM) 50 MG tablet, Take by mouth every 6 (six) hours as needed., Disp: , Rfl:  .  sucralfate (CARAFATE) 1 g tablet, Take 1 tablet (1 g total) by mouth 3 (three) times daily. Dissolve in 3-4 tbsp warm water, swish and swallow. (Patient not taking: Reported on 07/15/2018), Disp: 90 tablet, Rfl: 3  Physical exam:  Vitals:   07/15/18 0904  BP: 126/76  Pulse: 76  Resp: 16  Temp: (!) 96.9 F (36.1 C)  TempSrc: Tympanic  Weight: 113 lb 11.2 oz (51.6 kg)   Physical Exam  Constitutional: She is oriented to person, place, and time.  Elderly female, thin build. No acute distress. accompanied  HENT:  Head: Atraumatic.  Nose: Nose normal.  Mouth/Throat: Oropharynx is clear and moist. No oropharyngeal exudate.  Eyes: Conjunctivae are normal. No scleral icterus.  Neck: Normal range of motion. Neck supple.  No coughing, belching, hiccups observed. Managing secretions.   Cardiovascular: Normal rate,  regular rhythm and normal heart sounds.  Pulmonary/Chest: Effort normal and breath sounds normal.  Abdominal: Soft. Bowel sounds are normal. She exhibits no distension. There is no tenderness.  Musculoskeletal: She exhibits no edema.  Neurological: She is alert and oriented to person, place, and time.  Skin: Skin is warm and dry.  Psychiatric: She has a normal mood and affect.     CMP Latest Ref Rng & Units 07/15/2018  Glucose 70 - 99 mg/dL 103(H)  BUN 8 - 23 mg/dL 34(H)  Creatinine 0.44 - 1.00 mg/dL 1.23(H)  Sodium 135 - 145 mmol/L 137  Potassium 3.5 - 5.1 mmol/L 5.2(H)  Chloride 98 - 111 mmol/L 105  CO2 22 - 32 mmol/L 28  Calcium 8.9 - 10.3 mg/dL 8.7(L)  Total Protein 6.5 - 8.1 g/dL 7.1  Total Bilirubin 0.3 - 1.2 mg/dL 0.5  Alkaline Phos 38 - 126 U/L 74  AST 15 - 41 U/L 20  ALT 0 - 44 U/L 14   CBC Latest Ref Rng & Units 07/15/2018  WBC 3.6 - 11.0 K/uL 8.8  Hemoglobin 12.0 - 16.0 g/dL 13.8  Hematocrit 35.0 - 47.0 % 40.8  Platelets 150 - 440 K/uL 300   No images are attached to the encounter.  No results found.  Assessment and plan- Patient is a 84 y.o. female diagnosed with lung cancer who presents to [facility] for dysphagia.   1. 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 Declined systemic chemotherapy. PET 03/2018 reviewed showed hyperdense and hypermetabolic subcarinal adenopathy. Nodule at right lung base not appreciably hypermetabolic. Saw Dr. Baruch Gouty in consult on 04/22/18 with plan for radiation to mediastinal node.   2. 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 Last mammogram 07/2017 bi-rads category 2 with no evidence of malignancy.   2. Dysphagia- GERD-like symptoms- may be inflammatory given recent radiation but hx of prior dilation. IV Fluids and pepcid in  clinic today and start on protonix. Will refer to GI for further evaluation and consideration of endoscopy and/or dilation. Patient requests to see Dr. Vicente Males.  Referral to Dr. Vicente Males. Follow up with Dr. Grayland Ormond as scheduled. Patient advised to notify the clinic if there is no improvement in symptoms or if symptoms worsen.   Case and findings discussed with Dr. Grayland Ormond who agrees with plan of care and management.   Visit Diagnosis 1. Primary cancer of right upper lobe of lung ([place])   2. Dysphagia, unspecified type    Patient expressed understanding and was in agreement with this plan. She also understands that She can call clinic at any time with any questions, concerns, or complaints.   Thank you for allowing me to participate in the care of this very pleasant patient.   Beckey Rutter, DNP, AGNP-C [place] at [facility] [phone] (work cell) [phone] (office)

## 2018-07-20 ENCOUNTER — Ambulatory Visit (INDEPENDENT_AMBULATORY_CARE_PROVIDER_SITE_OTHER): Payer: Medicare Other | Admitting: Vascular Surgery

## 2018-07-21 ENCOUNTER — Telehealth: Payer: Self-pay | Admitting: Nurse Practitioner

## 2018-07-21 NOTE — Telephone Encounter (Signed)
Called patient to follow up after recent Symptom Management visit. She was contacted by Dr. Georgeann Oppenheim office to schedule appointment and was told they could not see her until October. Per inbasket, Dr. Vicente Males had felt he could see her sooner; Ginger was copied on this message. Her daughter, Gerald Stabs, will contact Dr. Georgeann Oppenheim clinic to follow-up.

## 2018-07-23 ENCOUNTER — Telehealth: Payer: Self-pay

## 2018-07-23 NOTE — Telephone Encounter (Signed)
-----   Message from Jonathon Bellows, MD sent at 07/22/2018 10:50 AM EDT ----- Regarding: FW: Referral to Dr. Tyrell Antonio  Offer her an appointment next week to see me at the office   Kiran  ----- Message ----- From: Verlon Au, NP Sent: 07/15/2018  10:06 AM EDT To: Glennie [place], CMA, Jonathon Bellows, MD Subject: Referral to Dr. Leretha Pol Ginger,   I have a patient that I saw at the [facility] who needs an endoscopy and possible dilation (hx of dilation ~ 4-6 years ago at unknown practice or provider). She is requesting to see Dr. Vicente Males. Dr. Janese Banks said I could message you to see if you might be able to expedite the referral? I'm working on note now and can copy you and Dr. Vicente Males on that note. Daughter, Bonnita Nasuti (goes by Gerald Stabs) is primary contact and does transportation for patient. Thanks so much!   Beckey Rutter, NP [phone]

## 2018-07-23 NOTE — Telephone Encounter (Signed)
Called pt to offer her an appointment for next week. Pt states she will have her daughter call our office to schedule appointment as it depends on her work schedule.

## 2018-07-29 ENCOUNTER — Encounter: Payer: Self-pay | Admitting: Radiation Oncology

## 2018-07-29 ENCOUNTER — Ambulatory Visit
Admission: RE | Admit: 2018-07-29 | Discharge: 2018-07-29 | Disposition: A | Discharge status: Home / Self Care | Payer: Medicare Other | Source: Ambulatory Visit | Attending: Radiation Oncology | Admitting: Radiation Oncology

## 2018-07-29 ENCOUNTER — Other Ambulatory Visit: Payer: Self-pay

## 2018-07-29 ENCOUNTER — Other Ambulatory Visit: Payer: Self-pay | Admitting: *Deleted

## 2018-07-29 VITALS — BP 108/70 | HR 90 | Temp 98.3°F | Resp 16 | Wt 111.6 lb

## 2018-07-29 DIAGNOSIS — C3431 Malignant neoplasm of lower lobe, right bronchus or lung: Secondary | ICD-10-CM | POA: Diagnosis present

## 2018-07-29 DIAGNOSIS — Z923 Personal history of irradiation: Secondary | ICD-10-CM | POA: Diagnosis not present

## 2018-07-29 NOTE — Progress Notes (Signed)
Radiation Oncology Follow up Note  Name: [name] [name] [facility]   Date:   07/29/2018 MRN:  [id number] DOB: [birth year]    This 84 y.o. female presents to the clinic today for one-month follow-up.status post radiation therapy to her mediastinal nodes  REFERRING PROVIDER: Ezequiel Kayser, MD  HPI: patient is a.81-year-old female previously treated to both breasts as well as SB RT to a right lower lobe for non-small cell lung cancer. She recently had a PET CT scan showing hypermetabolic activity in subcarinal lymph node compatible with residual malignancy and is just completed a course of external beam treatment to that area she is doing well. She specifically denies dysphagiahemoptysis or chest tightness she does have a nonproductive cough.  COMPLICATIONS OF TREATMENT: none  FOLLOW UP COMPLIANCE: keeps appointments   PHYSICAL EXAM:  BP 108/70 (BP Location: Left Arm, Patient Position: Sitting)   Pulse 90   Temp 98.3 F (36.8 C) (Tympanic)   Resp 16   Wt 111 lb 8.8 oz (50.6 kg)   BMI 19.76 kg/m  hin frail-appearing female in NADWell-developed well-nourished patient in NAD. HEENT reveals PERLA, EOMI, discs not visualized.  Oral cavity is clear. No oral mucosal lesions are identified. Neck is clear without evidence of cervical or supraclavicular adenopathy. Lungs are clear to A&P. Cardiac examination is essentially unremarkable with regular rate and rhythm without murmur rub or thrill. Abdomen is benign with no organomegaly or masses noted. Motor sensory and DTR levels are equal and symmetric in the upper and lower extremities. Cranial nerves II through XII are grossly intact. Proprioception is intact. No peripheral adenopathy or edema is identified. No motor or sensory levels are noted. Crude visual fields are within normal range.  RADIOLOGY RESULTS: no current films for review  PLAN: present time she is recovering nicely from her radiation therapy treatments. I'm please were overall progress.  I've asked to see her back in 3 months for follow-up and will obtain a CT scan of her chest with contrast prior to that visit. Patient knows to call with any concerns at any time. She continues close follow-up care with medical oncology.  I would like to take this opportunity to thank you for allowing me to participate in the care of your patient.Noreene Filbert, MD

## 2018-07-30 ENCOUNTER — Ambulatory Visit: Payer: Medicare Other | Admitting: Radiation Oncology

## 2018-08-01 NOTE — Progress Notes (Deleted)
[place]  Telephone:(336) [phone] Fax:(336) [phone]  ID: [name] [name] [name] [name] OB: 02-28-1928  MR#: [id number]  UXN#:[id number]  Patient Care Team: [name] Kayser, MD as PCP - General (Internal Medicine) [name] Huger, MD as Medical Oncologist (Medical Oncology)  CHIEF COMPLAINT: Stage Ia ER positive, PR/HER-2 negative adenocarcinoma of the inner lower quadrant of the left breast, now stage Ia adenocarcinoma of the right upper lobe lung.  INTERVAL HISTORY: Patient returns to clinic today for further evaluation and discussion of her imaging results.  She continues to feel well and remains asymptomatic.  She is tolerating letrozole without significant side effects.  She has no neurologic complaints.  She denies any recent fevers or illnesses.  She denies any chest pain, shortness of breath, cough, or hemoptysis. She denies any nausea, vomiting, constipation, or diarrhea. She has no urinary complaints.  Patient offers no specific complaints today.  REVIEW OF SYSTEMS:   Review of Systems  Constitutional: Negative.  Negative for fever, malaise/fatigue and weight loss.  Respiratory: Negative.  Negative for cough and shortness of breath.   Cardiovascular: Negative.  Negative for chest pain and leg swelling.  Gastrointestinal: Negative.  Negative for abdominal pain, blood in stool, constipation, diarrhea, nausea and vomiting.  Genitourinary: Negative.   Musculoskeletal: Negative.   Skin: Negative.  Negative for rash.  Neurological: Negative.  Negative for tingling, sensory change, focal weakness, weakness and headaches.  Psychiatric/Behavioral: Negative.  The patient is not nervous/anxious and does not have insomnia.     As per HPI. Otherwise, a complete review of systems is negative.  PAST MEDICAL HISTORY: Past Medical History:  Diagnosis Date  . Breast cancer ([place]) 08/19/2012   LT LUMPECTOMY, radiation tx.   Marland Kitchen COPD (chronic obstructive pulmonary disease) ([place])    . [name] [name] disease   . Hyperlipemia   . Hypertension   . Lung cancer ([place]) 2014   RT LUNG, radiation tx  . Migraine   . Myocardial infarction ([place])   . Osteoporosis   . Personal history of radiation therapy 2013   left breast ca  . Personal history of radiation therapy 2014   lung ca  . Radiation 2013   FOR BREAST CA  . Radiation 2014   FOR LUNG CA  . Renal artery stenosis ([place])     PAST SURGICAL HISTORY: Past Surgical History:  Procedure Laterality Date  . ABDOMINAL HYSTERECTOMY    . APPENDECTOMY    . BREAST BIOPSY Left 08/03/2012   invasive mammary carcinoma, u/s guided bx  . 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 cardiac stents    . PARATHYROIDECTOMY    . TONSILLECTOMY      FAMILY HISTORY Family History  Problem Relation Age of Onset  . Breast cancer Sister 84       ADVANCED DIRECTIVES:    HEALTH MAINTENANCE: Social History   Tobacco Use  . Smoking status: Current Every Day Smoker    Packs/day: 1.00    Years: 70.00    Pack years: 70.00  . Smokeless tobacco: Never Used  Substance Use Topics  . Alcohol use: No  . Drug use: No      Allergies  Allergen Reactions  . Bactrim [Sulfamethoxazole-Trimethoprim] Other (See Comments)    Reduced kidney function.  Per Dr. Holley [name] should not take  . Ibuprofen     Reduced kidney function. Per Dr. Holley [name] should not take  . Macrobid [employer] Macro] Other (See Comments)  Reduced Kidney function.  Should no longer take per Dr. Holley [name]  . Tape Rash    Current Outpatient Medications  Medication Sig Dispense Refill  . albuterol (PROAIR HFA) 108 (90 Base) MCG/ACT inhaler     . amLODipine (NORVASC) 2.5 MG tablet Take 2.5 mg by mouth daily.    Marland Kitchen aspirin 325 MG tablet Take 325 mg by mouth daily.    . calcipotriene-betamethasone (TACLONEX) ointment     . Calcium Carbonate-Vitamin D (CALCIUM-VITAMIN D) 500-200 MG-UNIT per tablet Take 1  tablet by mouth daily.    . fluticasone (FLONASE) 50 MCG/ACT nasal spray Place 2 sprays into both nostrils daily.    Marland Kitchen ketotifen (ZADITOR) 0.025 % ophthalmic solution 1 drop 2 (two) times daily.    Marland Kitchen letrozole (FEMARA) 2.5 MG tablet TAKE 1 TABLET DAILY 90 tablet 2  . lisinopril (PRINIVIL,ZESTRIL) 20 MG tablet Take 20 mg by mouth daily.    . metoprolol succinate (TOPROL-XL) 50 MG 24 hr tablet Take 25 mg by mouth daily. Take with or immediately following a meal.    . Multiple Vitamin (MULTIVITAMIN) tablet Take 1 tablet by mouth daily.    . pantoprazole (PROTONIX) 20 MG tablet Take 1 tablet (20 mg total) by mouth daily. 30 tablet 1  . simvastatin (ZOCOR) 40 MG tablet Take 40 mg by mouth daily.    . sucralfate (CARAFATE) 1 g tablet Take 1 tablet (1 g total) by mouth 3 (three) times daily. Dissolve in 3-4 tbsp warm water, swish and swallow. (Patient not taking: Reported on 07/15/2018) 90 tablet 3  . traMADol (ULTRAM) 50 MG tablet Take by mouth every 6 (six) hours as needed.     No current facility-administered medications for this visit.     OBJECTIVE: There were no vitals filed for this visit.   There is no height or weight on file to calculate BMI.    ECOG FS:0 - Asymptomatic  General: Well-developed, well-nourished, no acute distress. Eyes: Pink conjunctiva, anicteric sclera. Lungs: Clear to auscultation bilaterally. Heart: Regular rate and rhythm. No rubs, murmurs, or gallops. Abdomen: Soft, nontender, nondistended. No organomegaly noted, normoactive bowel sounds. Musculoskeletal: No edema, cyanosis, or clubbing. Neuro: Alert, answering all questions appropriately. Cranial nerves grossly intact. Skin: No rashes or petechiae noted. Psych: Normal affect.  LAB RESULTS:  Lab Results  Component Value Date   NA 137 07/15/2018   K 5.2 (H) 07/15/2018   CL 105 07/15/2018   CO2 28 07/15/2018   GLUCOSE 103 (H) 07/15/2018   BUN 34 (H) 07/15/2018   CREATININE 1.23 (H) 07/15/2018   CALCIUM 8.7  (L) 07/15/2018   PROT 7.1 07/15/2018   ALBUMIN 3.6 07/15/2018   AST 20 07/15/2018   ALT 14 07/15/2018   ALKPHOS 74 07/15/2018   BILITOT 0.5 07/15/2018   GFRNONAA 38 (L) 07/15/2018   GFRAA 44 (L) 07/15/2018    Lab Results  Component Value Date   WBC 8.8 07/15/2018   NEUTROABS 7.2 (H) 07/15/2018   HGB 13.8 07/15/2018   HCT 40.8 07/15/2018   MCV 94.6 07/15/2018   PLT 300 07/15/2018     STUDIES: No results found.  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  PLAN:    1. Stage Ia right lower lobe lung carcinoma: Patient completed XRT on September 08, 2016.  PET scan results reviewed independently report as  above with possible recurrence and subcarinal node.  Case was discussed at cancer conference as well.  Patient is stated she does not wish to have systemic chemotherapy, but would undergo XRT if possible.  A referral has been sent to radiation oncology.  Return to clinic 3 months after the conclusion of her XRT with repeat imaging and further evaluation.   2. 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  Oncotype testing was not performed as this would not change the treatment plan.  Continue letrozole until August 2019 completing 5 years of treatment.  Her most recent mammogram on July 29, 2017 was reported as BI-RADS 2.  Repeat in September 2019. 3. Osteoporosis: Patient's most recent bone mineral density on July 29, 2017 and was reported as -2.7 which is improved over one year prior.  Proceed with Prolia today.  Continue calcium and vitamin D supplementation.  Return to clinic as above with repeat laboratory work and continuation of Prolia.  Repeat  bone mineral density in September 2019.   4.  Stage Ia adenocarcinoma of the right upper lobe lung: Patient previously elected to proceed with XRT only. No intervention is needed at this time.  No evidence of local recurrence.  PET scan as above.  Approximately 30 minutes was spent in discussion of which greater than 50% was consultation.  Patient expressed understanding and was in agreement with this plan. She also understands that She can call clinic at any time with any questions, concerns, or complaints.    [name] Huger, MD 08/01/18 4:06 PM

## 2018-08-04 ENCOUNTER — Other Ambulatory Visit: Payer: Medicare Other

## 2018-08-04 ENCOUNTER — Ambulatory Visit: Payer: Medicare Other | Admitting: Oncology

## 2018-08-04 ENCOUNTER — Ambulatory Visit: Payer: Medicare Other

## 2018-08-06 ENCOUNTER — Inpatient Hospital Stay: Payer: Medicare Other

## 2018-08-06 ENCOUNTER — Inpatient Hospital Stay: Payer: Medicare Other | Admitting: Oncology

## 2018-08-26 ENCOUNTER — Ambulatory Visit: Payer: Medicare Other | Admitting: Gastroenterology

## 2018-09-08 ENCOUNTER — Other Ambulatory Visit: Payer: Self-pay | Admitting: *Deleted

## 2018-09-08 DIAGNOSIS — C3490 Malignant neoplasm of unspecified part of unspecified bronchus or lung: Secondary | ICD-10-CM

## 2018-09-13 NOTE — Progress Notes (Signed)
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  INTERVAL HISTORY: Patient returns to clinic today for further evaluation.  She recent completed XRT for suspected recurrence of her lung cancer in her hilar region.  She tolerated her treatment well.  She currently feels well and remains asymptomatic.  She is tolerating letrozole without significant side effects. She has no neurologic complaints.  She denies any recent fevers or illnesses.  She denies any chest pain, shortness of breath, cough, or hemoptysis. She denies any nausea, vomiting, constipation, or diarrhea. She has no urinary complaints.  Patient feels at her baseline offers no specific complaints today.  REVIEW OF SYSTEMS:   Review of Systems  Constitutional: Negative.  Negative for fever, malaise/fatigue and weight loss.  Respiratory: Negative.  Negative for cough and shortness of breath.   Cardiovascular: Negative.  Negative for chest pain and leg swelling.  Gastrointestinal: Negative.  Negative for abdominal pain, blood in stool, constipation, diarrhea, nausea and vomiting.  Genitourinary: Negative.   Musculoskeletal: Negative.   Skin: Negative.  Negative for rash.  Neurological: Negative.  Negative for tingling, sensory change, focal weakness, weakness and headaches.  Psychiatric/Behavioral: Negative.  The patient is not nervous/anxious and does not have insomnia.     As per HPI. Otherwise, a complete review of systems is negative.  PAST MEDICAL HISTORY: Past Medical History:  Diagnosis Date  . Breast  cancer ([place]) 08/19/2012   LT LUMPECTOMY, radiation tx.   Marland Kitchen COPD (chronic obstructive pulmonary disease) ([place])   . Hailey-hailey disease   . Hyperlipemia   . Hypertension   . Lung cancer ([place]) 2014   RT LUNG, radiation tx  . Migraine   . Myocardial infarction ([place])   . Osteoporosis   . Personal history of radiation therapy 2013   left breast ca  . Personal history of radiation therapy 2014   lung ca  . Radiation 2013   FOR BREAST CA  . Radiation 2014   FOR LUNG CA  . Renal artery stenosis ([place])     PAST SURGICAL HISTORY: Past Surgical History:  Procedure Laterality Date  . ABDOMINAL HYSTERECTOMY    . APPENDECTOMY    . BREAST BIOPSY Left 08/03/2012   invasive mammary carcinoma, u/s guided bx  . 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 cardiac stents    . PARATHYROIDECTOMY    . TONSILLECTOMY      FAMILY HISTORY Family History  Problem Relation Age of Onset  . Breast cancer Sister 74       ADVANCED DIRECTIVES:    HEALTH MAINTENANCE: Social History   Tobacco Use  . Smoking status: Current Every Day Smoker    Packs/day: 1.00    Years: 70.00    Pack years: 70.00  . Smokeless tobacco: Never Used  Substance Use Topics  . Alcohol use: No  . Drug use: No      Allergies  Allergen Reactions  . Bactrim [Sulfamethoxazole-Trimethoprim] Other (See Comments)    Reduced kidney function.  Per Dr. Holley Raring should not take  . Ibuprofen     Reduced kidney  function. Per Dr. Holley Raring should not take  . Macrobid [employer] Macro] Other (See Comments)    Reduced Kidney function.  Should no longer take per Dr. Holley Raring  . Tape Rash    Current Outpatient Medications  Medication Sig Dispense Refill  . albuterol (PROAIR HFA) 108 (90 Base) MCG/ACT inhaler     . amLODipine (NORVASC) 2.5 MG tablet Take 2.5 mg by mouth daily.    Marland Kitchen aspirin 325 MG tablet Take 325 mg by mouth daily.    .  calcipotriene-betamethasone (TACLONEX) ointment     . Calcium Carbonate-Vitamin D (CALCIUM-VITAMIN D) 500-200 MG-UNIT per tablet Take 1 tablet by mouth daily.    . fluticasone (FLONASE) 50 MCG/ACT nasal spray Place 2 sprays into both nostrils daily.    Marland Kitchen ketotifen (ZADITOR) 0.025 % ophthalmic solution 1 drop 2 (two) times daily.    Marland Kitchen letrozole (FEMARA) 2.5 MG tablet TAKE 1 TABLET DAILY 90 tablet 2  . lisinopril (PRINIVIL,ZESTRIL) 20 MG tablet Take 20 mg by mouth daily.    . metoprolol succinate (TOPROL-XL) 50 MG 24 hr tablet Take 25 mg by mouth daily. Take with or immediately following a meal.    . Multiple Vitamin (MULTIVITAMIN) tablet Take 1 tablet by mouth daily.    . pantoprazole (PROTONIX) 20 MG tablet Take 1 tablet (20 mg total) by mouth daily. 30 tablet 1  . simvastatin (ZOCOR) 40 MG tablet Take 40 mg by mouth daily.    . traMADol (ULTRAM) 50 MG tablet Take by mouth every 6 (six) hours as needed.     No current facility-administered medications for this visit.     OBJECTIVE: Vitals:   09/17/18 0928  BP: (!) 153/91  Pulse: 82  Resp: 20  Temp: 98.4 F (36.9 C)     Body mass index is 19.84 kg/m.    ECOG FS:0 - Asymptomatic  General: Well-developed, well-nourished, no acute distress. Eyes: Pink conjunctiva, anicteric sclera. HEENT: Normocephalic, moist mucous membranes. Breast: Exam deferred today. Lungs: Clear to auscultation bilaterally. Heart: Regular rate and rhythm. No rubs, murmurs, or gallops. Abdomen: Soft, nontender, nondistended. No organomegaly noted, normoactive bowel sounds. Musculoskeletal: No edema, cyanosis, or clubbing. Neuro: Alert, answering all questions appropriately. Cranial nerves grossly intact. Skin: No rashes or petechiae noted. Psych: Normal affect.  LAB RESULTS:  Lab Results  Component Value Date   NA 134 (L) 09/17/2018   K 4.9 09/17/2018   CL 98 09/17/2018   CO2 26 09/17/2018   GLUCOSE 97 09/17/2018   BUN 38 (H) 09/17/2018   CREATININE  1.28 (H) 09/17/2018   CALCIUM 9.9 09/17/2018   PROT 8.0 09/17/2018   ALBUMIN 3.9 09/17/2018   AST 23 09/17/2018   ALT 15 09/17/2018   ALKPHOS 83 09/17/2018   BILITOT 0.5 09/17/2018   GFRNONAA 36 (L) 09/17/2018   GFRAA 42 (L) 09/17/2018    Lab Results  Component Value Date   WBC 7.7 09/17/2018   NEUTROABS 5.8 09/17/2018   HGB 14.0 09/17/2018   HCT 43.7 09/17/2018   MCV 95.4 09/17/2018   PLT 271 09/17/2018     STUDIES: No results found.  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  PLAN:    1. Stage Ia right lower lobe lung carcinoma: Noted on restaging PET scan on July 31, 2016.  Unclear if this was a second primary  lesion or a pulmonary metastasis from her right upper lobe lesion diagnosed in 2014. Patient completed XRT to this lesion on September 08, 2016.  PET scan results from Apr 02, 2018 revealed suspected recurrence in subcarinal lymph nodes.  She underwent XRT completing in August 2019.  Patient has restaging CT scan later this month with follow-up with radiation oncology. 2. 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  Oncotype testing was not performed as this would not change the treatment plan.  Patient has now completed 5 years of letrozole and was instructed to discontinue when she finishes her current prescription. Her most recent mammogram on July 29, 2017 was reported as BI-RADS 2.  Repeat mammogram in the next 1 to 2 weeks. 3. Osteoporosis: Patient's most recent bone mineral density on July 29, 2017 and was reported as -2.7 which is improved over one year prior.  Proceed with Prolia today.  Continue calcium and vitamin D  supplementation.  Repeat bone marrow density in the next 1 to 2 weeks. 4.  Stage Ia adenocarcinoma of the right upper lobe lung: Diagnosed on August 04, 2013.  Patient previously elected to proceed with XRT only.  PET scan results as above. 5.  Disposition: Patient has restaging CT scans scheduled on September 29, 2018 with follow-up with radiation oncology several weeks later.  We will schedule her six-month CT scan today in approximately May 2020.  Patient has her mammogram and bone marrow density scheduled for October 04, 2018.  Return to clinic in May 2020 after her CT scan for further evaluation and continuation of Prolia.  Patient expressed understanding and was in agreement with this plan. She also understands that She can call clinic at any time with any questions, concerns, or complaints.    Lloyd Huger, MD 09/18/18 8:39 AM

## 2018-09-17 ENCOUNTER — Inpatient Hospital Stay (HOSPITAL_BASED_OUTPATIENT_CLINIC_OR_DEPARTMENT_OTHER): Payer: Medicare Other | Admitting: Oncology

## 2018-09-17 ENCOUNTER — Inpatient Hospital Stay: Payer: Medicare Other

## 2018-09-17 ENCOUNTER — Encounter: Payer: Self-pay | Admitting: Oncology

## 2018-09-17 ENCOUNTER — Inpatient Hospital Stay: Payer: Medicare Other | Attending: Oncology

## 2018-09-17 VITALS — BP 153/91 | HR 82 | Temp 98.4°F | Resp 20 | Wt 112.0 lb

## 2018-09-17 DIAGNOSIS — C3411 Malignant neoplasm of upper lobe, right bronchus or lung: Secondary | ICD-10-CM

## 2018-09-17 DIAGNOSIS — C50912 Malignant neoplasm of unspecified site of left female breast: Secondary | ICD-10-CM | POA: Diagnosis present

## 2018-09-17 DIAGNOSIS — F17210 Nicotine dependence, cigarettes, uncomplicated: Secondary | ICD-10-CM | POA: Insufficient documentation

## 2018-09-17 DIAGNOSIS — C3490 Malignant neoplasm of unspecified part of unspecified bronchus or lung: Secondary | ICD-10-CM

## 2018-09-17 DIAGNOSIS — Z23 Encounter for immunization: Secondary | ICD-10-CM | POA: Diagnosis not present

## 2018-09-17 DIAGNOSIS — C50312 Malignant neoplasm of lower-inner quadrant of left female breast: Secondary | ICD-10-CM

## 2018-09-17 DIAGNOSIS — M810 Age-related osteoporosis without current pathological fracture: Secondary | ICD-10-CM

## 2018-09-17 DIAGNOSIS — Z299 Encounter for prophylactic measures, unspecified: Secondary | ICD-10-CM

## 2018-09-17 LAB — COMPREHENSIVE METABOLIC PANEL
ALT: 15 U/L (ref 0–44)
AST: 23 U/L (ref 15–41)
Albumin: 3.9 g/dL (ref 3.5–5.0)
Alkaline Phosphatase: 83 U/L (ref 38–126)
Anion gap: 10 (ref 5–15)
BUN: 38 mg/dL — ABNORMAL HIGH (ref 8–23)
CHLORIDE: 98 mmol/L (ref 98–111)
CO2: 26 mmol/L (ref 22–32)
Calcium: 9.9 mg/dL (ref 8.9–10.3)
Creatinine, Ser: 1.28 mg/dL — ABNORMAL HIGH (ref 0.44–1.00)
GFR, EST AFRICAN AMERICAN: 42 mL/min — AB (ref >60)
GFR, EST NON AFRICAN AMERICAN: 36 mL/min — AB (ref >60)
Glucose, Bld: 97 mg/dL (ref 70–99)
POTASSIUM: 4.9 mmol/L (ref 3.5–5.1)
SODIUM: 134 mmol/L — AB (ref 135–145)
Total Bilirubin: 0.5 mg/dL (ref 0.3–1.2)
Total Protein: 8 g/dL (ref 6.5–8.1)

## 2018-09-17 LAB — CBC WITH DIFFERENTIAL/PLATELET
ABS IMMATURE GRANULOCYTES: 0.03 10*3/uL (ref 0.00–0.07)
BASOS PCT: 1 %
Basophils Absolute: 0 10*3/uL (ref 0.0–0.1)
Eosinophils Absolute: 0.1 10*3/uL (ref 0.0–0.5)
Eosinophils Relative: 2 %
HCT: 43.7 % (ref 36.0–46.0)
Hemoglobin: 14 g/dL (ref 12.0–15.0)
Immature Granulocytes: 0 %
Lymphocytes Relative: 14 %
Lymphs Abs: 1 10*3/uL (ref 0.7–4.0)
MCH: 30.6 pg (ref 26.0–34.0)
MCHC: 32 g/dL (ref 30.0–36.0)
MCV: 95.4 fL (ref 80.0–100.0)
MONO ABS: 0.6 10*3/uL (ref 0.1–1.0)
Monocytes Relative: 8 %
NEUTROS ABS: 5.8 10*3/uL (ref 1.7–7.7)
NEUTROS PCT: 75 %
NRBC: 0 % (ref 0.0–0.2)
PLATELETS: 271 10*3/uL (ref 150–400)
RBC: 4.58 MIL/uL (ref 3.87–5.11)
RDW: 13.8 % (ref 11.5–15.5)
WBC: 7.7 10*3/uL (ref 4.0–10.5)

## 2018-09-17 MED ORDER — DENOSUMAB 60 MG/ML ~~LOC~~ SOSY
60.0000 mg | PREFILLED_SYRINGE | Freq: Once | SUBCUTANEOUS | Status: AC
  Administered 2018-09-17: 60 mg via SUBCUTANEOUS

## 2018-09-17 MED ORDER — INFLUENZA VAC SPLIT HIGH-DOSE 0.5 ML IM SUSY
0.5000 mL | PREFILLED_SYRINGE | Freq: Once | INTRAMUSCULAR | Status: AC
  Administered 2018-09-17: 0.5 mL via INTRAMUSCULAR

## 2018-09-17 NOTE — Progress Notes (Signed)
Patient here today for follow up regarding breast cancer, lung cancer. Patient reports worsening back pain.

## 2018-09-17 NOTE — Patient Instructions (Signed)
Denosumab injection What is this medicine? DENOSUMAB (den oh sue mab) slows bone breakdown. Prolia is used to treat osteoporosis in women after menopause and in men. Delton See is used to treat a high calcium level due to cancer and to prevent bone fractures and other bone problems caused by multiple myeloma or cancer bone metastases. Delton See is also used to treat giant cell tumor of the bone. This medicine may be used for other purposes; ask your health care provider or pharmacist if you have questions. COMMON BRAND NAME(S): Prolia, XGEVA What should I tell my health care provider before I take this medicine? They need to know if you have any of these conditions: -dental disease -having surgery or tooth extraction -infection -kidney disease -low levels of calcium or Vitamin D in the blood -malnutrition -on hemodialysis -skin conditions or sensitivity -thyroid or parathyroid disease -an unusual reaction to denosumab, other medicines, foods, dyes, or preservatives -pregnant or trying to get pregnant -breast-feeding How should I use this medicine? This medicine is for injection under the skin. It is given by a health care professional in a hospital or clinic setting. If you are getting Prolia, a special MedGuide will be given to you by the pharmacist with each prescription and refill. Be sure to read this information carefully each time. For Prolia, talk to your pediatrician regarding the use of this medicine in children. Special care may be needed. For Delton See, talk to your pediatrician regarding the use of this medicine in children. While this drug may be prescribed for children as young as 13 years for selected conditions, precautions do apply. Overdosage: If you think you have taken too much of this medicine contact a poison control center or emergency room at once. NOTE: This medicine is only for you. Do not share this medicine with others. What if I miss a dose? It is important not to miss your  dose. Call your doctor or health care professional if you are unable to keep an appointment. What may interact with this medicine? Do not take this medicine with any of the following medications: -other medicines containing denosumab This medicine may also interact with the following medications: -medicines that lower your chance of fighting infection -steroid medicines like prednisone or cortisone This list may not describe all possible interactions. Give your health care provider a list of all the medicines, herbs, non-prescription drugs, or dietary supplements you use. Also tell them if you smoke, drink alcohol, or use illegal drugs. Some items may interact with your medicine. What should I watch for while using this medicine? Visit your doctor or health care professional for regular checks on your progress. Your doctor or health care professional may order blood tests and other tests to see how you are doing. Call your doctor or health care professional for advice if you get a fever, chills or sore throat, or other symptoms of a cold or flu. Do not treat yourself. This drug may decrease your body's ability to fight infection. Try to avoid being around people who are sick. You should make sure you get enough calcium and vitamin D while you are taking this medicine, unless your doctor tells you not to. Discuss the foods you eat and the vitamins you take with your health care professional. See your dentist regularly. Brush and floss your teeth as directed. Before you have any dental work done, tell your dentist you are receiving this medicine. Do not become pregnant while taking this medicine or for 5 months after stopping  it. Talk with your doctor or health care professional about your birth control options while taking this medicine. Women should inform their doctor if they wish to become pregnant or think they might be pregnant. There is a potential for serious side effects to an unborn child. Talk  to your health care professional or pharmacist for more information. What side effects may I notice from receiving this medicine? Side effects that you should report to your doctor or health care professional as soon as possible: -allergic reactions like skin rash, itching or hives, swelling of the face, lips, or tongue -bone pain -breathing problems -dizziness -jaw pain, especially after dental work -redness, blistering, peeling of the skin -signs and symptoms of infection like fever or chills; cough; sore throat; pain or trouble passing urine -signs of low calcium like fast heartbeat, muscle cramps or muscle pain; pain, tingling, numbness in the hands or feet; seizures -unusual bleeding or bruising -unusually weak or tired Side effects that usually do not require medical attention (report to your doctor or health care professional if they continue or are bothersome): -constipation -diarrhea -headache -joint pain -loss of appetite -muscle pain -runny nose -tiredness -upset stomach This list may not describe all possible side effects. Call your doctor for medical advice about side effects. You may report side effects to FDA at 1-800-FDA-1088. Where should I keep my medicine? This medicine is only given in a clinic, doctor's office, or other health care setting and will not be stored at home. NOTE: This sheet is a summary. It may not cover all possible information. If you have questions about this medicine, talk to your doctor, pharmacist, or health care provider.  2018 Elsevier/Gold Standard (2016-11-25 19:17:21) Influenza Virus Vaccine (Flucelvax) What is this medicine? INFLUENZA VIRUS VACCINE (in floo EN zuh VAHY ruhs vak SEEN) helps to reduce the risk of getting influenza also known as the flu. The vaccine only helps protect you against some strains of the flu. This medicine may be used for other purposes; ask your health care provider or pharmacist if you have questions. COMMON  BRAND NAME(S): FLUCELVAX What should I tell my health care provider before I take this medicine? They need to know if you have any of these conditions: -bleeding disorder like hemophilia -fever or infection -Guillain-Barre syndrome or other neurological problems -immune system problems -infection with the human immunodeficiency virus (HIV) or AIDS -low blood platelet counts -multiple sclerosis -an unusual or allergic reaction to influenza virus vaccine, other medicines, foods, dyes or preservatives -pregnant or trying to get pregnant -breast-feeding How should I use this medicine? This vaccine is for injection into a muscle. It is given by a health care professional. A copy of Vaccine Information Statements will be given before each vaccination. Read this sheet carefully each time. The sheet may change frequently. Talk to your pediatrician regarding the use of this medicine in children. Special care may be needed. Overdosage: If you think you've taken too much of this medicine contact a poison control center or emergency room at once. Overdosage: If you think you have taken too much of this medicine contact a poison control center or emergency room at once. NOTE: This medicine is only for you. Do not share this medicine with others. What if I miss a dose? This does not apply. What may interact with this medicine? -chemotherapy or radiation therapy -medicines that lower your immune system like etanercept, anakinra, infliximab, and adalimumab -medicines that treat or prevent blood clots like warfarin -phenytoin -steroid medicines  like prednisone or cortisone -theophylline -vaccines This list may not describe all possible interactions. Give your health care provider a list of all the medicines, herbs, non-prescription drugs, or dietary supplements you use. Also tell them if you smoke, drink alcohol, or use illegal drugs. Some items may interact with your medicine. What should I watch for  while using this medicine? Report any side effects that do not go away within 3 days to your doctor or health care professional. Call your health care provider if any unusual symptoms occur within 6 weeks of receiving this vaccine. You may still catch the flu, but the illness is not usually as bad. You cannot get the flu from the vaccine. The vaccine will not protect against colds or other illnesses that may cause fever. The vaccine is needed every year. What side effects may I notice from receiving this medicine? Side effects that you should report to your doctor or health care professional as soon as possible: -allergic reactions like skin rash, itching or hives, swelling of the face, lips, or tongue Side effects that usually do not require medical attention (Report these to your doctor or health care professional if they continue or are bothersome.): -fever -headache -muscle aches and pains -pain, tenderness, redness, or swelling at the injection site -tiredness This list may not describe all possible side effects. Call your doctor for medical advice about side effects. You may report side effects to FDA at 1-800-FDA-1088. Where should I keep my medicine? The vaccine will be given by a health care professional in a clinic, pharmacy, doctor's office, or other health care setting. You will not be given vaccine doses to store at home. NOTE: This sheet is a summary. It may not cover all possible information. If you have questions about this medicine, talk to your doctor, pharmacist, or health care provider.  2018 Elsevier/Gold Standard (2011-10-15 14:06:47) Preventing Influenza, Adult Influenza, more commonly known as "the flu," is a viral infection that mainly affects the respiratory tract. The respiratory tract includes structures that help you breathe, such as the lungs, nose, and throat. The flu causes many common cold symptoms, as well as a high fever and body aches. The flu spreads easily  from person to person (is contagious). The flu is most common from December through March. This is called flu season.You can catch the flu virus by:  Breathing in droplets from an infected person's cough or sneeze.  Touching something that was recently contaminated with the virus and then touching your mouth, nose, or eyes.  What can I do to lower my risk? You can decrease your risk of getting the flu by:  Getting a flu shot (influenza vaccination) every year. This is the best way to prevent the flu. A flu shot is recommended for everyone age 84 months and older. ? It is best to get a flu shot in the fall, as soon as it is available. Getting a flu shot during winter or spring instead is still a good idea. Flu season can last into early spring. ? Preventing the flu through vaccination requires getting a new flu shot every year. This is because the flu virus changes slightly (mutates) from one year to the next. Even if a flu shot does not completely protect you from all flu virus mutations, it can reduce the severity of your illness and prevent dangerous complications of the flu. ? If you are pregnant, you can and should get a flu shot. ? If you have had a  reaction to the shot in the past or if you are allergic to eggs, check with your health care provider before getting a flu shot. ? Sometimes the vaccine is available as a nasal spray. In some years, the nasal spray has not been as effective against the flu virus. Check with your health care provider if you have questions about this.  Practicing good health habits. This is especially important during flu season. ? Avoid contact with people who are sick with flu or cold symptoms. ? Wash your hands with soap and water often. If soap and water are not available, use hand sanitizer. ? Avoid touching your hands to your face, especially when you have not washed your hands recently. ? Use a disinfectant to clean surfaces at home and at work that may be  contaminated with the flu virus. ? Keep your body's disease-fighting system (immune system) in good shape by eating a healthy diet, drinking plenty of fluids, getting enough sleep, and exercising regularly.  If you do get the flu, avoid spreading it to others by:  Staying home until your symptoms have been gone for at least one day.  Covering your mouth and nose with your elbow when you cough or sneeze.  Avoiding close contact with others, especially babies and elderly people.  Why are these changes important? Getting a flu shot and practicing good health habits protects you as well as other people. If you get the flu, your friends, family, and co-workers are also at risk of getting it, because it spreads so easily to others. Each year, about 2 out of every 10 people get the flu. Having the flu can lead to complications, such as pneumonia, ear infection, and sinus infection. The flu also can be deadly, especially for babies, people older than age 76, and people who have serious long-term diseases. How is this treated? Most people recover from the flu by resting at home and drinking plenty of fluids. However, a prescription antiviral medicine may reduce your flu symptoms and may make your flu go away sooner. This medicine must be started within a few days of getting flu symptoms. You can talk with your health care provider about whether you need an antiviral medicine. Antiviral medicine may be prescribed for people who are at risk for more serious flu symptoms. This includes people who:  Are older than age 18.  Are pregnant.  Have a condition that makes the flu worse or more dangerous.  Where to find more information:  Centers for Disease Control and Prevention: http://www.smith-bell.org/  [employer].ee: azureicus.com  American Academy of Family Physicians: familydoctor.org/familydoctor/en/kids/vaccines/preventing-the-flu.html Contact a health care provider if:  You have  influenza and you develop new symptoms.  You have: ? Chest pain. ? Diarrhea. ? A fever.  Your cough gets worse, or you produce more mucus. Summary  The best way to prevent the flu is to get a flu shot every year in the fall.  Even if you get the flu after you have received the yearly vaccine, your flu may be milder and go away sooner because of your flu shot.  If you get the flu, antiviral medicines that are started with a few days of symptoms may reduce your flu symptoms and may make your flu go away sooner.  You can also help prevent the flu by practicing good health habits. This information is not intended to replace advice given to you by your health care provider. Make sure you discuss any questions you have with your  health care provider. Document Released: 11/18/2015 Document Revised: 07/12/2016 Document Reviewed: 07/12/2016 Elsevier Interactive Patient Education  [employer].

## 2018-09-29 ENCOUNTER — Ambulatory Visit
Admission: RE | Admit: 2018-09-29 | Discharge: 2018-09-29 | Disposition: A | Discharge status: Home / Self Care | Payer: Medicare Other | Source: Ambulatory Visit | Attending: Radiation Oncology | Admitting: Radiation Oncology

## 2018-09-29 DIAGNOSIS — R918 Other nonspecific abnormal finding of lung field: Secondary | ICD-10-CM | POA: Insufficient documentation

## 2018-09-29 DIAGNOSIS — I2510 Atherosclerotic heart disease of native coronary artery without angina pectoris: Secondary | ICD-10-CM | POA: Diagnosis not present

## 2018-09-29 DIAGNOSIS — I700 Atherosclerosis of aorta: Secondary | ICD-10-CM | POA: Insufficient documentation

## 2018-09-29 DIAGNOSIS — C3431 Malignant neoplasm of lower lobe, right bronchus or lung: Secondary | ICD-10-CM | POA: Insufficient documentation

## 2018-09-29 MED ORDER — IOHEXOL 300 MG/ML  SOLN
75.0000 mL | Freq: Once | INTRAMUSCULAR | Status: AC | PRN
  Administered 2018-09-29: 50 mL via INTRAVENOUS

## 2018-10-04 ENCOUNTER — Ambulatory Visit
Admission: RE | Admit: 2018-10-04 | Discharge: 2018-10-04 | Disposition: A | Discharge status: Home / Self Care | Payer: Medicare Other | Source: Ambulatory Visit | Attending: Oncology | Admitting: Oncology

## 2018-10-04 DIAGNOSIS — C3411 Malignant neoplasm of upper lobe, right bronchus or lung: Secondary | ICD-10-CM | POA: Diagnosis present

## 2018-10-04 DIAGNOSIS — Z853 Personal history of malignant neoplasm of breast: Secondary | ICD-10-CM | POA: Diagnosis not present

## 2018-10-04 DIAGNOSIS — Z1231 Encounter for screening mammogram for malignant neoplasm of breast: Secondary | ICD-10-CM | POA: Diagnosis not present

## 2018-10-04 DIAGNOSIS — R928 Other abnormal and inconclusive findings on diagnostic imaging of breast: Secondary | ICD-10-CM | POA: Diagnosis not present

## 2018-10-04 DIAGNOSIS — C50312 Malignant neoplasm of lower-inner quadrant of left female breast: Secondary | ICD-10-CM

## 2018-10-04 DIAGNOSIS — Z780 Asymptomatic menopausal state: Secondary | ICD-10-CM | POA: Insufficient documentation

## 2018-10-04 DIAGNOSIS — M810 Age-related osteoporosis without current pathological fracture: Secondary | ICD-10-CM | POA: Diagnosis not present

## 2018-10-05 ENCOUNTER — Other Ambulatory Visit: Payer: Self-pay | Admitting: Oncology

## 2018-10-05 DIAGNOSIS — N6310 Unspecified lump in the right breast, unspecified quadrant: Secondary | ICD-10-CM

## 2018-10-05 DIAGNOSIS — R928 Other abnormal and inconclusive findings on diagnostic imaging of breast: Secondary | ICD-10-CM

## 2018-10-11 ENCOUNTER — Ambulatory Visit: Payer: Medicare Other

## 2018-10-20 ENCOUNTER — Ambulatory Visit
Admission: RE | Admit: 2018-10-20 | Discharge: 2018-10-20 | Disposition: A | Discharge status: Home / Self Care | Payer: Medicare Other | Source: Ambulatory Visit | Attending: Radiation Oncology | Admitting: Radiation Oncology

## 2018-10-20 ENCOUNTER — Other Ambulatory Visit: Payer: Self-pay

## 2018-10-20 ENCOUNTER — Encounter: Payer: Self-pay | Admitting: Radiation Oncology

## 2018-10-20 VITALS — BP 130/74 | HR 91 | Temp 96.5°F | Resp 18 | Wt 112.4 lb

## 2018-10-20 DIAGNOSIS — Z853 Personal history of malignant neoplasm of breast: Secondary | ICD-10-CM | POA: Insufficient documentation

## 2018-10-20 DIAGNOSIS — C771 Secondary and unspecified malignant neoplasm of intrathoracic lymph nodes: Secondary | ICD-10-CM | POA: Insufficient documentation

## 2018-10-20 DIAGNOSIS — F17210 Nicotine dependence, cigarettes, uncomplicated: Secondary | ICD-10-CM | POA: Diagnosis not present

## 2018-10-20 DIAGNOSIS — C3431 Malignant neoplasm of lower lobe, right bronchus or lung: Secondary | ICD-10-CM | POA: Diagnosis not present

## 2018-10-20 DIAGNOSIS — Z923 Personal history of irradiation: Secondary | ICD-10-CM | POA: Diagnosis not present

## 2018-10-20 NOTE — Progress Notes (Signed)
Radiation Oncology Follow up Note  Name: [name] [name]   Date:   10/20/2018 MRN:  [id number] DOB: [birth year]    This 84 y.o. female presents to the clinic today for four-month follow-up status post.salvage radiation therapy to her chest for mediastinal progression of disease  REFERRING PROVIDER: Ezequiel Kayser, MD  HPI: patient is a 84 year old female previous treated to both breasts as well as SB RT to right lower lobe for non-small cell lung cancer. She had a PET scan for follow-up showing hypermetabolic activity in subcarinal lymph nodes compatible with residual malignancy. She underwent salvage radiation therapy and is now seen out 4 months. She is doing fairly well still has a slight nonproductive cough no chest tightness or hemoptysis.she recently had a CT scan in November showing the subcarinal nodes which were hypermetabolic on PET/CT stable in size from previous scan with no progressive adenopathy demonstrated she did have some ill-defined right lower lobe opacities likely inflammatory.  COMPLICATIONS OF TREATMENT: none  FOLLOW UP COMPLIANCE: keeps appointments   PHYSICAL EXAM:  BP 130/74 (BP Location: Left Arm, Patient Position: Sitting)   Pulse 91   Temp (!) 96.5 F (35.8 C) (Tympanic)   Resp 18   Wt 112 lb 7 oz (51 kg)   BMI 19.92 kg/m  Well-developed well-nourished patient in NAD. HEENT reveals PERLA, EOMI, discs not visualized.  Oral cavity is clear. No oral mucosal lesions are identified. Neck is clear without evidence of cervical or supraclavicular adenopathy. Lungs are clear to A&P. Cardiac examination is essentially unremarkable with regular rate and rhythm without murmur rub or thrill. Abdomen is benign with no organomegaly or masses noted. Motor sensory and DTR levels are equal and symmetric in the upper and lower extremities. Cranial nerves II through XII are grossly intact. Proprioception is intact. No peripheral adenopathy or edema is identified. No motor or  sensory levels are noted. Crude visual fields are within normal range.  RADIOLOGY RESULTS: CT scans reviewed and compatible with the above-stated findings  PLAN: present time patient is doing well she is stable. I'm please were overall progress.she is scheduled for follow-up CT scan in about 4 months. She continues close follow-up care with medical oncology. I have asked to see her back in 6 months for follow-up. Patient is to call sooner with any concerns.  I would like to take this opportunity to thank you for allowing me to participate in the care of your patient.Noreene Filbert, MD

## 2018-10-25 ENCOUNTER — Ambulatory Visit
Admission: RE | Admit: 2018-10-25 | Discharge: 2018-10-25 | Disposition: A | Discharge status: Home / Self Care | Payer: Medicare Other | Source: Ambulatory Visit | Attending: Oncology | Admitting: Oncology

## 2018-10-25 DIAGNOSIS — N6310 Unspecified lump in the right breast, unspecified quadrant: Secondary | ICD-10-CM | POA: Insufficient documentation

## 2018-10-25 DIAGNOSIS — R928 Other abnormal and inconclusive findings on diagnostic imaging of breast: Secondary | ICD-10-CM

## 2018-11-19 ENCOUNTER — Other Ambulatory Visit (INDEPENDENT_AMBULATORY_CARE_PROVIDER_SITE_OTHER): Payer: Self-pay | Admitting: Nephrology

## 2018-11-19 DIAGNOSIS — I701 Atherosclerosis of renal artery: Secondary | ICD-10-CM

## 2018-11-22 ENCOUNTER — Ambulatory Visit (INDEPENDENT_AMBULATORY_CARE_PROVIDER_SITE_OTHER): Payer: Medicare Other

## 2018-11-22 DIAGNOSIS — I701 Atherosclerosis of renal artery: Secondary | ICD-10-CM | POA: Diagnosis not present

## 2018-12-27 ENCOUNTER — Ambulatory Visit
Admission: RE | Admit: 2018-12-27 | Discharge: 2018-12-27 | Disposition: A | Discharge status: Home / Self Care | Payer: Medicare Other | Source: Ambulatory Visit | Attending: Nurse Practitioner | Admitting: Nurse Practitioner

## 2018-12-27 ENCOUNTER — Other Ambulatory Visit
Admission: RE | Admit: 2018-12-27 | Discharge: 2018-12-27 | Disposition: A | Discharge status: Home / Self Care | Payer: Medicare Other | Source: Ambulatory Visit | Attending: Nurse Practitioner | Admitting: Nurse Practitioner

## 2018-12-27 ENCOUNTER — Telehealth: Payer: Self-pay | Admitting: *Deleted

## 2018-12-27 ENCOUNTER — Inpatient Hospital Stay: Payer: Medicare Other | Attending: Nurse Practitioner

## 2018-12-27 ENCOUNTER — Inpatient Hospital Stay (HOSPITAL_BASED_OUTPATIENT_CLINIC_OR_DEPARTMENT_OTHER): Payer: Medicare Other | Admitting: Nurse Practitioner

## 2018-12-27 VITALS — BP 124/78 | HR 98 | Temp 97.8°F | Resp 18 | Wt 110.0 lb

## 2018-12-27 DIAGNOSIS — C3411 Malignant neoplasm of upper lobe, right bronchus or lung: Secondary | ICD-10-CM

## 2018-12-27 DIAGNOSIS — R0781 Pleurodynia: Secondary | ICD-10-CM | POA: Diagnosis not present

## 2018-12-27 DIAGNOSIS — C50312 Malignant neoplasm of lower-inner quadrant of left female breast: Secondary | ICD-10-CM | POA: Diagnosis not present

## 2018-12-27 DIAGNOSIS — N183 Chronic kidney disease, stage 3 unspecified: Secondary | ICD-10-CM

## 2018-12-27 DIAGNOSIS — I714 Abdominal aortic aneurysm, without rupture: Secondary | ICD-10-CM | POA: Insufficient documentation

## 2018-12-27 DIAGNOSIS — Z79899 Other long term (current) drug therapy: Secondary | ICD-10-CM | POA: Insufficient documentation

## 2018-12-27 DIAGNOSIS — F17210 Nicotine dependence, cigarettes, uncomplicated: Secondary | ICD-10-CM

## 2018-12-27 DIAGNOSIS — Z923 Personal history of irradiation: Secondary | ICD-10-CM | POA: Diagnosis not present

## 2018-12-27 DIAGNOSIS — R0789 Other chest pain: Secondary | ICD-10-CM

## 2018-12-27 DIAGNOSIS — Z170 Estrogen receptor positive status [ER+]: Secondary | ICD-10-CM | POA: Diagnosis not present

## 2018-12-27 DIAGNOSIS — N184 Chronic kidney disease, stage 4 (severe): Secondary | ICD-10-CM

## 2018-12-27 DIAGNOSIS — I129 Hypertensive chronic kidney disease with stage 1 through stage 4 chronic kidney disease, or unspecified chronic kidney disease: Secondary | ICD-10-CM | POA: Diagnosis not present

## 2018-12-27 DIAGNOSIS — Z853 Personal history of malignant neoplasm of breast: Secondary | ICD-10-CM | POA: Diagnosis not present

## 2018-12-27 DIAGNOSIS — I10 Essential (primary) hypertension: Secondary | ICD-10-CM | POA: Diagnosis not present

## 2018-12-27 DIAGNOSIS — Z7982 Long term (current) use of aspirin: Secondary | ICD-10-CM | POA: Diagnosis not present

## 2018-12-27 LAB — COMPREHENSIVE METABOLIC PANEL
ALT: 14 U/L (ref 0–44)
AST: 23 U/L (ref 15–41)
Albumin: 4.1 g/dL (ref 3.5–5.0)
Alkaline Phosphatase: 70 U/L (ref 38–126)
Anion gap: 8 (ref 5–15)
BUN: 38 mg/dL — ABNORMAL HIGH (ref 8–23)
CALCIUM: 9.6 mg/dL (ref 8.9–10.3)
CO2: 26 mmol/L (ref 22–32)
CREATININE: 1.18 mg/dL — AB (ref 0.44–1.00)
Chloride: 103 mmol/L (ref 98–111)
GFR calc Af Amer: 48 mL/min — ABNORMAL LOW (ref >60)
GFR calc non Af Amer: 41 mL/min — ABNORMAL LOW (ref >60)
Glucose, Bld: 125 mg/dL — ABNORMAL HIGH (ref 70–99)
Potassium: 4.4 mmol/L (ref 3.5–5.1)
Sodium: 137 mmol/L (ref 135–145)
Total Bilirubin: 0.6 mg/dL (ref 0.3–1.2)
Total Protein: 7.5 g/dL (ref 6.5–8.1)

## 2018-12-27 LAB — CBC WITH DIFFERENTIAL/PLATELET
Abs Immature Granulocytes: 0.04 10*3/uL (ref 0.00–0.07)
Basophils Absolute: 0.1 10*3/uL (ref 0.0–0.1)
Basophils Relative: 1 %
EOS ABS: 0.1 10*3/uL (ref 0.0–0.5)
Eosinophils Relative: 1 %
HCT: 45.4 % (ref 36.0–46.0)
Hemoglobin: 14.4 g/dL (ref 12.0–15.0)
Immature Granulocytes: 1 %
Lymphocytes Relative: 15 %
Lymphs Abs: 1.3 10*3/uL (ref 0.7–4.0)
MCH: 30.1 pg (ref 26.0–34.0)
MCHC: 31.7 g/dL (ref 30.0–36.0)
MCV: 95 fL (ref 80.0–100.0)
Monocytes Absolute: 0.8 10*3/uL (ref 0.1–1.0)
Monocytes Relative: 9 %
Neutro Abs: 6.2 10*3/uL (ref 1.7–7.7)
Neutrophils Relative %: 73 %
Platelets: 246 10*3/uL (ref 150–400)
RBC: 4.78 MIL/uL (ref 3.87–5.11)
RDW: 13.7 % (ref 11.5–15.5)
WBC: 8.4 10*3/uL (ref 4.0–10.5)
nRBC: 0 % (ref 0.0–0.2)

## 2018-12-27 MED ORDER — TRAMADOL HCL 50 MG PO TABS: 50.0000 mg | ORAL_TABLET | Freq: Four times a day (QID) | ORAL | 0 refills | Status: DC | PRN

## 2018-12-27 NOTE — Progress Notes (Addendum)
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  She subsequently underwent a wide local excision for a 1.2 cm invasive mammary carcinoma, well differentiated, ER positive, PR borderline, HER-2/neu negative.  One sentinel lymph node was negative for metastatic disease.  She received adjuvant radiation with Dr. Baruch Gouty followed by letrozole  Patient was lifelong smoker and was noted to have enlarging right upper lobe lung mass.  She underwent CT-guided biopsy that was positive for adenocarcinoma.  Based on comorbidities she was felt to be high risk for surgery and underwent radiation completed 2015.   PET scan on 07/31/2016 showed hypermetabolic irregular 1.0 cm right lower lobe pulmonary nodule, hypermetabolic right infrahilar lymph node consistent with ipsilateral hilar nodal metastases. She completed XRT 09/08/2016. Due to age and comorbidities, chemotherapy was not recommended. Surveillance imaging revealed possible recurrence and subcarinal node.  Case discussed at multidisciplinary case conference.  Patient declined systemic chemotherapy.  Elected for radiation which she completed 06/29/2018.  Imaging on 09/29/2018 revealed stable small subcarinal node, multifocal, ill-defined right lower lobe opacities likely inflammatory.  No new nodularity suspicious for metastatic  disease.  Stable post-treatment changes in right upper lobe.   Abdominal aortic aneurysm-infrarenal 3.6 cm abdominal aortic aneurysm incidentally seen on PET scan.  Recommendation per consensus guidelines to repeat ultrasound in/around 07/2018. Stable on 2019 lung cancer surveillance imaging.    No history exists.    Interval history- [name] [name], 85 year old female, with above history of lung and breast cancer, presents to symptom management clinic for right rib pain.  She states that she has had pain in similar area previously but felt over the weekend it was acutely worse.  She localizes pain to right back/flank/ribs it radiates up to right shoulder.  She took tramadol and Tylenol with some improvement.  She laid on heating pad which significantly improved pain. Pain is concerning to her as it is new. She denies trauma to the area. Denies falls. Says she fell against a cook stove as a child hitting that area but has never been painful as an adult. Denies chest pain, nausea, vomiting, constipation, or diarrhea. Denies urinary complaints. Denies fever or illness. Denies neurologic complaints.   ECOG FS:1 - Symptomatic but completely ambulatory  Review of systems- Review of Systems  Constitutional: Negative for chills, diaphoresis, fever, malaise/fatigue and weight loss.  HENT: Negative.  Negative for congestion, ear pain, sinus pain and sore throat.   Eyes: Negative.  Negative for blurred vision, double vision, photophobia, pain and redness.  Respiratory: Positive for cough (improved) and shortness of breath (with exertion- chronic and improved). Negative for hemoptysis, sputum production and wheezing.   Cardiovascular: Negative.  Negative for chest pain, palpitations, orthopnea, claudication, leg swelling and PND.  Gastrointestinal: Negative for abdominal pain, blood in stool, constipation, diarrhea, heartburn, melena, nausea and vomiting.  Genitourinary: Positive for flank pain (right  flank/rib per hpi). Negative for dysuria, frequency, hematuria and  urgency.  Musculoskeletal: Positive for back pain (per hpi). Negative for falls, joint pain, myalgias and neck pain.  Skin: Negative.  Negative for itching and rash.  Neurological: Negative for dizziness, loss of consciousness, weakness and headaches.  Psychiatric/Behavioral: Negative for depression. The patient is not nervous/anxious and does not have insomnia.      Current treatment- surveillance- radiation completed 06/29/2018  Allergies  Allergen Reactions  . Bactrim [Sulfamethoxazole-Trimethoprim] Other (See Comments)    Reduced kidney function.  Per Dr. Holley Raring should not take  . Ibuprofen     Reduced kidney function. Per Dr. Holley Raring should not take  . Macrobid [employer] Macro] Other (See Comments)    Reduced Kidney function.  Should no longer take per Dr. Holley Raring  . Tape Rash    Past Medical History:  Diagnosis Date  . Breast cancer ([place]) 08/19/2012   LT LUMPECTOMY, radiation tx.   Marland Kitchen COPD (chronic obstructive pulmonary disease) ([place])   . Hailey-hailey disease   . Hyperlipemia   . Hypertension   . Lung cancer ([place]) 2014   RT LUNG, radiation tx  . Migraine   . Myocardial infarction ([place])   . Osteoporosis   . Personal history of radiation therapy 2013   left breast ca  . Personal history of radiation therapy 2014   lung ca  . Radiation 2013   FOR BREAST CA  . Radiation 2014   FOR LUNG CA  . Renal artery stenosis [facility])     Past Surgical History:  Procedure Laterality Date  . ABDOMINAL HYSTERECTOMY    . APPENDECTOMY    . BREAST BIOPSY Left 08/03/2012   invasive mammary carcinoma, u/s guided bx  . 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 cardiac stents    . PARATHYROIDECTOMY    . TONSILLECTOMY      Social History   Socioeconomic History  . Marital status: Widowed    Spouse name: Not on file  . Number of children: Not on  file  . Years of education: Not on file  . Highest education level: Not on file  Occupational History  . Not on file  Social Needs  . Financial resource strain: Not on file  . Food insecurity:    Worry: Not on file    Inability: Not on file  . Transportation needs:    Medical: Not on file    Non-medical: Not on file  Tobacco Use  . Smoking status: Current Every Day Smoker    Packs/day: 1.00    Years: 70.00    Pack years: 70.00  . Smokeless tobacco: Never Used  Substance and Sexual Activity  . Alcohol use: No  . Drug use: No  . Sexual activity: Never  Lifestyle  . Physical activity:    Days per week: Not on file    Minutes per session: Not on file  . Stress: Not on file  Relationships  . Social connections:    Talks on phone: Not on file    Gets together: Not on file    Attends religious service: Not on file    Active member of club or organization: Not on file    Attends meetings of clubs or organizations: Not on file    Relationship status: Not on file  . Intimate partner violence:    Fear of current or ex partner: Not on file    Emotionally abused: Not on file    Physically abused: Not on  file    Forced sexual activity: Not on file  Other Topics Concern  . Not on file  Social History Narrative  . Not on file    Family History  Problem Relation Age of Onset  . Breast cancer Sister 12     Current Outpatient Medications:  .  albuterol (PROAIR HFA) 108 (90 Base) MCG/ACT inhaler, , Disp: , Rfl:  .  amLODipine (NORVASC) 2.5 MG tablet, Take 2.5 mg by mouth daily., Disp: , Rfl:  .  aspirin 325 MG tablet, Take 325 mg by mouth daily., Disp: , Rfl:  .  calcipotriene-betamethasone (TACLONEX) ointment, , Disp: , Rfl:  .  Calcium Carbonate-Vitamin D (CALCIUM-VITAMIN D) 500-200 MG-UNIT per tablet, Take 1 tablet by mouth daily., Disp: , Rfl:  .  fluticasone (FLONASE) 50 MCG/ACT nasal spray, Place 2 sprays into both nostrils daily., Disp: , Rfl:  .  ketotifen (ZADITOR)  0.025 % ophthalmic solution, 1 drop 2 (two) times daily., Disp: , Rfl:  .  lisinopril (PRINIVIL,ZESTRIL) 20 MG tablet, Take 20 mg by mouth daily., Disp: , Rfl:  .  metoprolol succinate (TOPROL-XL) 50 MG 24 hr tablet, Take 25 mg by mouth daily. Take with or immediately following a meal., Disp: , Rfl:  .  Multiple Vitamin (MULTIVITAMIN) tablet, Take 1 tablet by mouth daily., Disp: , Rfl:  .  pantoprazole (PROTONIX) 20 MG tablet, Take 1 tablet (20 mg total) by mouth daily., Disp: 30 tablet, Rfl: 1 .  simvastatin (ZOCOR) 40 MG tablet, Take 40 mg by mouth daily., Disp: , Rfl:  .  TOPROL XL 25 MG 24 hr tablet, , Disp: , Rfl:  .  traMADol (ULTRAM) 50 MG tablet, Take by mouth every 6 (six) hours as needed., Disp: , Rfl:   Physical exam:  Vitals:   12/27/18 1120 12/27/18 1131  BP:  124/78  Pulse: 98   Resp: 18   Temp: 97.8 F (36.6 C)   TempSrc: Tympanic   SpO2: 93%   Weight: 110 lb (49.9 kg)    Physical Exam Constitutional:      Comments: Thin built. Sitting on bench. No acute distress. Nonpainful appearing. Accompanied by daughter.   HENT:     Head: Normocephalic and atraumatic.     Mouth/Throat:     Mouth: Mucous membranes are moist.     Pharynx: Oropharynx is clear.  Eyes:     General: No scleral icterus.    Conjunctiva/sclera: Conjunctivae normal.  Neck:     Musculoskeletal: Neck supple. No muscular tenderness.  Cardiovascular:     Rate and Rhythm: Normal rate and regular rhythm.  Pulmonary:     Effort: Pulmonary effort is normal.     Breath sounds: Normal breath sounds. No rhonchi.  Abdominal:     General: There is no distension.     Palpations: Abdomen is soft.     Tenderness: There is no abdominal tenderness. There is no guarding or rebound.  Musculoskeletal:     Right shoulder: She exhibits no tenderness and no pain.     Right elbow: She exhibits no swelling. No tenderness found.     Cervical back: She exhibits no tenderness, no bony tenderness and no pain.     Thoracic  back: She exhibits no tenderness, no bony tenderness, no swelling and no pain.     Lumbar back: She exhibits no tenderness, no bony tenderness, no swelling and no pain.       Back:     Right upper arm: She exhibits  no tenderness and no swelling.  Skin:    General: Skin is warm and dry.  Neurological:     General: No focal deficit present.     Mental Status: She is alert and oriented to person, place, and time.     Motor: No weakness.     Gait: Gait normal.  Psychiatric:        Mood and Affect: Mood normal.        Behavior: Behavior normal.      CMP Latest Ref Rng & Units 12/27/2018  Glucose 70 - 99 mg/dL 125(H)  BUN 8 - 23 mg/dL 38(H)  Creatinine 0.44 - 1.00 mg/dL 1.18(H)  Sodium 135 - 145 mmol/L 137  Potassium 3.5 - 5.1 mmol/L 4.4  Chloride 98 - 111 mmol/L 103  CO2 22 - 32 mmol/L 26  Calcium 8.9 - 10.3 mg/dL 9.6  Total Protein 6.5 - 8.1 g/dL 7.5  Total Bilirubin 0.3 - 1.2 mg/dL 0.6  Alkaline Phos 38 - 126 U/L 70  AST 15 - 41 U/L 23  ALT 0 - 44 U/L 14   CBC Latest Ref Rng & Units 12/27/2018  WBC 4.0 - 10.5 K/uL 8.4  Hemoglobin 12.0 - 15.0 g/dL 14.4  Hematocrit 36.0 - 46.0 % 45.4  Platelets 150 - 400 K/uL 246    No images are attached to the encounter.  No results found.  Assessment and plan- Patient is a 85 y.o. female diagnosed with right lower lobe non-small cell lung cancer and history of breast cancer and right upper lobe lung cancer who presents to symptom management clinic for right rib pain.  1.  Right rib pain-etiology unclear-radiation changes?  Progressive disease?  Musculoskeletal? I independently reviewed and discussed with radiologist and overall chest x-ray unrevealing.  Question radiation changes.  Labs today reviewed and overall stable. Pain improved today from weekend which sounds more msk-like. Will get ct scan to further evaluate. In interim, refill of tramadol provided for severe pain. Continue tylenol 650 mg (not to exceed 3 grams in 24 hours) for mild  to moderate pain. Discussed risks vs benefits of opioids and advised patient of methods of avoiding opioid induced constipation with miralax +/- senna with increased water, fiber, and activity. PDMP reviewed and appropriate.   2.  Stage Ia right lower lobe lung carcinoma -unclear if second primary lesion vs pulmonary metastasis from right upper lobe lesion (2014- XRT), s/p XRT 06/2018. Symptomatically improved. Surveillance imaging scheduled for May 2020.  Given acute pain (see above), will move up that scan to later this week.   3.  Stage Ia ER positive, PR borderline, HER-2/neu negative adenocarcinoma of the inner lower quadrant of the left breast.  Given advanced age and low stage disease she did not receive adjuvant chemotherapy.  She has completed 5 years of letrozole, discontinued in 2019.  Screening mammogram on 10/2018 revealed probable benign cluster of cysts in the right breast at 11:00, 3 cm from nipple measuring 6 mm corresponding to mammographic finding.  Recommend right breast ultrasound in June 2020- order placed today.   4. CKD- stage III-IV- creatinine clearance 26, gfr 41. Previous imaging performed with contrast & patient tolerated well. Kidney function overall stable but given ckd, discussed contrast dye with Dr. Grayland Ormond who recommends proceeding with contrast.    CT on 12/31/2018. Follow up with Dr. Grayland Ormond for discussion of results following. Breast ultrasound June 2020.   Visit Diagnosis 1. Rib pain on right side   2. Primary cancer of right  upper lobe of lung ([place])   3. Primary cancer of lower-inner quadrant of left female breast ([place])   4. Chronic kidney disease (CKD), stage III (moderate) (H[facility])     Patient expressed understanding and was in agreement with this plan. She also understands that She can call clinic at any time with any questions, concerns, or complaints.   Thank you for allowing me to participate in the care of this very pleasant patient.   Beckey Rutter,  DNP, AGNP-C [facility] (work cell) [phone] (office)  CC: Dr. Grayland Ormond

## 2018-12-27 NOTE — Patient Instructions (Signed)
I have sent prescription for tramadol to your pharmacy. You can also alternate with tylenol (do not exceed 3 gram in 24 hour period).   You CT Scan has been scheduled for 01/01/2019 at the [facility] on [address]. Scan is at 11:30. Please arrive by 11:15. Water only for 4 hours prior to the scan.   It is common for patients who are undergoing treatment and taking certain prescribed medications to experience side-effects with constipation. If you experience constipation, please take stool softeners such as Senna and/or Miralax every day to avoid constipation.  These medications are available over the counter.  Of course, if you have diarrhea, stop taking stool softeners.  Drinking plenty of fluid, eating fruits and vegetable, and being active also reduces the risk of constipation. If despite taking stool softeners, and you still have no bowel movement for 2 days or more than your normal bowel habit frequency, please take one of the following over the counter laxatives:  Milk of Magnesia or Mag Citrate everyday and contact me immediately for further instructions.  The goal is to have at least one bowel movement every day or every other day without pain or straining.   It was a pleasure seeing you today and thank you for allowing me to participate in your care. Please let me know if you have any questions or concerns. Beckey Rutter, NP

## 2018-12-27 NOTE — Telephone Encounter (Signed)
Daughter called reporting that she is having pain from right flank around the shoulder blade Been occurring off and on for the past month, but is constant for the past 2 days and she can not get any relief. Pain is rated at 8/10. Appointment with [facility]om Management [facility] accepted for this morning. 'Patient will go get CXR prior to appointment.

## 2018-12-28 ENCOUNTER — Encounter: Payer: Self-pay | Admitting: Nurse Practitioner

## 2018-12-28 NOTE — Addendum Note (Signed)
Addended by: Verlon Au on: 12/28/2018 03:12 PM   Modules accepted: Orders

## 2018-12-30 ENCOUNTER — Other Ambulatory Visit: Payer: Self-pay | Admitting: Oncology

## 2018-12-31 ENCOUNTER — Ambulatory Visit
Admission: RE | Admit: 2018-12-31 | Discharge: 2018-12-31 | Disposition: A | Discharge status: Home / Self Care | Payer: Medicare Other | Source: Ambulatory Visit | Attending: Oncology | Admitting: Oncology

## 2018-12-31 DIAGNOSIS — C3411 Malignant neoplasm of upper lobe, right bronchus or lung: Secondary | ICD-10-CM | POA: Diagnosis present

## 2018-12-31 MED ORDER — IOPAMIDOL (ISOVUE-300) INJECTION 61%
60.0000 mL | Freq: Once | INTRAVENOUS | Status: AC | PRN
  Administered 2018-12-31: 60 mL via INTRAVENOUS

## 2019-01-02 NOTE — Progress Notes (Signed)
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  INTERVAL HISTORY: Patient returns to clinic today as an add-on for further evaluation and discussion of her imaging results.  She presented to symptom management clinic with severe rib/flank pain.  Her symptoms have resolved and she feels back to her baseline.  She otherwise feels well and is asymptomatic. She has no neurologic complaints.  She denies any recent fevers or illnesses.  She denies any chest pain, shortness of breath, cough, or hemoptysis. She denies any nausea, vomiting, constipation, or diarrhea. She has no urinary complaints.  Patient offers no further specific complaints today.  REVIEW OF SYSTEMS:   Review of Systems  Constitutional: Negative.  Negative for fever, malaise/fatigue and weight loss.  Respiratory: Negative.  Negative for cough and shortness of breath.   Cardiovascular: Negative.  Negative for chest pain and leg swelling.  Gastrointestinal: Negative.  Negative for abdominal pain, blood in stool, constipation, diarrhea, nausea and vomiting.  Genitourinary: Positive for flank pain.  Musculoskeletal: Negative for back pain.  Skin: Negative.  Negative for rash.  Neurological: Negative.  Negative for tingling, sensory change, focal weakness, weakness and headaches.  Psychiatric/Behavioral: Negative.  The patient is not nervous/anxious and does not have insomnia.     As per HPI. Otherwise, a complete review of systems is negative.  PAST MEDICAL HISTORY: Past Medical History:  Diagnosis Date  .  Breast cancer ([place]) 08/19/2012   LT LUMPECTOMY, radiation tx.   Marland Kitchen COPD (chronic obstructive pulmonary disease) ([place])   . Hailey-hailey disease   . Hyperlipemia   . Hypertension   . Lung cancer ([place]) 2014   RT LUNG, radiation tx  . Migraine   . Myocardial infarction ([place])   . Osteoporosis   . Personal history of radiation therapy 2013   left breast ca  . Personal history of radiation therapy 2014   lung ca  . Radiation 2013   FOR BREAST CA  . Radiation 2014   FOR LUNG CA  . Renal artery stenosis ([place])     PAST SURGICAL HISTORY: Past Surgical History:  Procedure Laterality Date  . ABDOMINAL HYSTERECTOMY    . APPENDECTOMY    . BREAST BIOPSY Left 08/03/2012   invasive mammary carcinoma, u/s guided bx  . 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 cardiac stents    . PARATHYROIDECTOMY    . TONSILLECTOMY      FAMILY HISTORY Family History  Problem Relation Age of Onset  . Breast cancer Sister 53       ADVANCED DIRECTIVES:    HEALTH MAINTENANCE: Social History   Tobacco Use  . Smoking status: Current Every Day Smoker    Packs/day: 1.00    Years: 70.00    Pack years: 70.00  . Smokeless tobacco: Never Used  Substance Use Topics  . Alcohol use: No  . Drug use: No      Allergies  Allergen Reactions  . Bactrim [Sulfamethoxazole-Trimethoprim] Other (See Comments)    Reduced kidney function.  Per Dr. Holley Raring should not take  . Ibuprofen  Reduced kidney function. Per Dr. Holley Raring should not take  . Macrobid [employer] Macro] Other (See Comments)    Reduced Kidney function.  Should no longer take per Dr. Holley Raring  . Tape Rash    Current Outpatient Medications  Medication Sig Dispense Refill  . albuterol (PROAIR HFA) 108 (90 Base) MCG/ACT inhaler     . amLODipine (NORVASC) 2.5 MG tablet Take 2.5 mg by mouth daily.    Marland Kitchen aspirin 325 MG tablet Take 325 mg by mouth daily.    .  calcipotriene-betamethasone (TACLONEX) ointment     . Calcium Carbonate-Vitamin D (CALCIUM-VITAMIN D) 500-200 MG-UNIT per tablet Take 1 tablet by mouth daily.    . fluticasone (FLONASE) 50 MCG/ACT nasal spray Place 2 sprays into both nostrils daily.    Marland Kitchen ketotifen (ZADITOR) 0.025 % ophthalmic solution 1 drop 2 (two) times daily.    Marland Kitchen letrozole (FEMARA) 2.5 MG tablet Take 2.5 mg by mouth daily.    . metoprolol succinate (TOPROL-XL) 50 MG 24 hr tablet Take 25 mg by mouth daily. Take with or immediately following a meal.    . Multiple Vitamin (MULTIVITAMIN) tablet Take 1 tablet by mouth daily.    . Multiple Vitamins-Minerals (MULTIVITAMIN ADULT PO) Take 1 tablet by mouth.    . pantoprazole (PROTONIX) 20 MG tablet Take 1 tablet (20 mg total) by mouth daily. 30 tablet 1  . simvastatin (ZOCOR) 40 MG tablet Take 40 mg by mouth daily.    . traMADol (ULTRAM) 50 MG tablet Take 1 tablet (50 mg total) by mouth every 6 (six) hours as needed for severe pain. 30 tablet 0   No current facility-administered medications for this visit.     OBJECTIVE: Vitals:   01/03/19 0956  BP: 128/76  Pulse: 90  Temp: (!) 97.3 F (36.3 C)     Body mass index is 20.03 kg/m.    ECOG FS:0 - Asymptomatic  General: Well-developed, well-nourished, no acute distress. Eyes: Pink conjunctiva, anicteric sclera. HEENT: Normocephalic, moist mucous membranes. Lungs: Clear to auscultation bilaterally. Heart: Regular rate and rhythm. No rubs, murmurs, or gallops. Abdomen: Soft, nontender, nondistended. No organomegaly noted, normoactive bowel sounds. Musculoskeletal: No edema, cyanosis, or clubbing. Neuro: Alert, answering all questions appropriately. Cranial nerves grossly intact. Skin: No rashes or petechiae noted. Psych: Normal affect.  LAB RESULTS:  Lab Results  Component Value Date   NA 137 12/27/2018   K 4.4 12/27/2018   CL 103 12/27/2018   CO2 26 12/27/2018   GLUCOSE 125 (H) 12/27/2018   BUN 38 (H) 12/27/2018    CREATININE 1.18 (H) 12/27/2018   CALCIUM 9.6 12/27/2018   PROT 7.5 12/27/2018   ALBUMIN 4.1 12/27/2018   AST 23 12/27/2018   ALT 14 12/27/2018   ALKPHOS 70 12/27/2018   BILITOT 0.6 12/27/2018   GFRNONAA 41 (L) 12/27/2018   GFRAA 48 (L) 12/27/2018    Lab Results  Component Value Date   WBC 8.4 12/27/2018   NEUTROABS 6.2 12/27/2018   HGB 14.4 12/27/2018   HCT 45.4 12/27/2018   MCV 95.0 12/27/2018   PLT 246 12/27/2018     STUDIES: Dg Chest 2 View  Result Date: 12/27/2018 CLINICAL DATA:  Right flank and interscapular pain. History of right lung cancer with radiation therapy. EXAM: CHEST - 2 VIEW COMPARISON:  Radiographs 03/05/2015.  CT 09/29/2018 FINDINGS: The heart size and mediastinal contours are stable. There is aortic atherosclerosis and ectasias. A coronary artery stent is noted. The lungs are hyperinflated with biapical scarring. Linear  scarring in the right upper lobe appears similar to previous studies. No superimposed airspace disease or focal nodularity identified. There is no pleural effusion or pneumothorax. The bones appear unchanged with a stable lower thoracic compression deformity. Probable postsurgical changes in the left breast. IMPRESSION: Radiographically stable appearance of the chest without evidence of acute process or progressive metastatic disease. Electronically Signed   By: Richardean Sale M.D.   On: 12/27/2018 11:57   Ct Chest W Contrast  Result Date: 12/31/2018 CLINICAL DATA:  85 year old female with history of lung cancer treated with radiation in 2019. Additional history of left-sided breast cancer status post lumpectomy and radiation therapy. Right-sided back pain, worsening over the past 1-2 months. EXAM: CT CHEST WITH CONTRAST TECHNIQUE: Multidetector CT imaging of the chest was performed during intravenous contrast administration. CONTRAST:  66m ISOVUE-300 IOPAMIDOL (ISOVUE-300) INJECTION 61% COMPARISON:  Chest CT 09/29/2018.  PET-CT 04/02/2018.  FINDINGS: Cardiovascular: Heart size is normal. There is no significant pericardial fluid, thickening or pericardial calcification. There is aortic atherosclerosis, as well as atherosclerosis of the great vessels of the mediastinum and the coronary arteries, including calcified atherosclerotic plaque in the left anterior descending, left circumflex and right coronary arteries. Aneurysmal dilatation of the descending thoracic aorta which measures up to 4.2 cm in diameter (previously 3.9 cm), with extensive mural thrombus and/or atheromatous plaque. Mediastinum/Nodes: No pathologically enlarged mediastinal or hilar lymph nodes. Esophagus is unremarkable in appearance. No axillary lymphadenopathy. Lungs/Pleura: When compared to prior examinations the mass-like area of architectural distortion in the right upper lobe appears similar to prior studies, currently measuring 3.5 x 1.8 cm (previously 3.3 x 2.0 cm on 09/29/2018), most compatible with an area of chronic postradiation mass-like fibrosis. Persistent nodular area of architectural distortion in the right lower lobe also noted, also similar to prior studies, most compatible with scarring. No other new suspicious appearing pulmonary nodules or masses are noted. Diffuse bronchial wall thickening with mild to moderate centrilobular and paraseptal emphysema. Throughout the right lower lobe there is some patchy peribronchovascular ground-glass attenuation which is similar to prior studies, and associated with areas of chronic mucoid impaction. No confluent consolidative airspace disease. No pleural effusions. Upper Abdomen: Incompletely imaged infrarenal abdominal aortic aneurysm measuring at least 4.8 x 3.9 cm (axial image 158 of series 2). Multifocal left adrenal nodularity, similar to the prior examination, with the largest left adrenal nodule measuring up to 2.1 cm in diameter, previously characterized as adenomas. Musculoskeletal: Old healed fracture with mild  posttraumatic deformity in the anterolateral aspect of the right third rib. Chronic appearing T11 compression fraction with 30% loss of anterior vertebral body height. There are no aggressive appearing lytic or blastic lesions noted in the visualized portions of the skeleton. IMPRESSION: 1. Areas of chronic mucoid impaction in the right lower lobe with some peribronchovascular ground-glass attenuation which likely reflects chronic infection/inflammation. 2. No other acute findings noted in the thorax to account for the patient's symptoms. 3. Stable area of postradiation mass-like fibrosis in the right upper lobe. 4. Aortic atherosclerosis, in addition to 3 vessel coronary artery disease. 5. In addition, there is aneurysmal dilatation of the descending thoracic aorta which is increased compared to prior studies, measuring up to 4.2 cm on today's examination. There is also incompletely imaged aneurysmal dilatation of the infrarenal abdominal aorta which measures at least 4.8 x 3.9 cm. Given the patient's history of back pain, further clinical evaluation may be warranted. 6. Diffuse bronchial wall thickening with mild to moderate centrilobular and paraseptal emphysema; imaging  findings suggestive of underlying COPD. Aortic Atherosclerosis (ICD10-I70.0). Emphysema (ICD10-J43.9). Aortic aneurysm NOS (ICD10-I71.9). Electronically Signed   By: Vinnie Langton M.D.   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  PLAN:    1. Stage Ia right lower lobe lung carcinoma: Unclear if this was a second primary lesion or a pulmonary metastasis from her right upper lobe lesion diagnosed in 2014. Patient completed XRT to this lesion on September 08, 2016.  PET scan results from Apr 02, 2018 revealed suspected recurrence in  subcarinal lymph nodes.  She underwent XRT completing in August 2019.  CT scan results from December 31, 2018 reviewed independently and reported as above with no obvious evidence of recurrent or progressive disease.  Patient has been instructed to keep her previously scheduled follow-up appointment in May for further evaluation and continuation of treatment of Prolia.  Consider reimaging in 6 months in August 2020. 2. 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  Oncotype testing was not performed as this would not change the treatment plan.  Patient completed 5 years of letrozole in November 2019.  Her most recent mammogram on October 25, 2018 was reported as BI-RADS 3.  Recommendation is to repeat right breast ultrasound in 6 months in June 2020. 3. Osteoporosis: Patient's most recent bone mineral density on October 04, 2018 reported T score of -3.1 which is slightly worse than 1 year prior when the reported T score was -2.7.  Continue with calcium and vitamin D supplementation.  Return for Prolia in May as scheduled. 4.  Stage Ia adenocarcinoma of the right upper lobe lung: Diagnosed on August 04, 2013.  Patient previously elected to proceed with XRT only.  CT scan results as above.  Patient expressed understanding and was in agreement with this plan. She also understands that She can call clinic at any time with any questions, concerns, or complaints.    Lloyd Huger, MD 01/04/19 12:36 PM

## 2019-01-03 ENCOUNTER — Inpatient Hospital Stay (HOSPITAL_BASED_OUTPATIENT_CLINIC_OR_DEPARTMENT_OTHER): Payer: Medicare Other | Admitting: Oncology

## 2019-01-03 ENCOUNTER — Other Ambulatory Visit: Payer: Self-pay

## 2019-01-03 VITALS — BP 128/76 | HR 90 | Temp 97.3°F | Ht 63.0 in | Wt 113.1 lb

## 2019-01-03 DIAGNOSIS — Z853 Personal history of malignant neoplasm of breast: Secondary | ICD-10-CM | POA: Diagnosis not present

## 2019-01-03 DIAGNOSIS — Z85118 Personal history of other malignant neoplasm of bronchus and lung: Secondary | ICD-10-CM | POA: Diagnosis not present

## 2019-01-03 DIAGNOSIS — Z923 Personal history of irradiation: Secondary | ICD-10-CM

## 2019-01-03 DIAGNOSIS — F17210 Nicotine dependence, cigarettes, uncomplicated: Secondary | ICD-10-CM

## 2019-01-03 DIAGNOSIS — M810 Age-related osteoporosis without current pathological fracture: Secondary | ICD-10-CM

## 2019-01-03 DIAGNOSIS — C50312 Malignant neoplasm of lower-inner quadrant of left female breast: Secondary | ICD-10-CM

## 2019-01-03 DIAGNOSIS — I10 Essential (primary) hypertension: Secondary | ICD-10-CM

## 2019-01-03 DIAGNOSIS — C3411 Malignant neoplasm of upper lobe, right bronchus or lung: Secondary | ICD-10-CM

## 2019-01-03 NOTE — Progress Notes (Signed)
Patient is here today to follow up on her primary cancer of right upper lobe of lung. Patient stated that she gets SOB on exertion. Patient also stated that she usually have an appetite but makes herself eat because she has to.

## 2019-01-26 DIAGNOSIS — N2581 Secondary hyperparathyroidism of renal origin: Secondary | ICD-10-CM | POA: Insufficient documentation

## 2019-04-13 ENCOUNTER — Ambulatory Visit: Payer: Medicare Other

## 2019-04-13 ENCOUNTER — Other Ambulatory Visit: Payer: Medicare Other

## 2019-04-15 ENCOUNTER — Ambulatory Visit: Payer: Medicare Other | Admitting: Oncology

## 2019-04-15 ENCOUNTER — Ambulatory Visit: Payer: Medicare Other

## 2019-04-15 ENCOUNTER — Other Ambulatory Visit: Payer: Medicare Other

## 2019-04-27 ENCOUNTER — Ambulatory Visit
Admission: RE | Admit: 2019-04-27 | Discharge: 2019-04-27 | Disposition: A | Discharge status: Home / Self Care | Payer: Medicare Other | Source: Ambulatory Visit | Attending: Nurse Practitioner | Admitting: Nurse Practitioner

## 2019-04-27 ENCOUNTER — Other Ambulatory Visit: Payer: Self-pay

## 2019-04-27 DIAGNOSIS — C50312 Malignant neoplasm of lower-inner quadrant of left female breast: Secondary | ICD-10-CM | POA: Diagnosis present

## 2019-09-08 DIAGNOSIS — M546 Pain in thoracic spine: Secondary | ICD-10-CM | POA: Insufficient documentation

## 2019-09-29 ENCOUNTER — Other Ambulatory Visit: Payer: Self-pay | Admitting: Oncology

## 2019-10-01 NOTE — Progress Notes (Signed)
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  INTERVAL HISTORY: Patient returns to clinic today for further evaluation and continuation of Prolia.  She currently feels well and is asymptomatic.  She does not complain of pain today. She has no neurologic complaints.  She denies any recent fevers or illnesses.  She denies any chest pain, shortness of breath, cough, or hemoptysis. She denies any nausea, vomiting, constipation, or diarrhea. She has no urinary complaints.  Patient offers no specific complaint today.  REVIEW OF SYSTEMS:   Review of Systems  Constitutional: Negative.  Negative for fever, malaise/fatigue and weight loss.  Respiratory: Negative.  Negative for cough and shortness of breath.   Cardiovascular: Negative.  Negative for chest pain and leg swelling.  Gastrointestinal: Negative.  Negative for abdominal pain, blood in stool, constipation, diarrhea, nausea and vomiting.  Genitourinary: Negative.  Negative for flank pain.  Musculoskeletal: Negative for back pain.  Skin: Negative.  Negative for rash.  Neurological: Negative.  Negative for tingling, sensory change, focal weakness, weakness and headaches.  Psychiatric/Behavioral: Negative.  The patient is not nervous/anxious and does not have insomnia.     As per HPI. Otherwise, a complete review of systems is negative.  PAST MEDICAL HISTORY: Past Medical History:  Diagnosis Date  . Breast cancer ([place]) 08/19/2012   LT LUMPECTOMY, radiation tx.   Marland Kitchen COPD (chronic obstructive pulmonary disease) ([place])   .  Hailey-hailey disease   . Hyperlipemia   . Hypertension   . Lung cancer ([place]) 2014   RT LUNG, radiation tx  . Migraine   . Myocardial infarction ([place])   . Osteoporosis   . Personal history of radiation therapy 2013   left breast ca  . Personal history of radiation therapy 2014   lung ca  . Radiation 2013   FOR BREAST CA  . Radiation 2014   FOR LUNG CA  . Renal artery stenosis ([place])     PAST SURGICAL HISTORY: Past Surgical History:  Procedure Laterality Date  . ABDOMINAL HYSTERECTOMY    . APPENDECTOMY    . BREAST BIOPSY Left 08/03/2012   invasive mammary carcinoma, u/s guided bx  . 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 cardiac stents    . PARATHYROIDECTOMY    . TONSILLECTOMY      FAMILY HISTORY Family History  Problem Relation Age of Onset  . Breast cancer Sister 64       ADVANCED DIRECTIVES:    HEALTH MAINTENANCE: Social History   Tobacco Use  . Smoking status: Current Every Day Smoker    Packs/day: 1.00    Years: 70.00    Pack years: 70.00  . Smokeless tobacco: Never Used  Substance Use Topics  . Alcohol use: No  . Drug use: No      Allergies  Allergen Reactions  . Bactrim [Sulfamethoxazole-Trimethoprim] Other (See Comments)    Reduced kidney function.  Per Dr. Holley Raring should not take  . Ibuprofen     Reduced kidney function. Per Dr. Holley Raring should not take  . Macrobid [employer] Macro] Other (See Comments)  Reduced Kidney function.  Should no longer take per Dr. Holley Raring  . Tape Rash    Current Outpatient Medications  Medication Sig Dispense Refill  . albuterol (PROAIR HFA) 108 (90 Base) MCG/ACT inhaler     . amLODipine (NORVASC) 2.5 MG tablet Take 2.5 mg by mouth daily.    Marland Kitchen aspirin 325 MG tablet Take 325 mg by mouth daily.    . calcipotriene-betamethasone (TACLONEX) ointment     . Calcium Carbonate-Vitamin D (CALCIUM-VITAMIN D) 500-200 MG-UNIT per tablet Take 1 tablet  by mouth daily.    . fluticasone (FLONASE) 50 MCG/ACT nasal spray Place 2 sprays into both nostrils daily.    Marland Kitchen ketotifen (ZADITOR) 0.025 % ophthalmic solution 1 drop 2 (two) times daily.    Marland Kitchen letrozole (FEMARA) 2.5 MG tablet Take 2.5 mg by mouth daily.    . metoprolol succinate (TOPROL-XL) 50 MG 24 hr tablet Take 25 mg by mouth daily. Take with or immediately following a meal.    . Multiple Vitamin (MULTIVITAMIN) tablet Take 1 tablet by mouth daily.    . Multiple Vitamins-Minerals (MULTIVITAMIN ADULT PO) Take 1 tablet by mouth.    . pantoprazole (PROTONIX) 20 MG tablet Take 1 tablet (20 mg total) by mouth daily. 30 tablet 1  . simvastatin (ZOCOR) 40 MG tablet Take 40 mg by mouth daily.    . traMADol (ULTRAM) 50 MG tablet Take 1 tablet (50 mg total) by mouth every 6 (six) hours as needed for severe pain. 30 tablet 0   No current facility-administered medications for this visit.     OBJECTIVE: Vitals:   10/06/19 1124  BP: (!) 151/82  Pulse: 96  Resp: 16  Temp: (!) 97.3 F (36.3 C)  SpO2: 98%     Body mass index is 19.96 kg/m.    ECOG FS:0 - Asymptomatic  General: Thin, no acute distress. Eyes: Pink conjunctiva, anicteric sclera. HEENT: Normocephalic, moist mucous membranes, clear oropharnyx. Breast: Patient requested exam be deferred today. Lungs: Clear to auscultation bilaterally. Heart: Regular rate and rhythm. No rubs, murmurs, or gallops. Abdomen: Soft, nontender, nondistended. No organomegaly noted, normoactive bowel sounds. Musculoskeletal: No edema, cyanosis, or clubbing. Neuro: Alert, answering all questions appropriately. Cranial nerves grossly intact. Skin: No rashes or petechiae noted. Psych: Normal affect.  LAB RESULTS:  Lab Results  Component Value Date   NA 136 10/06/2019   K 4.9 10/06/2019   CL 101 10/06/2019   CO2 27 10/06/2019   GLUCOSE 99 10/06/2019   BUN 28 (H) 10/06/2019   CREATININE 1.16 (H) 10/06/2019   CALCIUM 9.4 10/06/2019   PROT 7.6  10/06/2019   ALBUMIN 3.9 10/06/2019   AST 21 10/06/2019   ALT 15 10/06/2019   ALKPHOS 87 10/06/2019   BILITOT 0.6 10/06/2019   GFRNONAA 42 (L) 10/06/2019   GFRAA 48 (L) 10/06/2019    Lab Results  Component Value Date   WBC 11.0 (H) 10/06/2019   NEUTROABS 8.1 (H) 10/06/2019   HGB 13.8 10/06/2019   HCT 43.5 10/06/2019   MCV 94.8 10/06/2019   PLT 290 10/06/2019     STUDIES: No results found.  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  PLAN:    1. Stage Ia right lower lobe lung carcinoma: Unclear if this was a second primary lesion or a pulmonary metastasis from her right upper lobe lesion  diagnosed in 2014. Patient completed XRT to this lesion on September 08, 2016.  PET scan results from Apr 02, 2018 revealed suspected recurrence in subcarinal lymph nodes.  She underwent XRT completing in August 2019.  CT scan results from December 31, 2018 reviewed independently and reported as above with no obvious evidence of recurrent or progressive disease.  Repeat CT scan in the next 1 to 2 weeks with video assisted telemedicine visit 1 to 2 days later. 2. 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  Oncotype testing was not performed as this would not change the treatment plan.  Patient completed 5 years of letrozole in November 2019.  Her most recent mammogram on October 25, 2018 was reported as BI-RADS 3.  Repeat mammogram in the next 1 to 2 weeks. 3. Osteoporosis: Patient's most recent bone mineral density on October 04, 2018 reported T score of -3.1 which is slightly worse than 1 year prior when the reported T score was -2.7.  Continue with calcium and vitamin D  supplementation.  Patient will require repeat bone mineral density in the next 1 to 2 weeks.  Proceed with Prolia today.  Return to clinic in 6 months with repeat laboratory work, further evaluation, and consideration of treatment. 4.  Stage Ia adenocarcinoma of the right upper lobe lung: Diagnosed on August 04, 2013.  Patient previously elected to proceed with XRT only.  Repeat CT scan as above.  Patient expressed understanding and was in agreement with this plan. She also understands that She can call clinic at any time with any questions, concerns, or complaints.    Lloyd Huger, MD 10/07/19 6:50 AM

## 2019-10-04 ENCOUNTER — Other Ambulatory Visit: Payer: Self-pay

## 2019-10-04 DIAGNOSIS — C3411 Malignant neoplasm of upper lobe, right bronchus or lung: Secondary | ICD-10-CM

## 2019-10-05 ENCOUNTER — Other Ambulatory Visit: Payer: Self-pay

## 2019-10-05 NOTE — Progress Notes (Signed)
Patient pre screened for office appointment, no questions or concerns today. Patient reminded of upcoming appointment time and date. 

## 2019-10-06 ENCOUNTER — Other Ambulatory Visit: Payer: Self-pay

## 2019-10-06 ENCOUNTER — Inpatient Hospital Stay: Payer: Medicare Other

## 2019-10-06 ENCOUNTER — Encounter: Payer: Self-pay | Admitting: Oncology

## 2019-10-06 ENCOUNTER — Inpatient Hospital Stay: Payer: Medicare Other | Attending: Oncology

## 2019-10-06 ENCOUNTER — Inpatient Hospital Stay (HOSPITAL_BASED_OUTPATIENT_CLINIC_OR_DEPARTMENT_OTHER): Payer: Medicare Other | Admitting: Oncology

## 2019-10-06 VITALS — BP 151/82 | HR 96 | Temp 97.3°F | Resp 16 | Wt 112.7 lb

## 2019-10-06 DIAGNOSIS — M810 Age-related osteoporosis without current pathological fracture: Secondary | ICD-10-CM | POA: Insufficient documentation

## 2019-10-06 DIAGNOSIS — C3411 Malignant neoplasm of upper lobe, right bronchus or lung: Secondary | ICD-10-CM

## 2019-10-06 DIAGNOSIS — C50312 Malignant neoplasm of lower-inner quadrant of left female breast: Secondary | ICD-10-CM

## 2019-10-06 DIAGNOSIS — Z853 Personal history of malignant neoplasm of breast: Secondary | ICD-10-CM | POA: Diagnosis not present

## 2019-10-06 DIAGNOSIS — Z85118 Personal history of other malignant neoplasm of bronchus and lung: Secondary | ICD-10-CM | POA: Insufficient documentation

## 2019-10-06 LAB — CBC WITH DIFFERENTIAL/PLATELET
Abs Immature Granulocytes: 0.09 10*3/uL — ABNORMAL HIGH (ref 0.00–0.07)
Basophils Absolute: 0.1 10*3/uL (ref 0.0–0.1)
Basophils Relative: 1 %
Eosinophils Absolute: 0.2 10*3/uL (ref 0.0–0.5)
Eosinophils Relative: 2 %
HCT: 43.5 % (ref 36.0–46.0)
Hemoglobin: 13.8 g/dL (ref 12.0–15.0)
Immature Granulocytes: 1 %
Lymphocytes Relative: 15 %
Lymphs Abs: 1.7 10*3/uL (ref 0.7–4.0)
MCH: 30.1 pg (ref 26.0–34.0)
MCHC: 31.7 g/dL (ref 30.0–36.0)
MCV: 94.8 fL (ref 80.0–100.0)
Monocytes Absolute: 0.8 10*3/uL (ref 0.1–1.0)
Monocytes Relative: 7 %
Neutro Abs: 8.1 10*3/uL — ABNORMAL HIGH (ref 1.7–7.7)
Neutrophils Relative %: 74 %
Platelets: 290 10*3/uL (ref 150–400)
RBC: 4.59 MIL/uL (ref 3.87–5.11)
RDW: 13.8 % (ref 11.5–15.5)
WBC: 11 10*3/uL — ABNORMAL HIGH (ref 4.0–10.5)
nRBC: 0 % (ref 0.0–0.2)

## 2019-10-06 LAB — COMPREHENSIVE METABOLIC PANEL
ALT: 15 U/L (ref 0–44)
AST: 21 U/L (ref 15–41)
Albumin: 3.9 g/dL (ref 3.5–5.0)
Alkaline Phosphatase: 87 U/L (ref 38–126)
Anion gap: 8 (ref 5–15)
BUN: 28 mg/dL — ABNORMAL HIGH (ref 8–23)
CO2: 27 mmol/L (ref 22–32)
Calcium: 9.4 mg/dL (ref 8.9–10.3)
Chloride: 101 mmol/L (ref 98–111)
Creatinine, Ser: 1.16 mg/dL — ABNORMAL HIGH (ref 0.44–1.00)
GFR calc Af Amer: 48 mL/min — ABNORMAL LOW (ref >60)
GFR calc non Af Amer: 42 mL/min — ABNORMAL LOW (ref >60)
Glucose, Bld: 99 mg/dL (ref 70–99)
Potassium: 4.9 mmol/L (ref 3.5–5.1)
Sodium: 136 mmol/L (ref 135–145)
Total Bilirubin: 0.6 mg/dL (ref 0.3–1.2)
Total Protein: 7.6 g/dL (ref 6.5–8.1)

## 2019-10-06 MED ORDER — DENOSUMAB 60 MG/ML ~~LOC~~ SOSY
60.0000 mg | PREFILLED_SYRINGE | Freq: Once | SUBCUTANEOUS | Status: AC
  Administered 2019-10-06: 60 mg via SUBCUTANEOUS
  Filled 2019-10-06: qty 1

## 2019-10-18 ENCOUNTER — Other Ambulatory Visit: Payer: Self-pay | Admitting: Oncology

## 2019-10-18 DIAGNOSIS — C50312 Malignant neoplasm of lower-inner quadrant of left female breast: Secondary | ICD-10-CM

## 2019-10-18 DIAGNOSIS — N179 Acute kidney failure, unspecified: Secondary | ICD-10-CM | POA: Insufficient documentation

## 2019-10-18 DIAGNOSIS — R809 Proteinuria, unspecified: Secondary | ICD-10-CM | POA: Insufficient documentation

## 2019-10-24 ENCOUNTER — Other Ambulatory Visit: Payer: Self-pay

## 2019-10-24 ENCOUNTER — Ambulatory Visit
Admission: RE | Admit: 2019-10-24 | Discharge: 2019-10-24 | Disposition: A | Discharge status: Home / Self Care | Payer: Medicare Other | Source: Ambulatory Visit | Attending: Oncology | Admitting: Oncology

## 2019-10-24 DIAGNOSIS — C3411 Malignant neoplasm of upper lobe, right bronchus or lung: Secondary | ICD-10-CM | POA: Diagnosis present

## 2019-10-24 MED ORDER — IOHEXOL 300 MG/ML  SOLN
55.0000 mL | Freq: Once | INTRAMUSCULAR | Status: AC | PRN
  Administered 2019-10-24: 55 mL via INTRAVENOUS

## 2019-10-25 NOTE — Progress Notes (Signed)
Patient pre screened for office appointment, no questions or concerns today. Patient reminded of upcoming appointment time and date. 

## 2019-10-26 ENCOUNTER — Inpatient Hospital Stay: Payer: Medicare Other | Attending: Oncology | Admitting: Oncology

## 2019-10-26 DIAGNOSIS — C3411 Malignant neoplasm of upper lobe, right bronchus or lung: Secondary | ICD-10-CM

## 2019-10-27 NOTE — Progress Notes (Signed)
[place]  [phone] Fax:(336) [phone]  ID: [name] [name] OB: 1928-01-31  MR#: [id number]  SKA#:[id number]  Patient Care Team: Ezequiel Kayser, MD as PCP - General (Internal Medicine) Lloyd Huger, MD as Medical Oncologist (Medical Oncology)  I connected with [name] [name] on 10/27/19 at 11:30 AM EST by video enabled telemedicine visit and verified that I am speaking with the correct person using two identifiers.   I discussed the limitations, risks, security and privacy concerns of performing an evaluation and management service by telemedicine and the availability of in-person appointments. I also discussed with the patient that there may be a patient responsible charge related to this service. The patient expressed understanding and agreed to proceed.   Other persons participating in the visit and their role in the encounter: Patient, MD.  Patients location: Home. Providers location: Clinic.  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  INTERVAL HISTORY: Patient agreed to video enabled telemedicine visit for further evaluation and discussion of her imaging results.  She currently feels well and is asymptomatic. She does not complain of pain today. She has no neurologic complaints.  She denies any recent fevers or illnesses.  She denies any chest pain, shortness of breath, cough, or hemoptysis. She denies any nausea, vomiting, constipation, or diarrhea. She has no urinary complaints.  Patient offers no specific complaints today.  REVIEW OF SYSTEMS:   Review of Systems  Constitutional: Negative.  Negative for fever, malaise/fatigue and weight loss.  Respiratory: Negative.  Negative for cough and shortness of breath.   Cardiovascular: Negative.  Negative for chest pain and leg swelling.    Gastrointestinal: Negative.  Negative for abdominal pain, blood in stool, constipation, diarrhea, nausea and vomiting.  Genitourinary: Negative.  Negative for flank pain.  Musculoskeletal: Negative for back pain.  Skin: Negative.  Negative for rash.  Neurological: Negative.  Negative for tingling, sensory change, focal weakness, weakness and headaches.  Psychiatric/Behavioral: Negative.  The patient is not nervous/anxious and does not have insomnia.     As per HPI. Otherwise, a complete review of systems is negative.  PAST MEDICAL HISTORY: Past Medical History:  Diagnosis Date   Breast cancer ([place]) 08/19/2012   LT LUMPECTOMY, radiation tx.    COPD (chronic obstructive pulmonary disease) ([place])    Hailey-hailey disease    Hyperlipemia    Hypertension    Lung cancer ([place]) 2014   RT LUNG, radiation tx   Migraine    Myocardial infarction [facility])    Osteoporosis    Personal history of radiation therapy 2013   left breast ca   Personal history of radiation therapy 2014   lung ca   Radiation 2013   FOR BREAST CA   Radiation 2014   FOR LUNG CA   Renal artery stenosis ([place])     PAST SURGICAL HISTORY: Past Surgical History:  Procedure Laterality Date   ABDOMINAL HYSTERECTOMY     APPENDECTOMY     BREAST BIOPSY Left 08/03/2012   invasive mammary carcinoma, u/s guided bx   BREAST LUMPECTOMY Left 2013   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  Per Dr. Holley Raring should not take   Ibuprofen     Reduced kidney function. Per Dr. Holley Raring should not take   [facility] Macro] Other (See Comments)    Reduced Kidney function.  Should no longer take per Dr. Holley Raring   Tape Rash    Current Outpatient Medications  Medication Sig Dispense Refill   albuterol (PROAIR HFA) 108 (90 Base) MCG/ACT inhaler      amLODipine (NORVASC) 2.5 MG tablet Take 2.5 mg by mouth daily.     aspirin 325 MG tablet Take 325 mg by mouth daily.     calcipotriene-betamethasone (TACLONEX) ointment      Calcium Carbonate-Vitamin D (CALCIUM-VITAMIN D) 500-200 MG-UNIT per tablet Take 1 tablet by mouth daily.     fluticasone (FLONASE) 50 MCG/ACT nasal spray Place 2 sprays into both nostrils daily.     ketotifen (ZADITOR) 0.025 % ophthalmic solution 1 drop 2 (two) times daily.     letrozole (FEMARA) 2.5 MG tablet Take 2.5 mg by mouth daily.     metoprolol succinate (TOPROL-XL) 50 MG 24 hr tablet Take 25 mg by mouth daily. Take with or immediately following a meal.     Multiple Vitamin (MULTIVITAMIN) tablet Take 1 tablet by mouth daily.     Multiple Vitamins-Minerals (MULTIVITAMIN ADULT PO) Take 1 tablet by mouth.     pantoprazole (PROTONIX) 20 MG tablet Take 1 tablet (20 mg total) by mouth daily. 30 tablet 1   simvastatin (ZOCOR) 40 MG tablet Take 40 mg by mouth daily.     traMADol (ULTRAM) 50 MG tablet Take 1 tablet (50 mg total) by mouth every 6 (six) hours as needed for severe pain. 30 tablet 0   No current facility-administered medications for this visit.    OBJECTIVE: There were no vitals filed for this visit.   There is no height or weight on file to calculate BMI.    ECOG FS:0 - Asymptomatic  General: Well-developed, well-nourished, no acute distress. HEENT: Normocephalic. Neuro: Alert, answering all questions appropriately. Cranial nerves grossly  intact. Psych: Normal affect.  LAB RESULTS:  Lab Results  Component Value Date   NA 136 10/06/2019   K 4.9 10/06/2019   CL 101 10/06/2019   CO2 27 10/06/2019   GLUCOSE 99 10/06/2019   BUN 28 (H) 10/06/2019   CREATININE 1.16 (H) 10/06/2019   CALCIUM 9.4 10/06/2019   PROT 7.6 10/06/2019   ALBUMIN 3.9 10/06/2019   AST 21 10/06/2019   ALT 15 10/06/2019   ALKPHOS 87 10/06/2019   BILITOT 0.6 10/06/2019   GFRNONAA 42 (L) 10/06/2019   GFRAA 48 (L) 10/06/2019    Lab Results  Component Value Date   WBC 11.0 (H) 10/06/2019   NEUTROABS 8.1 (H) 10/06/2019   HGB 13.8 10/06/2019   HCT 43.5 10/06/2019   MCV 94.8 10/06/2019   PLT 290 10/06/2019     STUDIES: CT Chest W Contrast  Result Date: 10/24/2019 CLINICAL DATA:  Follow-up lung cancer. EXAM: CT CHEST WITH CONTRAST TECHNIQUE: Multidetector CT imaging of the chest was performed during  intravenous contrast administration. CONTRAST:  40m OMNIPAQUE IOHEXOL 300 MG/ML  SOLN COMPARISON:  12/31/2018 FINDINGS: Cardiovascular: The heart size appears within normal limits. No pericardial effusion identified aortic atherosclerosis. Three vessel coronary artery atherosclerotic calcifications. 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 There is extensive noncalcified and calcified pleural plaque is noted. On the previous exam this measured 4.2 cm. Mediastinum/Nodes: Small subcentimeter low-attenuation thyroid nodules are identified The trachea appears patent and is midline. Normal appearance of the esophagus. Lungs/Pleura: Moderate changes of emphysema. Post treatment changes within the right upper lobe are again noted the. This includes a bandlike area of masslike architectural distortion in the right upper lobe, image 62/3. Unchanged 5 mm medial right upper lobe lung nodule, image 46/3. Posterior right upper lobe lung nodule is unchanged measuring 3 mm, image 46/3. Progressive thickening of the peribronchovascular  interstitium with patchy areas of scarring, atelectasis or airspace consolidation within the right lower lobe. The appearance of which is favored to represent postinflammatory/infectious changes. Similarly, there is a patchy area within the left base, image 124/3. Likely post infectious or inflammatory. Within the medial aspect of the superior segment of left lower lobe there is a mass abutting the descending thoracic aorta which measures 4.0 x 2.6 by 4.4 cm. In retrospect this was likely present on previous exam measuring 2.5 x 1.4 by 3.5 cm. Upper Abdomen: No acute abnormality. Bilateral adrenal nodules appear unchanged. Extensive aortic atherosclerosis involves the abdominal aorta. Musculoskeletal: No aggressive lytic or sclerotic bone lesions identified. Unchanged fracture deformity involving the anterolateral aspect of the right third rib. No aggressive lytic or sclerotic bone lesions identified. IMPRESSION: 1. Stable appearance of post treatment changes within the right upper lobe. 2. There is a suspicious mass within the medial aspect of the superior segment of left lower lobe abutting the descending thoracic aorta which has increased in size when compared with previous exam. Further investigation with PET-CT and tissue sampling is advised. 3. Emphysema and aortic atherosclerosis. Three vessel coronary artery calcifications noted. 4. Bilateral adrenal nodules are unchanged from previous exam and are favored to represent adenomas. 5. Progressive thickening of the peribronchovascular interstitium with patchy areas of scarring, atelectasis or airspace consolidation within the right lower lobe and left base. Findings are favored to represent post infectious or inflammatory changes. Attention on follow-up imaging is advised. 6. Emphysema and aortic atherosclerosis. Aortic Atherosclerosis (ICD10-I70.0) and Emphysema (ICD10-J43.9). Electronically Signed   By: TKerby MoorsM.D.   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  PLAN:    1. Stage Ia right lower lobe lung carcinoma: Unclear if this was a second primary lesion or a pulmonary metastasis from her right upper lobe lesion diagnosed in 2014. Patient completed XRT to this lesion on September 08, 2016.  PET scan results from Apr 02, 2018 revealed suspected recurrence in subcarinal lymph nodes.  She underwent XRT completing in August 2019.  CT scan results from October 24, 2019 reviewed independently and reported as above with suspicious, enlarging lesion in left lower lobe.  Will get a PET scan in the next 1 to 2 weeks with video assisted telemedicine visit to follow to discuss the results.  2. 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  Oncotype testing was not performed as this would not change the treatment plan.  Patient completed 5 years of letrozole in November 2019.  Her most recent mammogram on October 25, 2018 was reported as BI-RADS 3.  Her next mammogram is scheduled for October 31, 2019.   3. Osteoporosis: Patient's most recent bone mineral density on October 04, 2018 reported T score of -3.1 which is slightly worse than 1 year prior when the reported T score was -2.7.  Continue with calcium and vitamin D supplementation.  Repeat bone mineral density is scheduled for October 31, 2019.  Patient last received Prolia on October 06, 2019.  Return to clinic as above.  4.  Stage Ia adenocarcinoma of the right upper lobe lung: Diagnosed on August 04, 2013.  Patient previously elected to proceed with XRT only.  PET scan as above.  I provided 15 minutes of face-to-face video visit time  during this encounter, and > 50% was spent counseling as documented under my assessment & plan.  Patient expressed understanding and was in agreement with this plan. She also understands that She can call clinic at any time with any questions, concerns, or complaints.    Lloyd Huger, MD 10/27/19 6:16 PM

## 2019-10-30 NOTE — Progress Notes (Signed)
  [place]  Telephone:(336) [phone] Fax:(336) [phone]  ID: [name] [name] OB: 09-16-1928  MR#: [id number]  [place], MD 10/30/19 10:16 AM     This encounter was created in error - please disregard.

## 2019-10-31 ENCOUNTER — Ambulatory Visit
Admission: RE | Admit: 2019-10-31 | Discharge: 2019-10-31 | Disposition: A | Discharge status: Home / Self Care | Payer: Medicare Other | Source: Ambulatory Visit | Attending: Oncology | Admitting: Oncology

## 2019-10-31 DIAGNOSIS — N644 Mastodynia: Secondary | ICD-10-CM | POA: Insufficient documentation

## 2019-10-31 DIAGNOSIS — Z853 Personal history of malignant neoplasm of breast: Secondary | ICD-10-CM | POA: Insufficient documentation

## 2019-10-31 DIAGNOSIS — C50312 Malignant neoplasm of lower-inner quadrant of left female breast: Secondary | ICD-10-CM

## 2019-10-31 DIAGNOSIS — Z13820 Encounter for screening for osteoporosis: Secondary | ICD-10-CM | POA: Diagnosis not present

## 2019-10-31 DIAGNOSIS — Z85118 Personal history of other malignant neoplasm of bronchus and lung: Secondary | ICD-10-CM | POA: Insufficient documentation

## 2019-10-31 DIAGNOSIS — Z780 Asymptomatic menopausal state: Secondary | ICD-10-CM | POA: Insufficient documentation

## 2019-11-01 ENCOUNTER — Emergency Department
Admission: EM | Admit: 2019-11-01 | Discharge: 2019-11-01 | Disposition: A | Discharge status: Home / Self Care | Payer: Medicare Other | Attending: Student | Admitting: Student

## 2019-11-01 ENCOUNTER — Emergency Department: Payer: Medicare Other

## 2019-11-01 ENCOUNTER — Encounter: Payer: Self-pay | Admitting: Emergency Medicine

## 2019-11-01 ENCOUNTER — Other Ambulatory Visit: Payer: Self-pay

## 2019-11-01 DIAGNOSIS — F17200 Nicotine dependence, unspecified, uncomplicated: Secondary | ICD-10-CM | POA: Diagnosis not present

## 2019-11-01 DIAGNOSIS — I129 Hypertensive chronic kidney disease with stage 1 through stage 4 chronic kidney disease, or unspecified chronic kidney disease: Secondary | ICD-10-CM | POA: Insufficient documentation

## 2019-11-01 DIAGNOSIS — J449 Chronic obstructive pulmonary disease, unspecified: Secondary | ICD-10-CM | POA: Diagnosis not present

## 2019-11-01 DIAGNOSIS — I252 Old myocardial infarction: Secondary | ICD-10-CM | POA: Diagnosis not present

## 2019-11-01 DIAGNOSIS — R079 Chest pain, unspecified: Secondary | ICD-10-CM | POA: Diagnosis present

## 2019-11-01 DIAGNOSIS — Z79899 Other long term (current) drug therapy: Secondary | ICD-10-CM | POA: Diagnosis not present

## 2019-11-01 DIAGNOSIS — Z7982 Long term (current) use of aspirin: Secondary | ICD-10-CM | POA: Insufficient documentation

## 2019-11-01 DIAGNOSIS — N183 Chronic kidney disease, stage 3 unspecified: Secondary | ICD-10-CM | POA: Diagnosis not present

## 2019-11-01 LAB — CBC WITH DIFFERENTIAL/PLATELET
Abs Immature Granulocytes: 0.05 10*3/uL (ref 0.00–0.07)
Basophils Absolute: 0.1 10*3/uL (ref 0.0–0.1)
Basophils Relative: 1 %
Eosinophils Absolute: 0.1 10*3/uL (ref 0.0–0.5)
Eosinophils Relative: 1 %
HCT: 41.8 % (ref 36.0–46.0)
Hemoglobin: 13.4 g/dL (ref 12.0–15.0)
Immature Granulocytes: 1 %
Lymphocytes Relative: 8 %
Lymphs Abs: 0.8 10*3/uL (ref 0.7–4.0)
MCH: 29.9 pg (ref 26.0–34.0)
MCHC: 32.1 g/dL (ref 30.0–36.0)
MCV: 93.3 fL (ref 80.0–100.0)
Monocytes Absolute: 0.7 10*3/uL (ref 0.1–1.0)
Monocytes Relative: 8 %
Neutro Abs: 7.9 10*3/uL — ABNORMAL HIGH (ref 1.7–7.7)
Neutrophils Relative %: 81 %
Platelets: 299 10*3/uL (ref 150–400)
RBC: 4.48 MIL/uL (ref 3.87–5.11)
RDW: 13.9 % (ref 11.5–15.5)
WBC: 9.6 10*3/uL (ref 4.0–10.5)
nRBC: 0 % (ref 0.0–0.2)

## 2019-11-01 LAB — COMPREHENSIVE METABOLIC PANEL
ALT: 12 U/L (ref 0–44)
AST: 19 U/L (ref 15–41)
Albumin: 3.8 g/dL (ref 3.5–5.0)
Alkaline Phosphatase: 99 U/L (ref 38–126)
Anion gap: 11 (ref 5–15)
BUN: 23 mg/dL (ref 8–23)
CO2: 23 mmol/L (ref 22–32)
Calcium: 8.7 mg/dL — ABNORMAL LOW (ref 8.9–10.3)
Chloride: 103 mmol/L (ref 98–111)
Creatinine, Ser: 1.17 mg/dL — ABNORMAL HIGH (ref 0.44–1.00)
GFR calc Af Amer: 48 mL/min — ABNORMAL LOW (ref >60)
GFR calc non Af Amer: 41 mL/min — ABNORMAL LOW (ref >60)
Glucose, Bld: 97 mg/dL (ref 70–99)
Potassium: 4.6 mmol/L (ref 3.5–5.1)
Sodium: 137 mmol/L (ref 135–145)
Total Bilirubin: 0.7 mg/dL (ref 0.3–1.2)
Total Protein: 7.5 g/dL (ref 6.5–8.1)

## 2019-11-01 LAB — TROPONIN I (HIGH SENSITIVITY)
Troponin I (High Sensitivity): 6 ng/L (ref <18)
Troponin I (High Sensitivity): 5 ng/L (ref <18)

## 2019-11-01 LAB — LIPASE, BLOOD: Lipase: 27 U/L (ref 11–51)

## 2019-11-01 MED ORDER — ASPIRIN 81 MG PO CHEW: 324.0000 mg | CHEWABLE_TABLET | Freq: Once | ORAL | Status: DC

## 2019-11-01 MED ORDER — IOHEXOL 350 MG/ML SOLN
75.0000 mL | Freq: Once | INTRAVENOUS | Status: AC | PRN
  Administered 2019-11-01: 75 mL via INTRAVENOUS

## 2019-11-01 MED ORDER — SODIUM CHLORIDE 0.9 % IV BOLUS
500.0000 mL | Freq: Once | INTRAVENOUS | Status: AC
  Administered 2019-11-01: 500 mL via INTRAVENOUS

## 2019-11-01 MED ORDER — ASPIRIN 81 MG PO CHEW
243.0000 mg | CHEWABLE_TABLET | Freq: Once | ORAL | Status: AC
  Administered 2019-11-01: 243 mg via ORAL
  Filled 2019-11-01: qty 3

## 2019-11-01 NOTE — Discharge Instructions (Addendum)
Thank you for letting us take care of you in the emergency department today.   Please continue to take any regular, prescribed medications. Please be sure to stay well hydrated today to help your kidneys clear out the IV contrast you got for your CT scan.   Please follow up with: - Dr. Nehemiah Massed, your cardiology doctor. Call to schedule an appointment. - Dr. Grayland Ormond, your oncology doctor, at your upcoming appointment.  Please return to the ER for any new or worsening symptoms.

## 2019-11-01 NOTE — ED Triage Notes (Signed)
Pt here with c/o left sided cp for the past few days, hx of stent, pain does not radiate this am, however, felt it through to her upper back yesterday. NAD.

## 2019-11-01 NOTE — ED Provider Notes (Signed)
[facility] Emergency Department Provider Note  ____________________________________________   None    (approximate)  I have reviewed the triage vital signs and the nursing notes.  History  Chief Complaint Chest Pain    HPI Ranyia Witting is a 85 y.o. female with history of CAD, COPD, breast cancer, lung cancer (follwed by Dr. Grayland Ormond) who presents to the emergency department for chest pain.  Patient reports left-sided chest pain for 3 days.  Initially it was intermittent, without an identifiable trigger.  Now has become more constant.  Located to the left side and middle of the chest, described as aching, sharp.  Pain radiates somewhat to her back. No alleviating or aggravating factors.  Associated with some nausea, but no shortness of breath.  She denies any fevers or cough.  No recent travel, asymmetric leg swelling, or history of VTE.    Past Medical Hx Past Medical History:  Diagnosis Date  . Breast cancer ([place]) 08/19/2012   LT LUMPECTOMY, radiation tx.   Marland Kitchen COPD (chronic obstructive pulmonary disease) ([place])   . Hailey-hailey disease   . Hyperlipemia   . Hypertension   . Lung cancer ([place]) 2014   RT LUNG, radiation tx  . Migraine   . Myocardial infarction ([place])   . Osteoporosis   . Personal history of radiation therapy 2013   left breast ca  . Personal history of radiation therapy 2014   lung ca  . Radiation 2013   FOR BREAST CA  . Radiation 2014   FOR LUNG CA  . Renal artery stenosis [facility])     Problem List Patient Active Problem List   Diagnosis Date Noted  . Pain in toes of both feet 04/14/2018  . Bradycardia 08/14/2016  . Primary cancer of lower-inner quadrant of left female breast ([place]) 07/17/2016  . Chronic low back pain 06/18/2016  . Combined fat and carbohydrate induced hyperlipemia 01/25/2016  . Myocardial infarction ([place]) 01/25/2016  . Current tobacco use 01/25/2016  . Other long term (current) drug therapy  01/17/2016  . High potassium 12/21/2015  . Dizziness 07/25/2015  . Immunizations incomplete 03/04/2015  . MI (mitral incompetence) 12/20/2014  . Simple chronic bronchitis ([place]) 12/15/2014  . Vasomotor rhinitis 12/15/2014  . Arteriosclerosis of coronary artery 12/12/2014  . TI (tricuspid incompetence) 12/12/2014  . Breathlessness on exertion 12/12/2014  . Chronic kidney disease (CKD), stage III (moderate) 06/15/2014  . Benign essential HTN 06/15/2014  . Primary cancer of right upper lobe of lung ([place]) 06/15/2014  . Osteoporosis, post-menopausal 06/15/2014  . Peripheral vascular disease ([place]) 06/15/2014  . Renal artery stenosis ([place]) 06/15/2014  . History of myocardial infarction 05/17/2001    Past Surgical Hx Past Surgical History:  Procedure Laterality Date  . ABDOMINAL HYSTERECTOMY    . APPENDECTOMY    . BREAST BIOPSY Left 08/03/2012   invasive mammary carcinoma, u/s guided bx  . 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 cardiac stents    . PARATHYROIDECTOMY    . TONSILLECTOMY      Medications Prior to Admission medications   Medication Sig Start Date End Date Taking? Authorizing Provider  albuterol (PROAIR HFA) 108 (90 Base) MCG/ACT inhaler  08/15/15   [provider]  amLODipine (NORVASC) 2.5 MG tablet Take 2.5 mg by mouth daily.    [provider]  aspirin 325 MG tablet Take 325 mg by mouth daily.    [provider]  calcipotriene-betamethasone (TACLONEX) ointment  03/04/18   [provider]  Calcium Carbonate-Vitamin D (CALCIUM-VITAMIN D) 500-200 MG-UNIT per tablet Take 1 tablet by mouth daily.    [provider]  fluticasone (FLONASE) 50 MCG/ACT nasal spray Place 2 sprays into both nostrils daily.    [provider]  ketotifen (ZADITOR) 0.025 % ophthalmic solution 1 drop 2 (two) times daily.    [provider]  letrozole (FEMARA) 2.5 MG tablet Take 2.5  mg by mouth daily.    [provider]  metoprolol succinate (TOPROL-XL) 50 MG 24 hr tablet Take 25 mg by mouth daily. Take with or immediately following a meal.    [provider]  Multiple Vitamin (MULTIVITAMIN) tablet Take 1 tablet by mouth daily.    [provider]  Multiple Vitamins-Minerals (MULTIVITAMIN ADULT PO) Take 1 tablet by mouth.    [provider]  pantoprazole (PROTONIX) 20 MG tablet Take 1 tablet (20 mg total) by mouth daily. 07/15/18   Verlon Au, NP  simvastatin (ZOCOR) 40 MG tablet Take 40 mg by mouth daily.    [provider]  traMADol (ULTRAM) 50 MG tablet Take 1 tablet (50 mg total) by mouth every 6 (six) hours as needed for severe pain. 12/27/18   Verlon Au, NP    Allergies Bactrim [sulfamethoxazole-trimethoprim], Ibuprofen, Macrobid [nitrofurantoin monohyd macro], and Tape  Family Hx Family History  Problem Relation Age of Onset  . Breast cancer Sister 28    Social Hx Social History   Tobacco Use  . Smoking status: Current Every Day Smoker    Packs/day: 1.00    Years: 70.00    Pack years: 70.00  . Smokeless tobacco: Never Used  Substance Use Topics  . Alcohol use: No  . Drug use: No     Review of Systems  Constitutional: Negative for fever, chills. Eyes: Negative for visual changes. ENT: Negative for sore throat. Cardiovascular: + for chest pain. Respiratory: Negative for shortness of breath. Gastrointestinal: Negative for nausea, vomiting.  Genitourinary: Negative for dysuria. Musculoskeletal: Negative for leg swelling. Skin: Negative for rash. Neurological: Negative for for headaches.   Physical Exam  Vital Signs: ED Triage Vitals  Enc Vitals Group     BP 11/01/19 0944 (!) 155/83     Pulse Rate 11/01/19 0944 85     Resp 11/01/19 0944 (!) 24     Temp 11/01/19 0944 98.8 F (37.1 C)     Temp Source 11/01/19 0944 Oral     SpO2 11/01/19 0944 97 %     Weight 11/01/19 0947 107 lb (48.5  kg)     Height 11/01/19 0947 5\' 3"  (1.6 m)     Head Circumference --      Peak Flow --      Pain Score 11/01/19 0946 2     Pain Loc --      Pain Edu? --      Excl. in [place]? --      Constitutional: Alert and oriented.  Head: Normocephalic. Atraumatic. Eyes: Conjunctivae clear. Sclera anicteric. Nose: No congestion. No rhinorrhea. Mouth/Throat: Wearing mask.  Neck: No stridor.   Cardiovascular: Normal rate, regular rhythm. Extremities well perfused.  Equal and symmetric radial and PT pulses. Respiratory: Normal respiratory effort.  Lungs CTAB. Gastrointestinal: Soft. Non-tender. Non-distended.  Musculoskeletal: No lower extremity edema. No deformities. Neurologic:  Normal speech and language. No gross focal neurologic deficits are appreciated.  Skin: Skin is warm, dry and intact. No rash noted. Psychiatric: Mood and affect are appropriate  for situation.  EKG  Personally reviewed.   EKG done 9:54 AM  Rate: 83 Rhythm: sinus Axis: normal Intervals: WNL No acute ischemic changes No STEMI    Radiology  CXR: IMPRESSION: 1. No significant change when compared to the recent prior chest CT. 2. Opacity along the minor fissure at the base of the right upper lobe with adjacent reticular opacities is consistent with treatment related scarring from the previously diagnosed lung carcinoma. 3. Left lower lobe mass abutting the descending thoracic aorta stable prior CT. 4. Right lower lobe reticular type opacities consistent with inflammation/infection, also unchanged from the previous CT.  CT: IMPRESSION:  1. No evidence for thoracic aortic aneurysm or dissection.  2. Extensive calcified and noncalcified atherosclerotic plaque is  noted involving the thoracic and abdominal aorta. Unchanged  appearance of focal aneurysmal dilatation of the descending thoracic  aorta and aneurysmal dilatation of the infrarenal abdominal aorta.  Recommend follow up by [place] in 1year. This  recommendation follows ACR  consensus guidelines: White Paper of the ACR Incidental Findings  Committee II on Vascular Findings. Joellyn Rued [id number]; 02:725-366.  3. Left lower lobe lung mass abutting the descending thoracic aorta  has increased in size from the most recent exam dated 10/24/2019.  4. Three vessel coronary artery calcifications noted.  5. Stable appearance of right lower lobe peribronchovascular  interstitial thickening with patchy areas of scarring, atelectasis  and consolidation.  6. Stable appearance of low-attenuation lesion within the pancreatic  neck. Likely a benign abnormality.  7. Stable appearance of left adrenal nodule.    Procedures  Procedure(s) performed (including critical care):  Procedures   Initial Impression / Assessment and Plan / ED Course  85 y.o. female who presents to the ED for chest pain, as above.  Ddx: ACS, GERD, pancreatitis. Equal and symmetric distal pulses on exam makes dissection less likely, however on recent CT chest (screening by her oncologist) there was noted to be mass abutting the descending thoracic aorta. No hypoxia, though consider PE given her cancer history.  Will obtain labs, EKG, imaging.  EKG without acute ischemic changes.  High-sensitivity troponin x 2 negative, and in fact downtrending.  CT scan negative for dissection.  Her left lower lobe lung mass has increased in size slightly.  Updated the patient and daughter at bedside on this.  Otherwise, chronic, stable findings noted on CT reviewed with the family as well.  Discussed case with [facility] cardiology, given she has had 2 negative troponins, she is appropriate for discharge with close outpatient follow-up.  Discussed this plan of care with the patient and daughter at bedside who are agreeable.  Also advised follow-up with her oncologist given the change in size of the lung mass.  She actually has an appointment already in place for follow-up after getting her PET scan  tomorrow.  As such, patient is stable for discharge, discussed return precautions.  Patient and family comfortable with plan.   Final Clinical Impression(s) / ED Diagnosis  Final diagnoses:  Chest pain in adult       Note:  This document was prepared using Dragon voice recognition software and may include unintentional dictation errors.   Lilia Pro., MD 11/01/19 1256

## 2019-11-02 ENCOUNTER — Other Ambulatory Visit: Payer: Self-pay

## 2019-11-02 ENCOUNTER — Ambulatory Visit: Admission: RE | Admit: 2019-11-02 | Payer: Medicare Other | Source: Ambulatory Visit

## 2019-11-02 ENCOUNTER — Encounter: Payer: Self-pay | Admitting: Oncology

## 2019-11-02 DIAGNOSIS — Q828 Other specified congenital malformations of skin: Secondary | ICD-10-CM | POA: Insufficient documentation

## 2019-11-02 NOTE — Progress Notes (Signed)
Patient prescreened for appointment. Patient has no concerns or questions.  

## 2019-11-03 ENCOUNTER — Inpatient Hospital Stay: Payer: Medicare Other | Admitting: Oncology

## 2019-11-03 DIAGNOSIS — C50312 Malignant neoplasm of lower-inner quadrant of left female breast: Secondary | ICD-10-CM

## 2019-11-03 DIAGNOSIS — C3411 Malignant neoplasm of upper lobe, right bronchus or lung: Secondary | ICD-10-CM

## 2019-11-04 ENCOUNTER — Ambulatory Visit: Payer: Medicare Other

## 2019-11-09 ENCOUNTER — Other Ambulatory Visit: Payer: Self-pay

## 2019-11-09 ENCOUNTER — Ambulatory Visit
Admission: RE | Admit: 2019-11-09 | Discharge: 2019-11-09 | Disposition: A | Discharge status: Home / Self Care | Payer: Medicare Other | Source: Ambulatory Visit | Attending: Oncology | Admitting: Oncology

## 2019-11-09 DIAGNOSIS — J9811 Atelectasis: Secondary | ICD-10-CM | POA: Diagnosis not present

## 2019-11-09 DIAGNOSIS — C3411 Malignant neoplasm of upper lobe, right bronchus or lung: Secondary | ICD-10-CM | POA: Diagnosis present

## 2019-11-09 LAB — GLUCOSE, CAPILLARY: Glucose-Capillary: 91 mg/dL (ref 70–99)

## 2019-11-09 MED ORDER — FLUDEOXYGLUCOSE F - 18 (FDG) INJECTION
5.5000 | Freq: Once | INTRAVENOUS | Status: AC | PRN
  Administered 2019-11-09: 6.008 via INTRAVENOUS

## 2019-11-28 ENCOUNTER — Emergency Department: Payer: Medicare Other

## 2019-11-28 ENCOUNTER — Encounter: Payer: Self-pay | Admitting: Emergency Medicine

## 2019-11-28 ENCOUNTER — Other Ambulatory Visit: Payer: Self-pay

## 2019-11-28 ENCOUNTER — Emergency Department
Admission: EM | Admit: 2019-11-28 | Discharge: 2019-11-28 | Disposition: A | Discharge status: Home / Self Care | Payer: Medicare Other | Attending: Emergency Medicine | Admitting: Emergency Medicine

## 2019-11-28 DIAGNOSIS — W010XXA Fall on same level from slipping, tripping and stumbling without subsequent striking against object, initial encounter: Secondary | ICD-10-CM | POA: Diagnosis not present

## 2019-11-28 DIAGNOSIS — S2241XA Multiple fractures of ribs, right side, initial encounter for closed fracture: Secondary | ICD-10-CM | POA: Insufficient documentation

## 2019-11-28 DIAGNOSIS — S99191A Other physeal fracture of right metatarsal, initial encounter for closed fracture: Secondary | ICD-10-CM | POA: Insufficient documentation

## 2019-11-28 DIAGNOSIS — Z7982 Long term (current) use of aspirin: Secondary | ICD-10-CM | POA: Diagnosis not present

## 2019-11-28 DIAGNOSIS — Z79899 Other long term (current) drug therapy: Secondary | ICD-10-CM | POA: Insufficient documentation

## 2019-11-28 DIAGNOSIS — Y999 Unspecified external cause status: Secondary | ICD-10-CM | POA: Diagnosis not present

## 2019-11-28 DIAGNOSIS — Z85118 Personal history of other malignant neoplasm of bronchus and lung: Secondary | ICD-10-CM | POA: Diagnosis not present

## 2019-11-28 DIAGNOSIS — F17200 Nicotine dependence, unspecified, uncomplicated: Secondary | ICD-10-CM | POA: Diagnosis not present

## 2019-11-28 DIAGNOSIS — Y92009 Unspecified place in unspecified non-institutional (private) residence as the place of occurrence of the external cause: Secondary | ICD-10-CM | POA: Insufficient documentation

## 2019-11-28 DIAGNOSIS — J449 Chronic obstructive pulmonary disease, unspecified: Secondary | ICD-10-CM | POA: Insufficient documentation

## 2019-11-28 DIAGNOSIS — Z853 Personal history of malignant neoplasm of breast: Secondary | ICD-10-CM | POA: Diagnosis not present

## 2019-11-28 DIAGNOSIS — S299XXA Unspecified injury of thorax, initial encounter: Secondary | ICD-10-CM | POA: Diagnosis present

## 2019-11-28 DIAGNOSIS — Y939 Activity, unspecified: Secondary | ICD-10-CM | POA: Insufficient documentation

## 2019-11-28 DIAGNOSIS — I2510 Atherosclerotic heart disease of native coronary artery without angina pectoris: Secondary | ICD-10-CM | POA: Insufficient documentation

## 2019-11-28 DIAGNOSIS — W19XXXA Unspecified fall, initial encounter: Secondary | ICD-10-CM

## 2019-11-28 MED ORDER — TRAMADOL HCL 50 MG PO TABS: 50.0000 mg | ORAL_TABLET | Freq: Two times a day (BID) | ORAL | 0 refills | Status: AC | PRN

## 2019-11-28 MED ORDER — TRAMADOL HCL 50 MG PO TABS
50.0000 mg | ORAL_TABLET | Freq: Once | ORAL | Status: AC
  Administered 2019-11-28: 50 mg via ORAL
  Filled 2019-11-28: qty 1

## 2019-11-28 MED ORDER — LIDOCAINE 5 % EX PTCH: 1.0000 | MEDICATED_PATCH | Freq: Two times a day (BID) | CUTANEOUS | 0 refills | Status: DC

## 2019-11-28 MED ORDER — LIDOCAINE 5 % EX PTCH
1.0000 | MEDICATED_PATCH | CUTANEOUS | Status: DC
  Administered 2019-11-28: 1 via TRANSDERMAL
  Filled 2019-11-28: qty 1

## 2019-11-28 NOTE — Discharge Instructions (Signed)
Follow discharge care instructions and take medication as directed. ambulate with postop shoe and walker until evaluation by podiatry.  Call today to schedule appointment.  Also advised to call family doctor and schedule visit for follow-up secondary to rib fractures.  Be advised tramadol may cause drowsiness.

## 2019-11-28 NOTE — ED Triage Notes (Signed)
Presents to ED via EMS s/p fall yesterday  Stats she stood up yesterday and her foot was asleep    Hit her head  No LOC  Also having pain to right anterior rib and right lateral foot

## 2019-11-28 NOTE — ED Provider Notes (Signed)
[facility] Emergency Department Provider Note   ____________________________________________   First MD Initiated Contact with Patient 11/28/19 0845     (approximate)  I have reviewed the triage vital signs and the nursing notes.   HISTORY  Chief Complaint Fall    HPI [name] [name] [facility] is a 86 y.o. female patient presents to ED via EMS secondary to to have her right anterior chest wall pain and right lateral foot pain.  Patient states she was unaware that her foot fell asleep and when she stood up she slipped and fell.  Patient states she hit the side of her head but did not have any LOC.  Patient denies vision disturbance or vertigo.  Patient with right anterior chest wall pain increased with deep breathing or cough.  Patient the pain to the right lateral foot increases with weightbearing and ambulation.  Patient rates the pain as a 6/10.  Patient described the pain as "achy".  No palliative measure for complaint.      Past Medical History:  Diagnosis Date  . Breast cancer ([place]) 08/19/2012   LT LUMPECTOMY, radiation tx.   Marland Kitchen COPD (chronic obstructive pulmonary disease) ([place])   . Hailey-hailey disease   . Hyperlipemia   . Hypertension   . Lung cancer ([place]) 2014   RT LUNG, radiation tx  . Migraine   . Myocardial infarction ([place])   . Osteoporosis   . Personal history of radiation therapy 2013   left breast ca  . Personal history of radiation therapy 2014   lung ca  . Radiation 2013   FOR BREAST CA  . Radiation 2014   FOR LUNG CA  . Renal artery stenosis [facility])     Patient Active Problem List   Diagnosis Date Noted  . 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 Acute kidney failure ([place]) 10/18/2019  . Proteinuria 10/18/2019  . Chronic left-sided thoracic back pain 09/08/2019  . Secondary hyperparathyroidism of renal origin ([place]) 01/26/2019  . Pain in toes of both feet 04/14/2018  . Bradycardia 08/14/2016  . Primary cancer of lower-inner  quadrant of left female breast ([place]) 07/17/2016  . Chronic low back pain 06/18/2016  . Combined fat and carbohydrate induced hyperlipemia 01/25/2016  . Myocardial infarction ([place]) 01/25/2016  . Current tobacco use 01/25/2016  . Other long term (current) drug therapy 01/17/2016  . Hyperkalemia 12/21/2015  . Dizziness 07/25/2015  . Immunizations incomplete 03/04/2015  . MI (mitral incompetence) 12/20/2014  . Simple chronic bronchitis ([place]) 12/15/2014  . Vasomotor rhinitis 12/15/2014  . Coronary artery disease of native artery of native heart with stable angina pectoris ([place]) 12/12/2014  . TI (tricuspid incompetence) 12/12/2014  . Breathlessness on exertion 12/12/2014  . Chronic kidney disease, stage 3 unspecified 06/15/2014  . Benign essential hypertension 06/15/2014  . Primary cancer of right upper lobe of lung ([place]) 06/15/2014  . Osteoporosis, post-menopausal 06/15/2014  . Peripheral vascular disease ([place]) 06/15/2014  . Renal artery stenosis ([place]) 06/15/2014  . History of left breast cancer 11/18/2011  . History of myocardial infarction 05/17/2001    Past Surgical History:  Procedure Laterality Date  . ABDOMINAL HYSTERECTOMY    . APPENDECTOMY    . BREAST BIOPSY Left 08/03/2012   invasive mammary carcinoma, u/s guided bx  . 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 cardiac stents    . PARATHYROIDECTOMY    . TONSILLECTOMY      Prior to Admission medications   Medication  Sig Start Date End Date Taking? Authorizing Provider  albuterol (PROAIR HFA) 108 (90 Base) MCG/ACT inhaler Inhale 2 puffs into the lungs every 6 (six) hours as needed for wheezing or shortness of breath.  08/15/15   [provider]  amLODipine (NORVASC) 2.5 MG tablet Take 2.5 mg by mouth daily.    [provider]  aspirin 81 MG EC tablet Take 81 mg by mouth daily.     [provider]  calcipotriene-betamethasone (TACLONEX)  ointment Apply topically. 06/22/19   [provider]  Calcium Carbonate-Vitamin D (CALCIUM-VITAMIN D) 500-200 MG-UNIT per tablet Take 1 tablet by mouth daily.    [provider]  calcium-vitamin D (OSCAL WITH D) 500-200 MG-UNIT TABS tablet Take by mouth.    [provider]  letrozole (FEMARA) 2.5 MG tablet Take by mouth.    [provider]  lidocaine (LIDODERM) 5 % Place 1 patch onto the skin every 12 (twelve) hours. Remove & Discard patch within 12 hours or as directed by MD 11/28/19 11/27/20  Sable Feil, PA-C  lisinopril (ZESTRIL) 2.5 MG tablet Take 1 tablet by mouth daily. 12/16/17   [provider]  metoprolol succinate (TOPROL-XL) 50 MG 24 hr tablet Take 50 mg by mouth daily. Take with or immediately following a meal.     [provider]  Multiple Vitamin (MULTIVITAMIN) tablet Take by mouth.    [provider]  Multiple Vitamins-Minerals (MULTIVITAMIN ADULT PO) Take 1 tablet by mouth.    [provider]  simvastatin (ZOCOR) 40 MG tablet Take 40 mg by mouth daily.    [provider]  traMADol (ULTRAM) 50 MG tablet Take 1 tablet (50 mg total) by mouth every 12 (twelve) hours as needed. 11/28/19   Sable Feil, PA-C    Allergies Bactrim [sulfamethoxazole-trimethoprim], Ibuprofen, Macrobid [nitrofurantoin monohyd macro], and Tape  Family History  Problem Relation Age of Onset  . Breast cancer Sister 67    Social History Social History   Tobacco Use  . Smoking status: Current Every Day Smoker    Packs/day: 1.00    Years: 70.00    Pack years: 70.00  . Smokeless tobacco: Never Used  Substance Use Topics  . Alcohol use: No  . Drug use: No    Review of Systems Constitutional: No fever/chills Eyes: No visual changes. ENT: No sore throat. Cardiovascular: Denies chest pain. Respiratory: Denies shortness of breath. Gastrointestinal: No abdominal pain.  No nausea, no vomiting.  No diarrhea.  No  constipation. Genitourinary: Negative for dysuria. Musculoskeletal: Right rib and foot pain. Skin: Negative for rash. Neurological: Negative for headaches, focal weakness or numbness. Endocrine:  Hyperlipidemia and hypertension. Allergic/Immunilogical: Bactrim DS, ibuprofen, Macrobid, and tape. ____________________________________________   PHYSICAL EXAM:  VITAL SIGNS: ED Triage Vitals  Enc Vitals Group     BP 11/28/19 0847 (!) 142/72     Pulse Rate 11/28/19 0847 (!) 52     Resp 11/28/19 0847 18     Temp 11/28/19 0847 98.7 F (37.1 C)     Temp Source 11/28/19 0847 Oral     SpO2 11/28/19 0847 98 %     Weight 11/28/19 0848 109 lb (49.4 kg)     Height 11/28/19 0848 5\' 3"  (1.6 m)     Head Circumference --      Peak Flow --      Pain Score 11/28/19 0847 6     Pain Loc --      Pain Edu? --  Excl. in [place]? --     Constitutional: Alert and oriented. Well appearing and in no acute distress. Eyes: Conjunctivae are normal. PERRL. EOMI. Head: Atraumatic. Nose: No congestion/rhinnorhea. Mouth/Throat: Mucous membranes are moist.  Oropharynx non-erythematous. Neck: No stridor.  No cervical spine tenderness to palpation. Hematological/Lymphatic/Immunilogical: No cervical lymphadenopathy. Cardiovascular: Normal rate, regular rhythm. Grossly normal heart sounds.  Good peripheral circulation. Respiratory: Splinting with respiratory effort.  No retractions. Lungs CTAB. Gastrointestinal: Soft and nontender. No distention. No abdominal bruits. No CVA tenderness. Genitourinary: Deferred Musculoskeletal: Edema but no obvious deformity to the right foot.  Moderate guarding palpation lateral right foot.   Neurologic:  Normal speech and language. No gross focal neurologic deficits are appreciated. No gait instability. Skin:  Skin is warm, dry and intact. No rash noted.  Edema to the lateral right foot. Psychiatric: Mood and affect are normal. Speech and behavior are  normal.  ____________________________________________   LABS (all labs ordered are listed, but only abnormal results are displayed)  Labs Reviewed - No data to display ____________________________________________  EKG   ____________________________________________  RADIOLOGY  ED MD interpretation:    Official radiology report(s): DG Ribs Unilateral W/Chest Right  Result Date: 11/28/2019 CLINICAL DATA:  Pain secondary to a fall. EXAM: RIGHT RIBS AND CHEST - 3+ VIEW COMPARISON:  11/01/2019 FINDINGS: Heart size is normal. Chronic atherosclerosis of the aorta. Aneurysm of the descending aorta as shown by previous CT. Left lower lobe lung mass as seen on studies of December appears unchanged. Scarring of the right mid lung appears the same. Mild patchy scarring at the right lung base appears the same. Healing, mildly displaced fracture of the right lateral third rib appears the same. Skin marker in place in the region of pain localizes the lower right lateral anterior ribs. There is a fracture of the anterior right eighth and ninth ribs. IMPRESSION: Acute fracture of the anterior right eighth and ninth ribs. No pneumothorax or hemothorax. Old healing fracture of the right third rib. Left lower lung mass as seen on studies of December. Thoracic aortic aneurysm. Electronically Signed   By: Nelson Chimes M.D.   On: 11/28/2019 09:36   DG Foot Complete Right  Result Date: 11/28/2019 CLINICAL DATA:  Right lateral foot pain after fall yesterday. EXAM: RIGHT FOOT COMPLETE - 3+ VIEW COMPARISON:  None. FINDINGS: Acute nondisplaced fracture of the base of the fifth metatarsal. Possible acute nondisplaced fracture of the lateral cuboid. No dislocation. Mild hallux valgus deformity with first MTP joint osteoarthritis. Mild soft tissue swelling along the lateral midfoot. IMPRESSION: 1. Acute nondisplaced fracture at the base of the fifth metatarsal. 2. Possible acute nondisplaced fracture of the lateral  cuboid. Electronically Signed   By: Titus Dubin M.D.   On: 11/28/2019 09:39    ____________________________________________   PROCEDURES  Procedure(s) performed (including Critical Care):  Procedures   ____________________________________________   INITIAL IMPRESSION / ASSESSMENT AND PLAN / ED COURSE  As part of my medical decision making, I reviewed the following data within the [place]     Patient presents with right anterior chest wall pain and right foot pain secondary to a fall yesterday.  Discussed x-ray findings with patient showed fracture of the eighth and ninth rib.  Patient also has a nondisplaced fifth metatarsal fracture of the right foot.  Patient given discharge care instruction.  Right foot was Ace wrapped and placed in open shoe.  Patient given a walker to assist with ambulation.  Patient advised follow-up PCP and  podiatry as discussed in discharge care instruction.    Janasia Coverdale Harkin was evaluated in Emergency Department on 11/28/2019 for the symptoms described in the history of present illness. She was evaluated in the context of the global COVID-19 pandemic, which necessitated consideration that the patient might be at risk for infection with the SARS-CoV-2 virus that causes COVID-19. Institutional protocols and algorithms that pertain to the evaluation of patients at risk for COVID-19 are in a state of rapid change based on information released by regulatory bodies including the CDC and federal and state organizations. These policies and algorithms were followed during the patient's care in the ED.       ____________________________________________   FINAL CLINICAL IMPRESSION(S) / ED DIAGNOSES  Final diagnoses:  Multiple fractures of ribs, right side, initial encounter for closed fracture  Other physeal fracture of right metatarsal, initial encounter for closed fracture  Fall in home, initial encounter     ED Discharge  Orders         Ordered    lidocaine (LIDODERM) 5 %  Every 12 hours     11/28/19 0958    traMADol (ULTRAM) 50 MG tablet  Every 12 hours PRN     11/28/19 0958           Note:  This document was prepared using Dragon voice recognition software and may include unintentional dictation errors.    Sable Feil, PA-C 11/28/19 1004    Earleen Newport, MD 11/28/19 [phone]

## 2019-11-28 NOTE — ED Notes (Signed)
Pt being transported to XR at this time.

## 2019-12-01 ENCOUNTER — Encounter (INDEPENDENT_AMBULATORY_CARE_PROVIDER_SITE_OTHER): Payer: Medicare Other

## 2019-12-22 DIAGNOSIS — R918 Other nonspecific abnormal finding of lung field: Secondary | ICD-10-CM | POA: Insufficient documentation

## 2019-12-30 ENCOUNTER — Encounter: Payer: Self-pay | Admitting: Oncology

## 2020-01-03 ENCOUNTER — Other Ambulatory Visit (INDEPENDENT_AMBULATORY_CARE_PROVIDER_SITE_OTHER): Payer: Self-pay | Admitting: Nephrology

## 2020-01-03 DIAGNOSIS — I701 Atherosclerosis of renal artery: Secondary | ICD-10-CM

## 2020-01-04 ENCOUNTER — Other Ambulatory Visit: Payer: Self-pay

## 2020-01-04 ENCOUNTER — Ambulatory Visit (INDEPENDENT_AMBULATORY_CARE_PROVIDER_SITE_OTHER): Payer: Medicare Other

## 2020-01-04 DIAGNOSIS — I701 Atherosclerosis of renal artery: Secondary | ICD-10-CM

## 2020-01-06 ENCOUNTER — Inpatient Hospital Stay: Payer: Medicare Other | Attending: Oncology | Admitting: Oncology

## 2020-01-06 ENCOUNTER — Encounter: Payer: Self-pay | Admitting: Oncology

## 2020-01-06 ENCOUNTER — Ambulatory Visit
Admission: RE | Admit: 2020-01-06 | Discharge: 2020-01-06 | Disposition: A | Discharge status: Home / Self Care | Payer: Medicare Other | Source: Ambulatory Visit | Attending: Radiation Oncology | Admitting: Radiation Oncology

## 2020-01-06 ENCOUNTER — Encounter: Payer: Self-pay | Admitting: Radiation Oncology

## 2020-01-06 ENCOUNTER — Other Ambulatory Visit: Payer: Self-pay

## 2020-01-06 VITALS — BP 148/90 | HR 100 | Temp 97.2°F | Resp 20 | Wt 106.0 lb

## 2020-01-06 VITALS — BP 165/91 | HR 108 | Temp 97.2°F | Resp 20 | Wt 106.8 lb

## 2020-01-06 DIAGNOSIS — M810 Age-related osteoporosis without current pathological fracture: Secondary | ICD-10-CM | POA: Diagnosis not present

## 2020-01-06 DIAGNOSIS — C3431 Malignant neoplasm of lower lobe, right bronchus or lung: Secondary | ICD-10-CM | POA: Diagnosis not present

## 2020-01-06 DIAGNOSIS — C7802 Secondary malignant neoplasm of left lung: Secondary | ICD-10-CM | POA: Insufficient documentation

## 2020-01-06 DIAGNOSIS — Z923 Personal history of irradiation: Secondary | ICD-10-CM | POA: Insufficient documentation

## 2020-01-06 DIAGNOSIS — Z853 Personal history of malignant neoplasm of breast: Secondary | ICD-10-CM | POA: Insufficient documentation

## 2020-01-06 DIAGNOSIS — R918 Other nonspecific abnormal finding of lung field: Secondary | ICD-10-CM | POA: Diagnosis not present

## 2020-01-06 DIAGNOSIS — Z90710 Acquired absence of both cervix and uterus: Secondary | ICD-10-CM | POA: Insufficient documentation

## 2020-01-06 DIAGNOSIS — Z85118 Personal history of other malignant neoplasm of bronchus and lung: Secondary | ICD-10-CM | POA: Insufficient documentation

## 2020-01-06 DIAGNOSIS — F17210 Nicotine dependence, cigarettes, uncomplicated: Secondary | ICD-10-CM | POA: Diagnosis not present

## 2020-01-06 MED ORDER — PROCHLORPERAZINE MALEATE 10 MG PO TABS: 10.0000 mg | ORAL_TABLET | Freq: Four times a day (QID) | ORAL | 2 refills | Status: DC | PRN

## 2020-01-06 MED ORDER — ONDANSETRON HCL 8 MG PO TABS: 8.0000 mg | ORAL_TABLET | Freq: Two times a day (BID) | ORAL | 1 refills | Status: AC | PRN

## 2020-01-06 NOTE — Progress Notes (Signed)
Radiation Oncology Follow up Note  Name: Stephanne Greeley [facility]   Date:   01/06/2020 MRN:  [id number] DOB: [birth year] 39  OP N/A old patient new area new left lung cancer   This 86 y.o. female presents to the clinic today for reevaluation of new.  Left lower lobe lung mass consistent with non-small cell lung cancer  REFERRING PROVIDER: Ezequiel Kayser, MD  HPI: Patient is a 86 year old female well-known to department she has previously received bilateral breast radiation as well as SBRT to right lower lobe for non-small cell lung cancer.  She also completed salvage radiation therapy to her chest after developing hypermetabolic activity in subcarinal lymph nodes back in August 2019.Marland Kitchen  She now has a new enlarging mass abutting the aorta in the left lower lobe.  PET CT scan was performed showing hypermetabolic to be consistent with non-small cell lung cancer no other evidence of metastatic disease or involved field of the chest was noted.  She is fairly asymptomatic although does have significant dyspnea on exertion.  She also has a mild cough clear no hemoptysis.  I have asked to evaluate her for possible radiation therapy.  COMPLICATIONS OF TREATMENT: none  FOLLOW UP COMPLIANCE: keeps appointments   PHYSICAL EXAM:  BP (!) 165/91 (BP Location: Left Arm, Patient Position: Sitting)   Pulse (!) 108   Temp (!) 97.2 F (36.2 C) (Tympanic)   Resp 20   Wt 106 lb 12.8 oz (48.4 kg)   BMI 18.92 kg/m  Well-developed well-nourished patient in NAD. HEENT reveals PERLA, EOMI, discs not visualized.  Oral cavity is clear. No oral mucosal lesions are identified. Neck is clear without evidence of cervical or supraclavicular adenopathy. Lungs are clear to A&P. Cardiac examination is essentially unremarkable with regular rate and rhythm without murmur rub or thrill. Abdomen is benign with no organomegaly or masses noted. Motor sensory and DTR levels are equal and symmetric in the upper and lower extremities.  Cranial nerves II through XII are grossly intact. Proprioception is intact. No peripheral adenopathy or edema is identified. No motor or sensory levels are noted. Crude visual fields are within normal range.  RADIOLOGY RESULTS: PET/CT reviewed compatible with above-stated findings  PLAN: At this time I like to go ahead with radiation therapy to her left lower lobe lesion.  I would plan on delivering 70 Gray over 7 weeks.  I discussed the case with Dr. Grayland Ormond who would do concurrent chemotherapy.  There will be extra effort by both professional staff as well as technical staff to coordinate and manage concurrent chemoradiation and ensuing side effects during her treatments.  Risks and benefits of treatment including possible development of worsening cough fatigue skin reaction alteration of blood counts all were discussed in detail with the patient and her daughter.  They both seem to comprehend my treatment plan well.  I first personally set up and ordered CT simulation for early next week.  We will coordinate her chemotherapy.  I would like to take this opportunity to thank you for allowing me to participate in the care of your patient.Noreene Filbert, MD

## 2020-01-06 NOTE — Progress Notes (Signed)
[place]  [phone] [phone]  ID: [name] [name] OB: [birth year]  MR#: [id number]  GTX#:[id number]  Patient Care Team: Ezequiel Kayser, MD as PCP - General (Internal Medicine) Lloyd Huger, MD as Medical Oncologist (Medical Oncology)   CHIEF COMPLAINT: Stage Ia ER positive, PR/HER-2 negative adenocarcinoma of the inner lower quadrant of the left breast, stage Ia adenocarcinoma of the right upper lobe lung.  Now with left lower lobe lung mass highly suspicious for malignancy.  INTERVAL HISTORY: Patient returns to clinic today for further evaluation and treatment planning.  She has noted increased dyspnea on exertion, but otherwise feels well. She does not complain of pain today. She has no neurologic complaints.  She denies any recent fevers or illnesses.  She denies any chest pain, shortness of breath, cough, or hemoptysis. She denies any nausea, vomiting, constipation, or diarrhea. She has no urinary complaints.  Patient offers no further specific complaints today.  REVIEW OF SYSTEMS:   Review of Systems  Constitutional: Negative.  Negative for fever, malaise/fatigue and weight loss.  Respiratory: Negative.  Negative for cough and shortness of breath.   Cardiovascular: Negative.  Negative for chest pain and leg swelling.  Gastrointestinal: Negative.  Negative for abdominal pain, blood in stool, constipation, diarrhea, nausea and vomiting.  Genitourinary: Negative.  Negative for flank pain.  Musculoskeletal: Negative for back pain.  Skin: Negative.  Negative for rash.  Neurological: Negative.  Negative for tingling, sensory change, focal weakness, weakness and headaches.  Psychiatric/Behavioral: Negative.  The patient is not nervous/anxious and does not have insomnia.     As per HPI. Otherwise, a complete review of systems is negative.  PAST MEDICAL HISTORY: Past Medical History:  Diagnosis Date  . Breast cancer ([place])  08/19/2012   LT LUMPECTOMY, radiation tx.   Marland Kitchen COPD (chronic obstructive pulmonary disease) ([place])   . Hailey-hailey disease   . Hyperlipemia   . Hypertension   . Lung cancer ([place]) 2014   RT LUNG, radiation tx  . Migraine   . Myocardial infarction ([place])   . Osteoporosis   . Personal history of radiation therapy 2013   left breast ca  . Personal history of radiation therapy 2014   lung ca  . Radiation 2013   FOR BREAST CA  . Radiation 2014   FOR LUNG CA  . Renal artery stenosis ([place])     PAST SURGICAL HISTORY: Past Surgical History:  Procedure Laterality Date  . ABDOMINAL HYSTERECTOMY    . APPENDECTOMY    . BREAST BIOPSY Left 08/03/2012   invasive mammary carcinoma, u/s guided bx  . 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 cardiac stents    . PARATHYROIDECTOMY    . TONSILLECTOMY      FAMILY HISTORY Family History  Problem Relation Age of Onset  . Breast cancer Sister 34       ADVANCED DIRECTIVES:    HEALTH MAINTENANCE: Social History   Tobacco Use  . Smoking status: Current Every Day Smoker    Packs/day: 1.00    Years: 70.00    Pack years: 70.00  . Smokeless tobacco: Never Used  Substance Use Topics  . Alcohol use: No  . Drug use: No      Allergies  Allergen Reactions  . Bactrim [Sulfamethoxazole-Trimethoprim] Other (See Comments)    Reduced kidney function.  Per Dr. Holley Raring should not take  . Ibuprofen     Reduced kidney function. Per  Dr. Holley Raring should not take  . Macrobid [employer] Macro] Other (See Comments)    Reduced Kidney function.  Should no longer take per Dr. Holley Raring  . Tape Rash    Current Outpatient Medications  Medication Sig Dispense Refill  . albuterol (PROAIR HFA) 108 (90 Base) MCG/ACT inhaler Inhale 2 puffs into the lungs every 6 (six) hours as needed for wheezing or shortness of breath.     Marland Kitchen amLODipine (NORVASC) 2.5 MG tablet Take 2.5 mg by mouth daily.      Marland Kitchen aspirin 81 MG EC tablet Take 81 mg by mouth daily.     . calcipotriene-betamethasone (TACLONEX) ointment Apply topically.    . Calcium Carbonate-Vitamin D (CALCIUM-VITAMIN D) 500-200 MG-UNIT per tablet Take 1 tablet by mouth daily.    . calcium-vitamin D (OSCAL WITH D) 500-200 MG-UNIT TABS tablet Take by mouth.    . letrozole (FEMARA) 2.5 MG tablet Take by mouth.    . lidocaine (LIDODERM) 5 % Place 1 patch onto the skin every 12 (twelve) hours. Remove & Discard patch within 12 hours or as directed by MD 10 patch 0  . lisinopril (ZESTRIL) 2.5 MG tablet Take 1 tablet by mouth daily.    . metoprolol succinate (TOPROL-XL) 25 MG 24 hr tablet Take 25 mg by mouth daily.    . Multiple Vitamin (MULTIVITAMIN) tablet Take by mouth.    . Multiple Vitamins-Minerals (MULTIVITAMIN ADULT PO) Take 1 tablet by mouth.    . simvastatin (ZOCOR) 20 MG tablet Take 20 mg by mouth at bedtime.    Marland Kitchen tiZANidine (ZANAFLEX) 2 MG tablet Take 1-2 mg by mouth daily as needed.    . traMADol (ULTRAM) 50 MG tablet Take 1 tablet (50 mg total) by mouth every 12 (twelve) hours as needed. 12 tablet 0   No current facility-administered medications for this visit.    OBJECTIVE: Vitals:   01/06/20 1105  BP: (!) 148/90  Pulse: 100  Resp: 20  Temp: (!) 97.2 F (36.2 C)  SpO2: 100%     Body mass index is 18.78 kg/m.    ECOG FS:0 - Asymptomatic  General: Thin, no acute distress. Eyes: Pink conjunctiva, anicteric sclera. HEENT: Normocephalic, moist mucous membranes. Lungs: No audible wheezing or coughing. Heart: Regular rate and rhythm. Abdomen: Soft, nontender, no obvious distention. Musculoskeletal: No edema, cyanosis, or clubbing. Neuro: Alert, answering all questions appropriately. Cranial nerves grossly intact. Skin: No rashes or petechiae noted. Psych: Normal affect.   LAB RESULTS:  Lab Results  Component Value Date   NA 137 11/01/2019   K 4.6 11/01/2019   CL 103 11/01/2019   CO2 23 11/01/2019   GLUCOSE  97 11/01/2019   BUN 23 11/01/2019   CREATININE 1.17 (H) 11/01/2019   CALCIUM 8.7 (L) 11/01/2019   PROT 7.5 11/01/2019   ALBUMIN 3.8 11/01/2019   AST 19 11/01/2019   ALT 12 11/01/2019   ALKPHOS 99 11/01/2019   BILITOT 0.7 11/01/2019   GFRNONAA 41 (L) 11/01/2019   GFRAA 48 (L) 11/01/2019    Lab Results  Component Value Date   WBC 9.6 11/01/2019   NEUTROABS 7.9 (H) 11/01/2019   HGB 13.4 11/01/2019   HCT 41.8 11/01/2019   MCV 93.3 11/01/2019   PLT 299 11/01/2019     STUDIES: No results found.  ASSESSMENT: Stage Ia ER positive, PR/HER-2 negative adenocarcinoma of the inner lower quadrant of the left breast, stage Ia adenocarcinoma of the right upper lobe lung.  Now with left lower lobe lung  mass highly suspicious for malignancy.  PLAN:    1.  Left lower lobe lung mass: Although patient's initial malignancy was in the right upper lobe, this is highly suspicious for recurrence.  Patient has declined biopsy to confirm diagnosis.  She had consultation with radiation oncology today and has agreed to pursue daily XRT along with concurrent weekly chemotherapy using carboplatinum and Taxol.  Given her advanced age, will discontinue chemotherapy if she had any significant side effects or a declining performance status.  Patient will return to clinic on January 20, 2020 to initiate cycle 1 of weekly carboplatinum and Taxol. 2.  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  PET scan results from Apr 02, 2018 revealed suspected recurrence in subcarinal lymph nodes.  She underwent XRT completing in August 2019.  Left lower lobe recurrence above is likely a metastasis and patient is declining further biopsies.  Will assess whether pathology from original biopsy in September 2000 14 can be sent for PD-L1. 3. Stage Ia ER positive, PR/HER-2 negative adenocarcinoma of the inner lower quadrant of the left breast: Given patient's advanced age and low stage  of disease, she did not require adjuvant chemotherapy.  Oncotype testing was not performed as this would not change the treatment plan.  Patient completed 5 years of letrozole in November 2019.  Her most recent mammogram on October 31, 2019 was reported BI-RADS 1.  Repeat in 1 year. 4.  Osteoporosis: Patient's most recent bone mineral density on October 31, 2019 reported T score of -3.1 which is unchanged from 1 year prior. Patient last received Prolia on October 06, 2019.     Patient expressed understanding and was in agreement with this plan. She also understands that She can call clinic at any time with any questions, concerns, or complaints.    Lloyd Huger, MD 01/06/20 3:26 PM

## 2020-01-06 NOTE — Progress Notes (Signed)
START OFF PATHWAY REGIMEN - Non-Small Cell Lung   [id number]:Carboplatin AUC=2 + Paclitaxel 45 mg/m2 Weekly:   Administer weekly:     Paclitaxel      Carboplatin   **Always confirm dose/schedule in your pharmacy ordering system**  Patient Characteristics: Local Recurrence Therapeutic Status: Local Recurrence Intent of Therapy: Non-Curative / Palliative Intent, Discussed with Patient

## 2020-01-09 NOTE — Patient Instructions (Signed)
Paclitaxel injection What is this medicine? PACLITAXEL (PAK li TAX el) is a chemotherapy drug. It targets fast dividing cells, like cancer cells, and causes these cells to die. This medicine is used to treat ovarian cancer, breast cancer, lung cancer, Kaposi's sarcoma, and other cancers. This medicine may be used for other purposes; ask your health care provider or pharmacist if you have questions. COMMON BRAND NAME(S): Onxol, Taxol What should I tell my health care provider before I take this medicine? They need to know if you have any of these conditions:  history of irregular heartbeat  liver disease  low blood counts, like low white cell, platelet, or red cell counts  lung or breathing disease, like asthma  tingling of the fingers or toes, or other nerve disorder  an unusual or allergic reaction to paclitaxel, alcohol, polyoxyethylated castor oil, other chemotherapy, other medicines, foods, dyes, or preservatives  pregnant or trying to get pregnant  breast-feeding How should I use this medicine? This drug is given as an infusion into a vein. It is administered in a hospital or clinic by a specially trained health care professional. Talk to your pediatrician regarding the use of this medicine in children. Special care may be needed. Overdosage: If you think you have taken too much of this medicine contact a poison control center or emergency room at once. NOTE: This medicine is only for you. Do not share this medicine with others. What if I miss a dose? It is important not to miss your dose. Call your doctor or health care professional if you are unable to keep an appointment. What may interact with this medicine? Do not take this medicine with any of the following medications:  disulfiram  metronidazole This medicine may also interact with the following medications:  antiviral medicines for hepatitis, HIV or AIDS  certain antibiotics like erythromycin and  clarithromycin  certain medicines for fungal infections like ketoconazole and itraconazole  certain medicines for seizures like carbamazepine, phenobarbital, phenytoin  gemfibrozil  nefazodone  rifampin  St. John's wort This list may not describe all possible interactions. Give your health care provider a list of all the medicines, herbs, non-prescription drugs, or dietary supplements you use. Also tell them if you smoke, drink alcohol, or use illegal drugs. Some items may interact with your medicine. What should I watch for while using this medicine? Your condition will be monitored carefully while you are receiving this medicine. You will need important blood work done while you are taking this medicine. This medicine can cause serious allergic reactions. To reduce your risk you will need to take other medicine(s) before treatment with this medicine. If you experience allergic reactions like skin rash, itching or hives, swelling of the face, lips, or tongue, tell your doctor or health care professional right away. In some cases, you may be given additional medicines to help with side effects. Follow all directions for their use. This drug may make you feel generally unwell. This is not uncommon, as chemotherapy can affect healthy cells as well as cancer cells. Report any side effects. Continue your course of treatment even though you feel ill unless your doctor tells you to stop. Call your doctor or health care professional for advice if you get a fever, chills or sore throat, or other symptoms of a cold or flu. Do not treat yourself. This drug decreases your body's ability to fight infections. Try to avoid being around people who are sick. This medicine may increase your risk to bruise  or bleed. Call your doctor or health care professional if you notice any unusual bleeding. Be careful brushing and flossing your teeth or using a toothpick because you may get an infection or bleed more easily.  If you have any dental work done, tell your dentist you are receiving this medicine. Avoid taking products that contain aspirin, acetaminophen, ibuprofen, naproxen, or ketoprofen unless instructed by your doctor. These medicines may hide a fever. Do not become pregnant while taking this medicine. Women should inform their doctor if they wish to become pregnant or think they might be pregnant. There is a potential for serious side effects to an unborn child. Talk to your health care professional or pharmacist for more information. Do not breast-feed an infant while taking this medicine. Men are advised not to father a child while receiving this medicine. This product may contain alcohol. Ask your pharmacist or healthcare provider if this medicine contains alcohol. Be sure to tell all healthcare providers you are taking this medicine. Certain medicines, like metronidazole and disulfiram, can cause an unpleasant reaction when taken with alcohol. The reaction includes flushing, headache, nausea, vomiting, sweating, and increased thirst. The reaction can last from 30 minutes to several hours. What side effects may I notice from receiving this medicine? Side effects that you should report to your doctor or health care professional as soon as possible:  allergic reactions like skin rash, itching or hives, swelling of the face, lips, or tongue  breathing problems  changes in vision  fast, irregular heartbeat  high or low blood pressure  mouth sores  pain, tingling, numbness in the hands or feet  signs of decreased platelets or bleeding - bruising, pinpoint red spots on the skin, black, tarry stools, blood in the urine  signs of decreased red blood cells - unusually weak or tired, feeling faint or lightheaded, falls  signs of infection - fever or chills, cough, sore throat, pain or difficulty passing urine  signs and symptoms of liver injury like dark yellow or brown urine; general ill feeling or  flu-like symptoms; light-colored stools; loss of appetite; nausea; right upper belly pain; unusually weak or tired; yellowing of the eyes or skin  swelling of the ankles, feet, hands  unusually slow heartbeat Side effects that usually do not require medical attention (report to your doctor or health care professional if they continue or are bothersome):  diarrhea  hair loss  loss of appetite  muscle or joint pain  nausea, vomiting  pain, redness, or irritation at site where injected  tiredness This list may not describe all possible side effects. Call your doctor for medical advice about side effects. You may report side effects to FDA at 1-800-FDA-1088. Where should I keep my medicine? This drug is given in a hospital or clinic and will not be stored at home. NOTE: This sheet is a summary. It may not cover all possible information. If you have questions about this medicine, talk to your doctor, pharmacist, or health care provider.  2020 [employer] Standard (2017-07-07 13:14:55) Carboplatin injection What is this medicine? CARBOPLATIN (KAR boe pla tin) is a chemotherapy drug. It targets fast dividing cells, like cancer cells, and causes these cells to die. This medicine is used to treat ovarian cancer and many other cancers. This medicine may be used for other purposes; ask your health care provider or pharmacist if you have questions. COMMON BRAND NAME(S): Paraplatin What should I tell my health care provider before I take this medicine? They need to  know if you have any of these conditions:  blood disorders  hearing problems  kidney disease  recent or ongoing radiation therapy  an unusual or allergic reaction to carboplatin, cisplatin, other chemotherapy, other medicines, foods, dyes, or preservatives  pregnant or trying to get pregnant  breast-feeding How should I use this medicine? This drug is usually given as an infusion into a vein. It is administered in a  hospital or clinic by a specially trained health care professional. Talk to your pediatrician regarding the use of this medicine in children. Special care may be needed. Overdosage: If you think you have taken too much of this medicine contact a poison control center or emergency room at once. NOTE: This medicine is only for you. Do not share this medicine with others. What if I miss a dose? It is important not to miss a dose. Call your doctor or health care professional if you are unable to keep an appointment. What may interact with this medicine?  medicines for seizures  medicines to increase blood counts like filgrastim, pegfilgrastim, sargramostim  some antibiotics like amikacin, gentamicin, neomycin, streptomycin, tobramycin  vaccines Talk to your doctor or health care professional before taking any of these medicines:  acetaminophen  aspirin  ibuprofen  ketoprofen  naproxen This list may not describe all possible interactions. Give your health care provider a list of all the medicines, herbs, non-prescription drugs, or dietary supplements you use. Also tell them if you smoke, drink alcohol, or use illegal drugs. Some items may interact with your medicine. What should I watch for while using this medicine? Your condition will be monitored carefully while you are receiving this medicine. You will need important blood work done while you are taking this medicine. This drug may make you feel generally unwell. This is not uncommon, as chemotherapy can affect healthy cells as well as cancer cells. Report any side effects. Continue your course of treatment even though you feel ill unless your doctor tells you to stop. In some cases, you may be given additional medicines to help with side effects. Follow all directions for their use. Call your doctor or health care professional for advice if you get a fever, chills or sore throat, or other symptoms of a cold or flu. Do not treat  yourself. This drug decreases your body's ability to fight infections. Try to avoid being around people who are sick. This medicine may increase your risk to bruise or bleed. Call your doctor or health care professional if you notice any unusual bleeding. Be careful brushing and flossing your teeth or using a toothpick because you may get an infection or bleed more easily. If you have any dental work done, tell your dentist you are receiving this medicine. Avoid taking products that contain aspirin, acetaminophen, ibuprofen, naproxen, or ketoprofen unless instructed by your doctor. These medicines may hide a fever. Do not become pregnant while taking this medicine. Women should inform their doctor if they wish to become pregnant or think they might be pregnant. There is a potential for serious side effects to an unborn child. Talk to your health care professional or pharmacist for more information. Do not breast-feed an infant while taking this medicine. What side effects may I notice from receiving this medicine? Side effects that you should report to your doctor or health care professional as soon as possible:  allergic reactions like skin rash, itching or hives, swelling of the face, lips, or tongue  signs of infection - fever or  chills, cough, sore throat, pain or difficulty passing urine  signs of decreased platelets or bleeding - bruising, pinpoint red spots on the skin, black, tarry stools, nosebleeds  signs of decreased red blood cells - unusually weak or tired, fainting spells, lightheadedness  breathing problems  changes in hearing  changes in vision  chest pain  high blood pressure  low blood counts - This drug may decrease the number of white blood cells, red blood cells and platelets. You may be at increased risk for infections and bleeding.  nausea and vomiting  pain, swelling, redness or irritation at the injection site  pain, tingling, numbness in the hands or  feet  problems with balance, talking, walking  trouble passing urine or change in the amount of urine Side effects that usually do not require medical attention (report to your doctor or health care professional if they continue or are bothersome):  hair loss  loss of appetite  metallic taste in the mouth or changes in taste This list may not describe all possible side effects. Call your doctor for medical advice about side effects. You may report side effects to FDA at 1-800-FDA-1088. Where should I keep my medicine? This drug is given in a hospital or clinic and will not be stored at home. NOTE: This sheet is a summary. It may not cover all possible information. If you have questions about this medicine, talk to your doctor, pharmacist, or health care provider.  2020 [employer] Standard (2008-02-08 14:38:05)

## 2020-01-10 ENCOUNTER — Inpatient Hospital Stay: Payer: Medicare Other

## 2020-01-10 ENCOUNTER — Inpatient Hospital Stay (HOSPITAL_BASED_OUTPATIENT_CLINIC_OR_DEPARTMENT_OTHER): Payer: Medicare Other | Admitting: Oncology

## 2020-01-10 ENCOUNTER — Other Ambulatory Visit: Payer: Self-pay

## 2020-01-10 ENCOUNTER — Ambulatory Visit
Admission: RE | Admit: 2020-01-10 | Discharge: 2020-01-10 | Disposition: A | Discharge status: Home / Self Care | Payer: Medicare Other | Source: Ambulatory Visit | Attending: Radiation Oncology | Admitting: Radiation Oncology

## 2020-01-10 ENCOUNTER — Inpatient Hospital Stay: Payer: Medicare Other | Admitting: Oncology

## 2020-01-10 DIAGNOSIS — C3411 Malignant neoplasm of upper lobe, right bronchus or lung: Secondary | ICD-10-CM

## 2020-01-10 DIAGNOSIS — C3432 Malignant neoplasm of lower lobe, left bronchus or lung: Secondary | ICD-10-CM | POA: Diagnosis not present

## 2020-01-10 DIAGNOSIS — R918 Other nonspecific abnormal finding of lung field: Secondary | ICD-10-CM

## 2020-01-10 LAB — CBC WITH DIFFERENTIAL/PLATELET
Abs Immature Granulocytes: 0.02 10*3/uL (ref 0.00–0.07)
Basophils Absolute: 0 10*3/uL (ref 0.0–0.1)
Basophils Relative: 1 %
Eosinophils Absolute: 0.2 10*3/uL (ref 0.0–0.5)
Eosinophils Relative: 2 %
HCT: 41.4 % (ref 36.0–46.0)
Hemoglobin: 12.6 g/dL (ref 12.0–15.0)
Immature Granulocytes: 0 %
Lymphocytes Relative: 15 %
Lymphs Abs: 1.2 10*3/uL (ref 0.7–4.0)
MCH: 28.7 pg (ref 26.0–34.0)
MCHC: 30.4 g/dL (ref 30.0–36.0)
MCV: 94.3 fL (ref 80.0–100.0)
Monocytes Absolute: 0.7 10*3/uL (ref 0.1–1.0)
Monocytes Relative: 9 %
Neutro Abs: 5.7 10*3/uL (ref 1.7–7.7)
Neutrophils Relative %: 73 %
Platelets: 320 10*3/uL (ref 150–400)
RBC: 4.39 MIL/uL (ref 3.87–5.11)
RDW: 14 % (ref 11.5–15.5)
WBC: 7.8 10*3/uL (ref 4.0–10.5)
nRBC: 0 % (ref 0.0–0.2)

## 2020-01-10 LAB — COMPREHENSIVE METABOLIC PANEL
ALT: 14 U/L (ref 0–44)
AST: 22 U/L (ref 15–41)
Albumin: 3.9 g/dL (ref 3.5–5.0)
Alkaline Phosphatase: 93 U/L (ref 38–126)
Anion gap: 9 (ref 5–15)
BUN: 36 mg/dL — ABNORMAL HIGH (ref 8–23)
CO2: 26 mmol/L (ref 22–32)
Calcium: 9.1 mg/dL (ref 8.9–10.3)
Chloride: 104 mmol/L (ref 98–111)
Creatinine, Ser: 1.08 mg/dL — ABNORMAL HIGH (ref 0.44–1.00)
GFR calc Af Amer: 53 mL/min — ABNORMAL LOW (ref >60)
GFR calc non Af Amer: 45 mL/min — ABNORMAL LOW (ref >60)
Glucose, Bld: 95 mg/dL (ref 70–99)
Potassium: 4.5 mmol/L (ref 3.5–5.1)
Sodium: 139 mmol/L (ref 135–145)
Total Bilirubin: 0.4 mg/dL (ref 0.3–1.2)
Total Protein: 7.8 g/dL (ref 6.5–8.1)

## 2020-01-11 NOTE — Progress Notes (Addendum)
[place]  [phone] [phone]  Patient Care Team: Ezequiel Kayser, MD as PCP - General (Internal Medicine) Lloyd Huger, MD as Medical Oncologist (Medical Oncology)   Name of the patient: [name] [name]  [id number]  10/12/1928   Date of visit: 01/11/20  Diagnosis- Breast Cancer   Chief complaint/Reason for visit- Initial Meeting for [facility], preparing for starting chemotherapy  I connected with Galilee Pierron Adger on 01/20/20 at  8:30 AM EST by telephone visit and verified that I am speaking with the correct person using two identifiers.   I discussed the limitations, risks, security and privacy concerns of performing an evaluation and management service by telemedicine and the availability of in-person appointments. I also discussed with the patient that there may be a patient responsible charge related to this service. The patient expressed understanding and agreed to proceed.   Other persons participating in the visit and their role in the encounter: None  Patients location: Home Providers location: Office  Heme/Onc history:  Oncology History   No history exists.   Interval history-[name] [name] is a 86 year old female who presents to chemo care clinic today for initial meeting in preparation for starting chemotherapy. I introduced the chemo care clinic and we discussed that the role of the clinic is to assist those who are at an increased risk of emergency room visits and/or complications during the course of chemotherapy treatment. We discussed that the increased risk takes into account factors such as age, performance status, and co-morbidities. We also discussed that for some, this might include barriers to care such as not having a primary care provider, lack of insurance/transportation, or not being able to afford medications. We discussed that the goal of the program is  to help prevent unplanned ER visits and help reduce complications during chemotherapy. We do this by discussing specific risk factors to each individual and identifying ways that we can help improve these risk factors and reduce barriers to care.   ECOG FS:0 - Asymptomatic  Review of systems- Review of Systems  Constitutional: Negative.  Negative for chills, fever, malaise/fatigue and weight loss.  HENT: Negative for congestion, ear pain and tinnitus.   Eyes: Negative.  Negative for blurred vision and double vision.  Respiratory: Negative.  Negative for cough, sputum production and shortness of breath.   Cardiovascular: Negative.  Negative for chest pain, palpitations and leg swelling.  Gastrointestinal: Negative.  Negative for abdominal pain, constipation, diarrhea, nausea and vomiting.  Genitourinary: Negative for dysuria, frequency and urgency.  Musculoskeletal: Negative for back pain and falls.  Skin: Negative.  Negative for rash.  Neurological: Negative.  Negative for weakness and headaches.  Endo/Heme/Allergies: Negative.  Does not bruise/bleed easily.  Psychiatric/Behavioral: Negative.  Negative for depression. The patient is not nervous/anxious and does not have insomnia.      Current treatment-will begin carbo/Taxol next week  Allergies  Allergen Reactions   Bactrim [Sulfamethoxazole-Trimethoprim] Other (See Comments)    Reduced kidney function.  Per Dr. Holley Raring should not take   Ibuprofen     Reduced kidney function. Per Dr. Holley Raring should not take   [facility] Macro] Other (See Comments)    Reduced Kidney function.  Should no longer take per Dr. Holley Raring   Tape Rash    Past Medical History:  Diagnosis Date   Breast cancer ([place]) 08/19/2012   LT LUMPECTOMY, radiation tx.    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    Hyperlipemia    Hypertension    Lung cancer ([place]) 2014   RT LUNG, radiation tx    Migraine    Myocardial infarction [facility])    Osteoporosis    Personal history of radiation therapy 2013   left breast ca   Personal history of radiation therapy 2014   lung ca   Radiation 2013   FOR BREAST CA   Radiation 2014   FOR LUNG CA   Renal artery stenosis [facility])     Past Surgical History:  Procedure Laterality Date   ABDOMINAL HYSTERECTOMY     APPENDECTOMY     BREAST BIOPSY Left 08/03/2012   invasive mammary carcinoma, u/s guided bx   BREAST LUMPECTOMY Left 2013   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   cardiac stents     PARATHYROIDECTOMY     TONSILLECTOMY      Social History   Socioeconomic History   Marital status: Widowed    Spouse name: Not on file   Number of children: Not on file   Years of education: Not on file   Highest education level: Not on file  Occupational History   Not on file  Tobacco Use   Smoking status: Current Every Day Smoker    Packs/day: 1.00    Years: 70.00    Pack years: 70.00   Smokeless tobacco: Never Used  Substance and Sexual Activity   Alcohol use: No   Drug use: No   Sexual activity: Never  Other Topics Concern   Not on file  Social History Narrative   Not on file   Social Determinants of Health   Financial Resource Strain:    Difficulty of Paying Living Expenses: Not on file  Food Insecurity:    Worried About [place] in the Last Year: Not on file   Ran Out of Food in the Last Year: Not on file  Transportation Needs:    Lack of Transportation (Medical): Not on file   Lack of Transportation (Non-Medical): Not on file  Physical Activity:    Days of Exercise per Week: Not on file   Minutes of Exercise per Session: Not on file  Stress:    Feeling of Stress : Not on file  Social Connections:    Frequency of Communication with Friends and Family: Not on file   Frequency of Social Gatherings with Friends and Family: Not on file   Attends  Religious Services: Not on file   Active Member of [place] or Organizations: Not on file   Attends Archivist Meetings: Not on file   Marital Status: Not on file  Intimate Partner Violence:    Fear of Current or Ex-Partner: Not on file   Emotionally Abused: Not on file   Physically Abused: Not on file   Sexually Abused: Not on file    Family History  Problem Relation Age of Onset   Breast cancer Sister 94     Current Outpatient Medications:    albuterol (PROAIR HFA) 108 (90 Base) MCG/ACT inhaler, Inhale 2 puffs into the lungs every 6 (six) hours as needed for wheezing or shortness of breath. , Disp: , Rfl:    amLODipine (NORVASC) 2.5 MG tablet, Take 2.5 mg by mouth daily., Disp: , Rfl:    aspirin 81 MG EC tablet, Take 81 mg by mouth daily. , Disp: , Rfl:    calcipotriene-betamethasone (TACLONEX) ointment, Apply topically., Disp: , Rfl:  Calcium Carbonate-Vitamin D (CALCIUM-VITAMIN D) 500-200 MG-UNIT per tablet, Take 1 tablet by mouth daily., Disp: , Rfl:    calcium-vitamin D (OSCAL WITH D) 500-200 MG-UNIT TABS tablet, Take by mouth., Disp: , Rfl:    letrozole (FEMARA) 2.5 MG tablet, Take by mouth., Disp: , Rfl:    lidocaine (LIDODERM) 5 %, Place 1 patch onto the skin every 12 (twelve) hours. Remove & Discard patch within 12 hours or as directed by MD, Disp: 10 patch, Rfl: 0   lisinopril (ZESTRIL) 2.5 MG tablet, Take 1 tablet by mouth daily., Disp: , Rfl:    metoprolol succinate (TOPROL-XL) 25 MG 24 hr tablet, Take 25 mg by mouth daily., Disp: , Rfl:    Multiple Vitamin (MULTIVITAMIN) tablet, Take by mouth., Disp: , Rfl:    Multiple Vitamins-Minerals (MULTIVITAMIN ADULT PO), Take 1 tablet by mouth., Disp: , Rfl:    ondansetron (ZOFRAN) 8 MG tablet, Take 1 tablet (8 mg total) by mouth 2 (two) times daily as needed for refractory nausea / vomiting., Disp: 60 tablet, Rfl: 1   prochlorperazine (COMPAZINE) 10 MG tablet, Take 1 tablet (10 mg total) by mouth  every 6 (six) hours as needed (Nausea or vomiting)., Disp: 60 tablet, Rfl: 2   simvastatin (ZOCOR) 20 MG tablet, Take 20 mg by mouth at bedtime., Disp: , Rfl:    tiZANidine (ZANAFLEX) 2 MG tablet, Take 1-2 mg by mouth daily as needed., Disp: , Rfl:    traMADol (ULTRAM) 50 MG tablet, Take 1 tablet (50 mg total) by mouth every 12 (twelve) hours as needed., Disp: 12 tablet, Rfl: 0  Physical exam: There were no vitals filed for this visit. Limited d/t virtual platform   CMP Latest Ref Rng & Units 01/10/2020  Glucose 70 - 99 mg/dL 95  BUN 8 - 23 mg/dL 36(H)  Creatinine 0.44 - 1.00 mg/dL 1.08(H)  Sodium 135 - 145 mmol/L 139  Potassium 3.5 - 5.1 mmol/L 4.5  Chloride 98 - 111 mmol/L 104  CO2 22 - 32 mmol/L 26  Calcium 8.9 - 10.3 mg/dL 9.1  Total Protein 6.5 - 8.1 g/dL 7.8  Total Bilirubin 0.3 - 1.2 mg/dL 0.4  Alkaline Phos 38 - 126 U/L 93  AST 15 - 41 U/L 22  ALT 0 - 44 U/L 14   CBC Latest Ref Rng & Units 01/10/2020  WBC 4.0 - 10.5 K/uL 7.8  Hemoglobin 12.0 - 15.0 g/dL 12.6  Hematocrit 36.0 - 46.0 % 41.4  Platelets 150 - 400 K/uL 320    No images are attached to the encounter.  VAS US RENAL ARTERY DUPLEX  Result Date: 01/09/2020 ABDOMINAL VISCERAL Indications: Hypertension Comparison Study: 11/22/2018 Performing Technologist: Almira Coaster RVS  Examination Guidelines: A complete evaluation includes B-mode imaging, spectral Doppler, color Doppler, and power Doppler as needed of all accessible portions of each vessel. Bilateral testing is considered an integral part of a complete examination. Limited examinations for reoccurring indications may be performed as noted.  Duplex Findings: +------------+--------+--------+------+--------+  Mesenteric   PSV cm/s EDV cm/s Plaque Comments  +------------+--------+--------+------+--------+  Aorta Prox      79       22                     +------------+--------+--------+------+--------+  Aorta Mid       83       24                      +------------+--------+--------+------+--------+  Aorta Distal    36       10                     +------------+--------+--------+------+--------+  +------------------+--------+--------+-------+  Right Renal Artery PSV cm/s EDV cm/s Comment  +------------------+--------+--------+-------+  Origin               117       42             +------------------+--------+--------+-------+  Proximal              93       25             +------------------+--------+--------+-------+  Mid                   76       18             +------------------+--------+--------+-------+  Distal                72       26             +------------------+--------+--------+-------+ +-----------------+--------+--------+-------+  Left Renal Artery PSV cm/s EDV cm/s Comment  +-----------------+--------+--------+-------+  Origin               71       17             +-----------------+--------+--------+-------+  Proximal             69       15             +-----------------+--------+--------+-------+  Mid                  49       13             +-----------------+--------+--------+-------+  Distal               58       14             +-----------------+--------+--------+-------+  +------------------+----+------------------+----+  Right Kidney            Left Kidney              +------------------+----+------------------+----+  RAR (manual)       .71  RAR (manual)       1.17  +------------------+----+------------------+----+  Kidney length (cm) 9.66 Kidney length (cm) 8.40  +------------------+----+------------------+----+  Summary: Largest Aortic Diameter: 3.3 cm  Renal:  Right: No evidence of right renal artery stenosis. Normal size right        kidney. RRV flow present. Left:  No evidence of left renal artery stenosis. Abnormal size for        the left kidney. LRV flow present.         The bilateral kidney diameters appear decreased when compared        to the previous exam on 11/22/18. Known distal AAA noted when        compared to the  previous CT on 11/01/19.  *See table(s) above for measurements and observations.  Diagnosing physician: Hortencia Pilar MD Electronically signed by Hortencia Pilar MD on 01/09/2020 at 1:49:18 PM.    Final      Assessment and plan- Patient is a 86 y.o. female who presents to [facility] for initial meeting in preparation for starting chemotherapy for the treatment of breast cancer.   1. 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  She recently returned back with left lower lobe lung mass highly suspicious for malignancy.  Patient declined biopsy to confirm diagnosis.  She has met with radiation oncology and is agreed to pursue daily XRT along with concurrent weekly chemo.  She is scheduled to return to clinic on 01/20/2020 to initiate cycle 1 of carbo/Taxol.  She was treated for stage I adenocarcinoma of right upper lobe with XRT in 2014.  PET scan from May 2019 revealed suspected recurrence in lymph nodes.  She had XRT completed in August 2019.  Left lower lobe recurrence above is likely a metastasis and patient is declining further biopsies.  For her stage I ER positive PR HER-2 negative adenocarcinoma of the inner lower quadrant of the left breast, she completed 5 years of letrozole in November 2019.  Most recent mammogram from December 2020 was reported as BI-RADS 1 with repeat in 1 year.  2. Chemo Care Clinic/High Risk for ER/Hospitalization during chemotherapy- We discussed the role of the chemo care clinic and identified patient specific risk factors. I discussed that patient was identified as high risk primarily based on: Age and comorbidities.  Patient has past medical history positive for: Past Medical History:  Diagnosis Date   Breast cancer ([place]) 08/19/2012   LT LUMPECTOMY, radiation tx.    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      Hyperlipemia    Hypertension    Lung cancer ([place]) 2014   RT LUNG, radiation tx   Migraine    Myocardial infarction [facility])    Osteoporosis    Personal history of radiation therapy 2013   left breast ca   Personal history of radiation therapy 2014   lung ca   Radiation 2013   FOR BREAST CA   Radiation 2014   FOR LUNG CA   Renal artery stenosis ([place])     Patient has past surgical history positive for: Past Surgical History:  Procedure Laterality Date   ABDOMINAL HYSTERECTOMY     APPENDECTOMY     BREAST BIOPSY Left 08/03/2012   invasive mammary carcinoma, u/s guided bx   BREAST LUMPECTOMY Left 2013   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  The percentage below indicates how "at risk "  this patient based on the factors in this table within one year.   General Risk Score: 5  Values used to calculate this score:   Points  Metrics      2        Age: [facility] Admissions: 0      3        ED Visits: 4      0        Has Chronic Obstructive Pulmonary Disease: No      0        Has Diabetes: No      0        Has Congestive Heart Failure: No      0        Has liver disease: No  0        Has Depression: No      0        Current PCP: Ezequiel Kayser, MD      0        Has Medicaid: No  3. We discussed that social determinants of health may have significant impacts on health and outcomes for cancer patients.  Today we discussed specific social determinants of performance status, alcohol use, depression, financial needs, food insecurity, housing, interpersonal violence, social connections, stress, tobacco use, and transportation.    After lengthy discussion the following were identified as areas of need: None at this time.  Patient lives with her daughter and has  everything that she needs.  She states she will let us now if she can think of anything in the future.  Outpatient services: We discussed options including home based and outpatient services, DME and care program. We discusssed that patients who participate in regular physical activity report fewer negative impacts of cancer and treatments and report less fatigue.   Financial Concerns: We discussed that living with cancer can create tremendous financial burden.  We discussed options for assistance. I asked that if assistance is needed in affording medications or paying bills to please let us know so that we can provide assistance. We discussed options for food including social services.  We will also notify [facility] to see if cancer center can provide support.  Referral to Social work: Introduced Education officer, museum Elease Etienne and the services he can provide such as support with utility bill, cell phone and gas vouchers.   Support groups: We discussed options for support groups at the cancer center. If interested, please notify nurse navigator to enroll. We discussed options for managing stress including healthy eating, exercise as well as participating in no charge counseling services at the cancer center and support groups.  If these are of interest, patient can notify either myself or primary nursing team.We discussed options for management including medications and referral to quit Smart program  Transportation: We discussed options for transportation including acta, paratransit, bus routes, link transit, taxi/uber/lyft, and cancer center [place].  I have notified primary oncology team who will help assist with arranging Lucianne Lei transportation for appointments when/if needed. We also discussed options for transportation on short notice/acute visits.  Palliative care services: We have palliative care services available in the cancer center to discuss goals of care and advanced care planning.  Please let us know  if you have any questions or would like to speak to our palliative nurse practitioner.  Symptom Management Clinic: We discussed our symptom management clinic which is available for acute concerns while receiving treatment such as nausea, vomiting or diarrhea.  We can be reached via telephone at [phone] or through my chart.  We are available for virtual or in person visits on the same day from 830 to 4 PM Monday through Friday. She denies needing specific assistance at this time and She will be followed by Tanya Nones, RN (Nurse Navigator).   Plan: Reviewed current plan of care including upcoming appointments. Discussed symptom management clinic and how to get in touch with [place]. Discussed medications including antiemetics that were sent to pharmacy.  Previously using a mail in pharmacy.  She would like to switch to [facility] mail order. Declines any questions at this time.  Disposition: RTC on 01/10/2020 for simulation for radiation. RTC on 01/20/2020 for first cycle of carbo/Taxol.  Visit Diagnosis 1. Mass of lower lobe of  left lung     Patient expressed understanding and was in agreement with this plan. She also understands that She can call clinic at any time with any questions, concerns, or complaints.   I provided 15 minutes of face to face time with patient during this encounter, and > 50% was spent counseling as documented under my assessment & plan.   [place] [facility] [place]  CC: Dr. Grayland Ormond

## 2020-01-12 DIAGNOSIS — C3432 Malignant neoplasm of lower lobe, left bronchus or lung: Secondary | ICD-10-CM | POA: Diagnosis not present

## 2020-01-13 ENCOUNTER — Other Ambulatory Visit: Payer: Self-pay | Admitting: Oncology

## 2020-01-13 DIAGNOSIS — Z7189 Other specified counseling: Secondary | ICD-10-CM | POA: Insufficient documentation

## 2020-01-14 ENCOUNTER — Other Ambulatory Visit: Payer: Self-pay | Admitting: Oncology

## 2020-01-14 NOTE — Progress Notes (Signed)
[place]  [phone] [phone]  ID: [name] [name] OB: 12-27-1927  MR#: [id number]  OVA#:[id number]  Patient Care Team: Ezequiel Kayser, MD as PCP - General (Internal Medicine) Lloyd Huger, MD as Medical Oncologist (Medical Oncology)   CHIEF COMPLAINT: At least stage IIa left lower lobe lung malignancy.    INTERVAL HISTORY: Patient returns to clinic today for further evaluation and initiation of cycle 1 of weekly carboplatinum and Taxol.  She initiated XRT earlier this week.  She currently feels well and is asymptomatic. She does not complain of pain today. She has no neurologic complaints.  She denies any recent fevers or illnesses.  She denies any chest pain, shortness of breath, cough, or hemoptysis. She denies any nausea, vomiting, constipation, or diarrhea. She has no urinary complaints.  Patient offers no specific complaints today.  REVIEW OF SYSTEMS:   Review of Systems  Constitutional: Negative.  Negative for fever, malaise/fatigue and weight loss.  Respiratory: Negative.  Negative for cough and shortness of breath.   Cardiovascular: Negative.  Negative for chest pain and leg swelling.  Gastrointestinal: Negative.  Negative for abdominal pain, blood in stool, constipation, diarrhea, nausea and vomiting.  Genitourinary: Negative.  Negative for flank pain.  Musculoskeletal: Negative for back pain.  Skin: Negative.  Negative for rash.  Neurological: Negative.  Negative for tingling, sensory change, focal weakness, weakness and headaches.  Psychiatric/Behavioral: Negative.  The patient is not nervous/anxious and does not have insomnia.     As per HPI. Otherwise, a complete review of systems is negative.  PAST MEDICAL HISTORY: Past Medical History:  Diagnosis Date  . Breast cancer ([place]) 08/19/2012   LT LUMPECTOMY, radiation tx.   Marland Kitchen COPD (chronic obstructive pulmonary disease) ([place])   . Hailey-hailey disease   .  Hyperlipemia   . Hypertension   . Lung cancer ([place]) 2014   RT LUNG, radiation tx  . Migraine   . Myocardial infarction ([place])   . Osteoporosis   . Personal history of radiation therapy 2013   left breast ca  . Personal history of radiation therapy 2014   lung ca  . Radiation 2013   FOR BREAST CA  . Radiation 2014   FOR LUNG CA  . Renal artery stenosis ([place])     PAST SURGICAL HISTORY: Past Surgical History:  Procedure Laterality Date  . ABDOMINAL HYSTERECTOMY    . APPENDECTOMY    . BREAST BIOPSY Left 08/03/2012   invasive mammary carcinoma, u/s guided bx  . 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 cardiac stents    . PARATHYROIDECTOMY    . TONSILLECTOMY      FAMILY HISTORY Family History  Problem Relation Age of Onset  . Breast cancer Sister 23       ADVANCED DIRECTIVES:    HEALTH MAINTENANCE: Social History   Tobacco Use  . Smoking status: Current Every Day Smoker    Packs/day: 1.00    Years: 70.00    Pack years: 70.00  . Smokeless tobacco: Never Used  Substance Use Topics  . Alcohol use: No  . Drug use: No      Allergies  Allergen Reactions  . Bactrim [Sulfamethoxazole-Trimethoprim] Other (See Comments)    Reduced kidney function.  Per Dr. Holley Raring should not take  . Ibuprofen     Reduced kidney function. Per Dr. Holley Raring should not take  . Macrobid [employer] Macro] Other (See Comments)  Reduced Kidney function.  Should no longer take per Dr. Holley Raring  . Tape Rash    Current Outpatient Medications  Medication Sig Dispense Refill  . albuterol (PROAIR HFA) 108 (90 Base) MCG/ACT inhaler Inhale 2 puffs into the lungs every 6 (six) hours as needed for wheezing or shortness of breath.     Marland Kitchen aspirin 81 MG EC tablet Take 81 mg by mouth daily.     . calcium-vitamin D (OSCAL WITH D) 500-200 MG-UNIT TABS tablet Take by mouth.    Marland Kitchen lisinopril (ZESTRIL) 2.5 MG tablet Take 1 tablet by mouth  daily.    . metoprolol succinate (TOPROL-XL) 25 MG 24 hr tablet Take 25 mg by mouth daily.    . Multiple Vitamin (MULTIVITAMIN) tablet Take by mouth.    . simvastatin (ZOCOR) 20 MG tablet Take 20 mg by mouth at bedtime.    . traMADol (ULTRAM) 50 MG tablet Take 1 tablet (50 mg total) by mouth every 12 (twelve) hours as needed. 12 tablet 0  . amLODipine (NORVASC) 2.5 MG tablet Take 2.5 mg by mouth daily.    . ondansetron (ZOFRAN) 8 MG tablet Take 1 tablet (8 mg total) by mouth 2 (two) times daily as needed for refractory nausea / vomiting. (Patient not taking: Reported on 01/19/2020) 60 tablet 1  . prochlorperazine (COMPAZINE) 10 MG tablet Take 1 tablet (10 mg total) by mouth every 6 (six) hours as needed (Nausea or vomiting). (Patient not taking: Reported on 01/19/2020) 60 tablet 2   No current facility-administered medications for this visit.    OBJECTIVE: Vitals:   01/20/20 0952  BP: (!) 155/94  Pulse: (!) 101  Resp: 20  Temp: 97.8 F (36.6 C)     Body mass index is 19.15 kg/m.    ECOG FS:0 - Asymptomatic  General: Thin, no acute distress. Eyes: Pink conjunctiva, anicteric sclera. HEENT: Normocephalic, moist mucous membranes. Lungs: No audible wheezing or coughing. Heart: Regular rate and rhythm. Abdomen: Soft, nontender, no obvious distention. Musculoskeletal: No edema, cyanosis, or clubbing. Neuro: Alert, answering all questions appropriately. Cranial nerves grossly intact. Skin: No rashes or petechiae noted. Psych: Normal affect.  LAB RESULTS:  Lab Results  Component Value Date   NA 138 01/20/2020   K 4.3 01/20/2020   CL 104 01/20/2020   CO2 24 01/20/2020   GLUCOSE 111 (H) 01/20/2020   BUN 27 (H) 01/20/2020   CREATININE 1.17 (H) 01/20/2020   CALCIUM 8.9 01/20/2020   PROT 7.2 01/20/2020   ALBUMIN 3.6 01/20/2020   AST 33 01/20/2020   ALT 22 01/20/2020   ALKPHOS 96 01/20/2020   BILITOT 0.4 01/20/2020   GFRNONAA 41 (L) 01/20/2020   GFRAA 48 (L) 01/20/2020    Lab  Results  Component Value Date   WBC 7.3 01/20/2020   NEUTROABS 5.6 01/20/2020   HGB 12.2 01/20/2020   HCT 40.0 01/20/2020   MCV 94.1 01/20/2020   PLT 248 01/20/2020     STUDIES: VAS US RENAL ARTERY DUPLEX  Result Date: 01/09/2020 ABDOMINAL VISCERAL Indications: Hypertension Comparison Study: 11/22/2018 Performing Technologist: Almira Coaster RVS  Examination Guidelines: A complete evaluation includes B-mode imaging, spectral Doppler, color Doppler, and power Doppler as needed of all accessible portions of each vessel. Bilateral testing is considered an integral part of a complete examination. Limited examinations for reoccurring indications may be performed as noted.  Duplex Findings: +------------+--------+--------+------+--------+ Mesenteric  PSV cm/sEDV cm/sPlaqueComments +------------+--------+--------+------+--------+ Aorta Prox     79      22                  +------------+--------+--------+------+--------+  Aorta Mid      83      24                  +------------+--------+--------+------+--------+ Aorta Distal   36      10                  +------------+--------+--------+------+--------+  +------------------+--------+--------+-------+ Right Renal ArteryPSV cm/sEDV cm/sComment +------------------+--------+--------+-------+ Origin              117      42           +------------------+--------+--------+-------+ Proximal             93      25           +------------------+--------+--------+-------+ Mid                  76      18           +------------------+--------+--------+-------+ Distal               72      26           +------------------+--------+--------+-------+ +-----------------+--------+--------+-------+ Left Renal ArteryPSV cm/sEDV cm/sComment +-----------------+--------+--------+-------+ Origin              71      17           +-----------------+--------+--------+-------+ Proximal            69      15            +-----------------+--------+--------+-------+ Mid                 49      13           +-----------------+--------+--------+-------+ Distal              58      14           +-----------------+--------+--------+-------+  +------------------+----+------------------+----+ Right Kidney          Left Kidney            +------------------+----+------------------+----+ RAR (manual)      .71 RAR (manual)      1.17 +------------------+----+------------------+----+ Kidney length (cm)9.66Kidney length (cm)8.40 +------------------+----+------------------+----+  Summary: Largest Aortic Diameter: 3.3 cm  Renal:  Right: No evidence of right renal artery stenosis. Normal size right        kidney. RRV flow present. Left:  No evidence of left renal artery stenosis. Abnormal size for        the left kidney. LRV flow present.         The bilateral kidney diameters appear decreased when compared        to the previous exam on 11/22/18. Known distal AAA noted when        compared to the previous CT on 11/01/19.  *See table(s) above for measurements and observations.  Diagnosing physician: Hortencia Pilar MD Electronically signed by Hortencia Pilar MD on 01/09/2020 at 1:49:18 PM.    Final     ASSESSMENT: At least stage IIa left lower lobe lung malignancy.   PLAN:    1.  At least stage IIa left lower lobe lung malignancy: Patient's initial malignancy was in the right upper lobe, therefore it is unclear if this is a second primary or a metastatic lesion.  PET scan results from November 09, 2019 reviewed independently and reported as above.  Patient has declined biopsy to confirm diagnosis.  Patient  initiated XRT earlier this week.  Proceed with cycle 1 of weekly carboplatinum and Taxol.  Return to clinic in 1 week for further evaluation and consideration of cycle 2. 2.  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  PET scan results from Apr 02, 2018  revealed suspected recurrence in subcarinal lymph nodes.  She underwent XRT completing in August 2019.  PET scan results as above.  Unclear if this is a recurrence or second primary. 3. 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  Oncotype testing was not performed as this would not change the treatment plan.  Patient completed 5 years of letrozole in November 2019.  Her most recent mammogram on October 31, 2019 was reported BI-RADS 1.  Repeat in 1 year. 4.  Osteoporosis: Patient's most recent bone mineral density on October 31, 2019 reported T score of -3.1 which is unchanged from 1 year prior. Patient last received Prolia on October 06, 2019.     Patient expressed understanding and was in agreement with this plan. She also understands that She can call clinic at any time with any questions, concerns, or complaints.    Lloyd Huger, MD 01/21/20 4:48 PM

## 2020-01-17 ENCOUNTER — Other Ambulatory Visit: Payer: Self-pay

## 2020-01-17 ENCOUNTER — Ambulatory Visit: Admission: RE | Admit: 2020-01-17 | Payer: Medicare Other | Source: Ambulatory Visit

## 2020-01-17 DIAGNOSIS — C3432 Malignant neoplasm of lower lobe, left bronchus or lung: Secondary | ICD-10-CM | POA: Diagnosis not present

## 2020-01-18 ENCOUNTER — Ambulatory Visit
Admission: RE | Admit: 2020-01-18 | Discharge: 2020-01-18 | Disposition: A | Discharge status: Home / Self Care | Payer: Medicare Other | Source: Ambulatory Visit | Attending: Radiation Oncology | Admitting: Radiation Oncology

## 2020-01-18 ENCOUNTER — Other Ambulatory Visit: Payer: Self-pay

## 2020-01-18 DIAGNOSIS — C3432 Malignant neoplasm of lower lobe, left bronchus or lung: Secondary | ICD-10-CM | POA: Diagnosis not present

## 2020-01-19 ENCOUNTER — Encounter: Payer: Self-pay | Admitting: Oncology

## 2020-01-19 ENCOUNTER — Other Ambulatory Visit: Payer: Self-pay

## 2020-01-19 ENCOUNTER — Ambulatory Visit
Admission: RE | Admit: 2020-01-19 | Discharge: 2020-01-19 | Disposition: A | Discharge status: Home / Self Care | Payer: Medicare Other | Source: Ambulatory Visit | Attending: Radiation Oncology | Admitting: Radiation Oncology

## 2020-01-19 DIAGNOSIS — C3432 Malignant neoplasm of lower lobe, left bronchus or lung: Secondary | ICD-10-CM | POA: Diagnosis not present

## 2020-01-19 NOTE — Progress Notes (Signed)
Patient prescreened for appointment. Patient has no concerns or questions.  

## 2020-01-20 ENCOUNTER — Encounter: Payer: Self-pay | Admitting: Oncology

## 2020-01-20 ENCOUNTER — Inpatient Hospital Stay (HOSPITAL_BASED_OUTPATIENT_CLINIC_OR_DEPARTMENT_OTHER): Payer: Medicare Other | Admitting: Hospice and Palliative Medicine

## 2020-01-20 ENCOUNTER — Inpatient Hospital Stay (HOSPITAL_BASED_OUTPATIENT_CLINIC_OR_DEPARTMENT_OTHER): Payer: Medicare Other | Admitting: Oncology

## 2020-01-20 ENCOUNTER — Ambulatory Visit
Admission: RE | Admit: 2020-01-20 | Discharge: 2020-01-20 | Disposition: A | Discharge status: Home / Self Care | Payer: Medicare Other | Source: Ambulatory Visit | Attending: Radiation Oncology | Admitting: Radiation Oncology

## 2020-01-20 ENCOUNTER — Inpatient Hospital Stay: Payer: Medicare Other | Attending: Oncology

## 2020-01-20 ENCOUNTER — Other Ambulatory Visit: Payer: Self-pay

## 2020-01-20 VITALS — BP 142/86 | HR 87 | Resp 18

## 2020-01-20 VITALS — BP 155/94 | HR 101 | Temp 97.8°F | Resp 20 | Wt 108.1 lb

## 2020-01-20 DIAGNOSIS — M810 Age-related osteoporosis without current pathological fracture: Secondary | ICD-10-CM | POA: Diagnosis not present

## 2020-01-20 DIAGNOSIS — F17210 Nicotine dependence, cigarettes, uncomplicated: Secondary | ICD-10-CM | POA: Insufficient documentation

## 2020-01-20 DIAGNOSIS — R112 Nausea with vomiting, unspecified: Secondary | ICD-10-CM | POA: Diagnosis not present

## 2020-01-20 DIAGNOSIS — C3432 Malignant neoplasm of lower lobe, left bronchus or lung: Secondary | ICD-10-CM | POA: Insufficient documentation

## 2020-01-20 DIAGNOSIS — Z5111 Encounter for antineoplastic chemotherapy: Secondary | ICD-10-CM | POA: Diagnosis not present

## 2020-01-20 DIAGNOSIS — R918 Other nonspecific abnormal finding of lung field: Secondary | ICD-10-CM

## 2020-01-20 DIAGNOSIS — Z90710 Acquired absence of both cervix and uterus: Secondary | ICD-10-CM | POA: Diagnosis not present

## 2020-01-20 DIAGNOSIS — Z853 Personal history of malignant neoplasm of breast: Secondary | ICD-10-CM | POA: Diagnosis not present

## 2020-01-20 DIAGNOSIS — I10 Essential (primary) hypertension: Secondary | ICD-10-CM | POA: Insufficient documentation

## 2020-01-20 DIAGNOSIS — Z515 Encounter for palliative care: Secondary | ICD-10-CM

## 2020-01-20 LAB — COMPREHENSIVE METABOLIC PANEL
ALT: 22 U/L (ref 0–44)
AST: 33 U/L (ref 15–41)
Albumin: 3.6 g/dL (ref 3.5–5.0)
Alkaline Phosphatase: 96 U/L (ref 38–126)
Anion gap: 10 (ref 5–15)
BUN: 27 mg/dL — ABNORMAL HIGH (ref 8–23)
CO2: 24 mmol/L (ref 22–32)
Calcium: 8.9 mg/dL (ref 8.9–10.3)
Chloride: 104 mmol/L (ref 98–111)
Creatinine, Ser: 1.17 mg/dL — ABNORMAL HIGH (ref 0.44–1.00)
GFR calc Af Amer: 48 mL/min — ABNORMAL LOW (ref >60)
GFR calc non Af Amer: 41 mL/min — ABNORMAL LOW (ref >60)
Glucose, Bld: 111 mg/dL — ABNORMAL HIGH (ref 70–99)
Potassium: 4.3 mmol/L (ref 3.5–5.1)
Sodium: 138 mmol/L (ref 135–145)
Total Bilirubin: 0.4 mg/dL (ref 0.3–1.2)
Total Protein: 7.2 g/dL (ref 6.5–8.1)

## 2020-01-20 LAB — CBC WITH DIFFERENTIAL/PLATELET
Abs Immature Granulocytes: 0.02 10*3/uL (ref 0.00–0.07)
Basophils Absolute: 0 10*3/uL (ref 0.0–0.1)
Basophils Relative: 1 %
Eosinophils Absolute: 0.1 10*3/uL (ref 0.0–0.5)
Eosinophils Relative: 1 %
HCT: 40 % (ref 36.0–46.0)
Hemoglobin: 12.2 g/dL (ref 12.0–15.0)
Immature Granulocytes: 0 %
Lymphocytes Relative: 13 %
Lymphs Abs: 0.9 10*3/uL (ref 0.7–4.0)
MCH: 28.7 pg (ref 26.0–34.0)
MCHC: 30.5 g/dL (ref 30.0–36.0)
MCV: 94.1 fL (ref 80.0–100.0)
Monocytes Absolute: 0.6 10*3/uL (ref 0.1–1.0)
Monocytes Relative: 9 %
Neutro Abs: 5.6 10*3/uL (ref 1.7–7.7)
Neutrophils Relative %: 76 %
Platelets: 248 10*3/uL (ref 150–400)
RBC: 4.25 MIL/uL (ref 3.87–5.11)
RDW: 14.1 % (ref 11.5–15.5)
WBC: 7.3 10*3/uL (ref 4.0–10.5)
nRBC: 0 % (ref 0.0–0.2)

## 2020-01-20 MED ORDER — SODIUM CHLORIDE 0.9 % IV SOLN
Freq: Once | INTRAVENOUS | Status: AC
  Filled 2020-01-20: qty 250

## 2020-01-20 MED ORDER — PALONOSETRON HCL INJECTION 0.25 MG/5ML
0.2500 mg | Freq: Once | INTRAVENOUS | Status: AC
  Administered 2020-01-20: 11:00:00 0.25 mg via INTRAVENOUS
  Filled 2020-01-20: qty 5

## 2020-01-20 MED ORDER — DIPHENHYDRAMINE HCL 50 MG/ML IJ SOLN
25.0000 mg | Freq: Once | INTRAMUSCULAR | Status: AC
  Administered 2020-01-20: 11:00:00 25 mg via INTRAVENOUS
  Filled 2020-01-20: qty 1

## 2020-01-20 MED ORDER — SODIUM CHLORIDE 0.9 % IV SOLN
98.6000 mg | Freq: Once | INTRAVENOUS | Status: AC
  Administered 2020-01-20: 14:00:00 100 mg via INTRAVENOUS
  Filled 2020-01-20: qty 10

## 2020-01-20 MED ORDER — SODIUM CHLORIDE 0.9 % IV SOLN
45.0000 mg/m2 | Freq: Once | INTRAVENOUS | Status: AC
  Administered 2020-01-20: 12:00:00 66 mg via INTRAVENOUS
  Filled 2020-01-20: qty 11

## 2020-01-20 MED ORDER — SODIUM CHLORIDE 0.9 % IV SOLN
10.0000 mg | Freq: Once | INTRAVENOUS | Status: AC
  Administered 2020-01-20: 11:00:00 10 mg via INTRAVENOUS
  Filled 2020-01-20: qty 10

## 2020-01-20 MED ORDER — FAMOTIDINE IN NACL 20-0.9 MG/50ML-% IV SOLN
20.0000 mg | Freq: Once | INTRAVENOUS | Status: AC
  Administered 2020-01-20: 20 mg via INTRAVENOUS
  Filled 2020-01-20: qty 50

## 2020-01-20 NOTE — Progress Notes (Signed)
No changes since pt spoke with Rn yesterday for pre assessment. Pt is very anxious about chemo treatment today.

## 2020-01-20 NOTE — Progress Notes (Signed)
[place]  [phone] [phone]) [phone]   Name: [name] [name] [facility] Date: 01/20/2020 MRN: [id number]  DOB: 04/15/1928  Patient Care Team: Ezequiel Kayser, MD as PCP - General (Internal Medicine) Lloyd Huger, MD as Medical Oncologist (Medical Oncology)    REASON FOR CONSULTATION: [name] [name] is a 86 y.o. 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  Patient has declined biopsy to confirm recurrent lung cancer.  Patient is referred to palliative care to help address goals.  SOCIAL HISTORY:     reports that she has been smoking. She has a 70.00 pack-year smoking history. She has never used smokeless tobacco. She reports that she does not drink alcohol or use drugs.   Patient is widowed.  She lives at home with a daughter.  In total, she has 2 daughters and 2 sons.  Patient previously worked in [employer].  ADVANCE DIRECTIVES:  Not on file  CODE STATUS: DNR in [place]: Past Medical History:  Diagnosis Date  . Breast cancer ([place]) 08/19/2012   LT LUMPECTOMY, radiation tx.   Marland Kitchen COPD (chronic obstructive pulmonary disease) ([place])   . Hailey-hailey disease   . Hyperlipemia   . Hypertension   . Lung cancer ([place]) 2014   RT LUNG, radiation tx  . Migraine   . Myocardial infarction ([place])   . Osteoporosis   . Personal history of radiation therapy 2013   left breast ca  . Personal history of radiation therapy 2014   lung ca  . Radiation 2013   FOR BREAST CA  . Radiation 2014   FOR LUNG CA  . Renal artery stenosis ([place])     PAST SURGICAL HISTORY:  Past Surgical History:  Procedure Laterality Date  . ABDOMINAL HYSTERECTOMY    . APPENDECTOMY    . BREAST BIOPSY Left  08/03/2012   invasive mammary carcinoma, u/s guided bx  . 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 cardiac stents    . PARATHYROIDECTOMY    . TONSILLECTOMY      HEMATOLOGY/ONCOLOGY HISTORY:  Oncology History   No history exists.    ALLERGIES:  is allergic to bactrim [sulfamethoxazole-trimethoprim]; ibuprofen; macrobid [nitrofurantoin monohyd macro]; and tape.  MEDICATIONS:  Current Outpatient Medications  Medication Sig Dispense Refill  . albuterol (PROAIR HFA) 108 (90 Base) MCG/ACT inhaler Inhale 2 puffs into the lungs every 6 (six) hours as needed for wheezing or shortness of breath.     Marland Kitchen amLODipine (NORVASC) 2.5 MG tablet Take 2.5 mg by mouth daily.    Marland Kitchen aspirin 81 MG EC tablet Take 81 mg by mouth daily.     . calcium-vitamin D (OSCAL WITH D) 500-200 MG-UNIT TABS tablet Take by mouth.    Marland Kitchen lisinopril (ZESTRIL) 2.5 MG tablet Take 1 tablet by mouth daily.    . metoprolol succinate (TOPROL-XL) 25 MG 24 hr tablet Take 25 mg by mouth daily.    . Multiple Vitamin (MULTIVITAMIN) tablet Take by mouth.    . ondansetron (ZOFRAN) 8 MG tablet Take 1 tablet (8 mg total) by mouth 2 (two) times daily as needed for refractory nausea / vomiting. (Patient not taking: Reported on 01/19/2020) 60 tablet 1  . prochlorperazine (COMPAZINE)  10 MG tablet Take 1 tablet (10 mg total) by mouth every 6 (six) hours as needed (Nausea or vomiting). (Patient not taking: Reported on 01/19/2020) 60 tablet 2  . simvastatin (ZOCOR) 20 MG tablet Take 20 mg by mouth at bedtime.    . traMADol (ULTRAM) 50 MG tablet Take 1 tablet (50 mg total) by mouth every 12 (twelve) hours as needed. 12 tablet 0   No current facility-administered medications for this visit.   Facility-Administered Medications Ordered in Other Visits  Medication Dose Route Frequency Provider Last Rate Last Admin  . CARBOplatin (PARAPLATIN) 100 mg in sodium chloride 0.9 % 100 mL chemo infusion  100  mg Intravenous Once Lloyd Huger, MD        VITAL SIGNS: There were no vitals taken for this visit. There were no vitals filed for this visit.  Estimated body mass index is 19.15 kg/m as calculated from the following:   Height as of 11/28/19: 5' 3"  (1.6 m).   Weight as of an earlier encounter on 01/20/20: 108 lb 1.6 oz (49 kg).  LABS: CBC:    Component Value Date/Time   WBC 7.3 01/20/2020 0924   HGB 12.2 01/20/2020 0924   HGB 12.9 03/05/2015 2050   HCT 40.0 01/20/2020 0924   HCT 39.8 03/05/2015 2050   PLT 248 01/20/2020 0924   PLT 222 03/05/2015 2050   MCV 94.1 01/20/2020 0924   MCV 93 03/05/2015 2050   NEUTROABS 5.6 01/20/2020 0924   NEUTROABS 10.2 (H) 03/05/2015 2050   LYMPHSABS 0.9 01/20/2020 0924   LYMPHSABS 1.8 03/05/2015 2050   MONOABS 0.6 01/20/2020 0924   MONOABS 1.2 (H) 03/05/2015 2050   EOSABS 0.1 01/20/2020 0924   EOSABS 0.2 03/05/2015 2050   BASOSABS 0.0 01/20/2020 0924   BASOSABS 0.1 03/05/2015 2050   Comprehensive Metabolic Panel:    Component Value Date/Time   NA 138 01/20/2020 0924   NA 136 03/05/2015 2050   K 4.3 01/20/2020 0924   K 3.9 03/05/2015 2050   CL 104 01/20/2020 0924   CL 107 03/05/2015 2050   CO2 24 01/20/2020 0924   CO2 22 03/05/2015 2050   BUN 27 (H) 01/20/2020 0924   BUN 38 (H) 03/05/2015 2050   CREATININE 1.17 (H) 01/20/2020 0924   CREATININE 1.47 (H) 03/05/2015 2050   GLUCOSE 111 (H) 01/20/2020 0924   GLUCOSE 116 (H) 03/05/2015 2050   CALCIUM 8.9 01/20/2020 0924   CALCIUM 8.5 (L) 03/05/2015 2050   AST 33 01/20/2020 0924   ALT 22 01/20/2020 0924   ALKPHOS 96 01/20/2020 0924   BILITOT 0.4 01/20/2020 0924   PROT 7.2 01/20/2020 0924   ALBUMIN 3.6 01/20/2020 0924    RADIOGRAPHIC STUDIES: VAS US RENAL ARTERY DUPLEX  Result Date: 01/09/2020 ABDOMINAL VISCERAL Indications: Hypertension Comparison Study: 11/22/2018 Performing Technologist: Almira Coaster RVS  Examination Guidelines: A complete evaluation includes B-mode  imaging, spectral Doppler, color Doppler, and power Doppler as needed of all accessible portions of each vessel. Bilateral testing is considered an integral part of a complete examination. Limited examinations for reoccurring indications may be performed as noted.  Duplex Findings: +------------+--------+--------+------+--------+ Mesenteric  PSV cm/sEDV cm/sPlaqueComments +------------+--------+--------+------+--------+ Aorta Prox     79      22                  +------------+--------+--------+------+--------+ Aorta Mid      83      24                  +------------+--------+--------+------+--------+  Aorta Distal   36      10                  +------------+--------+--------+------+--------+  +------------------+--------+--------+-------+ Right Renal ArteryPSV cm/sEDV cm/sComment +------------------+--------+--------+-------+ Origin              117      42           +------------------+--------+--------+-------+ Proximal             93      25           +------------------+--------+--------+-------+ Mid                  76      18           +------------------+--------+--------+-------+ Distal               72      26           +------------------+--------+--------+-------+ +-----------------+--------+--------+-------+ Left Renal ArteryPSV cm/sEDV cm/sComment +-----------------+--------+--------+-------+ Origin              71      17           +-----------------+--------+--------+-------+ Proximal            69      15           +-----------------+--------+--------+-------+ Mid                 49      13           +-----------------+--------+--------+-------+ Distal              58      14           +-----------------+--------+--------+-------+  +------------------+----+------------------+----+ Right Kidney          Left Kidney            +------------------+----+------------------+----+ RAR (manual)      .71 RAR (manual)      1.17  +------------------+----+------------------+----+ Kidney length (cm)9.66Kidney length (cm)8.40 +------------------+----+------------------+----+  Summary: Largest Aortic Diameter: 3.3 cm  Renal:  Right: No evidence of right renal artery stenosis. Normal size right        kidney. RRV flow present. Left:  No evidence of left renal artery stenosis. Abnormal size for        the left kidney. LRV flow present.         The bilateral kidney diameters appear decreased when compared        to the previous exam on 11/22/18. Known distal AAA noted when        compared to the previous CT on 11/01/19.  *See table(s) above for measurements and observations.  Diagnosing physician: Hortencia Pilar MD Electronically signed by Hortencia Pilar MD on 01/09/2020 at 1:49:18 PM.    Final     PERFORMANCE STATUS (ECOG) : 1 - Symptomatic but completely ambulatory  Review of Systems Unless otherwise noted, a complete review of systems is negative.  Physical Exam General: NAD, thin, bilateral temporal wasting Pulmonary: Unlabored Extremities: no edema, no joint deformities Skin: no rashes Neurological: Weakness but otherwise nonfocal  IMPRESSION: Met with patient in the infusion area.  Introduced palliative care services and attempted establish therapeutic rapport.  Patient reports that she is doing well.  She denies any significant changes or concerns.  She denies any symptomatic complaints today.  At baseline, she says that she lives at home with her daughter.  She  is functionally independent in the home.  Patient does not use a cane or walker to assist with ambulation.  Patient says that she still navigates her stairs with ease.  She says that her goal is to maintain functional independence for as long as possible.  Patient says that her appetite is poor.  She does drink Boost once or twice a day.  BMI of 18.  Will refer to nutrition.  Note the patient has a DNR on file.  Will benefit from clarification regarding  ACP/MOST form.  PLAN: -Continue current scope of treatment -DNR -Future conversation regarding ACP/MOST form -Recommend oral supplements 3 times daily -Referral to nutrition -RTC in 2 weeks  Patient expressed understanding and was in agreement with this plan. She also understands that She can call the clinic at any time with any questions, concerns, or complaints.     Time Total: 15 minutes  Visit consisted of counseling and education dealing with the complex and emotionally intense issues of symptom management and palliative care in the setting of serious and potentially life-threatening illness.Greater than 50%  of this time was spent counseling and coordinating care related to the above assessment and plan.  Signed by: Altha Harm, PhD, NP-C

## 2020-01-20 NOTE — Progress Notes (Signed)
Pulse Rate: 101. MD, Dr. Grayland Ormond, notified and aware. Per MD order: proceed with scheduled Taxol and Carboplatin treatment today.

## 2020-01-20 NOTE — Addendum Note (Signed)
Addended by: Faythe Casa E on: 01/20/2020 01:02 PM   Modules accepted: Level of Service

## 2020-01-22 NOTE — Progress Notes (Signed)
[address]  [phone] Fax:(336) [phone]  ID: [name] [name] OB: 08/16/1928  MR#: [id number]  VZD#:[id number]  Patient Care Team: Ezequiel Kayser, MD as PCP - General (Internal Medicine) Lloyd Huger, MD as Medical Oncologist (Medical Oncology)   CHIEF COMPLAINT: At least stage IIa left lower lobe lung malignancy.    INTERVAL HISTORY: Patient returns to clinic today for further evaluation and consideration of cycle 2 of weekly carboplatinum and Taxol.  She tolerated her first infusion well without significant side effects.  She continues with daily XRT.  She had one episode of nausea, that was well controlled with her current medications. She currently feels well and is asymptomatic. She does not complain of pain today. She has no neurologic complaints.  She denies any recent fevers or illnesses.  She denies any chest pain, shortness of breath, cough, or hemoptysis. She denies any nausea, vomiting, constipation, or diarrhea. She has no urinary complaints.  Patient offers no further specific complaints today.  REVIEW OF SYSTEMS:   Review of Systems  Constitutional: Negative.  Negative for fever, malaise/fatigue and weight loss.  Respiratory: Negative.  Negative for cough and shortness of breath.   Cardiovascular: Negative.  Negative for chest pain and leg swelling.  Gastrointestinal: Negative.  Negative for abdominal pain, blood in stool, constipation, diarrhea, nausea and vomiting.  Genitourinary: Negative.  Negative for flank pain.  Musculoskeletal: Negative for back pain.  Skin: Negative.  Negative for rash.  Neurological: Negative.  Negative for tingling, sensory change, focal weakness, weakness and headaches.  Psychiatric/Behavioral: Negative.  The patient is not nervous/anxious and does not have insomnia.     As per HPI. Otherwise, a complete review of systems is negative.  PAST MEDICAL HISTORY: Past Medical History:  Diagnosis Date    . Breast cancer ([place]) 08/19/2012   LT LUMPECTOMY, radiation tx.   Marland Kitchen COPD (chronic obstructive pulmonary disease) ([place])   . Hailey-hailey disease   . Hyperlipemia   . Hypertension   . Lung cancer ([place]) 2014   RT LUNG, radiation tx  . Migraine   . Myocardial infarction ([place])   . Osteoporosis   . Personal history of radiation therapy 2013   left breast ca  . Personal history of radiation therapy 2014   lung ca  . Radiation 2013   FOR BREAST CA  . Radiation 2014   FOR LUNG CA  . Renal artery stenosis ([place])     PAST SURGICAL HISTORY: Past Surgical History:  Procedure Laterality Date  . ABDOMINAL HYSTERECTOMY    . APPENDECTOMY    . BREAST BIOPSY Left 08/03/2012   invasive mammary carcinoma, u/s guided bx  . 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 cardiac stents    . PARATHYROIDECTOMY    . TONSILLECTOMY      FAMILY HISTORY Family History  Problem Relation Age of Onset  . Breast cancer Sister 103       ADVANCED DIRECTIVES:    HEALTH MAINTENANCE: Social History   Tobacco Use  . Smoking status: Current Every Day Smoker    Packs/day: 1.00    Years: 70.00    Pack years: 70.00  . Smokeless tobacco: Never Used  Substance Use Topics  . Alcohol use: No  . Drug use: No      Allergies  Allergen Reactions  . Bactrim [Sulfamethoxazole-Trimethoprim] Other (See Comments)    Reduced kidney function.  Per Dr. Holley Raring should not take  .  Ibuprofen     Reduced kidney function. Per Dr. Holley Raring should not take  . Macrobid [employer] Macro] Other (See Comments)    Reduced Kidney function.  Should no longer take per Dr. Holley Raring  . Tape Rash    Current Outpatient Medications  Medication Sig Dispense Refill  . albuterol (PROAIR HFA) 108 (90 Base) MCG/ACT inhaler Inhale 2 puffs into the lungs every 6 (six) hours as needed for wheezing or shortness of breath.     Marland Kitchen amLODipine (NORVASC) 2.5 MG tablet Take 2.5  mg by mouth daily.    Marland Kitchen aspirin 81 MG EC tablet Take 81 mg by mouth daily.     . calcium-vitamin D (OSCAL WITH D) 500-200 MG-UNIT TABS tablet Take by mouth.    Arne Cleveland 2.5 MG TABS tablet Take 2.5 mg by mouth 2 (two) times daily.    Marland Kitchen lisinopril (ZESTRIL) 2.5 MG tablet Take 1 tablet by mouth daily.    . metoprolol succinate (TOPROL-XL) 25 MG 24 hr tablet Take 25 mg by mouth 2 (two) times daily.     . Multiple Vitamin (MULTIVITAMIN) tablet Take by mouth.    . ondansetron (ZOFRAN) 8 MG tablet Take 1 tablet (8 mg total) by mouth 2 (two) times daily as needed for refractory nausea / vomiting. 60 tablet 1  . prochlorperazine (COMPAZINE) 10 MG tablet Take 1 tablet (10 mg total) by mouth every 6 (six) hours as needed (Nausea or vomiting). 60 tablet 2  . simvastatin (ZOCOR) 20 MG tablet Take 20 mg by mouth at bedtime.    . traMADol (ULTRAM) 50 MG tablet Take 1 tablet (50 mg total) by mouth every 12 (twelve) hours as needed. (Patient not taking: Reported on 01/27/2020) 12 tablet 0   No current facility-administered medications for this visit.   Facility-Administered Medications Ordered in Other Visits  Medication Dose Route Frequency Provider Last Rate Last Admin  . CARBOplatin (PARAPLATIN) 100 mg in sodium chloride 0.9 % 100 mL chemo infusion  100 mg Intravenous Once Lloyd Huger, MD      . dexamethasone (DECADRON) 10 mg in sodium chloride 0.9 % 50 mL IVPB  10 mg Intravenous Once Lloyd Huger, MD      . PACLitaxel (TAXOL) 66 mg in sodium chloride 0.9 % 150 mL chemo infusion (</= 47m/m2)  45 mg/m2 (Treatment Plan Recorded) Intravenous Once FLloyd Huger MD        OBJECTIVE: Vitals:   01/27/20 0906  BP: 105/71  Pulse: (!) 119  Resp: 18  Temp: 97.9 F (36.6 C)     Body mass index is 19.33 kg/m.    ECOG FS:0 - Asymptomatic  General: Thin, no acute distress. Eyes: Pink conjunctiva, anicteric sclera. HEENT: Normocephalic, moist mucous membranes. Lungs: No audible wheezing  or coughing. Heart: Tachycardic. Abdomen: Soft, nontender, no obvious distention. Musculoskeletal: No edema, cyanosis, or clubbing. Neuro: Alert, answering all questions appropriately. Cranial nerves grossly intact. Skin: No rashes or petechiae noted. Psych: Normal affect.   LAB RESULTS:  Lab Results  Component Value Date   NA 137 01/27/2020   K 4.6 01/27/2020   CL 103 01/27/2020   CO2 25 01/27/2020   GLUCOSE 121 (H) 01/27/2020   BUN 40 (H) 01/27/2020   CREATININE 1.24 (H) 01/27/2020   CALCIUM 9.1 01/27/2020   PROT 7.0 01/27/2020   ALBUMIN 3.4 (L) 01/27/2020   AST 28 01/27/2020   ALT 23 01/27/2020   ALKPHOS 87 01/27/2020   BILITOT 0.6 01/27/2020   GFRNONAA  38 (L) 01/27/2020   GFRAA 44 (L) 01/27/2020    Lab Results  Component Value Date   WBC 7.4 01/27/2020   NEUTROABS 6.2 01/27/2020   HGB 11.9 (L) 01/27/2020   HCT 37.5 01/27/2020   MCV 92.8 01/27/2020   PLT 293 01/27/2020     STUDIES: VAS US RENAL ARTERY DUPLEX  Result Date: 01/09/2020 ABDOMINAL VISCERAL Indications: Hypertension Comparison Study: 11/22/2018 Performing Technologist: Almira Coaster RVS  Examination Guidelines: A complete evaluation includes B-mode imaging, spectral Doppler, color Doppler, and power Doppler as needed of all accessible portions of each vessel. Bilateral testing is considered an integral part of a complete examination. Limited examinations for reoccurring indications may be performed as noted.  Duplex Findings: +------------+--------+--------+------+--------+ Mesenteric  PSV cm/sEDV cm/sPlaqueComments +------------+--------+--------+------+--------+ Aorta Prox     79      22                  +------------+--------+--------+------+--------+ Aorta Mid      83      24                  +------------+--------+--------+------+--------+ Aorta Distal   36      10                  +------------+--------+--------+------+--------+  +------------------+--------+--------+-------+  Right Renal ArteryPSV cm/sEDV cm/sComment +------------------+--------+--------+-------+ Origin              117      42           +------------------+--------+--------+-------+ Proximal             93      25           +------------------+--------+--------+-------+ Mid                  76      18           +------------------+--------+--------+-------+ Distal               72      26           +------------------+--------+--------+-------+ +-----------------+--------+--------+-------+ Left Renal ArteryPSV cm/sEDV cm/sComment +-----------------+--------+--------+-------+ Origin              71      17           +-----------------+--------+--------+-------+ Proximal            69      15           +-----------------+--------+--------+-------+ Mid                 49      13           +-----------------+--------+--------+-------+ Distal              58      14           +-----------------+--------+--------+-------+  +------------------+----+------------------+----+ Right Kidney          Left Kidney            +------------------+----+------------------+----+ RAR (manual)      .71 RAR (manual)      1.17 +------------------+----+------------------+----+ Kidney length (cm)9.66Kidney length (cm)8.40 +------------------+----+------------------+----+  Summary: Largest Aortic Diameter: 3.3 cm  Renal:  Right: No evidence of right renal artery stenosis. Normal size right        kidney. RRV flow present. Left:  No evidence of left renal artery stenosis. Abnormal size for  the left kidney. LRV flow present.         The bilateral kidney diameters appear decreased when compared        to the previous exam on 11/22/18. Known distal AAA noted when        compared to the previous CT on 11/01/19.  *See table(s) above for measurements and observations.  Diagnosing physician: Hortencia Pilar MD Electronically signed by Hortencia Pilar MD on 01/09/2020 at 1:49:18 PM.     Final     ASSESSMENT: At least stage IIa left lower lobe lung malignancy.   PLAN:    1.  At least stage IIa left lower lobe lung malignancy: Patient's initial malignancy was in the right upper lobe, therefore it is unclear if this is a second primary or a metastatic lesion.  PET scan results from November 09, 2019 reviewed independently confirming hypermetabolic lesion.  Patient has declined biopsy to confirm diagnosis.  Continue daily XRT.  Proceed with cycle 2 of weekly carboplatinum and Taxol.  Return to clinic in 1 week for further evaluation and consideration of cycle 3.   2.  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  PET scan results from Apr 02, 2018 revealed suspected recurrence in subcarinal lymph nodes.  She underwent XRT completing in August 2019.  PET scan results as above.  Unclear if this is a recurrence or second primary. 3. 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  Oncotype testing was not performed as this would not change the treatment plan.  Patient completed 5 years of letrozole in November 2019.  Her most recent mammogram on October 31, 2019 was reported BI-RADS 1.  Repeat in 1 year. 4.  Osteoporosis: Patient's most recent bone mineral density on October 31, 2019 reported T score of -3.1 which is unchanged from 1 year prior. Patient last received Prolia on October 06, 2019.   5.  Renal insufficiency: Patient's creatinine is trended up slightly.  Encouraged increased fluid intake.   Patient expressed understanding and was in agreement with this plan. She also understands that She can call clinic at any time with any questions, concerns, or complaints.    Lloyd Huger, MD 01/27/20 10:52 AM

## 2020-01-23 ENCOUNTER — Other Ambulatory Visit: Payer: Self-pay

## 2020-01-23 ENCOUNTER — Ambulatory Visit
Admission: RE | Admit: 2020-01-23 | Discharge: 2020-01-23 | Disposition: A | Discharge status: Home / Self Care | Payer: Medicare Other | Source: Ambulatory Visit | Attending: Radiation Oncology | Admitting: Radiation Oncology

## 2020-01-23 ENCOUNTER — Telehealth: Payer: Self-pay

## 2020-01-23 DIAGNOSIS — C3432 Malignant neoplasm of lower lobe, left bronchus or lung: Secondary | ICD-10-CM | POA: Diagnosis not present

## 2020-01-23 NOTE — Telephone Encounter (Signed)
Nutrition Assessment   Reason for Assessment:  Referral from [place], NP   ASSESSMENT:  86 year old female with left lower lung mass.  Patient receiving concurrent chemotherapy and radiation therapy.  Past medical history of COPD, HTN, HLD, MI, breast cancer, right lung cancer.    Spoke with patient via phone and introduced self. Patient reports that her appetite has not changed in the last 15 years.  "I don't eat right."  I am just not hungry and I can't force myself to eat."  Reports that she is scared to eat much meat due to trouble swallowing. Has had esophagus stretched in the past. Does not eat breakfast every morning but if she did daughter prepares 2 eggs, grits, toast, bacon.  She drinks boost high protein 2 per day.  Eats nabs during the day. Does eat pinto beans, limas, yogurt.    Denies constipation, diarrhea, nausea.  Reports that weight started decreasing when was taking care of husband who had cancer.      Medications: MVI, zofran, compazine   Labs: BUN 27, creatinine 1.17   Anthropometrics:   Height: 63 inches Weight: 108 lb 1.6 oz on 3/5 113 lb in 01/04/20 UBW 113-119 lb back to 2016 BMI: 19 4% weight loss in the last month  Estimated Energy Needs  Kcals: 1500-1700 Protein: 75-85 g Fluid: > 1.5 L   NUTRITION DIAGNOSIS: Inadequate oral intake related to cancer, esophageal stenosis as evidenced by 4% weight loss, low BMI   INTERVENTION:  Patient may benefit from trial of appetite stimulant Discussed soft, moist foods for ease of swallowing Discussed ways to add calories and protein in diet.   Encouraged higher calorie oral nutrition supplement.    MONITORING, EVALUATION, GOAL: Patient will consume adequate calories and protein to prevent further weight loss   Next Visit: April 12th following radiation  Chade Pitner B. Zenia Resides, [place], [place] Registered Dietitian [phone] (pager)

## 2020-01-23 NOTE — Telephone Encounter (Signed)
Telephone call to pt for follow up after receiving first infusion.  Daughter answered the phone and states pt is taking a nap.  Daughter states pt did fine with the chemo but did have a little nausea on Sat but she did take a zofran and nausea went away.   States eating well and drinking plenty of fluids.  Encouraged daughter to call for any questions or concerns.

## 2020-01-24 ENCOUNTER — Other Ambulatory Visit: Payer: Self-pay

## 2020-01-24 ENCOUNTER — Ambulatory Visit
Admission: RE | Admit: 2020-01-24 | Discharge: 2020-01-24 | Disposition: A | Discharge status: Home / Self Care | Payer: Medicare Other | Source: Ambulatory Visit | Attending: Radiation Oncology | Admitting: Radiation Oncology

## 2020-01-24 DIAGNOSIS — C3432 Malignant neoplasm of lower lobe, left bronchus or lung: Secondary | ICD-10-CM | POA: Diagnosis not present

## 2020-01-25 ENCOUNTER — Ambulatory Visit
Admission: RE | Admit: 2020-01-25 | Discharge: 2020-01-25 | Disposition: A | Discharge status: Home / Self Care | Payer: Medicare Other | Source: Ambulatory Visit | Attending: Radiation Oncology | Admitting: Radiation Oncology

## 2020-01-25 DIAGNOSIS — C3432 Malignant neoplasm of lower lobe, left bronchus or lung: Secondary | ICD-10-CM | POA: Diagnosis not present

## 2020-01-26 ENCOUNTER — Ambulatory Visit
Admission: RE | Admit: 2020-01-26 | Discharge: 2020-01-26 | Disposition: A | Discharge status: Home / Self Care | Payer: Medicare Other | Source: Ambulatory Visit | Attending: Radiation Oncology | Admitting: Radiation Oncology

## 2020-01-26 DIAGNOSIS — C3432 Malignant neoplasm of lower lobe, left bronchus or lung: Secondary | ICD-10-CM | POA: Diagnosis not present

## 2020-01-27 ENCOUNTER — Other Ambulatory Visit: Payer: Self-pay

## 2020-01-27 ENCOUNTER — Inpatient Hospital Stay (HOSPITAL_BASED_OUTPATIENT_CLINIC_OR_DEPARTMENT_OTHER): Payer: Medicare Other | Admitting: Oncology

## 2020-01-27 ENCOUNTER — Inpatient Hospital Stay: Payer: Medicare Other

## 2020-01-27 ENCOUNTER — Ambulatory Visit
Admission: RE | Admit: 2020-01-27 | Discharge: 2020-01-27 | Disposition: A | Discharge status: Home / Self Care | Payer: Medicare Other | Source: Ambulatory Visit | Attending: Radiation Oncology | Admitting: Radiation Oncology

## 2020-01-27 ENCOUNTER — Inpatient Hospital Stay (HOSPITAL_BASED_OUTPATIENT_CLINIC_OR_DEPARTMENT_OTHER): Payer: Medicare Other | Admitting: Hospice and Palliative Medicine

## 2020-01-27 ENCOUNTER — Encounter: Payer: Self-pay | Admitting: Oncology

## 2020-01-27 VITALS — BP 105/71 | HR 119 | Temp 97.9°F | Resp 18 | Wt 109.1 lb

## 2020-01-27 VITALS — BP 97/92 | HR 137

## 2020-01-27 DIAGNOSIS — R918 Other nonspecific abnormal finding of lung field: Secondary | ICD-10-CM

## 2020-01-27 DIAGNOSIS — Z515 Encounter for palliative care: Secondary | ICD-10-CM | POA: Diagnosis not present

## 2020-01-27 DIAGNOSIS — Z5111 Encounter for antineoplastic chemotherapy: Secondary | ICD-10-CM | POA: Diagnosis not present

## 2020-01-27 DIAGNOSIS — C3432 Malignant neoplasm of lower lobe, left bronchus or lung: Secondary | ICD-10-CM | POA: Diagnosis not present

## 2020-01-27 LAB — CBC WITH DIFFERENTIAL/PLATELET
Abs Immature Granulocytes: 0.05 10*3/uL (ref 0.00–0.07)
Basophils Absolute: 0 10*3/uL (ref 0.0–0.1)
Basophils Relative: 1 %
Eosinophils Absolute: 0.1 10*3/uL (ref 0.0–0.5)
Eosinophils Relative: 1 %
HCT: 37.5 % (ref 36.0–46.0)
Hemoglobin: 11.9 g/dL — ABNORMAL LOW (ref 12.0–15.0)
Immature Granulocytes: 1 %
Lymphocytes Relative: 10 %
Lymphs Abs: 0.7 10*3/uL (ref 0.7–4.0)
MCH: 29.5 pg (ref 26.0–34.0)
MCHC: 31.7 g/dL (ref 30.0–36.0)
MCV: 92.8 fL (ref 80.0–100.0)
Monocytes Absolute: 0.4 10*3/uL (ref 0.1–1.0)
Monocytes Relative: 5 %
Neutro Abs: 6.2 10*3/uL (ref 1.7–7.7)
Neutrophils Relative %: 82 %
Platelets: 293 10*3/uL (ref 150–400)
RBC: 4.04 MIL/uL (ref 3.87–5.11)
RDW: 13.9 % (ref 11.5–15.5)
WBC: 7.4 10*3/uL (ref 4.0–10.5)
nRBC: 0 % (ref 0.0–0.2)

## 2020-01-27 LAB — COMPREHENSIVE METABOLIC PANEL
ALT: 23 U/L (ref 0–44)
AST: 28 U/L (ref 15–41)
Albumin: 3.4 g/dL — ABNORMAL LOW (ref 3.5–5.0)
Alkaline Phosphatase: 87 U/L (ref 38–126)
Anion gap: 9 (ref 5–15)
BUN: 40 mg/dL — ABNORMAL HIGH (ref 8–23)
CO2: 25 mmol/L (ref 22–32)
Calcium: 9.1 mg/dL (ref 8.9–10.3)
Chloride: 103 mmol/L (ref 98–111)
Creatinine, Ser: 1.24 mg/dL — ABNORMAL HIGH (ref 0.44–1.00)
GFR calc Af Amer: 44 mL/min — ABNORMAL LOW (ref >60)
GFR calc non Af Amer: 38 mL/min — ABNORMAL LOW (ref >60)
Glucose, Bld: 121 mg/dL — ABNORMAL HIGH (ref 70–99)
Potassium: 4.6 mmol/L (ref 3.5–5.1)
Sodium: 137 mmol/L (ref 135–145)
Total Bilirubin: 0.6 mg/dL (ref 0.3–1.2)
Total Protein: 7 g/dL (ref 6.5–8.1)

## 2020-01-27 MED ORDER — PALONOSETRON HCL INJECTION 0.25 MG/5ML
0.2500 mg | Freq: Once | INTRAVENOUS | Status: AC
  Administered 2020-01-27: 0.25 mg via INTRAVENOUS
  Filled 2020-01-27: qty 5

## 2020-01-27 MED ORDER — DIPHENHYDRAMINE HCL 50 MG/ML IJ SOLN
25.0000 mg | Freq: Once | INTRAMUSCULAR | Status: AC
  Administered 2020-01-27: 25 mg via INTRAVENOUS
  Filled 2020-01-27: qty 1

## 2020-01-27 MED ORDER — SODIUM CHLORIDE 0.9 % IV SOLN
45.0000 mg/m2 | Freq: Once | INTRAVENOUS | Status: AC
  Administered 2020-01-27: 66 mg via INTRAVENOUS
  Filled 2020-01-27: qty 11

## 2020-01-27 MED ORDER — FAMOTIDINE IN NACL 20-0.9 MG/50ML-% IV SOLN
20.0000 mg | Freq: Once | INTRAVENOUS | Status: AC
  Administered 2020-01-27: 20 mg via INTRAVENOUS
  Filled 2020-01-27: qty 50

## 2020-01-27 MED ORDER — SODIUM CHLORIDE 0.9 % IV SOLN
10.0000 mg | Freq: Once | INTRAVENOUS | Status: AC
  Administered 2020-01-27: 10 mg via INTRAVENOUS
  Filled 2020-01-27: qty 10

## 2020-01-27 MED ORDER — SODIUM CHLORIDE 0.9 % IV SOLN
Freq: Once | INTRAVENOUS | Status: AC
  Filled 2020-01-27: qty 250

## 2020-01-27 MED ORDER — SODIUM CHLORIDE 0.9 % IV SOLN
95.8000 mg | Freq: Once | INTRAVENOUS | Status: AC
  Administered 2020-01-27: 100 mg via INTRAVENOUS
  Filled 2020-01-27: qty 10

## 2020-01-27 NOTE — Progress Notes (Signed)
[place]  Telephone:(336) [phone] Fax:(336) [phone]  ID: [name] [name] OB: Nov 09, 1928  MR#: [id number]  QJF#:[id number]  Patient Care Team: Ezequiel Kayser, MD as PCP - General (Internal Medicine) Lloyd Huger, MD as Medical Oncologist (Medical Oncology)   CHIEF COMPLAINT: At least stage IIa left lower lobe lung malignancy.    INTERVAL HISTORY: Patient returns to clinic today for further evaluation and consideration of cycle 3 of weekly carboplatin and Taxol.  She is tolerating her treatments relatively well without significant side effects.  She does admit to decreased appetite and fluid intake.  She also admits to being nauseous on the Saturday after she receives her treatments.  She currently feels well. She does not complain of pain today. She has no neurologic complaints.  She denies any recent fevers or illnesses.  She denies any chest pain, shortness of breath, cough, or hemoptysis. She denies any nausea, vomiting, constipation, or diarrhea. She has no urinary complaints.  Patient offers no further specific complaints today.  REVIEW OF SYSTEMS:   Review of Systems  Constitutional: Negative.  Negative for fever, malaise/fatigue and weight loss.  Respiratory: Negative.  Negative for cough and shortness of breath.   Cardiovascular: Negative.  Negative for chest pain and leg swelling.  Gastrointestinal: Negative.  Negative for abdominal pain, blood in stool, constipation, diarrhea, nausea and vomiting.  Genitourinary: Negative.  Negative for flank pain.  Musculoskeletal: Negative for back pain.  Skin: Negative.  Negative for rash.  Neurological: Negative.  Negative for tingling, sensory change, focal weakness, weakness and headaches.  Psychiatric/Behavioral: Negative.  The patient is not nervous/anxious and does not have insomnia.     As per HPI. Otherwise, a complete review of systems is negative.  PAST MEDICAL HISTORY: Past Medical History:    Diagnosis Date  . Breast cancer ([place]) 08/19/2012   LT LUMPECTOMY, radiation tx.   Marland Kitchen COPD (chronic obstructive pulmonary disease) ([place])   . Hailey-hailey disease   . Hyperlipemia   . Hypertension   . Lung cancer ([place]) 2014   RT LUNG, radiation tx  . Migraine   . Myocardial infarction ([place])   . Osteoporosis   . Personal history of radiation therapy 2013   left breast ca  . Personal history of radiation therapy 2014   lung ca  . Radiation 2013   FOR BREAST CA  . Radiation 2014   FOR LUNG CA  . Renal artery stenosis ([place])     PAST SURGICAL HISTORY: Past Surgical History:  Procedure Laterality Date  . ABDOMINAL HYSTERECTOMY    . APPENDECTOMY    . BREAST BIOPSY Left 08/03/2012   invasive mammary carcinoma, u/s guided bx  . 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 cardiac stents    . PARATHYROIDECTOMY    . TONSILLECTOMY      FAMILY HISTORY Family History  Problem Relation Age of Onset  . Breast cancer Sister 36       ADVANCED DIRECTIVES:    HEALTH MAINTENANCE: Social History   Tobacco Use  . Smoking status: Current Every Day Smoker    Packs/day: 1.00    Years: 70.00    Pack years: 70.00  . Smokeless tobacco: Never Used  Substance Use Topics  . Alcohol use: No  . Drug use: No      Allergies  Allergen Reactions  . Bactrim [Sulfamethoxazole-Trimethoprim] Other (See Comments)    Reduced kidney function.  Per Dr. Holley Raring should not  take  . Ibuprofen     Reduced kidney function. Per Dr. Holley Raring should not take  . Macrobid [employer] Macro] Other (See Comments)    Reduced Kidney function.  Should no longer take per Dr. Holley Raring  . Tape Rash    Current Outpatient Medications  Medication Sig Dispense Refill  . albuterol (PROAIR HFA) 108 (90 Base) MCG/ACT inhaler Inhale 2 puffs into the lungs every 6 (six) hours as needed for wheezing or shortness of breath.     Marland Kitchen amLODipine (NORVASC) 2.5  MG tablet Take 2.5 mg by mouth daily.    Marland Kitchen aspirin 81 MG EC tablet Take 81 mg by mouth daily.     . calcium-vitamin D (OSCAL WITH D) 500-200 MG-UNIT TABS tablet Take by mouth.    Arne Cleveland 2.5 MG TABS tablet Take 2.5 mg by mouth 2 (two) times daily.    Marland Kitchen lisinopril (ZESTRIL) 2.5 MG tablet Take 1 tablet by mouth daily.    . metoprolol succinate (TOPROL-XL) 25 MG 24 hr tablet Take 25 mg by mouth 2 (two) times daily.     . Multiple Vitamin (MULTIVITAMIN) tablet Take by mouth.    . ondansetron (ZOFRAN) 8 MG tablet Take 1 tablet (8 mg total) by mouth 2 (two) times daily as needed for refractory nausea / vomiting. 60 tablet 1  . prochlorperazine (COMPAZINE) 10 MG tablet Take 1 tablet (10 mg total) by mouth every 6 (six) hours as needed (Nausea or vomiting). 60 tablet 2  . simvastatin (ZOCOR) 20 MG tablet Take 20 mg by mouth at bedtime.    . traMADol (ULTRAM) 50 MG tablet Take 1 tablet (50 mg total) by mouth every 12 (twelve) hours as needed. 12 tablet 0   No current facility-administered medications for this visit.    OBJECTIVE: Vitals:   02/03/20 0859  BP: 121/78  Pulse: (!) 110  Temp: 97.9 F (36.6 C)  SpO2: 98%     Body mass index is 19.34 kg/m.    ECOG FS:0 - Asymptomatic  General: Thin, no acute distress. Eyes: Pink conjunctiva, anicteric sclera. HEENT: Normocephalic, moist mucous membranes. Lungs: No audible wheezing or coughing. Heart: Tachycardic. Abdomen: Soft, nontender, no obvious distention. Musculoskeletal: No edema, cyanosis, or clubbing. Neuro: Alert, answering all questions appropriately. Cranial nerves grossly intact. Skin: No rashes or petechiae noted. Psych: Normal affect.   LAB RESULTS:  Lab Results  Component Value Date   NA 139 02/03/2020   K 5.1 02/03/2020   CL 105 02/03/2020   CO2 25 02/03/2020   GLUCOSE 94 02/03/2020   BUN 38 (H) 02/03/2020   CREATININE 1.39 (H) 02/03/2020   CALCIUM 8.8 (L) 02/03/2020   PROT 6.9 02/03/2020   ALBUMIN 3.4 (L)  02/03/2020   AST 22 02/03/2020   ALT 24 02/03/2020   ALKPHOS 88 02/03/2020   BILITOT 0.6 02/03/2020   GFRNONAA 33 (L) 02/03/2020   GFRAA 39 (L) 02/03/2020    Lab Results  Component Value Date   WBC 4.0 02/03/2020   NEUTROABS 3.2 02/03/2020   HGB 11.7 (L) 02/03/2020   HCT 36.8 02/03/2020   MCV 92.9 02/03/2020   PLT 289 02/03/2020     STUDIES: No results found.  ASSESSMENT: At least stage IIa left lower lobe lung malignancy.   PLAN:    1.  At least stage IIa left lower lobe lung malignancy: Patient's initial malignancy was in the right upper lobe, therefore it is unclear if this is a second primary or a metastatic lesion.  PET scan results from November 09, 2019 reviewed independently confirming hypermetabolic lesion.  Patient has declined biopsy to confirm diagnosis.  Continue daily XRT.  Proceed with cycle 3 of weekly carboplatinum and Taxol.  Return to clinic in 1 week for further evaluation and consideration of cycle 4.   2.  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  PET scan results from Apr 02, 2018 revealed suspected recurrence in subcarinal lymph nodes.  She underwent XRT completing in August 2019.  PET scan results as above.  Unclear if this is a recurrence or second primary. 3. 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  Oncotype testing was not performed as this would not change the treatment plan.  Patient completed 5 years of letrozole in November 2019.  Her most recent mammogram on October 31, 2019 was reported BI-RADS 1.  Repeat in 1 year. 4.  Osteoporosis: Patient's most recent bone mineral density on October 31, 2019 reported T score of -3.1 which is unchanged from 1 year prior. Patient last received Prolia on October 06, 2019.   5.  Renal insufficiency:  Patient creatinine continues to trend up slightly.  Encouraged increased fluid intake other than coffee and soda. 6.  Nausea: Continue current oral antiemetics as prescribed.    Patient expressed understanding and was in agreement with this plan. She also understands that She can call clinic at any time with any questions, concerns, or complaints.    Lloyd Huger, MD 02/03/20 12:52 PM

## 2020-01-27 NOTE — Progress Notes (Signed)
Pt in for follow up reports started blood thinner and increased BP this week per heart MD.

## 2020-01-27 NOTE — Progress Notes (Signed)
HR 131, pt newly diagnosed afib. Per MD ok to treat if asymptomatic. Per Nursing pt is not symptomatic.

## 2020-01-27 NOTE — Progress Notes (Signed)
HR 137, ok to proceed per MD patient is asymptomatic.

## 2020-01-27 NOTE — Progress Notes (Signed)
Pt denies any concerns. HR elevated. Pt states she was recently diagnosed with A. fib. Asymptomatic. Ok to proceed with chemo per Dr. Grayland Ormond.

## 2020-01-27 NOTE — Progress Notes (Signed)
[place]  Telephone:([phone] [phone]   Name: [name] [name] [facility] Date: 01/27/2020 MRN: [id number]  DOB: 04/20/1928  Patient Care Team: Ezequiel Kayser, MD as PCP - General (Internal Medicine) Lloyd Huger, MD as Medical Oncologist (Medical Oncology)    REASON FOR CONSULTATION: [name] [name] is a 86 y.o. 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  Patient has declined biopsy to confirm recurrent lung cancer.  Patient is referred to palliative care to help address goals.  SOCIAL HISTORY:     reports that she has been smoking. She has a 70.00 pack-year smoking history. She has never used smokeless tobacco. She reports that she does not drink alcohol or use drugs.   Patient is widowed.  She lives at home with a daughter.  In total, she has 2 daughters and 2 sons.  Patient previously worked in [employer].  ADVANCE DIRECTIVES:  Not on file  CODE STATUS: DNR in [place]: Past Medical History:  Diagnosis Date  . Breast cancer ([place]) 08/19/2012   LT LUMPECTOMY, radiation tx.   Marland Kitchen COPD (chronic obstructive pulmonary disease) ([place])   . Hailey-hailey disease   . Hyperlipemia   . Hypertension   . Lung cancer ([place]) 2014   RT LUNG, radiation tx  . Migraine   . Myocardial infarction ([place])   . Osteoporosis   . Personal history of radiation therapy 2013   left breast ca  . Personal history of radiation therapy 2014   lung ca  . Radiation 2013   FOR BREAST CA  . Radiation 2014   FOR LUNG CA  . Renal artery stenosis ([place])     PAST SURGICAL HISTORY:  Past Surgical History:  Procedure Laterality Date  . ABDOMINAL HYSTERECTOMY    . APPENDECTOMY    . BREAST BIOPSY Left  08/03/2012   invasive mammary carcinoma, u/s guided bx  . 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 cardiac stents    . PARATHYROIDECTOMY    . TONSILLECTOMY      HEMATOLOGY/ONCOLOGY HISTORY:  Oncology History   No history exists.    ALLERGIES:  is allergic to bactrim [sulfamethoxazole-trimethoprim]; ibuprofen; macrobid [nitrofurantoin monohyd macro]; and tape.  MEDICATIONS:  Current Outpatient Medications  Medication Sig Dispense Refill  . albuterol (PROAIR HFA) 108 (90 Base) MCG/ACT inhaler Inhale 2 puffs into the lungs every 6 (six) hours as needed for wheezing or shortness of breath.     Marland Kitchen amLODipine (NORVASC) 2.5 MG tablet Take 2.5 mg by mouth daily.    Marland Kitchen aspirin 81 MG EC tablet Take 81 mg by mouth daily.     . calcium-vitamin D (OSCAL WITH D) 500-200 MG-UNIT TABS tablet Take by mouth.    Arne Cleveland 2.5 MG TABS tablet Take 2.5 mg by mouth 2 (two) times daily.    Marland Kitchen lisinopril (ZESTRIL) 2.5 MG tablet Take 1 tablet by mouth daily.    . metoprolol succinate (TOPROL-XL) 25 MG 24 hr tablet Take 25 mg by mouth 2 (two) times daily.     . Multiple Vitamin (MULTIVITAMIN) tablet Take by mouth.    . ondansetron (ZOFRAN) 8 MG tablet Take 1 tablet (8 mg total) by mouth 2 (two)  times daily as needed for refractory nausea / vomiting. 60 tablet 1  . prochlorperazine (COMPAZINE) 10 MG tablet Take 1 tablet (10 mg total) by mouth every 6 (six) hours as needed (Nausea or vomiting). 60 tablet 2  . simvastatin (ZOCOR) 20 MG tablet Take 20 mg by mouth at bedtime.    . traMADol (ULTRAM) 50 MG tablet Take 1 tablet (50 mg total) by mouth every 12 (twelve) hours as needed. (Patient not taking: Reported on 01/27/2020) 12 tablet 0   No current facility-administered medications for this visit.    VITAL SIGNS: There were no vitals taken for this visit. There were no vitals filed for this visit.  Estimated body mass index is 19.33 kg/m as calculated  from the following:   Height as of 11/28/19: 5\' 3"  (1.6 m).   Weight as of an earlier encounter on 01/27/20: 109 lb 1.6 oz (49.5 kg).  LABS: CBC:    Component Value Date/Time   WBC 7.4 01/27/2020 0840   HGB 11.9 (L) 01/27/2020 0840   HGB 12.9 03/05/2015 2050   HCT 37.5 01/27/2020 0840   HCT 39.8 03/05/2015 2050   PLT 293 01/27/2020 0840   PLT 222 03/05/2015 2050   MCV 92.8 01/27/2020 0840   MCV 93 03/05/2015 2050   NEUTROABS 6.2 01/27/2020 0840   NEUTROABS 10.2 (H) 03/05/2015 2050   LYMPHSABS 0.7 01/27/2020 0840   LYMPHSABS 1.8 03/05/2015 2050   MONOABS 0.4 01/27/2020 0840   MONOABS 1.2 (H) 03/05/2015 2050   EOSABS 0.1 01/27/2020 0840   EOSABS 0.2 03/05/2015 2050   BASOSABS 0.0 01/27/2020 0840   BASOSABS 0.1 03/05/2015 2050   Comprehensive Metabolic Panel:    Component Value Date/Time   NA 137 01/27/2020 0840   NA 136 03/05/2015 2050   K 4.6 01/27/2020 0840   K 3.9 03/05/2015 2050   CL 103 01/27/2020 0840   CL 107 03/05/2015 2050   CO2 25 01/27/2020 0840   CO2 22 03/05/2015 2050   BUN 40 (H) 01/27/2020 0840   BUN 38 (H) 03/05/2015 2050   CREATININE 1.24 (H) 01/27/2020 0840   CREATININE 1.47 (H) 03/05/2015 2050   GLUCOSE 121 (H) 01/27/2020 0840   GLUCOSE 116 (H) 03/05/2015 2050   CALCIUM 9.1 01/27/2020 0840   CALCIUM 8.5 (L) 03/05/2015 2050   AST 28 01/27/2020 0840   ALT 23 01/27/2020 0840   ALKPHOS 87 01/27/2020 0840   BILITOT 0.6 01/27/2020 0840   PROT 7.0 01/27/2020 0840   ALBUMIN 3.4 (L) 01/27/2020 0840    RADIOGRAPHIC STUDIES: VAS US RENAL ARTERY DUPLEX  Result Date: 01/09/2020 ABDOMINAL VISCERAL Indications: Hypertension Comparison Study: 11/22/2018 Performing Technologist: Almira Coaster RVS  Examination Guidelines: A complete evaluation includes B-mode imaging, spectral Doppler, color Doppler, and power Doppler as needed of all accessible portions of each vessel. Bilateral testing is considered an integral part of a complete examination. Limited  examinations for reoccurring indications may be performed as noted.  Duplex Findings: +------------+--------+--------+------+--------+ Mesenteric  PSV cm/sEDV cm/sPlaqueComments +------------+--------+--------+------+--------+ Aorta Prox     79      22                  +------------+--------+--------+------+--------+ Aorta Mid      83      24                  +------------+--------+--------+------+--------+ Aorta Distal   36      10                  +------------+--------+--------+------+--------+  +------------------+--------+--------+-------+  Right Renal ArteryPSV cm/sEDV cm/sComment +------------------+--------+--------+-------+ Origin              117      42           +------------------+--------+--------+-------+ Proximal             93      25           +------------------+--------+--------+-------+ Mid                  76      18           +------------------+--------+--------+-------+ Distal               72      26           +------------------+--------+--------+-------+ +-----------------+--------+--------+-------+ Left Renal ArteryPSV cm/sEDV cm/sComment +-----------------+--------+--------+-------+ Origin              71      17           +-----------------+--------+--------+-------+ Proximal            69      15           +-----------------+--------+--------+-------+ Mid                 49      13           +-----------------+--------+--------+-------+ Distal              58      14           +-----------------+--------+--------+-------+  +------------------+----+------------------+----+ Right Kidney          Left Kidney            +------------------+----+------------------+----+ RAR (manual)      .71 RAR (manual)      1.17 +------------------+----+------------------+----+ Kidney length (cm)9.66Kidney length (cm)8.40 +------------------+----+------------------+----+  Summary: Largest Aortic Diameter: 3.3 cm   Renal:  Right: No evidence of right renal artery stenosis. Normal size right        kidney. RRV flow present. Left:  No evidence of left renal artery stenosis. Abnormal size for        the left kidney. LRV flow present.         The bilateral kidney diameters appear decreased when compared        to the previous exam on 11/22/18. Known distal AAA noted when        compared to the previous CT on 11/01/19.  *See table(s) above for measurements and observations.  Diagnosing physician: Hortencia Pilar MD Electronically signed by Hortencia Pilar MD on 01/09/2020 at 1:49:18 PM.    Final     PERFORMANCE STATUS (ECOG) : 1 - Symptomatic but completely ambulatory  Review of Systems Unless otherwise noted, a complete review of systems is negative.  Physical Exam General: NAD, thin, bilateral temporal wasting Pulmonary: Unlabored Extremities: no edema, no joint deformities Skin: no rashes Neurological: Weakness but otherwise nonfocal  IMPRESSION: Routine follow-up visit today.  Patient was seen in the infusion area.  Patient reports that she is doing well.  She denies any significant changes or concerns.  No distressing symptoms were reported today.  Patient continues to endorse poor appetite overall.  She was referred to dietitian who is now following.  Weight is actually stable over the past couple of months.  We did discuss possible option of future appetite stimulants if needed but for now would just follow weight trends.  Will  benefit from clarification regarding ACP/MOST form.  I offered to call patient's daughter but patient says that she is currently working.  PLAN: -Continue current scope of treatment -Future conversation regarding ACP/MOST form -Follow-up virtual visit in 1 to 2 months  Patient expressed understanding and was in agreement with this plan. She also understands that She can call the clinic at any time with any questions, concerns, or complaints.     Time Total: 15  minutes  Visit consisted of counseling and education dealing with the complex and emotionally intense issues of symptom management and palliative care in the setting of serious and potentially life-threatening illness.Greater than 50%  of this time was spent counseling and coordinating care related to the above assessment and plan.  Signed by: Altha Harm, PhD, NP-C

## 2020-01-30 ENCOUNTER — Ambulatory Visit
Admission: RE | Admit: 2020-01-30 | Discharge: 2020-01-30 | Disposition: A | Discharge status: Home / Self Care | Payer: Medicare Other | Source: Ambulatory Visit | Attending: Radiation Oncology | Admitting: Radiation Oncology

## 2020-01-30 DIAGNOSIS — C3432 Malignant neoplasm of lower lobe, left bronchus or lung: Secondary | ICD-10-CM | POA: Diagnosis not present

## 2020-01-31 ENCOUNTER — Ambulatory Visit
Admission: RE | Admit: 2020-01-31 | Discharge: 2020-01-31 | Disposition: A | Discharge status: Home / Self Care | Payer: Medicare Other | Source: Ambulatory Visit | Attending: Radiation Oncology | Admitting: Radiation Oncology

## 2020-01-31 DIAGNOSIS — C3432 Malignant neoplasm of lower lobe, left bronchus or lung: Secondary | ICD-10-CM | POA: Diagnosis not present

## 2020-02-01 ENCOUNTER — Ambulatory Visit
Admission: RE | Admit: 2020-02-01 | Discharge: 2020-02-01 | Disposition: A | Discharge status: Home / Self Care | Payer: Medicare Other | Source: Ambulatory Visit | Attending: Radiation Oncology | Admitting: Radiation Oncology

## 2020-02-01 DIAGNOSIS — C3432 Malignant neoplasm of lower lobe, left bronchus or lung: Secondary | ICD-10-CM | POA: Diagnosis not present

## 2020-02-02 ENCOUNTER — Ambulatory Visit
Admission: RE | Admit: 2020-02-02 | Discharge: 2020-02-02 | Disposition: A | Discharge status: Home / Self Care | Payer: Medicare Other | Source: Ambulatory Visit | Attending: Radiation Oncology | Admitting: Radiation Oncology

## 2020-02-02 ENCOUNTER — Encounter: Payer: Self-pay | Admitting: Oncology

## 2020-02-02 DIAGNOSIS — C3432 Malignant neoplasm of lower lobe, left bronchus or lung: Secondary | ICD-10-CM | POA: Diagnosis not present

## 2020-02-03 ENCOUNTER — Inpatient Hospital Stay (HOSPITAL_BASED_OUTPATIENT_CLINIC_OR_DEPARTMENT_OTHER): Payer: Medicare Other | Admitting: Oncology

## 2020-02-03 ENCOUNTER — Ambulatory Visit: Admission: RE | Admit: 2020-02-03 | Payer: Medicare Other | Source: Ambulatory Visit

## 2020-02-03 ENCOUNTER — Ambulatory Visit: Payer: Medicare Other

## 2020-02-03 ENCOUNTER — Inpatient Hospital Stay: Payer: Medicare Other

## 2020-02-03 VITALS — BP 121/78 | HR 110 | Temp 97.9°F | Wt 109.2 lb

## 2020-02-03 DIAGNOSIS — R918 Other nonspecific abnormal finding of lung field: Secondary | ICD-10-CM

## 2020-02-03 DIAGNOSIS — Z5111 Encounter for antineoplastic chemotherapy: Secondary | ICD-10-CM | POA: Diagnosis not present

## 2020-02-03 LAB — CBC WITH DIFFERENTIAL/PLATELET
Abs Immature Granulocytes: 0.02 10*3/uL (ref 0.00–0.07)
Basophils Absolute: 0 10*3/uL (ref 0.0–0.1)
Basophils Relative: 1 %
Eosinophils Absolute: 0.1 10*3/uL (ref 0.0–0.5)
Eosinophils Relative: 2 %
HCT: 36.8 % (ref 36.0–46.0)
Hemoglobin: 11.7 g/dL — ABNORMAL LOW (ref 12.0–15.0)
Immature Granulocytes: 1 %
Lymphocytes Relative: 12 %
Lymphs Abs: 0.5 10*3/uL — ABNORMAL LOW (ref 0.7–4.0)
MCH: 29.5 pg (ref 26.0–34.0)
MCHC: 31.8 g/dL (ref 30.0–36.0)
MCV: 92.9 fL (ref 80.0–100.0)
Monocytes Absolute: 0.2 10*3/uL (ref 0.1–1.0)
Monocytes Relative: 6 %
Neutro Abs: 3.2 10*3/uL (ref 1.7–7.7)
Neutrophils Relative %: 78 %
Platelets: 289 10*3/uL (ref 150–400)
RBC: 3.96 MIL/uL (ref 3.87–5.11)
RDW: 14.4 % (ref 11.5–15.5)
WBC: 4 10*3/uL (ref 4.0–10.5)
nRBC: 0 % (ref 0.0–0.2)

## 2020-02-03 LAB — COMPREHENSIVE METABOLIC PANEL
ALT: 24 U/L (ref 0–44)
AST: 22 U/L (ref 15–41)
Albumin: 3.4 g/dL — ABNORMAL LOW (ref 3.5–5.0)
Alkaline Phosphatase: 88 U/L (ref 38–126)
Anion gap: 9 (ref 5–15)
BUN: 38 mg/dL — ABNORMAL HIGH (ref 8–23)
CO2: 25 mmol/L (ref 22–32)
Calcium: 8.8 mg/dL — ABNORMAL LOW (ref 8.9–10.3)
Chloride: 105 mmol/L (ref 98–111)
Creatinine, Ser: 1.39 mg/dL — ABNORMAL HIGH (ref 0.44–1.00)
GFR calc Af Amer: 39 mL/min — ABNORMAL LOW (ref >60)
GFR calc non Af Amer: 33 mL/min — ABNORMAL LOW (ref >60)
Glucose, Bld: 94 mg/dL (ref 70–99)
Potassium: 5.1 mmol/L (ref 3.5–5.1)
Sodium: 139 mmol/L (ref 135–145)
Total Bilirubin: 0.6 mg/dL (ref 0.3–1.2)
Total Protein: 6.9 g/dL (ref 6.5–8.1)

## 2020-02-03 MED ORDER — SODIUM CHLORIDE 0.9 % IV SOLN
90.8000 mg | Freq: Once | INTRAVENOUS | Status: AC
  Administered 2020-02-03: 90 mg via INTRAVENOUS
  Filled 2020-02-03: qty 9

## 2020-02-03 MED ORDER — SODIUM CHLORIDE 0.9 % IV SOLN
Freq: Once | INTRAVENOUS | Status: AC
  Filled 2020-02-03: qty 250

## 2020-02-03 MED ORDER — PALONOSETRON HCL INJECTION 0.25 MG/5ML
0.2500 mg | Freq: Once | INTRAVENOUS | Status: AC
  Administered 2020-02-03: 0.25 mg via INTRAVENOUS
  Filled 2020-02-03: qty 5

## 2020-02-03 MED ORDER — SODIUM CHLORIDE 0.9 % IV SOLN
10.0000 mg | Freq: Once | INTRAVENOUS | Status: AC
  Administered 2020-02-03: 10 mg via INTRAVENOUS
  Filled 2020-02-03: qty 10

## 2020-02-03 MED ORDER — SODIUM CHLORIDE 0.9 % IV SOLN
45.0000 mg/m2 | Freq: Once | INTRAVENOUS | Status: AC
  Administered 2020-02-03: 66 mg via INTRAVENOUS
  Filled 2020-02-03: qty 11

## 2020-02-03 MED ORDER — FAMOTIDINE IN NACL 20-0.9 MG/50ML-% IV SOLN
20.0000 mg | Freq: Once | INTRAVENOUS | Status: AC
  Administered 2020-02-03: 20 mg via INTRAVENOUS
  Filled 2020-02-03: qty 50

## 2020-02-03 MED ORDER — DIPHENHYDRAMINE HCL 50 MG/ML IJ SOLN
25.0000 mg | Freq: Once | INTRAMUSCULAR | Status: AC
  Administered 2020-02-03: 25 mg via INTRAVENOUS
  Filled 2020-02-03: qty 1

## 2020-02-03 NOTE — Progress Notes (Signed)
[place]  [phone] [phone]  ID: [name] [name] OB: 1928/01/14  MR#: [id number]  DUK#:[id number]  Patient Care Team: Ezequiel Kayser, MD as PCP - General (Internal Medicine) Lloyd Huger, MD as Medical Oncologist (Medical Oncology)   CHIEF COMPLAINT: At least stage IIa left lower lobe lung malignancy.    INTERVAL HISTORY: Patient returns to clinic today for further evaluation and consideration of cycle 4 of weekly carboplatinum and Taxol. She continues to have decreased p.o. intake, but otherwise feels well. She admits to increased nausea on the day after her treatments, but otherwise is tolerating them well.  She does not complain of pain today. She has no neurologic complaints.  She denies any recent fevers or illnesses.  She denies any chest pain, shortness of breath, cough, or hemoptysis. She denies any nausea, vomiting, constipation, or diarrhea. She has no urinary complaints. Patient offers no further specific complaints today.  REVIEW OF SYSTEMS:   Review of Systems  Constitutional: Negative.  Negative for fever, malaise/fatigue and weight loss.  Respiratory: Negative.  Negative for cough and shortness of breath.   Cardiovascular: Negative.  Negative for chest pain and leg swelling.  Gastrointestinal: Negative.  Negative for abdominal pain, blood in stool, constipation, diarrhea, nausea and vomiting.  Genitourinary: Negative.  Negative for flank pain.  Musculoskeletal: Negative for back pain.  Skin: Negative.  Negative for rash.  Neurological: Negative.  Negative for tingling, sensory change, focal weakness, weakness and headaches.  Psychiatric/Behavioral: Negative.  The patient is not nervous/anxious and does not have insomnia.     As per HPI. Otherwise, a complete review of systems is negative.  PAST MEDICAL HISTORY: Past Medical History:  Diagnosis Date  . Breast cancer ([place]) 08/19/2012   LT LUMPECTOMY, radiation  tx.   Marland Kitchen COPD (chronic obstructive pulmonary disease) ([place])   . Hailey-hailey disease   . Hyperlipemia   . Hypertension   . Lung cancer ([place]) 2014   RT LUNG, radiation tx  . Migraine   . Myocardial infarction ([place])   . Osteoporosis   . Personal history of radiation therapy 2013   left breast ca  . Personal history of radiation therapy 2014   lung ca  . Radiation 2013   FOR BREAST CA  . Radiation 2014   FOR LUNG CA  . Renal artery stenosis ([place])     PAST SURGICAL HISTORY: Past Surgical History:  Procedure Laterality Date  . ABDOMINAL HYSTERECTOMY    . APPENDECTOMY    . BREAST BIOPSY Left 08/03/2012   invasive mammary carcinoma, u/s guided bx  . 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 cardiac stents    . PARATHYROIDECTOMY    . TONSILLECTOMY      FAMILY HISTORY Family History  Problem Relation Age of Onset  . Breast cancer Sister 19       ADVANCED DIRECTIVES:    HEALTH MAINTENANCE: Social History   Tobacco Use  . Smoking status: Current Every Day Smoker    Packs/day: 1.00    Years: 70.00    Pack years: 70.00  . Smokeless tobacco: Never Used  Substance Use Topics  . Alcohol use: No  . Drug use: No      Allergies  Allergen Reactions  . Bactrim [Sulfamethoxazole-Trimethoprim] Other (See Comments)    Reduced kidney function.  Per Dr. Holley Raring should not take  . Ibuprofen     Reduced kidney function. Per Dr. Holley Raring should  not take  . Macrobid [employer] Macro] Other (See Comments)    Reduced Kidney function.  Should no longer take per Dr. Holley Raring  . Tape Rash    Current Outpatient Medications  Medication Sig Dispense Refill  . albuterol (PROAIR HFA) 108 (90 Base) MCG/ACT inhaler Inhale 2 puffs into the lungs every 6 (six) hours as needed for wheezing or shortness of breath.     . calcium-vitamin D (OSCAL WITH D) 500-200 MG-UNIT TABS tablet Take by mouth.    [name] [name] 2.5 MG TABS  tablet Take 2.5 mg by mouth 2 (two) times daily.    . metoprolol succinate (TOPROL-XL) 25 MG 24 hr tablet Take 25 mg by mouth 2 (two) times daily.     . Multiple Vitamin (MULTIVITAMIN) tablet Take by mouth.    . ondansetron (ZOFRAN) 8 MG tablet Take 1 tablet (8 mg total) by mouth 2 (two) times daily as needed for refractory nausea / vomiting. 60 tablet 1  . prochlorperazine (COMPAZINE) 10 MG tablet Take 1 tablet (10 mg total) by mouth every 6 (six) hours as needed (Nausea or vomiting). 60 tablet 2  . simvastatin (ZOCOR) 20 MG tablet Take 20 mg by mouth at bedtime.    . traMADol (ULTRAM) 50 MG tablet Take 1 tablet (50 mg total) by mouth every 12 (twelve) hours as needed. 12 tablet 0  . amLODipine (NORVASC) 2.5 MG tablet Take 2.5 mg by mouth daily.    Marland Kitchen aspirin 81 MG EC tablet Take 81 mg by mouth daily.     Marland Kitchen lisinopril (ZESTRIL) 2.5 MG tablet Take 1 tablet by mouth daily.     No current facility-administered medications for this visit.    OBJECTIVE: Vitals:   02/10/20 0855  BP: 122/86  Pulse: (!) 140  Temp: 97.6 F (36.4 C)     Body mass index is 19.89 kg/m.    ECOG FS:0 - Asymptomatic  General: Thin, no acute distress. Eyes: Pink conjunctiva, anicteric sclera. HEENT: Normocephalic, moist mucous membranes. Lungs: No audible wheezing or coughing. Heart: Tachycardic. Abdomen: Soft, nontender, no obvious distention. Musculoskeletal: No edema, cyanosis, or clubbing. Neuro: Alert, answering all questions appropriately. Cranial nerves grossly intact. Skin: No rashes or petechiae noted. Psych: Normal affect.  LAB RESULTS:  Lab Results  Component Value Date   NA 137 02/10/2020   K 5.1 02/10/2020   CL 106 02/10/2020   CO2 24 02/10/2020   GLUCOSE 108 (H) 02/10/2020   BUN 32 (H) 02/10/2020   CREATININE 1.25 (H) 02/10/2020   CALCIUM 8.5 (L) 02/10/2020   PROT 6.8 02/10/2020   ALBUMIN 3.5 02/10/2020   AST 34 02/10/2020   ALT 32 02/10/2020   ALKPHOS 96 02/10/2020   BILITOT 0.4  02/10/2020   GFRNONAA 38 (L) 02/10/2020   GFRAA 44 (L) 02/10/2020    Lab Results  Component Value Date   WBC 3.8 (L) 02/10/2020   NEUTROABS 2.8 02/10/2020   HGB 11.1 (L) 02/10/2020   HCT 35.0 (L) 02/10/2020   MCV 92.8 02/10/2020   PLT 238 02/10/2020     STUDIES: No results found.  ASSESSMENT: At least stage IIa left lower lobe lung malignancy.   PLAN:    1.  At least stage IIa left lower lobe lung malignancy: Patient's initial malignancy was in the right upper lobe, therefore it is unclear if this is a second primary or a metastatic lesion.  PET scan results from November 09, 2019 reviewed independently confirming hypermetabolic lesion.  Patient has declined biopsy  to confirm diagnosis. Continue daily XRT completing treatment on 03/07/2020. Will delay cycle 4 of chemotherapy today secondary to relative dehydration and tachycardia. Patient will instead receive 1 L of IV fluids. Return to clinic in 1 week for further evaluation and reconsideration of treatment.   2.  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  PET scan results from Apr 02, 2018 revealed suspected recurrence in subcarinal lymph nodes.  She underwent XRT completing in August 2019.  PET scan results as above.  Unclear if this is a recurrence or second primary. 3. 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  Oncotype testing was not performed as this would not change the treatment plan.  Patient completed 5 years of letrozole in November 2019.  Her most recent mammogram on October 31, 2019 was reported BI-RADS 1.  Repeat in 1 year. 4.  Osteoporosis: Patient's most recent bone mineral density on October 31, 2019 reported T score of -3.1 which is unchanged from 1 year prior. Patient last received Prolia on October 06, 2019.   5.  Renal insufficiency: Although patient's creatinine has trended down slightly, will give IV fluids as above and delay treatment 1 week. 6.  Nausea: Continue current oral antiemetics as prescribed.   7. Anemia: Patient's hemoglobin has trended down slightly to 11.1.  Patient expressed understanding and was in agreement with this plan. She also understands that She can call clinic at any time with any questions, concerns, or complaints.    Lloyd Huger, MD 02/10/20 11:15 AM

## 2020-02-03 NOTE — Progress Notes (Signed)
Per MD ok to decrease dose to 90mg  as pt is not eating much.

## 2020-02-06 ENCOUNTER — Ambulatory Visit
Admission: RE | Admit: 2020-02-06 | Discharge: 2020-02-06 | Disposition: A | Discharge status: Home / Self Care | Payer: Medicare Other | Source: Ambulatory Visit | Attending: Radiation Oncology | Admitting: Radiation Oncology

## 2020-02-06 DIAGNOSIS — C3432 Malignant neoplasm of lower lobe, left bronchus or lung: Secondary | ICD-10-CM | POA: Diagnosis not present

## 2020-02-07 ENCOUNTER — Ambulatory Visit
Admission: RE | Admit: 2020-02-07 | Discharge: 2020-02-07 | Disposition: A | Discharge status: Home / Self Care | Payer: Medicare Other | Source: Ambulatory Visit | Attending: Radiation Oncology | Admitting: Radiation Oncology

## 2020-02-07 DIAGNOSIS — C3432 Malignant neoplasm of lower lobe, left bronchus or lung: Secondary | ICD-10-CM | POA: Diagnosis not present

## 2020-02-08 ENCOUNTER — Ambulatory Visit
Admission: RE | Admit: 2020-02-08 | Discharge: 2020-02-08 | Disposition: A | Discharge status: Home / Self Care | Payer: Medicare Other | Source: Ambulatory Visit | Attending: Radiation Oncology | Admitting: Radiation Oncology

## 2020-02-08 DIAGNOSIS — C3432 Malignant neoplasm of lower lobe, left bronchus or lung: Secondary | ICD-10-CM | POA: Diagnosis not present

## 2020-02-09 ENCOUNTER — Ambulatory Visit
Admission: RE | Admit: 2020-02-09 | Discharge: 2020-02-09 | Disposition: A | Discharge status: Home / Self Care | Payer: Medicare Other | Source: Ambulatory Visit | Attending: Radiation Oncology | Admitting: Radiation Oncology

## 2020-02-09 ENCOUNTER — Encounter: Payer: Self-pay | Admitting: Oncology

## 2020-02-09 ENCOUNTER — Other Ambulatory Visit: Payer: Self-pay

## 2020-02-09 DIAGNOSIS — C3432 Malignant neoplasm of lower lobe, left bronchus or lung: Secondary | ICD-10-CM | POA: Diagnosis not present

## 2020-02-10 ENCOUNTER — Inpatient Hospital Stay: Payer: Medicare Other

## 2020-02-10 ENCOUNTER — Encounter: Payer: Self-pay | Admitting: Oncology

## 2020-02-10 ENCOUNTER — Ambulatory Visit
Admission: RE | Admit: 2020-02-10 | Discharge: 2020-02-10 | Disposition: A | Discharge status: Home / Self Care | Payer: Medicare Other | Source: Ambulatory Visit | Attending: Radiation Oncology | Admitting: Radiation Oncology

## 2020-02-10 ENCOUNTER — Inpatient Hospital Stay (HOSPITAL_BASED_OUTPATIENT_CLINIC_OR_DEPARTMENT_OTHER): Payer: Medicare Other | Admitting: Oncology

## 2020-02-10 VITALS — HR 98 | Resp 20

## 2020-02-10 VITALS — BP 122/86 | HR 140 | Temp 97.6°F | Wt 112.3 lb

## 2020-02-10 DIAGNOSIS — C3432 Malignant neoplasm of lower lobe, left bronchus or lung: Secondary | ICD-10-CM | POA: Diagnosis not present

## 2020-02-10 DIAGNOSIS — Z5111 Encounter for antineoplastic chemotherapy: Secondary | ICD-10-CM | POA: Diagnosis not present

## 2020-02-10 DIAGNOSIS — R918 Other nonspecific abnormal finding of lung field: Secondary | ICD-10-CM

## 2020-02-10 DIAGNOSIS — M810 Age-related osteoporosis without current pathological fracture: Secondary | ICD-10-CM

## 2020-02-10 LAB — CBC WITH DIFFERENTIAL/PLATELET
Abs Immature Granulocytes: 0.03 10*3/uL (ref 0.00–0.07)
Basophils Absolute: 0 10*3/uL (ref 0.0–0.1)
Basophils Relative: 1 %
Eosinophils Absolute: 0 10*3/uL (ref 0.0–0.5)
Eosinophils Relative: 1 %
HCT: 35 % — ABNORMAL LOW (ref 36.0–46.0)
Hemoglobin: 11.1 g/dL — ABNORMAL LOW (ref 12.0–15.0)
Immature Granulocytes: 1 %
Lymphocytes Relative: 16 %
Lymphs Abs: 0.6 10*3/uL — ABNORMAL LOW (ref 0.7–4.0)
MCH: 29.4 pg (ref 26.0–34.0)
MCHC: 31.7 g/dL (ref 30.0–36.0)
MCV: 92.8 fL (ref 80.0–100.0)
Monocytes Absolute: 0.3 10*3/uL (ref 0.1–1.0)
Monocytes Relative: 7 %
Neutro Abs: 2.8 10*3/uL (ref 1.7–7.7)
Neutrophils Relative %: 74 %
Platelets: 238 10*3/uL (ref 150–400)
RBC: 3.77 MIL/uL — ABNORMAL LOW (ref 3.87–5.11)
RDW: 15.4 % (ref 11.5–15.5)
WBC: 3.8 10*3/uL — ABNORMAL LOW (ref 4.0–10.5)
nRBC: 0 % (ref 0.0–0.2)

## 2020-02-10 LAB — COMPREHENSIVE METABOLIC PANEL
ALT: 32 U/L (ref 0–44)
AST: 34 U/L (ref 15–41)
Albumin: 3.5 g/dL (ref 3.5–5.0)
Alkaline Phosphatase: 96 U/L (ref 38–126)
Anion gap: 7 (ref 5–15)
BUN: 32 mg/dL — ABNORMAL HIGH (ref 8–23)
CO2: 24 mmol/L (ref 22–32)
Calcium: 8.5 mg/dL — ABNORMAL LOW (ref 8.9–10.3)
Chloride: 106 mmol/L (ref 98–111)
Creatinine, Ser: 1.25 mg/dL — ABNORMAL HIGH (ref 0.44–1.00)
GFR calc Af Amer: 44 mL/min — ABNORMAL LOW (ref >60)
GFR calc non Af Amer: 38 mL/min — ABNORMAL LOW (ref >60)
Glucose, Bld: 108 mg/dL — ABNORMAL HIGH (ref 70–99)
Potassium: 5.1 mmol/L (ref 3.5–5.1)
Sodium: 137 mmol/L (ref 135–145)
Total Bilirubin: 0.4 mg/dL (ref 0.3–1.2)
Total Protein: 6.8 g/dL (ref 6.5–8.1)

## 2020-02-10 MED ORDER — SODIUM CHLORIDE 0.9 % IV SOLN
Freq: Once | INTRAVENOUS | Status: AC
  Administered 2020-02-10: 1000 mL via INTRAVENOUS
  Filled 2020-02-10: qty 250

## 2020-02-11 NOTE — Progress Notes (Signed)
[place]  Telephone:([phone]) [phone] [phone]) [phone]  ID: [name] [name] OB: [birth year]  MR#: [id number]  BLT#:[id number]  Patient Care Team: Ezequiel Kayser, MD as PCP - General (Internal Medicine) Lloyd Huger, MD as Medical Oncologist (Medical Oncology)   CHIEF COMPLAINT: At least stage IIa left lower lobe lung malignancy.    INTERVAL HISTORY: Patient returns to clinic today for further evaluation and reconsideration of cycle 4 of weekly carboplatinum and Taxol.  She has increased weakness and fatigue and difficulty walking secondary to significant dyspnea on exertion.  She remains short of breath at rest as well.  She continues to have poor p.o. intake.  She does not complain of pain today. She has no neurologic complaints.  She denies any recent fevers or illnesses.  She denies any chest pain, cough, or hemoptysis.  She denies any nausea, vomiting, constipation, or diarrhea. She has no urinary complaints.  Patient offers no further specific complaints today.    REVIEW OF SYSTEMS:   Review of Systems  Constitutional: Negative.  Negative for fever, malaise/fatigue and weight loss.  Respiratory: Negative.  Negative for cough and shortness of breath.   Cardiovascular: Negative.  Negative for chest pain and leg swelling.  Gastrointestinal: Negative.  Negative for abdominal pain, blood in stool, constipation, diarrhea, nausea and vomiting.  Genitourinary: Negative.  Negative for flank pain.  Musculoskeletal: Negative for back pain.  Skin: Negative.  Negative for rash.  Neurological: Negative.  Negative for tingling, sensory change, focal weakness, weakness and headaches.  Psychiatric/Behavioral: Negative.  The patient is not nervous/anxious and does not have insomnia.     As per HPI. Otherwise, a complete review of systems is negative.  PAST MEDICAL HISTORY: Past Medical History:  Diagnosis Date  . Breast cancer ([place]) 08/19/2012   LT LUMPECTOMY,  radiation tx.   Marland Kitchen COPD (chronic obstructive pulmonary disease) ([place])   . Hailey-hailey disease   . Hyperlipemia   . Hypertension   . Lung cancer ([place]) 2014   RT LUNG, radiation tx  . Migraine   . Myocardial infarction ([place])   . Osteoporosis   . Personal history of radiation therapy 2013   left breast ca  . Personal history of radiation therapy 2014   lung ca  . Radiation 2013   FOR BREAST CA  . Radiation 2014   FOR LUNG CA  . Renal artery stenosis ([place])     PAST SURGICAL HISTORY: Past Surgical History:  Procedure Laterality Date  . ABDOMINAL HYSTERECTOMY    . APPENDECTOMY    . BREAST BIOPSY Left 08/03/2012   invasive mammary carcinoma, u/s guided bx  . 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 cardiac stents    . PARATHYROIDECTOMY    . TONSILLECTOMY      FAMILY HISTORY Family History  Problem Relation Age of Onset  . Breast cancer Sister 27       ADVANCED DIRECTIVES:    HEALTH MAINTENANCE: Social History   Tobacco Use  . Smoking status: Current Every Day Smoker    Packs/day: 1.00    Years: 70.00    Pack years: 70.00  . Smokeless tobacco: Never Used  Substance Use Topics  . Alcohol use: No  . Drug use: No      Allergies  Allergen Reactions  . Bactrim [Sulfamethoxazole-Trimethoprim] Other (See Comments)    Reduced kidney function.  Per Dr. Holley Raring should not take  . Ibuprofen  Reduced kidney function. Per Dr. Holley Raring should not take  . Macrobid [employer] Macro] Other (See Comments)    Reduced Kidney function.  Should no longer take per Dr. Holley Raring  . Tape Rash    No current facility-administered medications for this visit.   No current outpatient medications on file.   Facility-Administered Medications Ordered in Other Visits  Medication Dose Route Frequency Provider Last Rate Last Admin  . 0.9 %  sodium chloride infusion   Intravenous Continuous Lloyd Huger, MD  100 mL/hr at 02/17/20 1115 New Bag at 02/17/20 1115    OBJECTIVE: Vitals:   02/17/20 0939  BP: (!) 142/103  Pulse: (!) 114  Resp: (!) 22  Temp: (!) 96 F (35.6 C)  SpO2: 100%     Body mass index is 19.93 kg/m.    ECOG FS:0 - Asymptomatic  General: Well-developed, well-nourished, no acute distress. Eyes: Pink conjunctiva, anicteric sclera. HEENT: Normocephalic, moist mucous membranes. Lungs: No audible wheezing or coughing. Heart: Irregularly irregular. Abdomen: Soft, nontender, no obvious distention. Musculoskeletal: No edema, cyanosis, or clubbing. Neuro: Alert, answering all questions appropriately. Cranial nerves grossly intact. Skin: No rashes or petechiae noted. Psych: Normal affect.   LAB RESULTS:  Lab Results  Component Value Date   NA 139 02/17/2020   K 5.7 (H) 02/17/2020   CL 104 02/17/2020   CO2 27 02/17/2020   GLUCOSE 148 (H) 02/17/2020   BUN 31 (H) 02/17/2020   CREATININE 1.32 (H) 02/17/2020   CALCIUM 9.0 02/17/2020   PROT 6.9 02/17/2020   ALBUMIN 3.5 02/17/2020   AST 30 02/17/2020   ALT 30 02/17/2020   ALKPHOS 93 02/17/2020   BILITOT 0.6 02/17/2020   GFRNONAA 35 (L) 02/17/2020   GFRAA 41 (L) 02/17/2020    Lab Results  Component Value Date   WBC 4.7 02/17/2020   NEUTROABS 3.8 02/17/2020   HGB 12.1 02/17/2020   HCT 37.9 02/17/2020   MCV 93.8 02/17/2020   PLT 178 02/17/2020     STUDIES: No results found.  ASSESSMENT: At least stage IIa left lower lobe lung malignancy.   PLAN:    1.  At least stage IIa left lower lobe lung malignancy: Patient's initial malignancy was in the right upper lobe, therefore it is unclear if this is a second primary or a metastatic lesion.  PET scan results from November 09, 2019 reviewed independently confirming hypermetabolic lesion.  Patient has declined biopsy to confirm diagnosis. Continue daily XRT completing treatment on 03/07/2020.  We will once again delay cycle 4 of treatment today secondary to  uncontrolled atrial fibrillation.  Patient will be admitted to telemetry for further evaluation and monitoring.  Return to clinic in 1 week for reconsideration of treatment.  Patient's appointment is on February 24, 2020.  She has laboratory work scheduled at 8:15 AM and MD visit at 8:45 AM.  2.  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  PET scan results from Apr 02, 2018 revealed suspected recurrence in subcarinal lymph nodes.  She underwent XRT completing in August 2019.  PET scan results as above.  Unclear if this is a recurrence or second primary. 3. 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  Oncotype testing was not performed as this would not change the treatment plan.  Patient completed 5 years  of letrozole in November 2019.  Her most recent mammogram on October 31, 2019 was reported BI-RADS 1.  Repeat in 1 year. 4.  Osteoporosis: Patient's most recent bone mineral density on October 31, 2019 reported T score of -3.1 which is unchanged from 1 year prior. Patient last received Prolia on October 06, 2019.   5.  Renal insufficiency: Although patient's creatinine has trended down slightly, will give IV fluids as above and delay treatment 1 week. 6.  Nausea: Patient does not complain of this today.  Continue current oral antiemetics as prescribed.   7. Anemia: Resolved.  Although hemoglobin possibly elevated secondary hemoconcentration from dehydration. 8.  Atrial fibrillation with uncontrolled rate: Admit patient to telemetry. 9.  Hyperkalemia: Admission to telemetry as above. 10.  Renal insufficiency: Initiate IV fluids while clinic and continue during hospitalization.  Patient expressed understanding and was in agreement with this plan. She also understands that She can call clinic at any  time with any questions, concerns, or complaints.    Lloyd Huger, MD 02/17/20 1:20 PM

## 2020-02-13 ENCOUNTER — Other Ambulatory Visit: Payer: Self-pay | Admitting: Emergency Medicine

## 2020-02-13 ENCOUNTER — Encounter: Payer: Self-pay | Admitting: Oncology

## 2020-02-13 ENCOUNTER — Ambulatory Visit
Admission: RE | Admit: 2020-02-13 | Discharge: 2020-02-13 | Disposition: A | Discharge status: Home / Self Care | Payer: Medicare Other | Source: Ambulatory Visit | Attending: Radiation Oncology | Admitting: Radiation Oncology

## 2020-02-13 DIAGNOSIS — C3432 Malignant neoplasm of lower lobe, left bronchus or lung: Secondary | ICD-10-CM | POA: Diagnosis not present

## 2020-02-13 DIAGNOSIS — I4891 Unspecified atrial fibrillation: Secondary | ICD-10-CM

## 2020-02-14 ENCOUNTER — Ambulatory Visit
Admission: RE | Admit: 2020-02-14 | Discharge: 2020-02-14 | Disposition: A | Discharge status: Home / Self Care | Payer: Medicare Other | Source: Ambulatory Visit | Attending: Radiation Oncology | Admitting: Radiation Oncology

## 2020-02-15 ENCOUNTER — Ambulatory Visit
Admission: RE | Admit: 2020-02-15 | Discharge: 2020-02-15 | Disposition: A | Discharge status: Home / Self Care | Payer: Medicare Other | Source: Ambulatory Visit | Attending: Radiation Oncology | Admitting: Radiation Oncology

## 2020-02-16 ENCOUNTER — Ambulatory Visit
Admission: RE | Admit: 2020-02-16 | Discharge: 2020-02-16 | Disposition: A | Discharge status: Home / Self Care | Payer: Medicare Other | Source: Ambulatory Visit | Attending: Radiation Oncology | Admitting: Radiation Oncology

## 2020-02-16 DIAGNOSIS — C3432 Malignant neoplasm of lower lobe, left bronchus or lung: Secondary | ICD-10-CM | POA: Insufficient documentation

## 2020-02-17 ENCOUNTER — Inpatient Hospital Stay (HOSPITAL_BASED_OUTPATIENT_CLINIC_OR_DEPARTMENT_OTHER): Payer: Medicare Other | Admitting: Oncology

## 2020-02-17 ENCOUNTER — Ambulatory Visit
Admission: RE | Admit: 2020-02-17 | Discharge: 2020-02-17 | Disposition: A | Discharge status: Home / Self Care | Payer: Medicare Other | Source: Ambulatory Visit | Attending: Radiation Oncology | Admitting: Radiation Oncology

## 2020-02-17 ENCOUNTER — Inpatient Hospital Stay
Admission: AD | Admit: 2020-02-17 | Discharge: 2020-02-20 | DRG: 308 | Disposition: A | Discharge status: Home Health | Payer: Medicare Other | Source: Ambulatory Visit | Attending: Internal Medicine | Admitting: Internal Medicine

## 2020-02-17 ENCOUNTER — Other Ambulatory Visit: Payer: Self-pay

## 2020-02-17 ENCOUNTER — Ambulatory Visit: Payer: Medicare Other

## 2020-02-17 ENCOUNTER — Inpatient Hospital Stay: Payer: Medicare Other

## 2020-02-17 ENCOUNTER — Encounter: Payer: Self-pay | Admitting: Internal Medicine

## 2020-02-17 ENCOUNTER — Encounter: Payer: Self-pay | Admitting: Oncology

## 2020-02-17 ENCOUNTER — Inpatient Hospital Stay: Payer: Medicare Other | Attending: Oncology

## 2020-02-17 VITALS — BP 142/103 | HR 114 | Temp 96.0°F | Resp 22 | Wt 112.5 lb

## 2020-02-17 DIAGNOSIS — Z923 Personal history of irradiation: Secondary | ICD-10-CM

## 2020-02-17 DIAGNOSIS — J9811 Atelectasis: Secondary | ICD-10-CM | POA: Diagnosis present

## 2020-02-17 DIAGNOSIS — Z85118 Personal history of other malignant neoplasm of bronchus and lung: Secondary | ICD-10-CM

## 2020-02-17 DIAGNOSIS — Z20822 Contact with and (suspected) exposure to covid-19: Secondary | ICD-10-CM | POA: Diagnosis present

## 2020-02-17 DIAGNOSIS — Z79891 Long term (current) use of opiate analgesic: Secondary | ICD-10-CM

## 2020-02-17 DIAGNOSIS — E875 Hyperkalemia: Secondary | ICD-10-CM

## 2020-02-17 DIAGNOSIS — I4891 Unspecified atrial fibrillation: Principal | ICD-10-CM

## 2020-02-17 DIAGNOSIS — I5031 Acute diastolic (congestive) heart failure: Secondary | ICD-10-CM | POA: Diagnosis present

## 2020-02-17 DIAGNOSIS — C3432 Malignant neoplasm of lower lobe, left bronchus or lung: Secondary | ICD-10-CM | POA: Insufficient documentation

## 2020-02-17 DIAGNOSIS — Z853 Personal history of malignant neoplasm of breast: Secondary | ICD-10-CM

## 2020-02-17 DIAGNOSIS — R809 Proteinuria, unspecified: Secondary | ICD-10-CM | POA: Diagnosis present

## 2020-02-17 DIAGNOSIS — I712 Thoracic aortic aneurysm, without rupture: Secondary | ICD-10-CM | POA: Diagnosis present

## 2020-02-17 DIAGNOSIS — R918 Other nonspecific abnormal finding of lung field: Secondary | ICD-10-CM | POA: Diagnosis not present

## 2020-02-17 DIAGNOSIS — N179 Acute kidney failure, unspecified: Secondary | ICD-10-CM | POA: Diagnosis present

## 2020-02-17 DIAGNOSIS — I130 Hypertensive heart and chronic kidney disease with heart failure and stage 1 through stage 4 chronic kidney disease, or unspecified chronic kidney disease: Secondary | ICD-10-CM | POA: Diagnosis present

## 2020-02-17 DIAGNOSIS — Q828 Other specified congenital malformations of skin: Secondary | ICD-10-CM | POA: Diagnosis not present

## 2020-02-17 DIAGNOSIS — E210 Primary hyperparathyroidism: Secondary | ICD-10-CM | POA: Diagnosis present

## 2020-02-17 DIAGNOSIS — N1832 Chronic kidney disease, stage 3b: Secondary | ICD-10-CM | POA: Diagnosis present

## 2020-02-17 DIAGNOSIS — Z87891 Personal history of nicotine dependence: Secondary | ICD-10-CM

## 2020-02-17 DIAGNOSIS — I739 Peripheral vascular disease, unspecified: Secondary | ICD-10-CM | POA: Diagnosis present

## 2020-02-17 DIAGNOSIS — Z681 Body mass index (BMI) 19 or less, adult: Secondary | ICD-10-CM | POA: Diagnosis not present

## 2020-02-17 DIAGNOSIS — I509 Heart failure, unspecified: Secondary | ICD-10-CM | POA: Diagnosis not present

## 2020-02-17 DIAGNOSIS — R0602 Shortness of breath: Secondary | ICD-10-CM

## 2020-02-17 DIAGNOSIS — I701 Atherosclerosis of renal artery: Secondary | ICD-10-CM | POA: Diagnosis present

## 2020-02-17 DIAGNOSIS — Z9889 Other specified postprocedural states: Secondary | ICD-10-CM

## 2020-02-17 DIAGNOSIS — J439 Emphysema, unspecified: Secondary | ICD-10-CM | POA: Diagnosis present

## 2020-02-17 DIAGNOSIS — J918 Pleural effusion in other conditions classified elsewhere: Secondary | ICD-10-CM | POA: Diagnosis present

## 2020-02-17 DIAGNOSIS — Z7901 Long term (current) use of anticoagulants: Secondary | ICD-10-CM

## 2020-02-17 DIAGNOSIS — Z955 Presence of coronary angioplasty implant and graft: Secondary | ICD-10-CM

## 2020-02-17 DIAGNOSIS — M810 Age-related osteoporosis without current pathological fracture: Secondary | ICD-10-CM | POA: Diagnosis present

## 2020-02-17 DIAGNOSIS — Z79899 Other long term (current) drug therapy: Secondary | ICD-10-CM

## 2020-02-17 DIAGNOSIS — Z66 Do not resuscitate: Secondary | ICD-10-CM | POA: Diagnosis present

## 2020-02-17 DIAGNOSIS — I2510 Atherosclerotic heart disease of native coronary artery without angina pectoris: Secondary | ICD-10-CM | POA: Diagnosis present

## 2020-02-17 DIAGNOSIS — C3411 Malignant neoplasm of upper lobe, right bronchus or lung: Secondary | ICD-10-CM

## 2020-02-17 DIAGNOSIS — E785 Hyperlipidemia, unspecified: Secondary | ICD-10-CM | POA: Diagnosis present

## 2020-02-17 DIAGNOSIS — Z90710 Acquired absence of both cervix and uterus: Secondary | ICD-10-CM

## 2020-02-17 DIAGNOSIS — I252 Old myocardial infarction: Secondary | ICD-10-CM

## 2020-02-17 DIAGNOSIS — I10 Essential (primary) hypertension: Secondary | ICD-10-CM | POA: Diagnosis present

## 2020-02-17 DIAGNOSIS — Z803 Family history of malignant neoplasm of breast: Secondary | ICD-10-CM

## 2020-02-17 DIAGNOSIS — Z5111 Encounter for antineoplastic chemotherapy: Secondary | ICD-10-CM | POA: Insufficient documentation

## 2020-02-17 LAB — CBC WITH DIFFERENTIAL/PLATELET
Abs Immature Granulocytes: 0.02 10*3/uL (ref 0.00–0.07)
Basophils Absolute: 0 10*3/uL (ref 0.0–0.1)
Basophils Relative: 1 %
Eosinophils Absolute: 0 10*3/uL (ref 0.0–0.5)
Eosinophils Relative: 0 %
HCT: 37.9 % (ref 36.0–46.0)
Hemoglobin: 12.1 g/dL (ref 12.0–15.0)
Immature Granulocytes: 0 %
Lymphocytes Relative: 9 %
Lymphs Abs: 0.4 10*3/uL — ABNORMAL LOW (ref 0.7–4.0)
MCH: 30 pg (ref 26.0–34.0)
MCHC: 31.9 g/dL (ref 30.0–36.0)
MCV: 93.8 fL (ref 80.0–100.0)
Monocytes Absolute: 0.4 10*3/uL (ref 0.1–1.0)
Monocytes Relative: 9 %
Neutro Abs: 3.8 10*3/uL (ref 1.7–7.7)
Neutrophils Relative %: 81 %
Platelets: 178 10*3/uL (ref 150–400)
RBC: 4.04 MIL/uL (ref 3.87–5.11)
RDW: 17.9 % — ABNORMAL HIGH (ref 11.5–15.5)
WBC: 4.7 10*3/uL (ref 4.0–10.5)
nRBC: 0 % (ref 0.0–0.2)

## 2020-02-17 LAB — COMPREHENSIVE METABOLIC PANEL
ALT: 30 U/L (ref 0–44)
AST: 30 U/L (ref 15–41)
Albumin: 3.5 g/dL (ref 3.5–5.0)
Alkaline Phosphatase: 93 U/L (ref 38–126)
Anion gap: 8 (ref 5–15)
BUN: 31 mg/dL — ABNORMAL HIGH (ref 8–23)
CO2: 27 mmol/L (ref 22–32)
Calcium: 9 mg/dL (ref 8.9–10.3)
Chloride: 104 mmol/L (ref 98–111)
Creatinine, Ser: 1.32 mg/dL — ABNORMAL HIGH (ref 0.44–1.00)
GFR calc Af Amer: 41 mL/min — ABNORMAL LOW (ref >60)
GFR calc non Af Amer: 35 mL/min — ABNORMAL LOW (ref >60)
Glucose, Bld: 148 mg/dL — ABNORMAL HIGH (ref 70–99)
Potassium: 5.7 mmol/L — ABNORMAL HIGH (ref 3.5–5.1)
Sodium: 139 mmol/L (ref 135–145)
Total Bilirubin: 0.6 mg/dL (ref 0.3–1.2)
Total Protein: 6.9 g/dL (ref 6.5–8.1)

## 2020-02-17 LAB — BASIC METABOLIC PANEL
Anion gap: 9 (ref 5–15)
BUN: 32 mg/dL — ABNORMAL HIGH (ref 8–23)
CO2: 27 mmol/L (ref 22–32)
Calcium: 8.3 mg/dL — ABNORMAL LOW (ref 8.9–10.3)
Chloride: 104 mmol/L (ref 98–111)
Creatinine, Ser: 1.34 mg/dL — ABNORMAL HIGH (ref 0.44–1.00)
GFR calc Af Amer: 40 mL/min — ABNORMAL LOW (ref >60)
GFR calc non Af Amer: 35 mL/min — ABNORMAL LOW (ref >60)
Glucose, Bld: 102 mg/dL — ABNORMAL HIGH (ref 70–99)
Potassium: 4.8 mmol/L (ref 3.5–5.1)
Sodium: 140 mmol/L (ref 135–145)

## 2020-02-17 LAB — BRAIN NATRIURETIC PEPTIDE: B Natriuretic Peptide: 713 pg/mL — ABNORMAL HIGH (ref 0.0–100.0)

## 2020-02-17 LAB — GLUCOSE, CAPILLARY: Glucose-Capillary: 89 mg/dL (ref 70–99)

## 2020-02-17 LAB — TSH: TSH: 2.038 u[IU]/mL (ref 0.350–4.500)

## 2020-02-17 MED ORDER — SIMVASTATIN 20 MG PO TABS
20.0000 mg | ORAL_TABLET | Freq: Every day | ORAL | Status: DC
  Administered 2020-02-17: 21:00:00 20 mg via ORAL
  Filled 2020-02-17: qty 1

## 2020-02-17 MED ORDER — INSULIN REGULAR HUMAN 100 UNIT/ML IJ SOLN
10.0000 [IU] | Freq: Once | INTRAMUSCULAR | Status: AC
  Administered 2020-02-17: 10 [IU] via INTRAVENOUS
  Filled 2020-02-17: qty 10

## 2020-02-17 MED ORDER — ACETAMINOPHEN 325 MG PO TABS
650.0000 mg | ORAL_TABLET | Freq: Four times a day (QID) | ORAL | Status: DC | PRN
  Administered 2020-02-17: 650 mg via ORAL
  Filled 2020-02-17 (×2): qty 2

## 2020-02-17 MED ORDER — ACETAMINOPHEN 650 MG RE SUPP: 650.0000 mg | Freq: Four times a day (QID) | RECTAL | Status: DC | PRN

## 2020-02-17 MED ORDER — ONDANSETRON HCL 4 MG PO TABS: 4.0000 mg | ORAL_TABLET | Freq: Four times a day (QID) | ORAL | Status: DC | PRN

## 2020-02-17 MED ORDER — SODIUM CHLORIDE 0.9 % IV SOLN
INTRAVENOUS | Status: AC
  Filled 2020-02-17 (×2): qty 250

## 2020-02-17 MED ORDER — DILTIAZEM HCL-DEXTROSE 125-5 MG/125ML-% IV SOLN (PREMIX)
5.0000 mg/h | INTRAVENOUS | Status: DC
  Administered 2020-02-17: 5 mg/h via INTRAVENOUS
  Filled 2020-02-17: qty 125

## 2020-02-17 MED ORDER — ONDANSETRON HCL 4 MG/2ML IJ SOLN: 4.0000 mg | Freq: Four times a day (QID) | INTRAMUSCULAR | Status: DC | PRN

## 2020-02-17 MED ORDER — CALCIUM CARBONATE-VITAMIN D 500-200 MG-UNIT PO TABS
1.0000 | ORAL_TABLET | Freq: Every day | ORAL | Status: DC
  Administered 2020-02-18 – 2020-02-20 (×3): 1 via ORAL
  Filled 2020-02-17 (×3): qty 1

## 2020-02-17 MED ORDER — GUAIFENESIN 100 MG/5ML PO SOLN
5.0000 mL | Freq: Four times a day (QID) | ORAL | Status: DC | PRN
  Administered 2020-02-17: 21:00:00 100 mg via ORAL
  Filled 2020-02-17 (×2): qty 5

## 2020-02-17 MED ORDER — IPRATROPIUM-ALBUTEROL 0.5-2.5 (3) MG/3ML IN SOLN
3.0000 mL | Freq: Four times a day (QID) | RESPIRATORY_TRACT | Status: DC | PRN
  Administered 2020-02-17 – 2020-02-18 (×2): 3 mL via RESPIRATORY_TRACT
  Filled 2020-02-17 (×2): qty 3

## 2020-02-17 MED ORDER — FUROSEMIDE 10 MG/ML IJ SOLN
40.0000 mg | Freq: Two times a day (BID) | INTRAMUSCULAR | Status: DC
  Administered 2020-02-17 – 2020-02-18 (×3): 40 mg via INTRAVENOUS
  Filled 2020-02-17 (×3): qty 4

## 2020-02-17 MED ORDER — APIXABAN 2.5 MG PO TABS
2.5000 mg | ORAL_TABLET | Freq: Two times a day (BID) | ORAL | Status: DC
  Administered 2020-02-17 – 2020-02-20 (×6): 2.5 mg via ORAL
  Filled 2020-02-17 (×6): qty 1

## 2020-02-17 MED ORDER — IOHEXOL 350 MG/ML SOLN
60.0000 mL | Freq: Once | INTRAVENOUS | Status: AC | PRN
  Administered 2020-02-17: 16:00:00 60 mL via INTRAVENOUS

## 2020-02-17 MED ORDER — SODIUM CHLORIDE (PF) 0.9 % IJ SOLN: 10.0000 [IU] | Freq: Once | INTRAMUSCULAR | Status: DC

## 2020-02-17 MED ORDER — METOPROLOL SUCCINATE ER 25 MG PO TB24: 25.0000 mg | ORAL_TABLET | Freq: Two times a day (BID) | ORAL | Status: DC

## 2020-02-17 MED ORDER — TRAMADOL HCL 50 MG PO TABS
50.0000 mg | ORAL_TABLET | Freq: Two times a day (BID) | ORAL | Status: DC | PRN
  Administered 2020-02-18 – 2020-02-20 (×4): 50 mg via ORAL
  Filled 2020-02-17 (×4): qty 1

## 2020-02-17 MED ORDER — SODIUM CHLORIDE 0.9 % IV SOLN: INTRAVENOUS | Status: DC

## 2020-02-17 MED ORDER — METOPROLOL SUCCINATE ER 50 MG PO TB24
50.0000 mg | ORAL_TABLET | Freq: Two times a day (BID) | ORAL | Status: DC
  Administered 2020-02-17 – 2020-02-20 (×6): 50 mg via ORAL
  Filled 2020-02-17 (×6): qty 1

## 2020-02-17 MED ORDER — DEXTROSE 50 % IV SOLN
1.0000 | Freq: Once | INTRAVENOUS | Status: AC
  Administered 2020-02-17: 15:00:00 50 mL via INTRAVENOUS
  Filled 2020-02-17: qty 50

## 2020-02-17 NOTE — Progress Notes (Addendum)
Pt with multiple medical problems including stae 2a lung carcinoma on chemo no admitted with afib with rvr and dyspnea. Placed on cardizem drip. Would continue with drip along with po metoprolol. Further changes based on response. Will review echo when available. Has hisory of HFpEF. Continue with eliquis at low dose and follow for evidence of bleeding.

## 2020-02-17 NOTE — H&P (Addendum)
History and Physical    [name] [name] [facility] NUU:[id number] DOB: 03-18-1928 DOA: 02/17/2020  PCP: Ezequiel Kayser, MD   Patient coming from: Home  I have personally briefly reviewed patient's old medical records in [place]  Chief Complaint: Shortness of breath  HPI: [name] [name] is a 86 y.o. female with medical history significant for atrial fibrillation on chronic anticoagulation therapy, history of lung cancer on chemo and radiation therapy.  She was sent to the hospital to be directly admitted from the oncologist office due to worsening shortness of breath from her baseline with exertion and palpitations.  Patient states that she has shortness of breath with exertion related to her COPD but that this has worsened over the last couple of weeks.  She denies having any chest pain and at times she has trouble sleeping due to difficulty breathing.  She has no lower extremity swelling. She has a cough which is nonproductive.  Denies having any nausea, vomiting, abdominal pain or changes in her bowel habits.  She denies having any urinary symptoms. In the oncologist office she was noted to be tachycardic with heart rate in the 120s.  Labs also revealed hyperkalemia.  She had a CT angiogram of the chest which showed bilateral pleural effusions is negative for acute PE. She will be admitted to the hospital for further evaluation  ED Course:   Review of Systems: As per HPI otherwise 10 point review of systems negative.    Past Medical History:  Diagnosis Date  . Breast cancer ([place]) 08/19/2012   LT LUMPECTOMY, radiation tx.   Marland Kitchen COPD (chronic obstructive pulmonary disease) ([place] )   . Hailey-hailey disease   . Hyperlipemia   . Hypertension   . Lung cancer ([place]) 2014   RT LUNG, radiation tx  . Migraine   . Myocardial infarction ([place])   . Osteoporosis   . Personal history of radiation therapy 2013   left breast ca  . Personal history of radiation therapy 2014   lung ca  .  Radiation 2013   FOR BREAST CA  . Radiation 2014   FOR LUNG CA  . Renal artery stenosis [facility])     Past Surgical History:  Procedure Laterality Date  . ABDOMINAL HYSTERECTOMY    . APPENDECTOMY    . BREAST BIOPSY Left 08/03/2012   invasive mammary carcinoma, u/s guided bx  . 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 cardiac stents    . PARATHYROIDECTOMY    . TONSILLECTOMY       reports that she has been smoking. She has a 70.00 pack-year smoking history. She has never used smokeless tobacco. She reports that she does not drink alcohol or use drugs.  Allergies  Allergen Reactions  . Bactrim [Sulfamethoxazole-Trimethoprim] Other (See Comments)    Reduced kidney function.  Per Dr. Holley Raring should not take  . Ibuprofen     Reduced kidney function. Per Dr. Holley Raring should not take  . Macrobid [employer] Macro] Other (See Comments)    Reduced Kidney function.  Should no longer take per Dr. Holley Raring  . Tape Rash    Family History  Problem Relation Age of Onset  . Breast cancer Sister 51     Prior to Admission medications   Medication Sig Start Date End Date Taking? Authorizing Provider  albuterol (PROAIR HFA) 108 (90 Base) MCG/ACT inhaler Inhale 2 puffs into the lungs every 6 (six) hours as needed for wheezing or  shortness of breath.  08/15/15   [provider]  calcium-vitamin D (OSCAL WITH D) 500-200 MG-UNIT TABS tablet Take by mouth.    [provider]  ELIQUIS 2.5 MG TABS tablet Take 2.5 mg by mouth 2 (two) times daily. 01/24/20   [provider]  metoprolol succinate (TOPROL-XL) 25 MG 24 hr tablet Take 25 mg by mouth 2 (two) times daily.  12/08/19   [provider]  ondansetron (ZOFRAN) 8 MG tablet Take 1 tablet (8 mg total) by mouth 2 (two) times daily as needed for refractory nausea / vomiting. 01/06/20   Lloyd Huger, MD  prochlorperazine (COMPAZINE) 10 MG tablet Take 1  tablet (10 mg total) by mouth every 6 (six) hours as needed (Nausea or vomiting). 01/06/20   Lloyd Huger, MD  simvastatin (ZOCOR) 20 MG tablet Take 20 mg by mouth at bedtime. 12/22/19   [provider]  traMADol (ULTRAM) 50 MG tablet Take 1 tablet (50 mg total) by mouth every 12 (twelve) hours as needed. 11/28/19   Sable Feil, PA-C    Physical Exam: Vitals:   02/17/20 1243 02/17/20 1400  BP: (!) 135/99 (!) 147/99  Pulse: (!) 101 (!) 111  Resp: (!) 21 19  SpO2: 95% 98%     Vitals:   02/17/20 1243 02/17/20 1400  BP: (!) 135/99 (!) 147/99  Pulse: (!) 101 (!) 111  Resp: (!) 21 19  SpO2: 95% 98%    Constitutional: NAD, alert and oriented x 3 Eyes: PERRL, lids and conjunctivae normal ENMT: Mucous membranes are moist.  Neck: normal, supple, no masses, no thyromegaly Respiratory: Bilateral air entry, no wheezing,  crackles at the bases. Normal respiratory effort. No accessory muscle use.  Cardiovascular: Irregularly irregular, tachycardic, no murmurs / rubs / gallops. No extremity edema. 2+ pedal pulses. No carotid bruits.  Abdomen: no tenderness, no masses palpated. No hepatosplenomegaly. Bowel sounds positive.  Musculoskeletal: no clubbing / cyanosis. No joint deformity upper and lower extremities.  Skin: no rashes, lesions, ulcers.  Neurologic: No gross focal neurologic deficit. Psychiatric: Normal mood and affect.   Labs on Admission: I have personally reviewed following labs and imaging studies  CBC: Recent Labs  Lab 02/17/20 0828  WBC 4.7  NEUTROABS 3.8  HGB 12.1  HCT 37.9  MCV 93.8  PLT 017   Basic Metabolic Panel: Recent Labs  Lab 02/17/20 0828  NA 139  K 5.7*  CL 104  CO2 27  GLUCOSE 148*  BUN 31*  CREATININE 1.32*  CALCIUM 9.0   GFR: Estimated Creatinine Clearance: 22.8 mL/min (A) (by C-G formula based on SCr of 1.32 mg/dL (H)). Liver Function Tests: Recent Labs  Lab 02/17/20 0828  AST 30  ALT 30  ALKPHOS 93  BILITOT 0.6   PROT 6.9  ALBUMIN 3.5   No results for input(s): LIPASE, AMYLASE in the last 168 hours. No results for input(s): AMMONIA in the last 168 hours. Coagulation Profile: No results for input(s): INR, PROTIME in the last 168 hours. Cardiac Enzymes: No results for input(s): CKTOTAL, CKMB, CKMBINDEX, TROPONINI in the last 168 hours. BNP (last 3 results) No results for input(s): PROBNP in the last 8760 hours. HbA1C: No results for input(s): HGBA1C in the last 72 hours. CBG: No results for input(s): GLUCAP in the last 168 hours. Lipid Profile: No results for input(s): CHOL, HDL, LDLCALC, TRIG, CHOLHDL, LDLDIRECT in the last 72 hours. Thyroid Function Tests: Recent Labs    02/17/20 1350  TSH 2.038  Anemia Panel: No results for input(s): VITAMINB12, FOLATE, FERRITIN, TIBC, IRON, RETICCTPCT in the last 72 hours. Urine analysis:    Component Value Date/Time   COLORURINE YELLOW 01/09/2016 [place] 01/09/2016 1548   APPEARANCEUR Clear 03/05/2015 2050   LABSPEC 1.020 01/09/2016 1548   LABSPEC 1.020 03/05/2015 2050   PHURINE 5.5 01/09/2016 [place] 01/09/2016 1548   GLUCOSEU Negative 03/05/2015 2050   HGBUR NEGATIVE 01/09/2016 [place] 01/09/2016 1548   BILIRUBINUR Negative 03/05/2015 2050   KETONESUR NEGATIVE 01/09/2016 1548   PROTEINUR NEGATIVE 01/09/2016 1548   NITRITE NEGATIVE 01/09/2016 1548   LEUKOCYTESUR TRACE (A) 01/09/2016 1548   LEUKOCYTESUR Trace 03/05/2015 2050    Radiological Exams on Admission: CT ANGIO CHEST PE W OR WO CONTRAST  Result Date: 02/17/2020 CLINICAL DATA:  Shortness of breath with exertion. Lung cancer. Clinical concern for pulmonary embolism. EXAM: CT ANGIOGRAPHY CHEST WITH CONTRAST TECHNIQUE: Multidetector CT imaging of the chest was performed using the standard protocol during bolus administration of intravenous contrast. Multiplanar CT image reconstructions and MIPs were obtained to evaluate the  vascular anatomy. CONTRAST:  16mL OMNIPAQUE IOHEXOL 350 MG/ML SOLN COMPARISON:  PET-CT 11/09/2019. Chest CTA 11/01/2019. FINDINGS: Cardiovascular: The pulmonary arteries are well opacified with contrast to the level of the subsegmental branches. There is no evidence of acute pulmonary embolism. There is extensive severe atherosclerosis of the aorta, great vessels and coronary arteries. There is limited opacification of the systemic arteries. Aneurysmal dilatation and irregular mural thrombus throughout the descending thoracic aorta are grossly stable. The aneurysm has a maximal diameter of 4.9 cm on sagittal image 64/8, similar to previous study. No apparent acute arterial abnormalities. The heart size is normal. There is no pericardial effusion. Mediastinum/Nodes: There are no enlarged mediastinal, hilar or axillary lymph nodes. The thyroid gland, trachea and esophagus demonstrate no significant findings. Lungs/Pleura: There are new moderate right and small left dependent pleural effusions with associated increased compressive atelectasis at both lung bases. The medial left lower lobe mass adjacent to the descending aorta appears slightly smaller, measuring 3.7 x 2.4 cm on image 59/6. Previously 4.6 x 3.5 cm, and hypermetabolic on PET-CT. Stable bandlike fibrosis in the right upper lobe, likely related to previous therapy. Underlying moderate centrilobular emphysema. Upper abdomen: The visualized upper abdomen appears stable. There is stable low-density enlargement the left adrenal gland with central calcifications. There is diffuse aortic and branch vessel atherosclerosis. Musculoskeletal/Chest wall: There is no chest wall mass or suspicious osseous finding. Stable old right-sided rib fractures and chronic thoracic compression deformities. Review of the MIP images confirms the above findings. IMPRESSION: 1. No evidence of acute pulmonary embolism or other acute vascular findings. 2. New moderate right and small  left dependent pleural effusions with associated increased compressive atelectasis at both lung bases. 3. The medial left lower lobe mass appears slightly smaller, and was hypermetabolic on previous PET-CT, presumably treated lung cancer. No evidence of metastatic disease. 4. Grossly stable descending thoracic aortic aneurysm measuring up to 4.9 cm in diameter. Consider continued annual CTA follow-up. 5. Aortic Atherosclerosis (ICD10-I70.0) and Emphysema (ICD10-J43.9). Electronically Signed   By: Richardean Sale M.D.   On: 02/17/2020 16:58    EKG: Independently reviewed.  Atrial fibrillation with a rapid ventricular rate  Assessment/Plan Principal Problem:   Acute CHF (congestive heart failure) (HCC) Active Problems:   Benign essential hypertension   Hyperkalemia   Primary cancer of right upper lobe of lung (HCC)   Rapid atrial  fibrillation ([place])   Emphysema lung ([place])    Acute CHF Patient presents for worsening shortness of breath exertion associated with orthopnea. Imaging shows bilateral pleural effusions Acute CHF may be related to rapid atrial fibrillation Optimize rate control Start patient on Lasix 40 mg IV every 12 Obtain 2D echocardiogram to assess LVEF Continue metoprolol   Atrial fibrillation with rapid ventricular rate Patient has a known history of A. fib and presents in a rapid ventricular rate Will start patient on Cardizem drip and titrate for heart rate less than 100bpm Obtain TSH levels Trend troponin level   History of lung cancer Follow-up with oncology as an outpatient  Emphysema Continue as needed bronchodilator therapy and inhaled steroids  Hypertension Continue metoprolol   Hyperkalemia Treat with dextrose, insulin and bronchodilators Repeat potassium levels  DVT prophylaxis: Apixaban Code Status: DO NOT RESUSCITATE Family Communication: Plan of care was discussed with patient at the bedside.  She verbalizes understanding and agrees with the  plan Disposition Plan: Back to previous home environment Consults called: Cardiology    Cael Worth MD [facility]     02/17/2020, 5:52 PM

## 2020-02-17 NOTE — Progress Notes (Signed)
Pt in to clinic today for follow up lung cancer. Pt reports being SOB with any exertion or activity, granddaughter reports HR getting as high as 140s at times with activity. EKG completed per MD order, pt in a fib with RVR with rate 110s to 120s at rest. Per MD, pt will be direct admit to telemetry floor due to a fib with RVR and hyperkalemia. Pt in recliner, declines pillow, blanket or drink, call bell within reach, granddaughter in room. Will continue to monitor.

## 2020-02-18 LAB — CBC
HCT: 33.2 % — ABNORMAL LOW (ref 36.0–46.0)
Hemoglobin: 10.4 g/dL — ABNORMAL LOW (ref 12.0–15.0)
MCH: 29.7 pg (ref 26.0–34.0)
MCHC: 31.3 g/dL (ref 30.0–36.0)
MCV: 94.9 fL (ref 80.0–100.0)
Platelets: 168 10*3/uL (ref 150–400)
RBC: 3.5 MIL/uL — ABNORMAL LOW (ref 3.87–5.11)
RDW: 18.3 % — ABNORMAL HIGH (ref 11.5–15.5)
WBC: 4.6 10*3/uL (ref 4.0–10.5)
nRBC: 0 % (ref 0.0–0.2)

## 2020-02-18 LAB — BASIC METABOLIC PANEL
Anion gap: 11 (ref 5–15)
BUN: 32 mg/dL — ABNORMAL HIGH (ref 8–23)
CO2: 25 mmol/L (ref 22–32)
Calcium: 8.1 mg/dL — ABNORMAL LOW (ref 8.9–10.3)
Chloride: 103 mmol/L (ref 98–111)
Creatinine, Ser: 1.16 mg/dL — ABNORMAL HIGH (ref 0.44–1.00)
GFR calc Af Amer: 48 mL/min — ABNORMAL LOW (ref >60)
GFR calc non Af Amer: 41 mL/min — ABNORMAL LOW (ref >60)
Glucose, Bld: 89 mg/dL (ref 70–99)
Potassium: 3.9 mmol/L (ref 3.5–5.1)
Sodium: 139 mmol/L (ref 135–145)

## 2020-02-18 LAB — SARS CORONAVIRUS 2 (TAT 6-24 HRS): SARS Coronavirus 2: NEGATIVE

## 2020-02-18 LAB — BRAIN NATRIURETIC PEPTIDE: B Natriuretic Peptide: 588 pg/mL — ABNORMAL HIGH (ref 0.0–100.0)

## 2020-02-18 MED ORDER — SODIUM CHLORIDE 0.9% FLUSH
3.0000 mL | Freq: Two times a day (BID) | INTRAVENOUS | Status: DC
  Administered 2020-02-18 – 2020-02-20 (×6): 3 mL via INTRAVENOUS

## 2020-02-18 MED ORDER — ATORVASTATIN CALCIUM 10 MG PO TABS
10.0000 mg | ORAL_TABLET | Freq: Every day | ORAL | Status: DC
  Administered 2020-02-18 – 2020-02-19 (×2): 10 mg via ORAL
  Filled 2020-02-18 (×2): qty 1

## 2020-02-18 MED ORDER — DILTIAZEM HCL 30 MG PO TABS
30.0000 mg | ORAL_TABLET | Freq: Four times a day (QID) | ORAL | Status: DC
  Administered 2020-02-18 – 2020-02-19 (×4): 30 mg via ORAL
  Filled 2020-02-18 (×4): qty 1

## 2020-02-18 NOTE — Progress Notes (Signed)
Cardizem drip being tapered off due to HR becoming more controlled, 60-75. BP as low as 93/82 once. Patient did receive Toprol XL 50 mg at bedtime. Patient is tacypneic at rest with RR as high as 32. Denies SOB, lungs with few crackles at base. On room air. Placed on continuous O2 sat which reveals saturations 91-95%. Will CTM.

## 2020-02-18 NOTE — Progress Notes (Signed)
PHARMACIST - PHYSICIAN ORDER COMMUNICATION  CONCERNING: Diltiazem and Simvastatin  and risk of rhabdomyolysis  DESCRIPTION:  Patients on diltiazem and simvastatin >10 mg/day have reported cases of rhabdomyolysis. Pharmacy is to assess simvastatin dose. If >10 mg, substitute atorvastatin (Lipitor) 1mg  for each 2mg  simvastatin.  This patient is ordered simvastatin 20 mg and diltiazem 30 mg every 6 hours    ACTION TAKEN: Per protocol pharmacy has discontinued the patient's order for simvastatin and replaced it with Atorvastatin 10 mg.    Dallie Piles, PharmD Clinical Pharmacist 02/18/2020 10:38 AM

## 2020-02-18 NOTE — Progress Notes (Signed)
[facility]  ROUNDING NOTE   Subjective:   [name] [facility] admitted to [facility] on 02/17/2020 for Rapid atrial fibrillation ([place]) [I48.91]  Daughter at bedside. Patient was found to have bilateral pleural effusions.   Creatinine on admission of 1.34 and now improved to 1.16  CTA of chest yesterday.   Objective:  Vital signs in last 24 hours:  Temp:  [97.9 F (36.6 C)-98.6 F (37 C)] 98.6 F (37 C) (04/03 1228) Pulse Rate:  [58-114] 96 (04/03 1228) Resp:  [16-31] 16 (04/03 1228) BP: (93-144)/(60-110) 140/94 (04/03 1228) SpO2:  [92 %-97 %] 92 % (04/03 1228) Weight:  [49.7 kg] 49.7 kg (04/02 2310)  Weight change:  Filed Weights   02/17/20 2310  Weight: 49.7 kg    Intake/Output: I/O last 3 completed shifts: In: -  Out: 1250 [Urine:1250]   Intake/Output this shift:  Total I/O In: 240 [P.O.:240] Out: 300 [Urine:300]  Physical Exam: General: NAD, laying in bed  Head: Normocephalic, atraumatic. Moist oral mucosal membranes  Eyes: Anicteric, PERRL  Neck: Supple, trachea midline  Lungs:  Clear to auscultation  Heart: Regular rate and rhythm  Abdomen:  Soft, nontender,   Extremities:  no peripheral edema.  Neurologic: Nonfocal, moving all four extremities  Skin: No lesions        Basic Metabolic Panel: Recent Labs  Lab 02/17/20 0828 02/17/20 1921 02/18/20 0405  NA 139 140 139  K 5.7* 4.8 3.9  CL 104 104 103  CO2 27 27 25   GLUCOSE 148* 102* 89  BUN 31* 32* 32*  CREATININE 1.32* 1.34* 1.16*  CALCIUM 9.0 8.3* 8.1*    Liver Function Tests: Recent Labs  Lab 02/17/20 0828  AST 30  ALT 30  ALKPHOS 93  BILITOT 0.6  PROT 6.9  ALBUMIN 3.5   No results for input(s): LIPASE, AMYLASE in the last 168 hours. No results for input(s): AMMONIA in the last 168 hours.  CBC: Recent Labs  Lab 02/17/20 0828 02/18/20 0405  WBC 4.7 4.6  NEUTROABS 3.8  --   HGB 12.1 10.4*  HCT 37.9 33.2*  MCV 93.8 94.9  PLT 178 168    Cardiac  Enzymes: No results for input(s): CKTOTAL, CKMB, CKMBINDEX, TROPONINI in the last 168 hours.  BNP: Invalid input(s): POCBNP  CBG: Recent Labs  Lab 02/17/20 1807  GLUCAP 69    Microbiology: Results for orders placed or performed during the hospital encounter of 02/17/20  SARS CORONAVIRUS 2 (TAT 6-24 HRS) Nasopharyngeal Nasopharyngeal Swab     Status: None   Collection Time: 02/17/20  3:00 PM   Specimen: Nasopharyngeal Swab  Result Value Ref Range Status   SARS Coronavirus 2 NEGATIVE NEGATIVE Final    Comment: (NOTE) SARS-CoV-2 target nucleic acids are NOT DETECTED. The SARS-CoV-2 RNA is generally detectable in upper and lower respiratory specimens during the acute phase of infection. Negative results do not preclude SARS-CoV-2 infection, do not rule out co-infections with other pathogens, and should not be used as the sole basis for treatment or other patient management decisions. Negative results must be combined with clinical observations, patient history, and epidemiological information. The expected result is Negative. Fact Sheet for Patients: SugarRoll.be Fact Sheet for Healthcare Providers: https://www.woods-mathews.com/ This test is not yet approved or cleared by the [place] FDA and  has been authorized for detection and/or diagnosis of SARS-CoV-2 by FDA under an Emergency Use Authorization (EUA). This EUA will remain  in effect (meaning this test can be used) for the duration  of the COVID-19 declaration under Section 56 4(b)(1) of the Act, 21 U.S.C. section 360bbb-3(b)(1), unless the authorization is terminated or revoked sooner. Performed at [facility], [place] [address] [place], [place] [address]     Coagulation Studies: No results for input(s): LABPROT, INR in the last 72 hours.  Urinalysis: No results for input(s): COLORURINE, LABSPEC, PHURINE, GLUCOSEU, HGBUR, BILIRUBINUR, KETONESUR, PROTEINUR,  UROBILINOGEN, NITRITE, LEUKOCYTESUR in the last 72 hours.  Invalid input(s): APPERANCEUR    Imaging: CT ANGIO CHEST PE W OR WO CONTRAST  Result Date: 02/17/2020 CLINICAL DATA:  Shortness of breath with exertion. Lung cancer. Clinical concern for pulmonary embolism. EXAM: CT ANGIOGRAPHY CHEST WITH CONTRAST TECHNIQUE: Multidetector CT imaging of the chest was performed using the standard protocol during bolus administration of intravenous contrast. Multiplanar CT image reconstructions and MIPs were obtained to evaluate the vascular anatomy. CONTRAST:  82mL OMNIPAQUE IOHEXOL 350 MG/ML SOLN COMPARISON:  PET-CT 11/09/2019. Chest CTA 11/01/2019. FINDINGS: Cardiovascular: The pulmonary arteries are well opacified with contrast to the level of the subsegmental branches. There is no evidence of acute pulmonary embolism. There is extensive severe atherosclerosis of the aorta, great vessels and coronary arteries. There is limited opacification of the systemic arteries. Aneurysmal dilatation and irregular mural thrombus throughout the descending thoracic aorta are grossly stable. The aneurysm has a maximal diameter of 4.9 cm on sagittal image 64/8, similar to previous study. No apparent acute arterial abnormalities. The heart size is normal. There is no pericardial effusion. Mediastinum/Nodes: There are no enlarged mediastinal, hilar or axillary lymph nodes. The thyroid gland, trachea and esophagus demonstrate no significant findings. Lungs/Pleura: There are new moderate right and small left dependent pleural effusions with associated increased compressive atelectasis at both lung bases. The medial left lower lobe mass adjacent to the descending aorta appears slightly smaller, measuring 3.7 x 2.4 cm on image 59/6. Previously 4.6 x 3.5 cm, and hypermetabolic on PET-CT. Stable bandlike fibrosis in the right upper lobe, likely related to previous therapy. Underlying moderate centrilobular emphysema. Upper abdomen: The  visualized upper abdomen appears stable. There is stable low-density enlargement the left adrenal gland with central calcifications. There is diffuse aortic and branch vessel atherosclerosis. Musculoskeletal/Chest wall: There is no chest wall mass or suspicious osseous finding. Stable old right-sided rib fractures and chronic thoracic compression deformities. Review of the MIP images confirms the above findings. IMPRESSION: 1. No evidence of acute pulmonary embolism or other acute vascular findings. 2. New moderate right and small left dependent pleural effusions with associated increased compressive atelectasis at both lung bases. 3. The medial left lower lobe mass appears slightly smaller, and was hypermetabolic on previous PET-CT, presumably treated lung cancer. No evidence of metastatic disease. 4. Grossly stable descending thoracic aortic aneurysm measuring up to 4.9 cm in diameter. Consider continued annual CTA follow-up. 5. Aortic Atherosclerosis (ICD10-I70.0) and Emphysema (ICD10-J43.9). Electronically Signed   By: Richardean Sale M.D.   On: 02/17/2020 16:58     Medications:    . apixaban  2.5 mg Oral BID  . atorvastatin  10 mg Oral q1800  . calcium-vitamin D  1 tablet Oral Daily  . diltiazem  30 mg Oral Q6H  . furosemide  40 mg Intravenous BID  . metoprolol succinate  50 mg Oral BID  . sodium chloride flush  3 mL Intravenous Q12H   acetaminophen **OR** acetaminophen, guaiFENesin, ipratropium-albuterol, ondansetron **OR** ondansetron (ZOFRAN) IV, traMADol  Assessment/ Plan:  [name] is a 86 y.o. white female with history of  lung cancer, coronary artery disease, hypertension, primary hyperparathyroidism, breast cancer, hyperlpidemia, peripheral vascular disease who is admitted to [facility] on 02/17/2020 for Rapid atrial fibrillation ([place]) [I48.91]  1. Acute renal failure on chronic kidney disease stage IIIB with proteinuria: baseline creatinine of 1.25, GFR of 38 on  02/10/20 Chronic kidney disease secondary to renal artery stenosis.  Acute renal failure secondary to poor perfusion from atrial fibrillation.   2. Hypertension: recently taken off lisinopril due to her chronic cough.  140/94.  With atrial fibrillation with rapid ventricular rate.  - agree with furosemide.  - Continue diltiazem and metoprolol for rate control.    LOS: 1 Kahlil Cowans 4/3/20214:02 PM

## 2020-02-18 NOTE — Progress Notes (Addendum)
[facility] Progress Note  Patient: [name] [name] [facility]    HEN:[id number]  DOA: 02/17/2020     Date of Service: the patient was seen and examined on 02/18/2020  No chief complaint on file.  Brief hospital course: [name] [name] is a 86 y.o. female with medical history significant for atrial fibrillation on chronic anticoagulation therapy, history of lung cancer on chemo and radiation therapy.  She was sent to the hospital to be directly admitted from the oncologist office due to worsening shortness of breath from her baseline with exertion and palpitations.  Patient states that she has shortness of breath with exertion related to her COPD but that this has worsened over the last couple of weeks.  She denies having any chest pain and at times she has trouble sleeping due to difficulty breathing.  She has no lower extremity swelling. She has a cough which is nonproductive.  Denies having any nausea, vomiting, abdominal pain or changes in her bowel habits.  She denies having any urinary symptoms. In the oncologist office she was noted to be tachycardic with heart rate in the 120s.  Labs also revealed hyperkalemia.  She had a CT angiogram of the chest which showed bilateral pleural effusions is negative for acute PE. She will be admitted to the hospital for further evaluation  Currently further plan is continue oral Cardizem and monitor heart rate, plan to discharge tomorrow if heart rate remains stable.  Assessment and Plan:  Principal Problem:   Acute CHF (congestive heart failure) (HCC) Active Problems:   Benign essential hypertension   Hyperkalemia   Primary cancer of right upper lobe of lung (HCC)   Rapid atrial fibrillation (HCC)   Emphysema lung (HCC)     Atrial fibrillation with rapid ventricular rate Patient has a known history of A. fib and presents in a rapid ventricular rate S/p Cardizem drip, weaned off on 4/3, started Cardizem 30 mg p.o. every 6 hourly TSH  levels 2.0 normal Increased Toprol-XL 50 mg p.o. twice daily Continue Eliquis 2.5 mg p.o. twice daily for VTE prophylaxis Cardiology consult appreciated, recommended to discharge on Cardizem 120 mg p.o. daily and metoprolol succinate 50 twice daily with Eliquis 2.5 and follow-up as an outpatient in 1 week    Shortness of breath, multifactorial, could be secondary to A. fib with RVR VS HFpEF, and  bilateral pleural effusion Patient presents for worsening shortness of breath exertion associated with orthopnea. Imaging shows bilateral pleural effusions SLB improved after optimization of heart rate  Continue Lasix 40 mg IV every 12h TTE 2 weeks ago done by cardiologist, LVEF 55%, moderate MR, moderate TR     History of lung cancer Follow-up with oncology as an outpatient Bilateral pleural effusion R > L Patient does not seem to be in respiratory distress due to pleural effusion, recommended repeat chest x-ray in few weeks as an outpatient, if persistent pleural effusion or increased then patient can get thoracentesis done as an outpatient  Emphysema Continue as needed bronchodilator therapy and inhaled steroids  Hypertension Continue metoprolol   Hyperkalemia, resolved Treat with dextrose, insulin and bronchodilators Repeat potassium levels 3.9  CKD stage III Creatinine stable Monitor renal output and renal functions Family requested nephrology consult  Ascending thoracic aneurysm 4.9 cm, follow with PCP and vascular surgery as an outpatient   Body mass index is 19.4 kg/m.  Interventions:  Diet: Heart healthy DVT Prophylaxis: Therapeutic Anticoagulation with Eliquis   Advance goals of care discussion: DNR  Family Communication: family  was present at bedside, at the time of interview.  The pt provided permission to discuss medical plan with the family. Opportunity was given to ask question and all questions were answered satisfactorily.   Disposition:  Pt is from  Home, admitted with palpitations and shortness of breath.  Patient was on Cardizem IV infusion, which has been switched to oral Cardizem.  Plan is to monitor heart rate today as per cardiologist and possible discharge to home tomorrow if heart rate remains stable.   Subjective: Patient was seen and examined at bedside, no overnight issues. Patient feels improvement in the shortness of breath, palpitations has been resolved.  Patient has no shortness of breath at rest but feels dyspnea on exertion.  Denies any chest pain, no abdominal pain, no nausea vomiting or diarrhea.  Physical Exam: General:  alert oriented to time, place, and person.  Appear in mild distress, affect appropriate Eyes: PERRLA ENT: Oral Mucosa Clear, moist  Neck: no JVD,  Cardiovascular: S1 and S2 Present, no Murmur, irregular rhythm Respiratory: good respiratory effort, Bilateral Air entry equal and Decreased, bibasilar crackles, no wheezes Abdomen: Bowel Sound present, Soft and no tenderness,  Skin: no rashes Extremities: no Pedal edema, no calf tenderness Neurologic: without any new focal findings Gait not checked due to patient safety concerns  Vitals:   02/18/20 0600 02/18/20 0720 02/18/20 0730 02/18/20 1228  BP: 121/80 (!) 124/110  (!) 140/94  Pulse: 76 91  96  Resp: 20 18 (!) 25 16  Temp:  98.6 F (37 C)  98.6 F (37 C)  TempSrc:  Oral  Oral  SpO2:  97%  92%  Weight:      Height:        Intake/Output Summary (Last 24 hours) at 02/18/2020 1427 Last data filed at 02/18/2020 0853 Gross per 24 hour  Intake 240 ml  Output 1550 ml  Net -1310 ml   Filed Weights   02/17/20 2310  Weight: 49.7 kg    Data Reviewed: I have personally reviewed and interpreted daily labs, tele strips, imagings as discussed above. I reviewed all nursing notes, pharmacy notes, vitals, pertinent old records I have discussed plan of care as described above with RN and patient/family.  CBC: Recent Labs  Lab 02/17/20 0828  02/18/20 0405  WBC 4.7 4.6  NEUTROABS 3.8  --   HGB 12.1 10.4*  HCT 37.9 33.2*  MCV 93.8 94.9  PLT 178 585   Basic Metabolic Panel: Recent Labs  Lab 02/17/20 0828 02/17/20 1921 02/18/20 0405  NA 139 140 139  K 5.7* 4.8 3.9  CL 104 104 103  CO2 27 27 25   GLUCOSE 148* 102* 89  BUN 31* 32* 32*  CREATININE 1.32* 1.34* 1.16*  CALCIUM 9.0 8.3* 8.1*    Studies: CT ANGIO CHEST PE W OR WO CONTRAST  Result Date: 02/17/2020 CLINICAL DATA:  Shortness of breath with exertion. Lung cancer. Clinical concern for pulmonary embolism. EXAM: CT ANGIOGRAPHY CHEST WITH CONTRAST TECHNIQUE: Multidetector CT imaging of the chest was performed using the standard protocol during bolus administration of intravenous contrast. Multiplanar CT image reconstructions and MIPs were obtained to evaluate the vascular anatomy. CONTRAST:  86mL OMNIPAQUE IOHEXOL 350 MG/ML SOLN COMPARISON:  PET-CT 11/09/2019. Chest CTA 11/01/2019. FINDINGS: Cardiovascular: The pulmonary arteries are well opacified with contrast to the level of the subsegmental branches. There is no evidence of acute pulmonary embolism. There is extensive severe atherosclerosis of the aorta, great vessels and coronary arteries. There is limited opacification of  the systemic arteries. Aneurysmal dilatation and irregular mural thrombus throughout the descending thoracic aorta are grossly stable. The aneurysm has a maximal diameter of 4.9 cm on sagittal image 64/8, similar to previous study. No apparent acute arterial abnormalities. The heart size is normal. There is no pericardial effusion. Mediastinum/Nodes: There are no enlarged mediastinal, hilar or axillary lymph nodes. The thyroid gland, trachea and esophagus demonstrate no significant findings. Lungs/Pleura: There are new moderate right and small left dependent pleural effusions with associated increased compressive atelectasis at both lung bases. The medial left lower lobe mass adjacent to the descending  aorta appears slightly smaller, measuring 3.7 x 2.4 cm on image 59/6. Previously 4.6 x 3.5 cm, and hypermetabolic on PET-CT. Stable bandlike fibrosis in the right upper lobe, likely related to previous therapy. Underlying moderate centrilobular emphysema. Upper abdomen: The visualized upper abdomen appears stable. There is stable low-density enlargement the left adrenal gland with central calcifications. There is diffuse aortic and branch vessel atherosclerosis. Musculoskeletal/Chest wall: There is no chest wall mass or suspicious osseous finding. Stable old right-sided rib fractures and chronic thoracic compression deformities. Review of the MIP images confirms the above findings. IMPRESSION: 1. No evidence of acute pulmonary embolism or other acute vascular findings. 2. New moderate right and small left dependent pleural effusions with associated increased compressive atelectasis at both lung bases. 3. The medial left lower lobe mass appears slightly smaller, and was hypermetabolic on previous PET-CT, presumably treated lung cancer. No evidence of metastatic disease. 4. Grossly stable descending thoracic aortic aneurysm measuring up to 4.9 cm in diameter. Consider continued annual CTA follow-up. 5. Aortic Atherosclerosis (ICD10-I70.0) and Emphysema (ICD10-J43.9). Electronically Signed   By: Richardean Sale M.D.   On: 02/17/2020 16:58    Scheduled Meds: . apixaban  2.5 mg Oral BID  . atorvastatin  10 mg Oral q1800  . calcium-vitamin D  1 tablet Oral Daily  . diltiazem  30 mg Oral Q6H  . furosemide  40 mg Intravenous BID  . metoprolol succinate  50 mg Oral BID  . sodium chloride flush  3 mL Intravenous Q12H   Continuous Infusions: PRN Meds: acetaminophen **OR** acetaminophen, guaiFENesin, ipratropium-albuterol, ondansetron **OR** ondansetron (ZOFRAN) IV, traMADol  Time spent: 35 minutes  Author: Val Riles. MD [facility] 02/18/2020 2:27 PM  To reach On-call, see care teams to locate the  attending and reach out to them via [email or web address]. If 7PM-7AM, please contact night-coverage If you still have difficulty reaching the attending provider, please page the [facility] (Director on Call) for [facility] on amion for assistance.

## 2020-02-18 NOTE — Consult Note (Addendum)
Cardiology Consultation Note    Patient ID: [name], MRN: [id number], DOB/AGE: 84-Feb-1929 84 y.o. Admit date: 02/17/2020   Date of Consult: 02/18/2020 Primary Physician: Ezequiel Kayser, MD Primary Cardiologist: Dr. Nehemiah Massed  Chief Complaint: sob/rapid heart rate Reason for Consultation: afib/chf Requesting MD: Dr. Dwyane Dee  HPI: [name] [name] is a 86 y.o. female with history of stage II left lower lobe lung malignancy, atrial fibrillation who was admitted after being noted to have increased shortness of breath, peripheral edema with rapid heart rate.  She did have oncology clinic on day of admission for consideration of cycle 4 of her weekly chemotherapy which included Taxol and carboplatinum.  She had increased weakness fatigue and shortness of breath as well as dyspnea on exertion.  She has poor p.o. intake.  EKG showed atrial fibrillation with increased ventricular response.  She was scheduled for admission for further treatment.  BNP was 713 with no recent value for comparison for baseline.  She had stable chronic renal insufficiency with a GFR of 35.  She was Covid negative.  Chest CT revealed no pulmonary embolus or acute vascular findings.  There was a new moderate right and small left dependent pleural effusion associated with compressive atelectasis at the lung bases.  Left lower lobe mass wonders if anything smaller.  She had a stable 4.9 cm ascending thoracic aortic aneurysm.  She was placed on a Cardizem drip and continued with apixaban at 2.5 mg twice daily for her decreased renal function and low body weight as well as age.  She was treated with IV furosemide.  Thus far she has diuresed 1.25 L.  Systolic blood pressure 474.  Heart rates remained in the low to mid 100 teens. Her rate was improved with IV Cardizem. This was discontinued approximately 3 AM this morning. Her heart rate is gradually increased back up. She is hemodynamically stable. Will likely need p.o.  Cardizem along with p.o. metoprolol for rate control.  Past Medical History:  Diagnosis Date  . Breast cancer ([place]) 08/19/2012   LT LUMPECTOMY, radiation tx.   Marland Kitchen COPD (chronic obstructive pulmonary disease) ([place])   . Hailey-hailey disease   . Hyperlipemia   . Hypertension   . Lung cancer ([place]) 2014   RT LUNG, radiation tx  . Migraine   . Myocardial infarction ([place])   . Osteoporosis   . Personal history of radiation therapy 2013   left breast ca  . Personal history of radiation therapy 2014   lung ca  . Radiation 2013   FOR BREAST CA  . Radiation 2014   FOR LUNG CA  . Renal artery stenosis [facility])       Surgical History:  Past Surgical History:  Procedure Laterality Date  . ABDOMINAL HYSTERECTOMY    . APPENDECTOMY    . BREAST BIOPSY Left 08/03/2012   invasive mammary carcinoma, u/s guided bx  . 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 cardiac stents    . PARATHYROIDECTOMY    . TONSILLECTOMY       Home Meds: Prior to Admission medications   Medication Sig Start Date End Date Taking? Authorizing Provider  albuterol (PROAIR HFA) 108 (90 Base) MCG/ACT inhaler Inhale 2 puffs into the lungs every 6 (six) hours as needed for wheezing or shortness of breath.  08/15/15   [provider]  calcium-vitamin D (OSCAL WITH D) 500-200 MG-UNIT TABS tablet Take by mouth.    [provider]  ELIQUIS 2.5 MG TABS tablet Take 2.5 mg by mouth 2 (two) times daily. 01/24/20   [provider]  metoprolol succinate (TOPROL-XL) 25 MG 24 hr tablet Take 25 mg by mouth 2 (two) times daily.  12/08/19   [provider]  ondansetron (ZOFRAN) 8 MG tablet Take 1 tablet (8 mg total) by mouth 2 (two) times daily as needed for refractory nausea / vomiting. 01/06/20   Lloyd Huger, MD  prochlorperazine (COMPAZINE) 10 MG tablet Take 1 tablet (10 mg total) by mouth every 6 (six) hours as needed (Nausea or vomiting).  01/06/20   Lloyd Huger, MD  simvastatin (ZOCOR) 20 MG tablet Take 20 mg by mouth at bedtime. 12/22/19   [provider]  traMADol (ULTRAM) 50 MG tablet Take 1 tablet (50 mg total) by mouth every 12 (twelve) hours as needed. 11/28/19   Sable Feil, PA-C    Inpatient Medications:  . apixaban  2.5 mg Oral BID  . calcium-vitamin D  1 tablet Oral Daily  . furosemide  40 mg Intravenous BID  . metoprolol succinate  50 mg Oral BID  . simvastatin  20 mg Oral QHS  . sodium chloride flush  3 mL Intravenous Q12H   . diltiazem (CARDIZEM) infusion Stopped (02/18/20 0302)    Allergies:  Allergies  Allergen Reactions  . Bactrim [Sulfamethoxazole-Trimethoprim] Other (See Comments)    Reduced kidney function.  Per Dr. Holley Raring should not take  . Ibuprofen     Reduced kidney function. Per Dr. Holley Raring should not take  . Macrobid [employer] Macro] Other (See Comments)    Reduced Kidney function.  Should no longer take per Dr. Holley Raring  . Tape Rash    Social History   Socioeconomic History  . Marital status: Widowed    Spouse name: Not on file  . Number of children: Not on file  . Years of education: Not on file  . Highest education level: Not on file  Occupational History  . Not on file  Tobacco Use  . Smoking status: Current Every Day Smoker    Packs/day: 1.00    Years: 70.00    Pack years: 70.00  . Smokeless tobacco: Never Used  Substance and Sexual Activity  . Alcohol use: No  . Drug use: No  . Sexual activity: Never  Other Topics Concern  . Not on file  Social History Narrative  . Not on file   Social Determinants of Health   Financial Resource Strain:   . Difficulty of Paying Living Expenses:   Food Insecurity:   . Worried About Charity fundraiser in the Last Year:   . Arboriculturist in the Last Year:   Transportation Needs:   . Film/video editor (Medical):   Marland Kitchen Lack of Transportation (Non-Medical):   Physical Activity:   . Days of  Exercise per Week:   . Minutes of Exercise per Session:   Stress:   . Feeling of Stress :   Social Connections:   . Frequency of Communication with Friends and Family:   . Frequency of Social Gatherings with Friends and Family:   . Attends Religious Services:   . Active Member of Clubs or Organizations:   . Attends Archivist Meetings:   Marland Kitchen Marital Status:   Intimate Partner Violence:   . Fear of Current or Ex-Partner:   . Emotionally Abused:   Marland Kitchen Physically Abused:   . Sexually Abused:  Family History  Problem Relation Age of Onset  . Breast cancer Sister 55     Review of Systems: A 12-system review of systems was performed and is negative except as noted in the HPI.  Labs: No results for input(s): CKTOTAL, CKMB, TROPONINI in the last 72 hours. Lab Results  Component Value Date   WBC 4.6 02/18/2020   HGB 10.4 (L) 02/18/2020   HCT 33.2 (L) 02/18/2020   MCV 94.9 02/18/2020   PLT 168 02/18/2020    Recent Labs  Lab 02/17/20 0828 02/17/20 1921 02/18/20 0405  NA 139   < > 139  K 5.7*   < > 3.9  CL 104   < > 103  CO2 27   < > 25  BUN 31*   < > 32*  CREATININE 1.32*   < > 1.16*  CALCIUM 9.0   < > 8.1*  PROT 6.9  --   --   BILITOT 0.6  --   --   ALKPHOS 93  --   --   ALT 30  --   --   AST 30  --   --   GLUCOSE 148*   < > 89   < > = values in this interval not displayed.   No results found for: CHOL, HDL, LDLCALC, TRIG No results found for: DDIMER  Radiology/Studies:  CT ANGIO CHEST PE W OR WO CONTRAST  Result Date: 02/17/2020 CLINICAL DATA:  Shortness of breath with exertion. Lung cancer. Clinical concern for pulmonary embolism. EXAM: CT ANGIOGRAPHY CHEST WITH CONTRAST TECHNIQUE: Multidetector CT imaging of the chest was performed using the standard protocol during bolus administration of intravenous contrast. Multiplanar CT image reconstructions and MIPs were obtained to evaluate the vascular anatomy. CONTRAST:  55mL OMNIPAQUE IOHEXOL 350 MG/ML SOLN  COMPARISON:  PET-CT 11/09/2019. Chest CTA 11/01/2019. FINDINGS: Cardiovascular: The pulmonary arteries are well opacified with contrast to the level of the subsegmental branches. There is no evidence of acute pulmonary embolism. There is extensive severe atherosclerosis of the aorta, great vessels and coronary arteries. There is limited opacification of the systemic arteries. Aneurysmal dilatation and irregular mural thrombus throughout the descending thoracic aorta are grossly stable. The aneurysm has a maximal diameter of 4.9 cm on sagittal image 64/8, similar to previous study. No apparent acute arterial abnormalities. The heart size is normal. There is no pericardial effusion. Mediastinum/Nodes: There are no enlarged mediastinal, hilar or axillary lymph nodes. The thyroid gland, trachea and esophagus demonstrate no significant findings. Lungs/Pleura: There are new moderate right and small left dependent pleural effusions with associated increased compressive atelectasis at both lung bases. The medial left lower lobe mass adjacent to the descending aorta appears slightly smaller, measuring 3.7 x 2.4 cm on image 59/6. Previously 4.6 x 3.5 cm, and hypermetabolic on PET-CT. Stable bandlike fibrosis in the right upper lobe, likely related to previous therapy. Underlying moderate centrilobular emphysema. Upper abdomen: The visualized upper abdomen appears stable. There is stable low-density enlargement the left adrenal gland with central calcifications. There is diffuse aortic and branch vessel atherosclerosis. Musculoskeletal/Chest wall: There is no chest wall mass or suspicious osseous finding. Stable old right-sided rib fractures and chronic thoracic compression deformities. Review of the MIP images confirms the above findings. IMPRESSION: 1. No evidence of acute pulmonary embolism or other acute vascular findings. 2. New moderate right and small left dependent pleural effusions with associated increased  compressive atelectasis at both lung bases. 3. The medial left lower lobe mass appears slightly smaller,  and was hypermetabolic on previous PET-CT, presumably treated lung cancer. No evidence of metastatic disease. 4. Grossly stable descending thoracic aortic aneurysm measuring up to 4.9 cm in diameter. Consider continued annual CTA follow-up. 5. Aortic Atherosclerosis (ICD10-I70.0) and Emphysema (ICD10-J43.9). Electronically Signed   By: Richardean Sale M.D.   On: 02/17/2020 16:58    Wt Readings from Last 3 Encounters:  02/17/20 49.7 kg  02/17/20 51 kg  02/10/20 50.9 kg    EKG: Atrial fibrillation with rapid ventricular response. No ischemia  Physical Exam:  Blood pressure (!) 124/110, pulse 91, temperature 98.6 F (37 C), temperature source Oral, resp. rate (!) 25, height 5\' 3"  (1.6 m), weight 49.7 kg, SpO2 97 %. Body mass index is 19.4 kg/m. General: Well developed, well nourished, in no acute distress. Head: Normocephalic, atraumatic, sclera non-icteric, no xanthomas, nares are without discharge.  Neck: Negative for carotid bruits. JVD not elevated. Lungs: Decreased breath sounds bilaterally. Bilateral rhonchi. Heart: Irregular rate and rhythm. Abdomen: Soft, non-tender, non-distended with normoactive bowel sounds. No hepatomegaly. No rebound/guarding. No obvious abdominal masses. Msk:  Strength and tone appear normal for age. Extremities: No clubbing or cyanosis. No edema.  Distal pedal pulses are 2+ and equal bilaterally. Neuro: Alert and oriented X 3. No facial asymmetry. No focal deficit. Moves all extremities spontaneously. Psych:  Responds to questions appropriately with a normal affect.     Assessment and Plan   86 y.o. female with history of stage II left lower lobe lung malignancy, atrial fibrillation who was admitted after being noted to have increased shortness of breath, peripheral edema with rapid heart rate.  She did have oncology clinic on day of admission for  consideration of cycle 4 of her weekly chemotherapy which included Taxol and carboplatinum.  She had increased weakness fatigue and shortness of breath as well as dyspnea on exertion.  She has poor p.o. intake.  EKG showed atrial fibrillation with increased ventricular response.  She was scheduled for admission for further treatment.  BNP was 713 with no recent value for comparison for baseline.  She had stable chronic renal insufficiency with a GFR of 35.  She was Covid negative.  Chest CT revealed no pulmonary embolus or acute vascular findings.  There was a new moderate right and small left dependent pleural effusion associated with compressive atelectasis at the lung bases.  Left lower lobe mass wonders if anything smaller.  She had a stable 4.9 cm ascending thoracic aortic aneurysm.  1. Atrial fibrillation-has had this as an outpatient. Was on metoprolol and apixaban as an outpatient. Placed on a Cardizem drip. This was discontinued this morning after rate improved. Will place on Cardizem 30 every 6 in addition to the metoprolol. Continue with Eliquis at 2.5 mg twice daily. Will follow rate and hemodynamics. Attempt will be to discharge on Cardizem 120 once daily along with the metoprolol succinate 50 mg twice daily and Eliquis 2.5 mg twice daily. Follow-up will be with Dr. Nehemiah Massed in 1 week after discharge. This appointment has been arranged.  2. Lung carcinoma-being closely followed by oncology  3. Pleural effusion/CHF-echo done as an outpatient 2 weeks ago revealed preserved LV function ejection fraction 55% with moderate MR moderate TR. Has HFpEF. We will continue with IV diuresis as you are doing following hemodynamics, renal function and electrolytes. We will repeat BNP today.  Signed, Teodoro Spray MD 02/18/2020, 7:52 AM Pager: [phone]

## 2020-02-18 NOTE — Progress Notes (Signed)
   02/18/20 0149  Vitals  BP 101/65  MAP (mmHg) 77  BP Location Left Arm  BP Method Automatic  ECG Heart Rate 65  Cardiac Rhythm Atrial fibrillation  Resp (!) 29  Oxygen Therapy  SpO2 94 %  O2 Device Room Air  Patient Activity (if Appropriate) In bed  Pulse Oximetry Type Continuous  MEWS Score  MEWS Temp 0  MEWS Systolic 0  MEWS Pulse 0  MEWS RR 2  MEWS LOC 0  MEWS Score 2  MEWS Score Color Yellow

## 2020-02-18 NOTE — Progress Notes (Addendum)
Cardizem drip d/c'd. Jonny Ruiz APP on call notified. Also discussed patient's tachypnea. No orders received except check another temp. Patient is afebrile.

## 2020-02-19 LAB — BASIC METABOLIC PANEL
Anion gap: 9 (ref 5–15)
BUN: 28 mg/dL — ABNORMAL HIGH (ref 8–23)
CO2: 30 mmol/L (ref 22–32)
Calcium: 8.2 mg/dL — ABNORMAL LOW (ref 8.9–10.3)
Chloride: 99 mmol/L (ref 98–111)
Creatinine, Ser: 1.31 mg/dL — ABNORMAL HIGH (ref 0.44–1.00)
GFR calc Af Amer: 41 mL/min — ABNORMAL LOW (ref >60)
GFR calc non Af Amer: 36 mL/min — ABNORMAL LOW (ref >60)
Glucose, Bld: 106 mg/dL — ABNORMAL HIGH (ref 70–99)
Potassium: 3.7 mmol/L (ref 3.5–5.1)
Sodium: 138 mmol/L (ref 135–145)

## 2020-02-19 LAB — CBC
HCT: 36.4 % (ref 36.0–46.0)
Hemoglobin: 11.6 g/dL — ABNORMAL LOW (ref 12.0–15.0)
MCH: 29.8 pg (ref 26.0–34.0)
MCHC: 31.9 g/dL (ref 30.0–36.0)
MCV: 93.6 fL (ref 80.0–100.0)
Platelets: 193 10*3/uL (ref 150–400)
RBC: 3.89 MIL/uL (ref 3.87–5.11)
RDW: 18.7 % — ABNORMAL HIGH (ref 11.5–15.5)
WBC: 5 10*3/uL (ref 4.0–10.5)
nRBC: 0 % (ref 0.0–0.2)

## 2020-02-19 LAB — MAGNESIUM: Magnesium: 2 mg/dL (ref 1.7–2.4)

## 2020-02-19 LAB — PHOSPHORUS: Phosphorus: 3.5 mg/dL (ref 2.5–4.6)

## 2020-02-19 MED ORDER — DILTIAZEM HCL ER COATED BEADS 120 MG PO CP24
120.0000 mg | ORAL_CAPSULE | Freq: Every day | ORAL | Status: DC
  Administered 2020-02-19 – 2020-02-20 (×2): 120 mg via ORAL
  Filled 2020-02-19 (×2): qty 1

## 2020-02-19 MED ORDER — FUROSEMIDE 20 MG PO TABS
10.0000 mg | ORAL_TABLET | Freq: Every day | ORAL | Status: DC
  Administered 2020-02-19 – 2020-02-20 (×2): 10 mg via ORAL
  Filled 2020-02-19 (×2): qty 1

## 2020-02-19 MED ORDER — ENSURE ENLIVE PO LIQD
237.0000 mL | Freq: Two times a day (BID) | ORAL | Status: DC
  Administered 2020-02-19 – 2020-02-20 (×2): 237 mL via ORAL

## 2020-02-19 NOTE — Progress Notes (Signed)
[facility]  ROUNDING NOTE   Subjective:   UOP 1930mL Furosemide IV stopped yesterday.   Daughter at bedside.   Objective:  Vital signs in last 24 hours:  Temp:  [97.9 F (36.6 C)-98.6 F (37 C)] 97.9 F (36.6 C) (04/04 0734) Pulse Rate:  [64-96] 64 (04/04 0734) Resp:  [16-20] 16 (04/04 0734) BP: (108-140)/(69-94) 116/73 (04/04 0734) SpO2:  [92 %-97 %] 93 % (04/04 0734) Weight:  [47.1 kg] 47.1 kg (04/04 0414)  Weight change: -2.586 kg Filed Weights   02/17/20 2310 02/19/20 0414  Weight: 49.7 kg 47.1 kg    Intake/Output: I/O last 3 completed shifts: In: 480 [P.O.:480] Out: 2671 [Urine:3175]   Intake/Output this shift:  No intake/output data recorded.  Physical Exam: General: NAD, laying in bed  Head: Normocephalic, atraumatic. Moist oral mucosal membranes  Eyes: Anicteric, PERRL  Neck: Supple, trachea midline  Lungs:  Clear to auscultation  Heart: Regular rate and rhythm  Abdomen:  Soft, nontender,   Extremities:  no peripheral edema.  Neurologic: Nonfocal, moving all four extremities  Skin: No lesions        Basic Metabolic Panel: Recent Labs  Lab 02/17/20 0828 02/17/20 0828 02/17/20 1921 02/18/20 0405 02/19/20 0548  NA 139  --  140 139 138  K 5.7*  --  4.8 3.9 3.7  CL 104  --  104 103 99  CO2 27  --  27 25 30   GLUCOSE 148*  --  102* 89 106*  BUN 31*  --  32* 32* 28*  CREATININE 1.32*  --  1.34* 1.16* 1.31*  CALCIUM 9.0   < > 8.3* 8.1* 8.2*  MG  --   --   --   --  2.0  PHOS  --   --   --   --  3.5   < > = values in this interval not displayed.    Liver Function Tests: Recent Labs  Lab 02/17/20 0828  AST 30  ALT 30  ALKPHOS 93  BILITOT 0.6  PROT 6.9  ALBUMIN 3.5   No results for input(s): LIPASE, AMYLASE in the last 168 hours. No results for input(s): AMMONIA in the last 168 hours.  CBC: Recent Labs  Lab 02/17/20 0828 02/18/20 0405 02/19/20 0548  WBC 4.7 4.6 5.0  NEUTROABS 3.8  --   --   HGB 12.1 10.4* 11.6*   HCT 37.9 33.2* 36.4  MCV 93.8 94.9 93.6  PLT 178 168 193    Cardiac Enzymes: No results for input(s): CKTOTAL, CKMB, CKMBINDEX, TROPONINI in the last 168 hours.  BNP: Invalid input(s): POCBNP  CBG: Recent Labs  Lab 02/17/20 1807  GLUCAP 63    Microbiology: Results for orders placed or performed during the hospital encounter of 02/17/20  SARS CORONAVIRUS 2 (TAT 6-24 HRS) Nasopharyngeal Nasopharyngeal Swab     Status: None   Collection Time: 02/17/20  3:00 PM   Specimen: Nasopharyngeal Swab  Result Value Ref Range Status   SARS Coronavirus 2 NEGATIVE NEGATIVE Final    Comment: (NOTE) SARS-CoV-2 target nucleic acids are NOT DETECTED. The SARS-CoV-2 RNA is generally detectable in upper and lower respiratory specimens during the acute phase of infection. Negative results do not preclude SARS-CoV-2 infection, do not rule out co-infections with other pathogens, and should not be used as the sole basis for treatment or other patient management decisions. Negative results must be combined with clinical observations, patient history, and epidemiological information. The expected result is Negative. Fact Sheet for  Patients: SugarRoll.be Fact Sheet for Healthcare Providers: https://www.woods-mathews.com/ This test is not yet approved or cleared by the [place] FDA and  has been authorized for detection and/or diagnosis of SARS-CoV-2 by FDA under an Emergency Use Authorization (EUA). This EUA will remain  in effect (meaning this test can be used) for the duration of the COVID-19 declaration under Section 56 4(b)(1) of the Act, 21 U.S.C. section 360bbb-3(b)(1), unless the authorization is terminated or revoked sooner. Performed at [facility], [place] [address] [place], [place] [address]     Coagulation Studies: No results for input(s): LABPROT, INR in the last 72 hours.  Urinalysis: No results for input(s): COLORURINE,  LABSPEC, PHURINE, GLUCOSEU, HGBUR, BILIRUBINUR, KETONESUR, PROTEINUR, UROBILINOGEN, NITRITE, LEUKOCYTESUR in the last 72 hours.  Invalid input(s): APPERANCEUR    Imaging: CT ANGIO CHEST PE W OR WO CONTRAST  Result Date: 02/17/2020 CLINICAL DATA:  Shortness of breath with exertion. Lung cancer. Clinical concern for pulmonary embolism. EXAM: CT ANGIOGRAPHY CHEST WITH CONTRAST TECHNIQUE: Multidetector CT imaging of the chest was performed using the standard protocol during bolus administration of intravenous contrast. Multiplanar CT image reconstructions and MIPs were obtained to evaluate the vascular anatomy. CONTRAST:  28mL OMNIPAQUE IOHEXOL 350 MG/ML SOLN COMPARISON:  PET-CT 11/09/2019. Chest CTA 11/01/2019. FINDINGS: Cardiovascular: The pulmonary arteries are well opacified with contrast to the level of the subsegmental branches. There is no evidence of acute pulmonary embolism. There is extensive severe atherosclerosis of the aorta, great vessels and coronary arteries. There is limited opacification of the systemic arteries. Aneurysmal dilatation and irregular mural thrombus throughout the descending thoracic aorta are grossly stable. The aneurysm has a maximal diameter of 4.9 cm on sagittal image 64/8, similar to previous study. No apparent acute arterial abnormalities. The heart size is normal. There is no pericardial effusion. Mediastinum/Nodes: There are no enlarged mediastinal, hilar or axillary lymph nodes. The thyroid gland, trachea and esophagus demonstrate no significant findings. Lungs/Pleura: There are new moderate right and small left dependent pleural effusions with associated increased compressive atelectasis at both lung bases. The medial left lower lobe mass adjacent to the descending aorta appears slightly smaller, measuring 3.7 x 2.4 cm on image 59/6. Previously 4.6 x 3.5 cm, and hypermetabolic on PET-CT. Stable bandlike fibrosis in the right upper lobe, likely related to previous therapy.  Underlying moderate centrilobular emphysema. Upper abdomen: The visualized upper abdomen appears stable. There is stable low-density enlargement the left adrenal gland with central calcifications. There is diffuse aortic and branch vessel atherosclerosis. Musculoskeletal/Chest wall: There is no chest wall mass or suspicious osseous finding. Stable old right-sided rib fractures and chronic thoracic compression deformities. Review of the MIP images confirms the above findings. IMPRESSION: 1. No evidence of acute pulmonary embolism or other acute vascular findings. 2. New moderate right and small left dependent pleural effusions with associated increased compressive atelectasis at both lung bases. 3. The medial left lower lobe mass appears slightly smaller, and was hypermetabolic on previous PET-CT, presumably treated lung cancer. No evidence of metastatic disease. 4. Grossly stable descending thoracic aortic aneurysm measuring up to 4.9 cm in diameter. Consider continued annual CTA follow-up. 5. Aortic Atherosclerosis (ICD10-I70.0) and Emphysema (ICD10-J43.9). Electronically Signed   By: Richardean Sale M.D.   On: 02/17/2020 16:58     Medications:    . apixaban  2.5 mg Oral BID  . atorvastatin  10 mg Oral q1800  . calcium-vitamin D  1 tablet Oral Daily  . diltiazem  30 mg Oral Q6H  .  metoprolol succinate  50 mg Oral BID  . sodium chloride flush  3 mL Intravenous Q12H   acetaminophen **OR** acetaminophen, guaiFENesin, ipratropium-albuterol, ondansetron **OR** ondansetron (ZOFRAN) IV, traMADol  Assessment/ Plan:  [name] [name] is a 86 y.o. white female with history of lung cancer, coronary artery disease, hypertension, primary hyperparathyroidism, breast cancer, hyperlpidemia, peripheral vascular disease who is admitted to [facility] on 02/17/2020 for Rapid atrial fibrillation ([place]) [I48.91]  1. Acute renal failure on chronic kidney disease stage IIIB with proteinuria: baseline creatinine of  1.25, GFR of 38 on 02/10/20 Chronic kidney disease secondary to renal artery stenosis.  Acute renal failure secondary to poor perfusion from atrial fibrillation. Now with IV contrast exposure with CTA on 4/2  2. Hypertension: recently taken off lisinopril due to her chronic cough.  With atrial fibrillation with rapid ventricular rate.  - agree with furosemide. Change to PO 10mg  daily - Continue diltiazem and metoprolol for rate control.   Outpatient follow up with Dr. Holley Raring on 4/8.    LOS: 2 [name] [name] 4/4/20219:39 AM

## 2020-02-19 NOTE — Progress Notes (Signed)
[facility] Progress Note  Patient: [name] [name] [facility]    VEL:[id number]  DOA: 02/17/2020     Date of Service: the patient was seen and examined on 02/19/2020  No chief complaint on file.  Brief hospital course: [name] [name] is a 86 y.o. female with medical history significant for atrial fibrillation on chronic anticoagulation therapy, history of lung cancer on chemo and radiation therapy.  She was sent to the hospital to be directly admitted from the oncologist office due to worsening shortness of breath from her baseline with exertion and palpitations.  Patient states that she has shortness of breath with exertion related to her COPD but that this has worsened over the last couple of weeks.  She denies having any chest pain and at times she has trouble sleeping due to difficulty breathing.  She has no lower extremity swelling. She has a cough which is nonproductive.  Denies having any nausea, vomiting, abdominal pain or changes in her bowel habits.  She denies having any urinary symptoms. In the oncologist office she was noted to be tachycardic with heart rate in the 120s.  Labs also revealed hyperkalemia.  She had a CT angiogram of the chest which showed bilateral pleural effusions is negative for acute PE. She will be admitted to the hospital for further evaluation  Currently further plan is continue oral Cardizem and monitor heart rate, plan to discharge tomorrow if heart rate remains stable.  Assessment and Plan:  Principal Problem:   Acute CHF (congestive heart failure) (HCC) Active Problems:   Benign essential hypertension   Hyperkalemia   Primary cancer of right upper lobe of lung (HCC)   Rapid atrial fibrillation (HCC)   Emphysema lung (HCC)     Atrial fibrillation with rapid ventricular rate Patient has a known history of A. fib and presents in a rapid ventricular rate S/p Cardizem drip, weaned off on 4/3, started Cardizem 30 mg p.o. every 6 hourly 4/4  started Cardizem 120 mg p.o. daily TSH levels 2.0 normal Increased Toprol-XL 50 mg p.o. twice daily Continue Eliquis 2.5 mg p.o. twice daily for VTE prophylaxis Cardiology consult appreciated, recommended to discharge on Cardizem 120 mg p.o. daily and metoprolol succinate 50 twice daily with Eliquis 2.5 and follow-up as an outpatient in 1 week    Shortness of breath, multifactorial, could be secondary to A. fib with RVR VS HFpEF, and  bilateral pleural effusion Patient presents for worsening shortness of breath exertion associated with orthopnea. Imaging shows bilateral pleural effusions SLB improved after optimization of heart rate  S/p Lasix 40 mg IV every 12h, 4/4 started Lasix 10 mg p.o. daily as per nephrologist TTE 2 weeks ago done by cardiologist, LVEF 55%, moderate MR, moderate TR     History of lung cancer Follow-up with oncology as an outpatient Bilateral pleural effusion R > L patient's family is concerned about pleural effusion and they would like thoracentesis to be done before discharge. Follow UA thoracentesis tomorrow a.m. with fluid studies and cytology patient can be discharged home tomorrow 02/20/20 after thoracentesis and follow-up with oncologist as an outpatient  Emphysema Continue as needed bronchodilator therapy and inhaled steroids  Hypertension Continue metoprolol   Hyperkalemia, resolved Treat with dextrose, insulin and bronchodilators Repeat potassium levels 3.9  CKD stage III Creatinine stable Monitor renal output and renal functions Family requested nephrology consult  Ascending thoracic aneurysm 4.9 cm, follow with PCP and vascular surgery as an outpatient   Body mass index is 18.39 kg/m.  Interventions:  Diet: Heart healthy DVT Prophylaxis: Therapeutic Anticoagulation with Eliquis   Advance goals of care discussion: DNR  Family Communication: family was present at bedside, at the time of interview.  The pt provided permission to  discuss medical plan with the family. Opportunity was given to ask question and all questions were answered satisfactorily.   Disposition:  Pt is from Home, admitted with palpitations and shortness of breath.  Patient was on Cardizem IV infusion, which has been switched to oral Cardizem. Patient also has pleural effusion and family wants thoracentesis to be done before discharge. So patient can be discharged home tomorrow on 02/20/2020 after thoracentesis    Subjective: Patient was seen and examined at bedside, no overnight issues. Patient feels improvement in the shortness of breath, palpitations has been resolved.  Patient has no shortness of breath at rest but feels dyspnea on exertion.  Denies any chest pain, no abdominal pain, no nausea vomiting or diarrhea.  Physical Exam: General:  alert oriented to time, place, and person.  Appear in mild distress, affect appropriate Eyes: PERRLA ENT: Oral Mucosa Clear, moist  Neck: no JVD,  Cardiovascular: S1 and S2 Present, no Murmur, irregular rhythm Respiratory: good respiratory effort, Bilateral Air entry equal and Decreased, bibasilar crackles, no wheezes Abdomen: Bowel Sound present, Soft and no tenderness,  Skin: no rashes Extremities: no Pedal edema, no calf tenderness Neurologic: without any new focal findings Gait not checked due to patient safety concerns  Vitals:   02/18/20 2347 02/19/20 0414 02/19/20 0734 02/19/20 1127  BP: 118/81 108/69 116/73 120/67  Pulse: 90 69 64 64  Resp: 18 18 16 16   Temp: 98.2 F (36.8 C) 98 F (36.7 C) 97.9 F (36.6 C) 97.6 F (36.4 C)  TempSrc: Oral Oral Oral   SpO2: 93% 97% 93% 94%  Weight:  47.1 kg    Height:        Intake/Output Summary (Last 24 hours) at 02/19/2020 1511 Last data filed at 02/19/2020 1200 Gross per 24 hour  Intake 830 ml  Output 1975 ml  Net -1145 ml   Filed Weights   02/17/20 2310 02/19/20 0414  Weight: 49.7 kg 47.1 kg    Data Reviewed: I have personally reviewed and  interpreted daily labs, tele strips, imagings as discussed above. I reviewed all nursing notes, pharmacy notes, vitals, pertinent old records I have discussed plan of care as described above with RN and patient/family.  CBC: Recent Labs  Lab 02/17/20 0828 02/18/20 0405 02/19/20 0548  WBC 4.7 4.6 5.0  NEUTROABS 3.8  --   --   HGB 12.1 10.4* 11.6*  HCT 37.9 33.2* 36.4  MCV 93.8 94.9 93.6  PLT 178 168 025   Basic Metabolic Panel: Recent Labs  Lab 02/17/20 0828 02/17/20 1921 02/18/20 0405 02/19/20 0548  NA 139 140 139 138  K 5.7* 4.8 3.9 3.7  CL 104 104 103 99  CO2 27 27 25 30   GLUCOSE 148* 102* 89 106*  BUN 31* 32* 32* 28*  CREATININE 1.32* 1.34* 1.16* 1.31*  CALCIUM 9.0 8.3* 8.1* 8.2*  MG  --   --   --  2.0  PHOS  --   --   --  3.5    Studies: No results found.  Scheduled Meds: . apixaban  2.5 mg Oral BID  . atorvastatin  10 mg Oral q1800  . calcium-vitamin D  1 tablet Oral Daily  . diltiazem  120 mg Oral Daily  . feeding supplement (ENSURE ENLIVE)  237  mL Oral BID BM  . furosemide  10 mg Oral Daily  . metoprolol succinate  50 mg Oral BID  . sodium chloride flush  3 mL Intravenous Q12H   Continuous Infusions: PRN Meds: acetaminophen **OR** acetaminophen, guaiFENesin, ipratropium-albuterol, ondansetron **OR** ondansetron (ZOFRAN) IV, traMADol  Time spent: 35 minutes  Author: Val Riles. MD [facility] 02/19/2020 3:11 PM  To reach On-call, see care teams to locate the attending and reach out to them via [email or web address]. If 7PM-7AM, please contact night-coverage If you still have difficulty reaching the attending provider, please page the [facility] (Director on Call) for [facility] on amion for assistance.

## 2020-02-19 NOTE — Progress Notes (Signed)
Patient Name: [name] [name] [facility] Date of Encounter: 02/19/2020  Hospital Problem List     Principal Problem:   Acute CHF (congestive heart failure) ([place]) Active Problems:   Benign essential hypertension   Hyperkalemia   Primary cancer of right upper lobe of lung (HCC)   Rapid atrial fibrillation (HCC)   Emphysema lung [facility])    Patient Profile      86 y.o. female with history of stage II left lower lobe lung malignancy, atrial fibrillation who was admitted after being noted to have increased shortness of breath, peripheral edema with rapid heart rate.  She did have oncology clinic on day of admission for consideration of cycle 4 of her weekly chemotherapy which included Taxol and carboplatinum.  She had increased weakness fatigue and shortness of breath as well as dyspnea on exertion.  She has poor p.o. intake.  EKG showed atrial fibrillation with increased ventricular response.  She was scheduled for admission for further treatment.  BNP was 713 with no recent value for comparison for baseline.  She had stable chronic renal insufficiency with a GFR of 35.  She was Covid negative.  Chest CT revealed no pulmonary embolus or acute vascular findings.  There was a new moderate right and small left dependent pleural effusion associated with compressive atelectasis at the lung bases.  Left lower lobe mass wonders if anything smaller.  She had a stable 4.9 cm ascending thoracic aortic aneurysm.  She was placed on a Cardizem drip and continued with apixaban at 2.5 mg twice daily for her decreased renal function and low body weight as well as age.  She was treated with IV furosemide.   Subjective   Feels less short of breath today.  Inpatient Medications    . apixaban  2.5 mg Oral BID  . atorvastatin  10 mg Oral q1800  . calcium-vitamin D  1 tablet Oral Daily  . diltiazem  30 mg Oral Q6H  . furosemide  10 mg Oral Daily  . metoprolol succinate  50 mg Oral BID  . sodium chloride flush  3 mL  Intravenous Q12H    Vital Signs    Vitals:   02/18/20 2347 02/19/20 0414 02/19/20 0734 02/19/20 1127  BP: 118/81 108/69 116/73 120/67  Pulse: 90 69 64 64  Resp: 18 18 16 16   Temp: 98.2 F (36.8 C) 98 F (36.7 C) 97.9 F (36.6 C) 97.6 F (36.4 C)  TempSrc: Oral Oral Oral   SpO2: 93% 97% 93% 94%  Weight:  47.1 kg    Height:        Intake/Output Summary (Last 24 hours) at 02/19/2020 1139 Last data filed at 02/19/2020 1126 Gross per 24 hour  Intake 240 ml  Output 1625 ml  Net -1385 ml   Filed Weights   02/17/20 2310 02/19/20 0414  Weight: 49.7 kg 47.1 kg    Physical Exam    GEN: Well nourished, well developed, in no acute distress.  HEENT: normal.  Neck: Supple, no JVD, carotid bruits, or masses. Cardiac: RRR, no murmurs, rubs, or gallops. No clubbing, cyanosis, edema.  Radials/DP/PT 2+ and equal bilaterally.  Respiratory:  Respirations regular and unlabored, clear to auscultation bilaterally. GI: Soft, nontender, nondistended, BS + x 4. MS: no deformity or atrophy. Skin: warm and dry, no rash. Neuro:  Strength and sensation are intact. Psych: Normal affect.  Labs    CBC Recent Labs    02/17/20 0828 02/17/20 0828 02/18/20 0405 02/19/20 0548  WBC 4.7   < >  4.6 5.0  NEUTROABS 3.8  --   --   --   HGB 12.1   < > 10.4* 11.6*  HCT 37.9   < > 33.2* 36.4  MCV 93.8   < > 94.9 93.6  PLT 178   < > 168 193   < > = values in this interval not displayed.   Basic Metabolic Panel Recent Labs    02/18/20 0405 02/19/20 0548  NA 139 138  K 3.9 3.7  CL 103 99  CO2 25 30  GLUCOSE 89 106*  BUN 32* 28*  CREATININE 1.16* 1.31*  CALCIUM 8.1* 8.2*  MG  --  2.0  PHOS  --  3.5   Liver Function Tests Recent Labs    02/17/20 0828  AST 30  ALT 30  ALKPHOS 93  BILITOT 0.6  PROT 6.9  ALBUMIN 3.5   No results for input(s): LIPASE, AMYLASE in the last 72 hours. Cardiac Enzymes No results for input(s): CKTOTAL, CKMB, CKMBINDEX, TROPONINI in the last 72  hours. BNP Recent Labs    02/17/20 1921 02/18/20 0405  BNP 713.0* 588.0*   D-Dimer No results for input(s): DDIMER in the last 72 hours. Hemoglobin A1C No results for input(s): HGBA1C in the last 72 hours. Fasting Lipid Panel No results for input(s): CHOL, HDL, LDLCALC, TRIG, CHOLHDL, LDLDIRECT in the last 72 hours. Thyroid Function Tests Recent Labs    02/17/20 1350  TSH 2.038    Telemetry    Normal sinus rhythm.  Previously A. fib  ECG    A. fib with variable ventricular response  Radiology    CT ANGIO CHEST PE W OR WO CONTRAST  Result Date: 02/17/2020 CLINICAL DATA:  Shortness of breath with exertion. Lung cancer. Clinical concern for pulmonary embolism. EXAM: CT ANGIOGRAPHY CHEST WITH CONTRAST TECHNIQUE: Multidetector CT imaging of the chest was performed using the standard protocol during bolus administration of intravenous contrast. Multiplanar CT image reconstructions and MIPs were obtained to evaluate the vascular anatomy. CONTRAST:  35mL OMNIPAQUE IOHEXOL 350 MG/ML SOLN COMPARISON:  PET-CT 11/09/2019. Chest CTA 11/01/2019. FINDINGS: Cardiovascular: The pulmonary arteries are well opacified with contrast to the level of the subsegmental branches. There is no evidence of acute pulmonary embolism. There is extensive severe atherosclerosis of the aorta, great vessels and coronary arteries. There is limited opacification of the systemic arteries. Aneurysmal dilatation and irregular mural thrombus throughout the descending thoracic aorta are grossly stable. The aneurysm has a maximal diameter of 4.9 cm on sagittal image 64/8, similar to previous study. No apparent acute arterial abnormalities. The heart size is normal. There is no pericardial effusion. Mediastinum/Nodes: There are no enlarged mediastinal, hilar or axillary lymph nodes. The thyroid gland, trachea and esophagus demonstrate no significant findings. Lungs/Pleura: There are new moderate right and small left dependent  pleural effusions with associated increased compressive atelectasis at both lung bases. The medial left lower lobe mass adjacent to the descending aorta appears slightly smaller, measuring 3.7 x 2.4 cm on image 59/6. Previously 4.6 x 3.5 cm, and hypermetabolic on PET-CT. Stable bandlike fibrosis in the right upper lobe, likely related to previous therapy. Underlying moderate centrilobular emphysema. Upper abdomen: The visualized upper abdomen appears stable. There is stable low-density enlargement the left adrenal gland with central calcifications. There is diffuse aortic and branch vessel atherosclerosis. Musculoskeletal/Chest wall: There is no chest wall mass or suspicious osseous finding. Stable old right-sided rib fractures and chronic thoracic compression deformities. Review of the MIP images confirms the above findings.  IMPRESSION: 1. No evidence of acute pulmonary embolism or other acute vascular findings. 2. New moderate right and small left dependent pleural effusions with associated increased compressive atelectasis at both lung bases. 3. The medial left lower lobe mass appears slightly smaller, and was hypermetabolic on previous PET-CT, presumably treated lung cancer. No evidence of metastatic disease. 4. Grossly stable descending thoracic aortic aneurysm measuring up to 4.9 cm in diameter. Consider continued annual CTA follow-up. 5. Aortic Atherosclerosis (ICD10-I70.0) and Emphysema (ICD10-J43.9). Electronically Signed   By: Richardean Sale M.D.   On: 02/17/2020 16:58    Assessment & Plan    86 y.o.femalewith history ofstage II left lower lobe lung malignancy, atrial fibrillation who was admitted after being noted to have increased shortness of breath, peripheral edema with rapid heart rate. She did have oncology clinic on day of admission for consideration of cycle 4 of her weekly chemotherapy which included Taxol and carboplatinum. She had increased weakness fatigue and shortness of breath as  well as dyspnea on exertion. She has poor p.o. intake. EKG showed atrial fibrillation with increased ventricular response. She was scheduled for admission for further treatment. BNP was 713 with no recent value for comparison for baseline. She had stable chronic renal insufficiency with a GFR of 35. She was Covid negative. Chest CT revealed no pulmonary embolus or acute vascular findings. There was a new moderate right and small left dependent pleural effusion associated with compressive atelectasis at the lung bases. Left lower lobe mass wonders if anything smaller. She had a stable 4.9 cm ascending thoracic aortic aneurysm.  1. Atrial fibrillation-has had this as an outpatient. Was on metoprolol and apixaban as an outpatient. Placed on a Cardizem drip.  Currently is in sinus rhythm.  Continue with Eliquis at 2.5 mg twice daily. Will follow rate and hemodynamics.  Will change to Cardizem 120 once daily along with the metoprolol succinate 50 mg twice daily and Eliquis 2.5 mg twice daily. Follow-up will be with Dr. Nehemiah Massed in 1 week after discharge. This appointment has been arranged.  2. Lung carcinoma-being closely followed by oncology  3. Pleural effusion/CHF-echo done as an outpatient 2 weeks ago revealed preserved LV function ejection fraction 55% with moderate MR moderate TR. Has HFpEF.  BNP is improved to 588.  Plan is to undergo thoracentesis tomorrow.  Discussed this at length with the patient, her family member, nephrology, hospital length.  Consideration for discharge after thoracentesis tomorrow stable.   Signed, Javier Docker Jacqeline Broers MD 02/19/2020, 11:39 AM  Pager: [phone]

## 2020-02-20 ENCOUNTER — Ambulatory Visit: Payer: Medicare Other

## 2020-02-20 ENCOUNTER — Inpatient Hospital Stay: Payer: Medicare Other

## 2020-02-20 DIAGNOSIS — Z9889 Other specified postprocedural states: Secondary | ICD-10-CM

## 2020-02-20 DIAGNOSIS — C3432 Malignant neoplasm of lower lobe, left bronchus or lung: Secondary | ICD-10-CM

## 2020-02-20 LAB — BODY FLUID CELL COUNT WITH DIFFERENTIAL
Eos, Fluid: 0 %
Lymphs, Fluid: 48 %
Monocyte-Macrophage-Serous Fluid: 21 %
Neutrophil Count, Fluid: 31 %
Total Nucleated Cell Count, Fluid: 705 cu mm

## 2020-02-20 LAB — BASIC METABOLIC PANEL
Anion gap: 11 (ref 5–15)
BUN: 34 mg/dL — ABNORMAL HIGH (ref 8–23)
CO2: 29 mmol/L (ref 22–32)
Calcium: 8.4 mg/dL — ABNORMAL LOW (ref 8.9–10.3)
Chloride: 97 mmol/L — ABNORMAL LOW (ref 98–111)
Creatinine, Ser: 1.26 mg/dL — ABNORMAL HIGH (ref 0.44–1.00)
GFR calc Af Amer: 43 mL/min — ABNORMAL LOW (ref >60)
GFR calc non Af Amer: 37 mL/min — ABNORMAL LOW (ref >60)
Glucose, Bld: 90 mg/dL (ref 70–99)
Potassium: 4.1 mmol/L (ref 3.5–5.1)
Sodium: 137 mmol/L (ref 135–145)

## 2020-02-20 LAB — MAGNESIUM: Magnesium: 2.3 mg/dL (ref 1.7–2.4)

## 2020-02-20 LAB — CBC
HCT: 34.2 % — ABNORMAL LOW (ref 36.0–46.0)
Hemoglobin: 11 g/dL — ABNORMAL LOW (ref 12.0–15.0)
MCH: 30.1 pg (ref 26.0–34.0)
MCHC: 32.2 g/dL (ref 30.0–36.0)
MCV: 93.7 fL (ref 80.0–100.0)
Platelets: 176 10*3/uL (ref 150–400)
RBC: 3.65 MIL/uL — ABNORMAL LOW (ref 3.87–5.11)
RDW: 18.6 % — ABNORMAL HIGH (ref 11.5–15.5)
WBC: 5.2 10*3/uL (ref 4.0–10.5)
nRBC: 0 % (ref 0.0–0.2)

## 2020-02-20 LAB — HEPATIC FUNCTION PANEL
ALT: 17 U/L (ref 0–44)
AST: 17 U/L (ref 15–41)
Albumin: 3.1 g/dL — ABNORMAL LOW (ref 3.5–5.0)
Alkaline Phosphatase: 71 U/L (ref 38–126)
Bilirubin, Direct: 0.1 mg/dL (ref 0.0–0.2)
Indirect Bilirubin: 0.7 mg/dL (ref 0.3–0.9)
Total Bilirubin: 0.8 mg/dL (ref 0.3–1.2)
Total Protein: 6.2 g/dL — ABNORMAL LOW (ref 6.5–8.1)

## 2020-02-20 LAB — LACTATE DEHYDROGENASE: LDH: 132 U/L (ref 98–192)

## 2020-02-20 LAB — LACTATE DEHYDROGENASE, PLEURAL OR PERITONEAL FLUID: LD, Fluid: 74 U/L — ABNORMAL HIGH (ref 3–23)

## 2020-02-20 LAB — PROTEIN, PLEURAL OR PERITONEAL FLUID: Total protein, fluid: <3 g/dL

## 2020-02-20 LAB — PHOSPHORUS: Phosphorus: 4.3 mg/dL (ref 2.5–4.6)

## 2020-02-20 MED ORDER — FUROSEMIDE 20 MG PO TABS: 10.0000 mg | ORAL_TABLET | Freq: Every day | ORAL | 0 refills | Status: AC

## 2020-02-20 MED ORDER — DILTIAZEM HCL ER COATED BEADS 120 MG PO CP24: 120.0000 mg | ORAL_CAPSULE | Freq: Every day | ORAL | 0 refills | Status: AC

## 2020-02-20 MED ORDER — ENSURE ENLIVE PO LIQD: 237.0000 mL | Freq: Two times a day (BID) | ORAL | 12 refills | Status: DC

## 2020-02-20 NOTE — TOC Initial Note (Addendum)
Transition of Care [facility]) - Initial/Assessment Note    Patient Details  Name: [name] [name] MRN: [id number] Date of Birth: 02-23-1928  Transition of Care [facility]) CM/SW Contact:    Beverly Sessions, RN Phone Number: 02/20/2020, 3:37 PM  Clinical Narrative:                 Assessment was not completed prior to discharge  RNCM called and spoke with patient via phone  Patient states she lives at home with her daughter. Daughter provides transportation to appointments  PCP [facility] - denies issues obtaining medications  Patient states that she has a RW at home, however does not have a need for it  MD has ordered home health RN.  Patient states she does not have a preference of agency.  Referral made to [facility] with [place]  Patient states that she has a scale in the home to check her daily weights   Start of Care for home health 4/8.  Dr Posey Pronto in agreement   Expected Discharge Plan: [place] Barriers to Discharge: No Barriers Identified   Patient Goals and CMS Choice        Expected Discharge Plan and Services Expected Discharge Plan: [place]       Living arrangements for the past 2 months: Single Family Home Expected Discharge Date: 02/20/20                         HH Arranged: RN [place] Agency: [place] ([place]) Date HH Agency Contacted: 02/20/20   Representative spoke with at [place]: Corene Cornea  Prior Living Arrangements/Services Living arrangements for the past 2 months: [place] Lives with:: Adult Children Patient language and need for interpreter reviewed:: Yes Do you feel safe going back to the place where you live?: Yes      Need for Family Participation in Patient Care: Yes (Comment) Care giver support system in place?: Yes (comment) Current home services: DME Criminal Activity/Legal Involvement Pertinent to Current Situation/Hospitalization: No - Comment as  needed  Activities of Daily Living Home Assistive Devices/Equipment: None ADL Screening (condition at time of admission) Patient's cognitive ability adequate to safely complete daily activities?: Yes Is the patient deaf or have difficulty hearing?: No Does the patient have difficulty seeing, even when wearing glasses/contacts?: No Does the patient have difficulty concentrating, remembering, or making decisions?: No Patient able to express need for assistance with ADLs?: No Does the patient have difficulty dressing or bathing?: No Independently performs ADLs?: Yes (appropriate for developmental age) Does the patient have difficulty walking or climbing stairs?: Yes Weakness of Legs: Both Weakness of Arms/Hands: None  Permission Sought/Granted                  Emotional Assessment       Orientation: : Oriented to Self, Oriented to Place, Oriented to  Time, Oriented to Situation   Psych Involvement: No (comment)  Admission diagnosis:  Rapid atrial fibrillation [facility]) [I48.91] Patient Active Problem List   Diagnosis Date Noted  . S/P thoracentesis   . Malignant neoplasm of lower lobe of left lung ([place])   . Rapid atrial fibrillation ([place]) 02/17/2020  . Acute CHF (congestive heart failure) ([place]) 02/17/2020  . Emphysema lung ([place]) 02/17/2020  . Goals of care, counseling/discussion 01/13/2020  . Mass of lower lobe of left lung 12/22/2019  . 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 Acute kidney failure (  [place]) 10/18/2019  . Proteinuria 10/18/2019  . Chronic left-sided thoracic back pain 09/08/2019  . Secondary hyperparathyroidism of renal origin ([place]) 01/26/2019  . Pain in toes of both feet 04/14/2018  . Bradycardia 08/14/2016  . Primary cancer of lower-inner quadrant of left female breast ([place]) 07/17/2016  . Chronic low back pain 06/18/2016  . Combined fat and carbohydrate induced hyperlipemia 01/25/2016  . Myocardial infarction ([place]) 01/25/2016  . Current tobacco use 01/25/2016   . Chronic use of opiate drug for therapeutic purpose 01/17/2016  . Hyperkalemia 12/21/2015  . Dizziness 07/25/2015  . Immunizations incomplete 03/04/2015  . MI (mitral incompetence) 12/20/2014  . Simple chronic bronchitis ([place]) 12/15/2014  . Vasomotor rhinitis 12/15/2014  . Coronary artery disease of native artery of native heart with stable angina pectoris ([place]) 12/12/2014  . TI (tricuspid incompetence) 12/12/2014  . Breathlessness on exertion 12/12/2014  . Stage 3 chronic kidney disease 06/15/2014  . Benign essential hypertension 06/15/2014  . Primary cancer of right upper lobe of lung ([place]) 06/15/2014  . Osteoporosis, post-menopausal 06/15/2014  . Peripheral vascular disease ([place]) 06/15/2014  . Renal artery stenosis ([place]) 06/15/2014  . History of left breast cancer 11/18/2011  . History of myocardial infarction 05/17/2001   PCP:  Ezequiel Kayser, MD Pharmacy:   [id number] [place] [place] [address] Phone: [phone] Fax: [phone]  [facility] [place] [place] [facility] [address] [place] [address] Phone: [phone] Fax: [phone]     Social Determinants of Health (SDOH) Interventions    Readmission Risk Interventions Readmission Risk Prevention Plan 02/20/2020  Transportation Screening Complete  PCP or Specialist Appt within 3-5 Days Complete  HRI or Home Care Consult Complete  Palliative Care Screening Not Applicable  Medication Review (RN Care Manager) Complete  Some recent data might be hidden

## 2020-02-20 NOTE — Progress Notes (Signed)
[facility]  ROUNDING NOTE   Subjective:   Right thoracentesis this morning yielding 327mL.   Daughter at bedside.   Objective:  Vital signs in last 24 hours:  Temp:  [97.6 F (36.4 C)-98.4 F (36.9 C)] 98.2 F (36.8 C) (04/05 0733) Pulse Rate:  [64-69] 67 (04/05 1106) Resp:  [15-20] 16 (04/05 0733) BP: (95-127)/(63-84) 121/84 (04/05 1106) SpO2:  [89 %-97 %] 95 % (04/05 1106) Weight:  [47.2 kg] 47.2 kg (04/05 0420)  Weight change: 0.136 kg Filed Weights   02/17/20 2310 02/19/20 0414 02/20/20 0420  Weight: 49.7 kg 47.1 kg 47.2 kg    Intake/Output: I/O last 3 completed shifts: In: 940 [P.O.:940] Out: 1450 [Urine:1450]   Intake/Output this shift:  Total I/O In: 480 [P.O.:480] Out: 200 [Urine:200]  Physical Exam: General: NAD, laying in bed  Head: Normocephalic, atraumatic. Moist oral mucosal membranes  Eyes: Anicteric, PERRL  Neck: Supple, trachea midline  Lungs:  Clear to auscultation  Heart: Regular rate and rhythm  Abdomen:  Soft, nontender,   Extremities:  no peripheral edema.  Neurologic: Nonfocal, moving all four extremities  Skin: No lesions        Basic Metabolic Panel: Recent Labs  Lab 02/17/20 0828 02/17/20 0828 02/17/20 1921 02/17/20 1921 02/18/20 0405 02/19/20 0548 02/20/20 0405  NA 139  --  140  --  139 138 137  K 5.7*  --  4.8  --  3.9 3.7 4.1  CL 104  --  104  --  103 99 97*  CO2 27  --  27  --  25 30 29   GLUCOSE 148*  --  102*  --  89 106* 90  BUN 31*  --  32*  --  32* 28* 34*  CREATININE 1.32*  --  1.34*  --  1.16* 1.31* 1.26*  CALCIUM 9.0   < > 8.3*   < > 8.1* 8.2* 8.4*  MG  --   --   --   --   --  2.0 2.3  PHOS  --   --   --   --   --  3.5 4.3   < > = values in this interval not displayed.    Liver Function Tests: Recent Labs  Lab 02/17/20 0828 02/20/20 0405  AST 30 17  ALT 30 17  ALKPHOS 93 71  BILITOT 0.6 0.8  PROT 6.9 6.2*  ALBUMIN 3.5 3.1*   No results for input(s): LIPASE, AMYLASE in the last 168  hours. No results for input(s): AMMONIA in the last 168 hours.  CBC: Recent Labs  Lab 02/17/20 0828 02/18/20 0405 02/19/20 0548 02/20/20 0405  WBC 4.7 4.6 5.0 5.2  NEUTROABS 3.8  --   --   --   HGB 12.1 10.4* 11.6* 11.0*  HCT 37.9 33.2* 36.4 34.2*  MCV 93.8 94.9 93.6 93.7  PLT 178 168 193 176    Cardiac Enzymes: No results for input(s): CKTOTAL, CKMB, CKMBINDEX, TROPONINI in the last 168 hours.  BNP: Invalid input(s): POCBNP  CBG: Recent Labs  Lab 02/17/20 1807  GLUCAP 60    Microbiology: Results for orders placed or performed during the hospital encounter of 02/17/20  SARS CORONAVIRUS 2 (TAT 6-24 HRS) Nasopharyngeal Nasopharyngeal Swab     Status: None   Collection Time: 02/17/20  3:00 PM   Specimen: Nasopharyngeal Swab  Result Value Ref Range Status   SARS Coronavirus 2 NEGATIVE NEGATIVE Final    Comment: (NOTE) SARS-CoV-2 target nucleic acids are NOT  DETECTED. The SARS-CoV-2 RNA is generally detectable in upper and lower respiratory specimens during the acute phase of infection. Negative results do not preclude SARS-CoV-2 infection, do not rule out co-infections with other pathogens, and should not be used as the sole basis for treatment or other patient management decisions. Negative results must be combined with clinical observations, patient history, and epidemiological information. The expected result is Negative. Fact Sheet for Patients: SugarRoll.be Fact Sheet for Healthcare Providers: https://www.woods-mathews.com/ This test is not yet approved or cleared by the [place] FDA and  has been authorized for detection and/or diagnosis of SARS-CoV-2 by FDA under an Emergency Use Authorization (EUA). This EUA will remain  in effect (meaning this test can be used) for the duration of the COVID-19 declaration under Section 56 4(b)(1) of the Act, 21 U.S.C. section 360bbb-3(b)(1), unless the authorization is  terminated or revoked sooner. Performed at [facility], [place] [address] [place], [place] [address]     Coagulation Studies: No results for input(s): LABPROT, INR in the last 72 hours.  Urinalysis: No results for input(s): COLORURINE, LABSPEC, PHURINE, GLUCOSEU, HGBUR, BILIRUBINUR, KETONESUR, PROTEINUR, UROBILINOGEN, NITRITE, LEUKOCYTESUR in the last 72 hours.  Invalid input(s): APPERANCEUR    Imaging: DG Chest Port 1 View  Result Date: 02/20/2020 CLINICAL DATA:  Post right-sided thoracentesis. History of left lower lobe lung cancer. EXAM: PORTABLE CHEST 1 VIEW COMPARISON:  11/28/2019; chest CT-02/17/2020 FINDINGS: Grossly unchanged cardiac silhouette and mediastinal contours with atherosclerotic plaque within the thoracic aorta. Interval reduction in persistent small partially loculated right-sided effusion post thoracentesis. No pneumothorax. Grossly unchanged right basilar opacities, atelectasis versus infiltrate. Grossly unchanged nodular somewhat spiculated opacity within the right upper lung adjacent to the minor fissure. Grossly unchanged appearance of known left lower lung/retrocardiac opacity. The left lung remains otherwise well aerated. No definite left-sided pleural effusion. No evidence of edema. No acute osseous abnormalities. IMPRESSION: 1. Interval reduction in persistent small partially loculated right-sided effusion post thoracentesis. No pneumothorax. 2. Grossly unchanged right basilar opacities, atelectasis versus infiltrate. 3. Grossly unchanged pleuroparenchymal thickening about the right minor fissure and known left lower lung/retrocardiac opacity compatible with known history of lung cancer. Electronically Signed   By: Sandi Mariscal M.D.   On: 02/20/2020 11:38   US THORACENTESIS ASP PLEURAL SPACE W/IMG GUIDE  Result Date: 02/20/2020 INDICATION: Patient with history of atrial fibrillation, lung cancer currently receiving chemo radiation noted to have bilateral pleural  effusions right greater than left. Request to IR for diagnostic and therapeutic thoracentesis. EXAM: ULTRASOUND GUIDED RIGHT THORACENTESIS MEDICATIONS: 10 mL 1% lidocaine COMPLICATIONS: None immediate. PROCEDURE: An ultrasound guided thoracentesis was thoroughly discussed with the patient and questions answered. The benefits, risks, alternatives and complications were also discussed. The patient understands and wishes to proceed with the procedure. Written consent was obtained. Ultrasound was performed to localize and mark an adequate pocket of fluid in the right chest. The area was then prepped and draped in the normal sterile fashion. 1% Lidocaine was used for local anesthesia. Under ultrasound guidance a 6 Fr Safe-T-Centesis catheter was introduced. Thoracentesis was performed. The catheter was removed and a dressing applied. FINDINGS: A total of approximately 300 mL of clear yellow fluid was removed. Samples were sent to the laboratory as requested by the clinical team. IMPRESSION: Successful ultrasound guided right thoracentesis yielding 300 mL of pleural fluid. Read by Candiss Norse, PA-C Electronically Signed   By: Sandi Mariscal M.D.   On: 02/20/2020 11:08     Medications:    .  apixaban  2.5 mg Oral BID  . atorvastatin  10 mg Oral q1800  . calcium-vitamin D  1 tablet Oral Daily  . diltiazem  120 mg Oral Daily  . feeding supplement (ENSURE ENLIVE)  237 mL Oral BID BM  . furosemide  10 mg Oral Daily  . metoprolol succinate  50 mg Oral BID  . sodium chloride flush  3 mL Intravenous Q12H   acetaminophen **OR** acetaminophen, guaiFENesin, ipratropium-albuterol, ondansetron **OR** ondansetron (ZOFRAN) IV, traMADol  Assessment/ Plan:  [name] is a 86 y.o. white female with history of lung cancer, coronary artery disease, hypertension, primary hyperparathyroidism, breast cancer, hyperlpidemia, peripheral vascular disease who is admitted to [facility] on 02/17/2020 for Rapid atrial  fibrillation ([place]) [I48.91]  1. Acute renal failure on chronic kidney disease stage IIIB with proteinuria: baseline creatinine of 1.25, GFR of 38 on 02/10/20 Chronic kidney disease secondary to renal artery stenosis.  Acute renal failure secondary to poor perfusion from atrial fibrillation. Now with IV contrast exposure with CTA on 4/2  2. Hypertension: recently taken off lisinopril due to her chronic cough.  With atrial fibrillation with rapid ventricular rate.  - furosemide 10mg  daily - Continue diltiazem and metoprolol for rate control.   Outpatient follow up with Dr. Holley Raring on 4/8 at 2pm.    LOS: 3 Nichael Ehly 4/5/20213:20 PM

## 2020-02-20 NOTE — Procedures (Signed)
PROCEDURE SUMMARY:  Successful image-guided right thoracentesis. Yielded 300 milliliters of clear yellow fluid. Patient tolerated procedure well. EBL: Zero No immediate complications.  Specimen was sent for labs. Post procedure CXR shows no pneumothorax.  Please see imaging section of Epic for full dictation.  Joaquim Nam PA-C 02/20/2020 11:07 AM

## 2020-02-20 NOTE — Discharge Summary (Addendum)
[name] [name] at [place] NAME: [name] [name]    MR#:  [id number]  DATE OF BIRTH:  Aug 21, 1928  DATE OF ADMISSION:  02/17/2020 ADMITTING PHYSICIAN: Collier Bullock, MD  DATE OF DISCHARGE: 02/20/2020  PRIMARY CARE PHYSICIAN: Ezequiel Kayser, MD    ADMISSION DIAGNOSIS:  Rapid atrial fibrillation ([place]) [I48.91]  DISCHARGE DIAGNOSIS:  a fib with RVR right pleural effusion status post thoracentesis lung cancer undergoing radiation therapy acute diastolic congestive heart failure SECONDARY DIAGNOSIS:   Past Medical History:  Diagnosis Date  . Breast cancer ([place]) 08/19/2012   LT LUMPECTOMY, radiation tx.   Marland Kitchen COPD (chronic obstructive pulmonary disease) ([place])   . Hailey-hailey disease   . Hyperlipemia   . Hypertension   . Lung cancer ([place]) 2014   RT LUNG, radiation tx  . Migraine   . Myocardial infarction ([place])   . Osteoporosis   . Personal history of radiation therapy 2013   left breast ca  . Personal history of radiation therapy 2014   lung ca  . Radiation 2013   FOR BREAST CA  . Radiation 2014   FOR LUNG CA  . Renal artery stenosis [facility])     HOSPITAL COURSE:  Kareema Keitt Huffstetleris a 86 y.o.femalewith medical history significant foratrial fibrillation on chronic anticoagulation therapy,history of lung cancer on chemo and radiation therapy.She was sent to the hospital to be directly admitted from the oncologist office due to worsening shortness of breath from her baseline with exertion and palpitations  Atrial fibrillation with rapid ventricular rate -Patient has a known history of A. fib and presents in a rapid ventricular rate -S/p Cardizem drip- -patient will follow-up with Dr. Nehemiah Massed as outpatient -TSH levels 2.0 normal -Increased Toprol-XL 50 mg p.o. twice daily --Continue Eliquis 2.5 mg p.o. twice daily for VTE prophylaxis -Cardiology consult with Dr. Ubaldo Glassing appreciated, recommended to discharge on Cardizem 120 mg  p.o. daily and metoprolol succinate 50 twice daily with Eliquis 2.5 and follow-up as an outpatient in 1 week  Shortness of breath, multifactorial, could be secondary to A. fib with RVR VS HFpEF acute on chronic diastolic, and  bilateral pleural effusion -Patient presents for worsening shortness of breath exertion associated with orthopnea. -Imaging shows bilateral pleural effusions -SLB improved after optimization of heart rate  -S/p Lasix 40 mg IV every 12h, 4/4 started Lasix 10 mg p.o. daily as per nephrologist -TTE 2 weeks ago done by cardiologist, LVEF 55%, moderate MR, moderate TR  -status post right-sided thoracentesis-- remove 300 mL of fluid. Saturations more than 90% on room air. Patient feels a lot better. -Post procedure chest x-ray-- no evidence of pneumothorax -urine output 4 L since admission. Sats more than 90% on room air  History of  Stage II a left lower lobe lung cancer -Follow-up with oncology as an outpatient-- patient undergoing chemotherapy and radiation therapy -Bilateral pleural effusion R > L -status post right-sided thoracentesis with removal of 300 mL fluid. Patient will follow-up with Dr. 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  Interventions:  Diet: Heart healthy DVT Prophylaxis: Therapeutic Anticoagulation with Eliquis  Advance goals of care discussion: DNR  Family Communication:  patient's daughter was present at bedside, at the time of interview.  The pt provided  permission to discuss medical plan with the family. Opportunity was given to ask question and all questions were answered satisfactorily.   Disposition:  Pt is from Home she will discharged home with home health CONSULTS OBTAINED:  Treatment Team:  Lloyd Huger, MD Teodoro Spray, MD  DRUG ALLERGIES:   Allergies  Allergen Reactions  . Bactrim [Sulfamethoxazole-Trimethoprim] Other (See Comments)    Reduced kidney function.  Per Dr. Holley Raring should not take  . Ibuprofen     Reduced kidney function. Per Dr. Holley Raring should not take  . Macrobid [employer] Macro] Other (See Comments)    Reduced Kidney function.  Should no longer take per Dr. Holley Raring  . Tape Rash    DISCHARGE MEDICATIONS:   Allergies as of 02/20/2020      Reactions   Bactrim [sulfamethoxazole-trimethoprim] Other (See Comments)   Reduced kidney function.  Per Dr. Holley Raring should not take   Ibuprofen    Reduced kidney function. Per Dr. Holley Raring should not take   Macrobid [nitrofurantoin Monohyd Macro] Other (See Comments)   Reduced Kidney function.  Should no longer take per Dr. Holley Raring   Tape Rash      Medication List    TAKE these medications   calcium-vitamin D 500-200 MG-UNIT Tabs tablet Commonly known as: OSCAL WITH D Take by mouth.   diltiazem 120 MG 24 hr capsule Commonly known as: CARDIZEM CD Take 1 capsule (120 mg total) by mouth daily. Start taking on: February 21, 2020   Eliquis 2.5 MG Tabs tablet Generic drug: apixaban Take 2.5 mg by mouth 2 (two) times daily.   feeding supplement (ENSURE ENLIVE) Liqd Take 237 mLs by mouth 2 (two) times daily between meals.   furosemide 20 MG tablet Commonly known as: LASIX Take 0.5 tablets (10 mg total) by mouth daily. Start taking on: February 21, 2020   metoprolol succinate 25 MG 24 hr tablet Commonly known as: TOPROL-XL Take 25 mg by mouth 2 (two) times daily.   ondansetron 8 MG tablet Commonly known as: Zofran Take 1 tablet (8 mg total) by  mouth 2 (two) times daily as needed for refractory nausea / vomiting.   ProAir HFA 108 (90 Base) MCG/ACT inhaler Generic drug: albuterol Inhale 2 puffs into the lungs every 6 (six) hours as needed for wheezing or shortness of breath.   prochlorperazine 10 MG tablet Commonly known as: COMPAZINE Take 1 tablet (10 mg total) by mouth every 6 (six) hours as needed (Nausea or vomiting).   simvastatin 20 MG tablet Commonly known as: ZOCOR Take 20 mg by mouth at bedtime.   traMADol 50 MG tablet Commonly known as: Ultram Take 1 tablet (50 mg total) by mouth every 12 (twelve) hours as needed.       If you experience worsening of your admission symptoms, develop shortness of breath, life threatening emergency, suicidal or homicidal thoughts you must seek medical attention immediately by calling 911 or calling your MD immediately  if symptoms less severe.  You Must read complete instructions/literature along with all the possible adverse reactions/side effects for all the Medicines you take and that have been prescribed to you. Take any new Medicines after you have completely understood and accept all the possible adverse reactions/side effects.   Please note  You were cared for by a hospitalist during your hospital stay. If you  have any questions about your discharge medications or the care you received while you were in the hospital after you are discharged, you can call the unit and asked to speak with the hospitalist on call if the hospitalist that took care of you is not available. Once you are discharged, your primary care physician will handle any further medical issues. Please note that NO REFILLS for any discharge medications will be authorized once you are discharged, as it is imperative that you return to your primary care physician (or establish a relationship with a primary care physician if you do not have one) for your aftercare needs so that they can reassess your need for medications  and monitor your lab values. Today   SUBJECTIVE   Feels a lot better. She wants to go home after the procedure today. Daughter at bedside. No other complaints.  VITAL SIGNS:  Blood pressure 121/84, pulse 67, temperature 98.2 F (36.8 C), temperature source Oral, resp. rate 16, height 5\' 3"  (1.6 m), weight 47.2 kg, SpO2 95 %.  I/O:    Intake/Output Summary (Last 24 hours) at 02/20/2020 1419 Last data filed at 02/20/2020 1330 Gross per 24 hour  Intake 830 ml  Output 500 ml  Net 330 ml    PHYSICAL EXAMINATION:  GENERAL:  86 y.o.-year-old patient lying in the bed with no acute distress. thinEYES: Pupils equal, round, reactive to light and accommodation. No scleral icterus.  HEENT: Head atraumatic, normocephalic. Oropharynx and nasopharynx clear.  NECK:  Supple, no jugular venous distention. No thyroid enlargement, no tenderness.  LUNGS: decreased breath sounds bilaterally bases, no wheezing, rales,rhonchi or crepitation. No use of accessory muscles of respiration.  CARDIOVASCULAR: S1, S2 normal. No murmurs, rubs, or gallops.  ABDOMEN: Soft, non-tender, non-distended. Bowel sounds present. No organomegaly or mass.  EXTREMITIES: No pedal edema, cyanosis, or clubbing.  NEUROLOGIC: Cranial nerves II through XII are intact. Muscle strength 5/5 in all extremities. Sensation intact. Gait not checked.  PSYCHIATRIC: The patient is alert and oriented x 3.  SKIN: No obvious rash, lesion, or ulcer.   DATA REVIEW:   CBC  Recent Labs  Lab 02/20/20 0405  WBC 5.2  HGB 11.0*  HCT 34.2*  PLT 176    Chemistries  Recent Labs  Lab 02/20/20 0405  NA 137  K 4.1  CL 97*  CO2 29  GLUCOSE 90  BUN 34*  CREATININE 1.26*  CALCIUM 8.4*  MG 2.3  AST 17  ALT 17  ALKPHOS 71  BILITOT 0.8    Microbiology Results   Recent Results (from the past 240 hour(s))  SARS CORONAVIRUS 2 (TAT 6-24 HRS) Nasopharyngeal Nasopharyngeal Swab     Status: None   Collection Time: 02/17/20  3:00 PM   Specimen:  Nasopharyngeal Swab  Result Value Ref Range Status   SARS Coronavirus 2 NEGATIVE NEGATIVE Final    Comment: (NOTE) SARS-CoV-2 target nucleic acids are NOT DETECTED. The SARS-CoV-2 RNA is generally detectable in upper and lower respiratory specimens during the acute phase of infection. Negative results do not preclude SARS-CoV-2 infection, do not rule out co-infections with other pathogens, and should not be used as the sole basis for treatment or other patient management decisions. Negative results must be combined with clinical observations, patient history, and epidemiological information. The expected result is Negative. Fact Sheet for Patients: SugarRoll.be Fact Sheet for Healthcare Providers: https://www.woods-mathews.com/ This test is not yet approved or cleared by the [place] FDA and  has been authorized for detection and/or diagnosis  of SARS-CoV-2 by FDA under an Emergency Use Authorization (EUA). This EUA will remain  in effect (meaning this test can be used) for the duration of the COVID-19 declaration under Section 56 4(b)(1) of the Act, 21 U.S.C. section 360bbb-3(b)(1), unless the authorization is terminated or revoked sooner. Performed at [facility], [place] [address], [place], [place] [address]     RADIOLOGY:  DG Chest Port 1 View  Result Date: 02/20/2020 CLINICAL DATA:  Post right-sided thoracentesis. History of left lower lobe lung cancer. EXAM: PORTABLE CHEST 1 VIEW COMPARISON:  11/28/2019; chest CT-02/17/2020 FINDINGS: Grossly unchanged cardiac silhouette and mediastinal contours with atherosclerotic plaque within the thoracic aorta. Interval reduction in persistent small partially loculated right-sided effusion post thoracentesis. No pneumothorax. Grossly unchanged right basilar opacities, atelectasis versus infiltrate. Grossly unchanged nodular somewhat spiculated opacity within the right upper lung adjacent to the  minor fissure. Grossly unchanged appearance of known left lower lung/retrocardiac opacity. The left lung remains otherwise well aerated. No definite left-sided pleural effusion. No evidence of edema. No acute osseous abnormalities. IMPRESSION: 1. Interval reduction in persistent small partially loculated right-sided effusion post thoracentesis. No pneumothorax. 2. Grossly unchanged right basilar opacities, atelectasis versus infiltrate. 3. Grossly unchanged pleuroparenchymal thickening about the right minor fissure and known left lower lung/retrocardiac opacity compatible with known history of lung cancer. Electronically Signed   By: Sandi Mariscal M.D.   On: 02/20/2020 11:38   US THORACENTESIS ASP PLEURAL SPACE W/IMG GUIDE  Result Date: 02/20/2020 INDICATION: Patient with history of atrial fibrillation, lung cancer currently receiving chemo radiation noted to have bilateral pleural effusions right greater than left. Request to IR for diagnostic and therapeutic thoracentesis. EXAM: ULTRASOUND GUIDED RIGHT THORACENTESIS MEDICATIONS: 10 mL 1% lidocaine COMPLICATIONS: None immediate. PROCEDURE: An ultrasound guided thoracentesis was thoroughly discussed with the patient and questions answered. The benefits, risks, alternatives and complications were also discussed. The patient understands and wishes to proceed with the procedure. Written consent was obtained. Ultrasound was performed to localize and mark an adequate pocket of fluid in the right chest. The area was then prepped and draped in the normal sterile fashion. 1% Lidocaine was used for local anesthesia. Under ultrasound guidance a 6 Fr Safe-T-Centesis catheter was introduced. Thoracentesis was performed. The catheter was removed and a dressing applied. FINDINGS: A total of approximately 300 mL of clear yellow fluid was removed. Samples were sent to the laboratory as requested by the clinical team. IMPRESSION: Successful ultrasound guided right thoracentesis  yielding 300 mL of pleural fluid. Read by Candiss Norse, PA-C Electronically Signed   By: Sandi Mariscal M.D.   On: 02/20/2020 11:08     CODE STATUS:     Code Status Orders  (From admission, onward)         Start     Ordered   02/17/20 1719  Do not attempt resuscitation (DNR)  Continuous    Question Answer Comment  In the event of cardiac or respiratory ARREST Do not call a "code blue"   In the event of cardiac or respiratory ARREST Do not perform Intubation, CPR, defibrillation or ACLS   In the event of cardiac or respiratory ARREST Use medication by any route, position, wound care, and other measures to relive pain and suffering. May use oxygen, suction and manual treatment of airway obstruction as needed for comfort.   Comments Discussed code status with patient and she wants to be a DNR      02/17/20 1718  Code Status History    Date Active Date Inactive Code Status Order ID Comments User Context   02/17/2020 1351 02/17/2020 1718 Full Code [id number]  Collier Bullock, MD Inpatient   Advance Care Planning Activity    Advance Directive Documentation     Most Recent Value  Type of Advance Directive  Healthcare Power of [place], Living will  Pre-existing out of facility DNR order (yellow form or pink MOST form)  --  "MOST" Form in Place?  --       TOTAL TIME TAKING CARE OF THIS PATIENT: *40* minutes.    Fritzi Mandes M.D  [facility]    CC: Primary care physician; Ezequiel Kayser, MD

## 2020-02-20 NOTE — Discharge Instructions (Signed)
Patient and family advised to resume previous radiation treatment as before at the cancer center. Follow-up Dr. Grayland Ormond on your scheduled appointment

## 2020-02-20 NOTE — Care Management Important Message (Signed)
Important Message  Patient Details  Name: [name] [name] [facility] MRN: [id number] Date of Birth: [birth year]   Medicare Important Message Given:  Yes     [name] 02/20/2020, 12:07 PM

## 2020-02-21 ENCOUNTER — Ambulatory Visit
Admission: RE | Admit: 2020-02-21 | Discharge: 2020-02-21 | Disposition: A | Discharge status: Home / Self Care | Payer: Medicare Other | Source: Ambulatory Visit | Attending: Radiation Oncology | Admitting: Radiation Oncology

## 2020-02-21 DIAGNOSIS — C3432 Malignant neoplasm of lower lobe, left bronchus or lung: Secondary | ICD-10-CM | POA: Diagnosis present

## 2020-02-21 LAB — PROTEIN, BODY FLUID (OTHER): Total Protein, Body Fluid Other: 2.2 g/dL

## 2020-02-21 LAB — CYTOLOGY - NON PAP

## 2020-02-22 ENCOUNTER — Ambulatory Visit
Admission: RE | Admit: 2020-02-22 | Discharge: 2020-02-22 | Disposition: A | Discharge status: Home / Self Care | Payer: Medicare Other | Source: Ambulatory Visit | Attending: Radiation Oncology | Admitting: Radiation Oncology

## 2020-02-22 DIAGNOSIS — C3432 Malignant neoplasm of lower lobe, left bronchus or lung: Secondary | ICD-10-CM | POA: Diagnosis not present

## 2020-02-23 ENCOUNTER — Ambulatory Visit
Admission: RE | Admit: 2020-02-23 | Discharge: 2020-02-23 | Disposition: A | Discharge status: Home / Self Care | Payer: Medicare Other | Source: Ambulatory Visit | Attending: Radiation Oncology | Admitting: Radiation Oncology

## 2020-02-23 DIAGNOSIS — C3432 Malignant neoplasm of lower lobe, left bronchus or lung: Secondary | ICD-10-CM | POA: Diagnosis not present

## 2020-02-24 ENCOUNTER — Ambulatory Visit
Admission: RE | Admit: 2020-02-24 | Discharge: 2020-02-24 | Disposition: A | Discharge status: Home / Self Care | Payer: Medicare Other | Source: Ambulatory Visit | Attending: Radiation Oncology | Admitting: Radiation Oncology

## 2020-02-24 ENCOUNTER — Inpatient Hospital Stay: Payer: Medicare Other

## 2020-02-24 ENCOUNTER — Other Ambulatory Visit: Payer: Self-pay

## 2020-02-24 ENCOUNTER — Inpatient Hospital Stay (HOSPITAL_BASED_OUTPATIENT_CLINIC_OR_DEPARTMENT_OTHER): Payer: Medicare Other | Admitting: Oncology

## 2020-02-24 ENCOUNTER — Encounter: Payer: Self-pay | Admitting: Oncology

## 2020-02-24 VITALS — BP 98/75 | HR 70 | Temp 96.7°F

## 2020-02-24 VITALS — BP 113/62 | HR 71 | Temp 96.5°F | Resp 20 | Wt 107.0 lb

## 2020-02-24 DIAGNOSIS — R918 Other nonspecific abnormal finding of lung field: Secondary | ICD-10-CM | POA: Diagnosis not present

## 2020-02-24 DIAGNOSIS — C3432 Malignant neoplasm of lower lobe, left bronchus or lung: Secondary | ICD-10-CM | POA: Diagnosis present

## 2020-02-24 DIAGNOSIS — Z5111 Encounter for antineoplastic chemotherapy: Secondary | ICD-10-CM | POA: Diagnosis present

## 2020-02-24 LAB — CBC WITH DIFFERENTIAL/PLATELET
Abs Immature Granulocytes: 0.03 10*3/uL (ref 0.00–0.07)
Basophils Absolute: 0.1 10*3/uL (ref 0.0–0.1)
Basophils Relative: 1 %
Eosinophils Absolute: 0.1 10*3/uL (ref 0.0–0.5)
Eosinophils Relative: 1 %
HCT: 40.7 % (ref 36.0–46.0)
Hemoglobin: 12.5 g/dL (ref 12.0–15.0)
Immature Granulocytes: 0 %
Lymphocytes Relative: 10 %
Lymphs Abs: 0.7 10*3/uL (ref 0.7–4.0)
MCH: 29.7 pg (ref 26.0–34.0)
MCHC: 30.7 g/dL (ref 30.0–36.0)
MCV: 96.7 fL (ref 80.0–100.0)
Monocytes Absolute: 0.7 10*3/uL (ref 0.1–1.0)
Monocytes Relative: 10 %
Neutro Abs: 5.8 10*3/uL (ref 1.7–7.7)
Neutrophils Relative %: 78 %
Platelets: 212 10*3/uL (ref 150–400)
RBC: 4.21 MIL/uL (ref 3.87–5.11)
RDW: 18.9 % — ABNORMAL HIGH (ref 11.5–15.5)
WBC: 7.3 10*3/uL (ref 4.0–10.5)
nRBC: 0 % (ref 0.0–0.2)

## 2020-02-24 LAB — COMPREHENSIVE METABOLIC PANEL
ALT: 19 U/L (ref 0–44)
AST: 24 U/L (ref 15–41)
Albumin: 3.6 g/dL (ref 3.5–5.0)
Alkaline Phosphatase: 94 U/L (ref 38–126)
Anion gap: 11 (ref 5–15)
BUN: 26 mg/dL — ABNORMAL HIGH (ref 8–23)
CO2: 28 mmol/L (ref 22–32)
Calcium: 8.8 mg/dL — ABNORMAL LOW (ref 8.9–10.3)
Chloride: 99 mmol/L (ref 98–111)
Creatinine, Ser: 1.21 mg/dL — ABNORMAL HIGH (ref 0.44–1.00)
GFR calc Af Amer: 46 mL/min — ABNORMAL LOW (ref >60)
GFR calc non Af Amer: 39 mL/min — ABNORMAL LOW (ref >60)
Glucose, Bld: 169 mg/dL — ABNORMAL HIGH (ref 70–99)
Potassium: 4.6 mmol/L (ref 3.5–5.1)
Sodium: 138 mmol/L (ref 135–145)
Total Bilirubin: 0.6 mg/dL (ref 0.3–1.2)
Total Protein: 7.1 g/dL (ref 6.5–8.1)

## 2020-02-24 LAB — BODY FLUID CULTURE: Culture: NO GROWTH

## 2020-02-24 MED ORDER — SODIUM CHLORIDE 0.9 % IV SOLN
97.0000 mg | Freq: Once | INTRAVENOUS | Status: AC
  Administered 2020-02-24: 12:00:00 100 mg via INTRAVENOUS
  Filled 2020-02-24: qty 10

## 2020-02-24 MED ORDER — PALONOSETRON HCL INJECTION 0.25 MG/5ML
0.2500 mg | Freq: Once | INTRAVENOUS | Status: AC
  Administered 2020-02-24: 0.25 mg via INTRAVENOUS
  Filled 2020-02-24: qty 5

## 2020-02-24 MED ORDER — FAMOTIDINE IN NACL 20-0.9 MG/50ML-% IV SOLN
20.0000 mg | Freq: Once | INTRAVENOUS | Status: AC
  Administered 2020-02-24: 10:00:00 20 mg via INTRAVENOUS
  Filled 2020-02-24: qty 50

## 2020-02-24 MED ORDER — DIPHENHYDRAMINE HCL 50 MG/ML IJ SOLN
25.0000 mg | Freq: Once | INTRAMUSCULAR | Status: AC
  Administered 2020-02-24: 10:00:00 25 mg via INTRAVENOUS
  Filled 2020-02-24: qty 1

## 2020-02-24 MED ORDER — SODIUM CHLORIDE 0.9 % IV SOLN
45.0000 mg/m2 | Freq: Once | INTRAVENOUS | Status: AC
  Administered 2020-02-24: 66 mg via INTRAVENOUS
  Filled 2020-02-24: qty 11

## 2020-02-24 MED ORDER — SODIUM CHLORIDE 0.9 % IV SOLN
10.0000 mg | Freq: Once | INTRAVENOUS | Status: AC
  Administered 2020-02-24: 10:00:00 10 mg via INTRAVENOUS
  Filled 2020-02-24: qty 10

## 2020-02-24 MED ORDER — SODIUM CHLORIDE 0.9 % IV SOLN
Freq: Once | INTRAVENOUS | Status: AC
  Filled 2020-02-24: qty 250

## 2020-02-24 NOTE — Progress Notes (Signed)
[place]  [phone] [phone]  ID: [name] [name] OB: [birth year]  MR#: [id number]  ZRA#:[id number]  Patient Care Team: Ezequiel Kayser, MD as PCP - General (Internal Medicine) Lloyd Huger, MD as Medical Oncologist (Medical Oncology)   CHIEF COMPLAINT: At least stage IIa left lower lobe lung malignancy.    INTERVAL HISTORY: Patient returns to clinic today for hospital follow-up and reconsideration of cycle 4 of weekly carboplatin and Taxol.  She feels significantly improved now that her atrial fibrillation is rate controlled.  She does not complain of weakness or fatigue today.  She denies any further dyspnea on exertion. She does not complain of pain today. She has no neurologic complaints.  She denies any recent fevers or illnesses.  She denies any chest pain, shortness of breath, cough, or hemoptysis.  She denies any nausea, vomiting, constipation, or diarrhea. She has no urinary complaints.  Patient offers no specific complaints today.  REVIEW OF SYSTEMS:   Review of Systems  Constitutional: Negative.  Negative for fever, malaise/fatigue and weight loss.  Respiratory: Negative.  Negative for cough and shortness of breath.   Cardiovascular: Negative.  Negative for chest pain and leg swelling.  Gastrointestinal: Negative.  Negative for abdominal pain, blood in stool, constipation, diarrhea, nausea and vomiting.  Genitourinary: Negative.  Negative for flank pain.  Musculoskeletal: Negative for back pain.  Skin: Negative.  Negative for rash.  Neurological: Negative.  Negative for tingling, sensory change, focal weakness, weakness and headaches.  Psychiatric/Behavioral: Negative.  The patient is not nervous/anxious and does not have insomnia.     As per HPI. Otherwise, a complete review of systems is negative.  PAST MEDICAL HISTORY: Past Medical History:  Diagnosis Date  . Breast cancer ([place]) 08/19/2012   LT LUMPECTOMY,  radiation tx.   Marland Kitchen COPD (chronic obstructive pulmonary disease) ([place])   . Hailey-hailey disease   . Hyperlipemia   . Hypertension   . Lung cancer ([place]) 2014   RT LUNG, radiation tx  . Migraine   . Myocardial infarction ([place])   . Osteoporosis   . Personal history of radiation therapy 2013   left breast ca  . Personal history of radiation therapy 2014   lung ca  . Radiation 2013   FOR BREAST CA  . Radiation 2014   FOR LUNG CA  . Renal artery stenosis ([place])     PAST SURGICAL HISTORY: Past Surgical History:  Procedure Laterality Date  . ABDOMINAL HYSTERECTOMY    . APPENDECTOMY    . BREAST BIOPSY Left 08/03/2012   invasive mammary carcinoma, u/s guided bx  . 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 cardiac stents    . PARATHYROIDECTOMY    . TONSILLECTOMY      FAMILY HISTORY Family History  Problem Relation Age of Onset  . Breast cancer Sister 28       ADVANCED DIRECTIVES:    HEALTH MAINTENANCE: Social History   Tobacco Use  . Smoking status: Current Every Day Smoker    Packs/day: 1.00    Years: 70.00    Pack years: 70.00  . Smokeless tobacco: Never Used  Substance Use Topics  . Alcohol use: No  . Drug use: No      Allergies  Allergen Reactions  . Bactrim [Sulfamethoxazole-Trimethoprim] Other (See Comments)    Reduced kidney function.  Per Dr. Holley Raring should not take  . Ibuprofen     Reduced kidney function. Per  Dr. Holley Raring should not take  . Macrobid [employer] Macro] Other (See Comments)    Reduced Kidney function.  Should no longer take per Dr. Holley Raring  . Tape Rash    Current Outpatient Medications  Medication Sig Dispense Refill  . albuterol (PROAIR HFA) 108 (90 Base) MCG/ACT inhaler Inhale 2 puffs into the lungs every 6 (six) hours as needed for wheezing or shortness of breath.     . calcium-vitamin D (OSCAL WITH D) 500-200 MG-UNIT TABS tablet Take by mouth.    . diltiazem  (CARDIZEM CD) 120 MG 24 hr capsule Take 1 capsule (120 mg total) by mouth daily. 30 capsule 0  . ELIQUIS 2.5 MG TABS tablet Take 2.5 mg by mouth 2 (two) times daily.    . feeding supplement, ENSURE ENLIVE, (ENSURE ENLIVE) LIQD Take 237 mLs by mouth 2 (two) times daily between meals. 237 mL 12  . furosemide (LASIX) 20 MG tablet Take 0.5 tablets (10 mg total) by mouth daily. 30 tablet 0  . metoprolol succinate (TOPROL-XL) 25 MG 24 hr tablet Take 25 mg by mouth 2 (two) times daily.     . ondansetron (ZOFRAN) 8 MG tablet Take 1 tablet (8 mg total) by mouth 2 (two) times daily as needed for refractory nausea / vomiting. 60 tablet 1  . prochlorperazine (COMPAZINE) 10 MG tablet Take 1 tablet (10 mg total) by mouth every 6 (six) hours as needed (Nausea or vomiting). 60 tablet 2  . simvastatin (ZOCOR) 20 MG tablet Take 20 mg by mouth at bedtime.    . traMADol (ULTRAM) 50 MG tablet Take 1 tablet (50 mg total) by mouth every 12 (twelve) hours as needed. 12 tablet 0   No current facility-administered medications for this visit.   Facility-Administered Medications Ordered in Other Visits  Medication Dose Route Frequency Provider Last Rate Last Admin  . 0.9 %  sodium chloride infusion   Intravenous Continuous Lloyd Huger, MD 100 mL/hr at 02/17/20 1115 New Bag at 02/17/20 1115    OBJECTIVE: Vitals:   02/24/20 0909  BP: 113/62  Pulse: 71  Resp: 20  Temp: (!) 96.5 F (35.8 C)  SpO2: 98%     Body mass index is 18.95 kg/m.    ECOG FS:0 - Asymptomatic  General: Well-developed, well-nourished, no acute distress. Eyes: Pink conjunctiva, anicteric sclera. HEENT: Normocephalic, moist mucous membranes. Lungs: No audible wheezing or coughing. Heart: Regular rate and rhythm. Abdomen: Soft, nontender, no obvious distention. Musculoskeletal: No edema, cyanosis, or clubbing. Neuro: Alert, answering all questions appropriately. Cranial nerves grossly intact. Skin: No rashes or petechiae noted. Psych:  Normal affect.  LAB RESULTS:  Lab Results  Component Value Date   NA 138 02/24/2020   K 4.6 02/24/2020   CL 99 02/24/2020   CO2 28 02/24/2020   GLUCOSE 169 (H) 02/24/2020   BUN 26 (H) 02/24/2020   CREATININE 1.21 (H) 02/24/2020   CALCIUM 8.8 (L) 02/24/2020   PROT 7.1 02/24/2020   ALBUMIN 3.6 02/24/2020   AST 24 02/24/2020   ALT 19 02/24/2020   ALKPHOS 94 02/24/2020   BILITOT 0.6 02/24/2020   GFRNONAA 39 (L) 02/24/2020   GFRAA 46 (L) 02/24/2020    Lab Results  Component Value Date   WBC 7.3 02/24/2020   NEUTROABS 5.8 02/24/2020   HGB 12.5 02/24/2020   HCT 40.7 02/24/2020   MCV 96.7 02/24/2020   PLT 212 02/24/2020     STUDIES: CT ANGIO CHEST PE W OR WO CONTRAST  Result Date:  02/17/2020 CLINICAL DATA:  Shortness of breath with exertion. Lung cancer. Clinical concern for pulmonary embolism. EXAM: CT ANGIOGRAPHY CHEST WITH CONTRAST TECHNIQUE: Multidetector CT imaging of the chest was performed using the standard protocol during bolus administration of intravenous contrast. Multiplanar CT image reconstructions and MIPs were obtained to evaluate the vascular anatomy. CONTRAST:  31m OMNIPAQUE IOHEXOL 350 MG/ML SOLN COMPARISON:  PET-CT 11/09/2019. Chest CTA 11/01/2019. FINDINGS: Cardiovascular: The pulmonary arteries are well opacified with contrast to the level of the subsegmental branches. There is no evidence of acute pulmonary embolism. There is extensive severe atherosclerosis of the aorta, great vessels and coronary arteries. There is limited opacification of the systemic arteries. Aneurysmal dilatation and irregular mural thrombus throughout the descending thoracic aorta are grossly stable. The aneurysm has a maximal diameter of 4.9 cm on sagittal image 64/8, similar to previous study. No apparent acute arterial abnormalities. The heart size is normal. There is no pericardial effusion. Mediastinum/Nodes: There are no enlarged mediastinal, hilar or axillary lymph nodes. The thyroid  gland, trachea and esophagus demonstrate no significant findings. Lungs/Pleura: There are new moderate right and small left dependent pleural effusions with associated increased compressive atelectasis at both lung bases. The medial left lower lobe mass adjacent to the descending aorta appears slightly smaller, measuring 3.7 x 2.4 cm on image 59/6. Previously 4.6 x 3.5 cm, and hypermetabolic on PET-CT. Stable bandlike fibrosis in the right upper lobe, likely related to previous therapy. Underlying moderate centrilobular emphysema. Upper abdomen: The visualized upper abdomen appears stable. There is stable low-density enlargement the left adrenal gland with central calcifications. There is diffuse aortic and branch vessel atherosclerosis. Musculoskeletal/Chest wall: There is no chest wall mass or suspicious osseous finding. Stable old right-sided rib fractures and chronic thoracic compression deformities. Review of the MIP images confirms the above findings. IMPRESSION: 1. No evidence of acute pulmonary embolism or other acute vascular findings. 2. New moderate right and small left dependent pleural effusions with associated increased compressive atelectasis at both lung bases. 3. The medial left lower lobe mass appears slightly smaller, and was hypermetabolic on previous PET-CT, presumably treated lung cancer. No evidence of metastatic disease. 4. Grossly stable descending thoracic aortic aneurysm measuring up to 4.9 cm in diameter. Consider continued annual CTA follow-up. 5. Aortic Atherosclerosis (ICD10-I70.0) and Emphysema (ICD10-J43.9). Electronically Signed   By: WRichardean SaleM.D.   On: 02/17/2020 16:58   DG Chest Port 1 View  Result Date: 02/20/2020 CLINICAL DATA:  Post right-sided thoracentesis. History of left lower lobe lung cancer. EXAM: PORTABLE CHEST 1 VIEW COMPARISON:  11/28/2019; chest CT-02/17/2020 FINDINGS: Grossly unchanged cardiac silhouette and mediastinal contours with atherosclerotic  plaque within the thoracic aorta. Interval reduction in persistent small partially loculated right-sided effusion post thoracentesis. No pneumothorax. Grossly unchanged right basilar opacities, atelectasis versus infiltrate. Grossly unchanged nodular somewhat spiculated opacity within the right upper lung adjacent to the minor fissure. Grossly unchanged appearance of known left lower lung/retrocardiac opacity. The left lung remains otherwise well aerated. No definite left-sided pleural effusion. No evidence of edema. No acute osseous abnormalities. IMPRESSION: 1. Interval reduction in persistent small partially loculated right-sided effusion post thoracentesis. No pneumothorax. 2. Grossly unchanged right basilar opacities, atelectasis versus infiltrate. 3. Grossly unchanged pleuroparenchymal thickening about the right minor fissure and known left lower lung/retrocardiac opacity compatible with known history of lung cancer. Electronically Signed   By: JSandi MariscalM.D.   On: 02/20/2020 11:38   [place]ORACENTESIS ASP PLEURAL SPACE W/IMG GUIDE  Result Date: 02/20/2020 INDICATION: Patient  with history of atrial fibrillation, lung cancer currently receiving chemo radiation noted to have bilateral pleural effusions right greater than left. Request to IR for diagnostic and therapeutic thoracentesis. EXAM: ULTRASOUND GUIDED RIGHT THORACENTESIS MEDICATIONS: 10 mL 1% lidocaine COMPLICATIONS: None immediate. PROCEDURE: An ultrasound guided thoracentesis was thoroughly discussed with the patient and questions answered. The benefits, risks, alternatives and complications were also discussed. The patient understands and wishes to proceed with the procedure. Written consent was obtained. Ultrasound was performed to localize and mark an adequate pocket of fluid in the right chest. The area was then prepped and draped in the normal sterile fashion. 1% Lidocaine was used for local anesthesia. Under ultrasound guidance a 6 Fr  Safe-T-Centesis catheter was introduced. Thoracentesis was performed. The catheter was removed and a dressing applied. FINDINGS: A total of approximately 300 mL of clear yellow fluid was removed. Samples were sent to the laboratory as requested by the clinical team. IMPRESSION: Successful ultrasound guided right thoracentesis yielding 300 mL of pleural fluid. Read by Candiss Norse, PA-C Electronically Signed   By: Sandi Mariscal M.D.   On: 02/20/2020 11:08    ASSESSMENT: At least stage IIa left lower lobe lung malignancy.   PLAN:    1.  At least stage IIa left lower lobe lung malignancy: Patient's initial malignancy was in the right upper lobe, therefore it is unclear if this is a second primary or a metastatic lesion.  PET scan results from November 09, 2019 reviewed independently confirming hypermetabolic lesion.  Patient has declined biopsy to confirm diagnosis.  Continue daily XRT completing treatment on March 08, 2020.  Proceed with cycle 4 of weekly carboplatinum and Taxol today.  Return to clinic in 1 week for further evaluation and consideration of cycle 5.   2.  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  PET scan results from Apr 02, 2018 revealed suspected recurrence in subcarinal lymph nodes.  She underwent XRT completing in August 2019.  PET scan results as above.  Unclear if this is a recurrence or second primary. 3. 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  Oncotype testing was not performed as this would not change the treatment plan.  Patient completed 5 years of letrozole in November 2019.  Her most recent mammogram on October 31, 2019 was reported BI-RADS 1.  Repeat in 1 year. 4.  Osteoporosis: Patient's most recent bone mineral density on October 31, 2019 reported T score  of -3.1 which is unchanged from 1 year prior. Patient last received Prolia on October 06, 2019.   5.  Renal insufficiency: Chronic and unchanged.  Patient's creatinine is 1.21 today. 6.  Nausea: Patient does not complain of this today.  Continue current oral antiemetics as prescribed.   7. Anemia: Resolved.   8.  Atrial fibrillation: Patient is now rate controlled and asymptomatic.  Continue follow-up with cardiology as indicated. 9.  Hyperkalemia: Resolved.    Patient expressed understanding and was in agreement with this plan. She also understands that She can call clinic at any time with any questions, concerns, or complaints.    Lloyd Huger, MD 02/24/20 9:34 AM

## 2020-02-24 NOTE — Progress Notes (Signed)
[name] [name]  [phone] [phone]  ID: [name] [name] OB: Apr 28, 1928  MR#: [id number]  TDH#:[id number]  Patient Care Team: Ezequiel Kayser, MD as PCP - General (Internal Medicine) Lloyd Huger, MD as Medical Oncologist (Medical Oncology)   CHIEF COMPLAINT: At least stage IIa left lower lobe lung malignancy.    INTERVAL HISTORY: Patient returns to clinic today for further evaluation and consideration of cycle 5 of weekly carboplatin and Taxol.  She currently feels well and is asymptomatic.  She denies any weakness or fatigue.  She denies any further dyspnea on exertion. She does not complain of pain today. She has no neurologic complaints.  She denies any recent fevers or illnesses.  She denies any chest pain, shortness of breath, cough, or hemoptysis.  She denies any nausea, vomiting, constipation, or diarrhea. She has no urinary complaints.  Patient offers no specific complaints today.  REVIEW OF SYSTEMS:   Review of Systems  Constitutional: Negative.  Negative for fever, malaise/fatigue and weight loss.  Respiratory: Negative.  Negative for cough and shortness of breath.   Cardiovascular: Negative.  Negative for chest pain and leg swelling.  Gastrointestinal: Negative.  Negative for abdominal pain, blood in stool, constipation, diarrhea, nausea and vomiting.  Genitourinary: Negative.  Negative for flank pain.  Musculoskeletal: Negative for back pain.  Skin: Negative.  Negative for rash.  Neurological: Negative.  Negative for tingling, sensory change, focal weakness, weakness and headaches.  Psychiatric/Behavioral: Negative.  The patient is not nervous/anxious and does not have insomnia.     As per HPI. Otherwise, a complete review of systems is negative.  PAST MEDICAL HISTORY: Past Medical History:  Diagnosis Date   Breast cancer ([place]) 08/19/2012   LT LUMPECTOMY, radiation tx.    COPD (chronic obstructive pulmonary disease)  ([place])    Hailey-hailey disease    Hyperlipemia    Hypertension    Lung cancer ([place]) 2014   RT LUNG, radiation tx   Migraine    Myocardial infarction [facility])    Osteoporosis    Personal history of radiation therapy 2013   left breast ca   Personal history of radiation therapy 2014   lung ca   Radiation 2013   FOR BREAST CA   Radiation 2014   FOR LUNG CA   Renal artery stenosis ([place])     PAST SURGICAL HISTORY: Past Surgical History:  Procedure Laterality Date   ABDOMINAL HYSTERECTOMY     APPENDECTOMY     BREAST BIOPSY Left 08/03/2012   invasive mammary carcinoma, u/s guided bx   BREAST LUMPECTOMY Left 2013   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  Per Dr. Holley Raring should not take   Ibuprofen     Reduced kidney function. Per Dr. Holley Raring should not take   Macrobid [  Nitrofurantoin Monohyd Macro] Other (See Comments)    Reduced Kidney function.  Should no longer take per Dr. Holley Raring   Tape Rash    Current Outpatient Medications  Medication Sig Dispense Refill   albuterol (PROAIR HFA) 108 (90 Base) MCG/ACT inhaler Inhale 2 puffs into the lungs every 6 (six) hours as needed for wheezing or shortness of breath.      calcium-vitamin D (OSCAL WITH D) 500-200 MG-UNIT TABS tablet Take by mouth.     diltiazem (CARDIZEM CD) 120 MG 24 hr capsule Take 1 capsule (120 mg total) by  mouth daily. 30 capsule 0   ELIQUIS 2.5 MG TABS tablet Take 2.5 mg by mouth 2 (two) times daily.     feeding supplement, ENSURE ENLIVE, (ENSURE ENLIVE) LIQD Take 237 mLs by mouth 2 (two) times daily between meals. 237 mL 12   furosemide (LASIX) 20 MG tablet Take 0.5 tablets (10 mg total) by mouth daily. 30 tablet 0   metoprolol succinate (TOPROL-XL) 25 MG 24 hr tablet Take 25 mg by mouth 2 (two) times daily.      ondansetron (ZOFRAN) 8 MG tablet Take 1 tablet (8 mg total) by mouth 2 (two) times daily as needed for refractory nausea / vomiting. 60 tablet 1   prochlorperazine (COMPAZINE) 10 MG tablet Take 1 tablet (10 mg total) by mouth every 6 (six) hours as needed (Nausea or vomiting). 60 tablet 2   simvastatin (ZOCOR) 20 MG tablet Take 20 mg by mouth at bedtime.     traMADol (ULTRAM) 50 MG tablet Take 1 tablet (50 mg total) by mouth every 12 (twelve) hours as needed. 12 tablet 0   No current facility-administered medications for this visit.   Facility-Administered Medications Ordered in Other Visits  Medication Dose Route Frequency Provider Last Rate Last Admin   0.9 %  sodium chloride infusion   Intravenous Continuous Lloyd Huger, MD 100 mL/hr at 02/17/20 1115 New Bag at 02/17/20 1115    OBJECTIVE: Vitals:   03/02/20 0901  BP: 121/72  Pulse: 89  Resp: 18  Temp: (!) 97.3 F (36.3 C)  SpO2: 100%     Body mass index is 18.41 kg/m.    ECOG FS:0 - Asymptomatic  General: Thin, no acute distress. Eyes: Pink conjunctiva, anicteric sclera. HEENT: Normocephalic, moist mucous membranes. Lungs: No audible wheezing or coughing. Heart: Regular rate and rhythm. Abdomen: Soft, nontender, no obvious distention. Musculoskeletal: No edema, cyanosis, or clubbing. Neuro: Alert, answering all questions appropriately. Cranial nerves grossly intact. Skin: No rashes or petechiae noted. Psych: Normal affect.   LAB RESULTS:  Lab Results  Component Value Date   NA 139 03/02/2020     K 4.8 03/02/2020   CL 101 03/02/2020   CO2 28 03/02/2020   GLUCOSE 102 (H) 03/02/2020   BUN 43 (H) 03/02/2020   CREATININE 1.26 (H) 03/02/2020   CALCIUM 9.4 03/02/2020   PROT 7.4 03/02/2020   ALBUMIN 3.8 03/02/2020   AST 23 03/02/2020   ALT 17 03/02/2020   ALKPHOS 85 03/02/2020   BILITOT 0.6 03/02/2020   GFRNONAA 37 (L) 03/02/2020   GFRAA 43 (L) 03/02/2020    Lab Results  Component Value Date   WBC 5.3 03/02/2020   NEUTROABS 3.9 03/02/2020   HGB 12.9 03/02/2020   HCT 41.1 03/02/2020   MCV 96.0 03/02/2020   PLT 235 03/02/2020     STUDIES: CT ANGIO CHEST PE W OR WO CONTRAST  Result Date: 02/17/2020 CLINICAL DATA:  Shortness of breath with  exertion. Lung cancer. Clinical concern for pulmonary embolism. EXAM: CT ANGIOGRAPHY CHEST WITH CONTRAST TECHNIQUE: Multidetector CT imaging of the chest was performed using the standard protocol during bolus administration of intravenous contrast. Multiplanar CT image reconstructions and MIPs were obtained to evaluate the vascular anatomy. CONTRAST:  91m OMNIPAQUE IOHEXOL 350 MG/ML SOLN COMPARISON:  PET-CT 11/09/2019. Chest CTA 11/01/2019. FINDINGS: Cardiovascular: The pulmonary arteries are well opacified with contrast to the level of the subsegmental branches. There is no evidence of acute pulmonary embolism. There is extensive severe atherosclerosis of the aorta, great vessels and coronary arteries. There is limited opacification of the systemic arteries. Aneurysmal dilatation and irregular mural thrombus throughout the descending thoracic aorta are grossly stable. The aneurysm has a maximal diameter of 4.9 cm on sagittal image 64/8, similar to previous study. No apparent acute arterial abnormalities. The heart size is normal. There is no pericardial effusion. Mediastinum/Nodes: There are no enlarged mediastinal, hilar or axillary lymph nodes. The thyroid gland, trachea and esophagus demonstrate no significant findings. Lungs/Pleura: There are  new moderate right and small left dependent pleural effusions with associated increased compressive atelectasis at both lung bases. The medial left lower lobe mass adjacent to the descending aorta appears slightly smaller, measuring 3.7 x 2.4 cm on image 59/6. Previously 4.6 x 3.5 cm, and hypermetabolic on PET-CT. Stable bandlike fibrosis in the right upper lobe, likely related to previous therapy. Underlying moderate centrilobular emphysema. Upper abdomen: The visualized upper abdomen appears stable. There is stable low-density enlargement the left adrenal gland with central calcifications. There is diffuse aortic and branch vessel atherosclerosis. Musculoskeletal/Chest wall: There is no chest wall mass or suspicious osseous finding. Stable old right-sided rib fractures and chronic thoracic compression deformities. Review of the MIP images confirms the above findings. IMPRESSION: 1. No evidence of acute pulmonary embolism or other acute vascular findings. 2. New moderate right and small left dependent pleural effusions with associated increased compressive atelectasis at both lung bases. 3. The medial left lower lobe mass appears slightly smaller, and was hypermetabolic on previous PET-CT, presumably treated lung cancer. No evidence of metastatic disease. 4. Grossly stable descending thoracic aortic aneurysm measuring up to 4.9 cm in diameter. Consider continued annual CTA follow-up. 5. Aortic Atherosclerosis (ICD10-I70.0) and Emphysema (ICD10-J43.9). Electronically Signed   By: WRichardean SaleM.D.   On: 02/17/2020 16:58   DG Chest Port 1 View  Result Date: 02/20/2020 CLINICAL DATA:  Post right-sided thoracentesis. History of left lower lobe lung cancer. EXAM: PORTABLE CHEST 1 VIEW COMPARISON:  11/28/2019; chest CT-02/17/2020 FINDINGS: Grossly unchanged cardiac silhouette and mediastinal contours with atherosclerotic plaque within the thoracic aorta. Interval reduction in persistent small partially loculated  right-sided effusion post thoracentesis. No pneumothorax. Grossly unchanged right basilar opacities, atelectasis versus infiltrate. Grossly unchanged nodular somewhat spiculated opacity within the right upper lung adjacent to the minor fissure. Grossly unchanged appearance of known left lower lung/retrocardiac opacity. The left lung remains otherwise well aerated. No definite left-sided pleural effusion. No evidence of edema. No acute osseous abnormalities. IMPRESSION: 1. Interval reduction in persistent small partially loculated right-sided effusion post thoracentesis. No pneumothorax. 2. Grossly unchanged right basilar opacities, atelectasis versus infiltrate. 3. Grossly unchanged pleuroparenchymal thickening about the right minor fissure and known left lower lung/retrocardiac opacity compatible with known history of lung cancer. Electronically Signed   By: JSandi MariscalM.D.   On: 02/20/2020 11:38   [place]OR[place]ENT[place]IS ASP PLEURAL SPACE W/IMG GUIDE  Result Date: 02/20/2020 INDICATION: Patient with history of atrial fibrillation, lung cancer currently  receiving chemo radiation noted to have bilateral pleural effusions right greater than left. Request to IR for diagnostic and therapeutic thoracentesis. EXAM: ULTRASOUND GUIDED RIGHT THORACENTESIS MEDICATIONS: 10 mL 1% lidocaine COMPLICATIONS: None immediate. PROCEDURE: An ultrasound guided thoracentesis was thoroughly discussed with the patient and questions answered. The benefits, risks, alternatives and complications were also discussed. The patient understands and wishes to proceed with the procedure. Written consent was obtained. Ultrasound was performed to localize and mark an adequate pocket of fluid in the right chest. The area was then prepped and draped in the normal sterile fashion. 1% Lidocaine was used for local anesthesia. Under ultrasound guidance a 6 Fr Safe-T-Centesis catheter was introduced. Thoracentesis was performed. The catheter was removed and a  dressing applied. FINDINGS: A total of approximately 300 mL of clear yellow fluid was removed. Samples were sent to the laboratory as requested by the clinical team. IMPRESSION: Successful ultrasound guided right thoracentesis yielding 300 mL of pleural fluid. Read by Candiss Norse, PA-C Electronically Signed   By: Sandi Mariscal M.D.   On: 02/20/2020 11:08    ASSESSMENT: At least stage IIa left lower lobe lung malignancy.   PLAN:    1.  At least stage IIa left lower lobe lung malignancy: Patient's initial malignancy was in the right upper lobe, therefore it is unclear if this is a second primary or a metastatic lesion.  PET scan results from November 09, 2019 reviewed independently confirming hypermetabolic lesion.  Patient has declined biopsy to confirm diagnosis.  Continue daily XRT completing treatment on March 08, 2020.  Proceed with cycle 5 of weekly carboplatin and Taxol today.  Return to clinic in 1 week for further evaluation and consideration of her sixth and final treatment.  Will reimage with PET scan approximately 2 months after completion of her treatments. 2.  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  PET scan results from Apr 02, 2018 revealed suspected recurrence in subcarinal lymph nodes.  She underwent XRT completing in August 2019.  PET scan results as above.  Unclear if this is a recurrence or second primary. 3. 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  Oncotype testing was not performed as this would not change the treatment plan.  Patient completed 5 years of letrozole in November 2019.  Her most recent mammogram on October 31, 2019 was reported BI-RADS 1.  Repeat in 1 year. 4.  Osteoporosis: Patient's most recent bone mineral density on October 31, 2019 reported T  score of -3.1 which is unchanged from 1 year prior. Patient last received Prolia on October 06, 2019.   5.  Renal insufficiency: Chronic and unchanged.  Patient's creatinine is 1.26. 6.  Nausea: Patient does not complain of this today.  Continue current oral antiemetics as prescribed.   7. Anemia: Resolved.   8.  Atrial fibrillation: Patient is now rate controlled and asymptomatic.  Continue follow-up with cardiology as indicated. 9.  Hyperkalemia: Resolved.    Patient expressed understanding and was in agreement with this plan. She also understands that She can call clinic at any time with any questions, concerns, or complaints.    Lloyd Huger, MD 03/02/20 9:27 AM

## 2020-02-26 ENCOUNTER — Ambulatory Visit: Payer: Medicare Other

## 2020-02-27 ENCOUNTER — Inpatient Hospital Stay: Payer: Medicare Other | Admitting: Hospice and Palliative Medicine

## 2020-02-27 ENCOUNTER — Telehealth: Payer: Self-pay | Admitting: Family

## 2020-02-27 ENCOUNTER — Inpatient Hospital Stay: Payer: Medicare Other

## 2020-02-27 ENCOUNTER — Ambulatory Visit: Payer: Medicare Other | Admitting: Cardiovascular Disease

## 2020-02-27 ENCOUNTER — Ambulatory Visit
Admission: RE | Admit: 2020-02-27 | Discharge: 2020-02-27 | Disposition: A | Discharge status: Home / Self Care | Payer: Medicare Other | Source: Ambulatory Visit | Attending: Radiation Oncology | Admitting: Radiation Oncology

## 2020-02-27 DIAGNOSIS — Z515 Encounter for palliative care: Secondary | ICD-10-CM

## 2020-02-27 DIAGNOSIS — C3432 Malignant neoplasm of lower lobe, left bronchus or lung: Secondary | ICD-10-CM | POA: Diagnosis not present

## 2020-02-27 NOTE — Progress Notes (Signed)
Nutrition Follow-up:  Patient with left lower lung mass and receiving concurrent chemotherapy and radiation.    Spoke with patient via phone for nutrition follow-up.  Patient reports appetite has increased and she is eating "everything in site."  Noted recent hospital visit for afib.  Patient reports did not eat well during hospital visit due to being on no salt diet.  Patient reports yesterday was able to eat bacon and slice of bread for breakfast.  Pimento cheese sandwich for lunch and sweets for supper (pudding).  Drinking 2 boost plus per day.  Denies any nutrition impact symptoms at this time.  Noted thoracentesis performed.      Medications: reviewed  Labs: reviewed  Anthropometrics:   Weight 107 lb per chart on 4/9 stable from 108 lb on 3/5   NUTRITION DIAGNOSIS: Inadequate oral intake improving per patient   INTERVENTION:  Patient to continue higher calorie oral nutrition supplement at least BID Reviewed high calorie, high protein foods. Reviewed good sources of protein in addition to meat as patient avoids eating due to trouble swallowing.  Patient has contact information    MONITORING, EVALUATION, GOAL: Patient will consume adequate calories and protein to prevent further weight loss   NEXT VISIT: phone f/u May 24  [name] [name] B. Zenia Resides, [place], [place] Registered Dietitian [phone] (pager)

## 2020-02-27 NOTE — Progress Notes (Signed)
I called and spoke with one of patient's daughters.  Patient is unavailable as she is currently at a another medical appointment.  Will reschedule.

## 2020-02-27 NOTE — Telephone Encounter (Signed)
LVM with daughter related to patients new patient CHF Clinic appointment on 4/15.     [name] [name], [place]

## 2020-02-28 ENCOUNTER — Ambulatory Visit
Admission: RE | Admit: 2020-02-28 | Discharge: 2020-02-28 | Disposition: A | Discharge status: Home / Self Care | Payer: Medicare Other | Source: Ambulatory Visit | Attending: Radiation Oncology | Admitting: Radiation Oncology

## 2020-02-28 DIAGNOSIS — C3432 Malignant neoplasm of lower lobe, left bronchus or lung: Secondary | ICD-10-CM | POA: Diagnosis not present

## 2020-02-29 ENCOUNTER — Ambulatory Visit
Admission: RE | Admit: 2020-02-29 | Discharge: 2020-02-29 | Disposition: A | Discharge status: Home / Self Care | Payer: Medicare Other | Source: Ambulatory Visit | Attending: Radiation Oncology | Admitting: Radiation Oncology

## 2020-02-29 DIAGNOSIS — C3432 Malignant neoplasm of lower lobe, left bronchus or lung: Secondary | ICD-10-CM | POA: Diagnosis not present

## 2020-03-01 ENCOUNTER — Encounter: Payer: Self-pay | Admitting: Oncology

## 2020-03-01 ENCOUNTER — Ambulatory Visit
Admission: RE | Admit: 2020-03-01 | Discharge: 2020-03-01 | Disposition: A | Discharge status: Home / Self Care | Payer: Medicare Other | Source: Ambulatory Visit | Attending: Radiation Oncology | Admitting: Radiation Oncology

## 2020-03-01 ENCOUNTER — Ambulatory Visit: Payer: Medicare Other | Admitting: Family

## 2020-03-01 DIAGNOSIS — C3432 Malignant neoplasm of lower lobe, left bronchus or lung: Secondary | ICD-10-CM | POA: Diagnosis not present

## 2020-03-02 ENCOUNTER — Ambulatory Visit
Admission: RE | Admit: 2020-03-02 | Discharge: 2020-03-02 | Disposition: A | Discharge status: Home / Self Care | Payer: Medicare Other | Source: Ambulatory Visit | Attending: Radiation Oncology | Admitting: Radiation Oncology

## 2020-03-02 ENCOUNTER — Inpatient Hospital Stay: Payer: Medicare Other

## 2020-03-02 ENCOUNTER — Other Ambulatory Visit: Payer: Self-pay

## 2020-03-02 ENCOUNTER — Inpatient Hospital Stay (HOSPITAL_BASED_OUTPATIENT_CLINIC_OR_DEPARTMENT_OTHER): Payer: Medicare Other | Admitting: Oncology

## 2020-03-02 ENCOUNTER — Encounter: Payer: Self-pay | Admitting: Oncology

## 2020-03-02 VITALS — BP 118/71 | HR 76 | Resp 18

## 2020-03-02 VITALS — BP 121/72 | HR 89 | Temp 97.3°F | Resp 18 | Wt 103.9 lb

## 2020-03-02 DIAGNOSIS — Z5111 Encounter for antineoplastic chemotherapy: Secondary | ICD-10-CM | POA: Diagnosis not present

## 2020-03-02 DIAGNOSIS — C3432 Malignant neoplasm of lower lobe, left bronchus or lung: Secondary | ICD-10-CM | POA: Diagnosis not present

## 2020-03-02 DIAGNOSIS — R918 Other nonspecific abnormal finding of lung field: Secondary | ICD-10-CM

## 2020-03-02 LAB — COMPREHENSIVE METABOLIC PANEL
ALT: 17 U/L (ref 0–44)
AST: 23 U/L (ref 15–41)
Albumin: 3.8 g/dL (ref 3.5–5.0)
Alkaline Phosphatase: 85 U/L (ref 38–126)
Anion gap: 10 (ref 5–15)
BUN: 43 mg/dL — ABNORMAL HIGH (ref 8–23)
CO2: 28 mmol/L (ref 22–32)
Calcium: 9.4 mg/dL (ref 8.9–10.3)
Chloride: 101 mmol/L (ref 98–111)
Creatinine, Ser: 1.26 mg/dL — ABNORMAL HIGH (ref 0.44–1.00)
GFR calc Af Amer: 43 mL/min — ABNORMAL LOW (ref >60)
GFR calc non Af Amer: 37 mL/min — ABNORMAL LOW (ref >60)
Glucose, Bld: 102 mg/dL — ABNORMAL HIGH (ref 70–99)
Potassium: 4.8 mmol/L (ref 3.5–5.1)
Sodium: 139 mmol/L (ref 135–145)
Total Bilirubin: 0.6 mg/dL (ref 0.3–1.2)
Total Protein: 7.4 g/dL (ref 6.5–8.1)

## 2020-03-02 LAB — CBC WITH DIFFERENTIAL/PLATELET
Abs Immature Granulocytes: 0.02 10*3/uL (ref 0.00–0.07)
Basophils Absolute: 0 10*3/uL (ref 0.0–0.1)
Basophils Relative: 1 %
Eosinophils Absolute: 0.2 10*3/uL (ref 0.0–0.5)
Eosinophils Relative: 4 %
HCT: 41.1 % (ref 36.0–46.0)
Hemoglobin: 12.9 g/dL (ref 12.0–15.0)
Immature Granulocytes: 0 %
Lymphocytes Relative: 14 %
Lymphs Abs: 0.7 10*3/uL (ref 0.7–4.0)
MCH: 30.1 pg (ref 26.0–34.0)
MCHC: 31.4 g/dL (ref 30.0–36.0)
MCV: 96 fL (ref 80.0–100.0)
Monocytes Absolute: 0.4 10*3/uL (ref 0.1–1.0)
Monocytes Relative: 7 %
Neutro Abs: 3.9 10*3/uL (ref 1.7–7.7)
Neutrophils Relative %: 74 %
Platelets: 235 10*3/uL (ref 150–400)
RBC: 4.28 MIL/uL (ref 3.87–5.11)
RDW: 18.8 % — ABNORMAL HIGH (ref 11.5–15.5)
WBC: 5.3 10*3/uL (ref 4.0–10.5)
nRBC: 0 % (ref 0.0–0.2)

## 2020-03-02 MED ORDER — FAMOTIDINE IN NACL 20-0.9 MG/50ML-% IV SOLN
20.0000 mg | Freq: Once | INTRAVENOUS | Status: AC
  Administered 2020-03-02: 20 mg via INTRAVENOUS
  Filled 2020-03-02: qty 50

## 2020-03-02 MED ORDER — SODIUM CHLORIDE 0.9 % IV SOLN
Freq: Once | INTRAVENOUS | Status: AC
  Filled 2020-03-02: qty 250

## 2020-03-02 MED ORDER — SODIUM CHLORIDE 0.9 % IV SOLN
10.0000 mg | Freq: Once | INTRAVENOUS | Status: AC
  Administered 2020-03-02: 10 mg via INTRAVENOUS
  Filled 2020-03-02: qty 10

## 2020-03-02 MED ORDER — SODIUM CHLORIDE 0.9 % IV SOLN
95.0000 mg | Freq: Once | INTRAVENOUS | Status: AC
  Administered 2020-03-02: 100 mg via INTRAVENOUS
  Filled 2020-03-02: qty 10

## 2020-03-02 MED ORDER — PALONOSETRON HCL INJECTION 0.25 MG/5ML
0.2500 mg | Freq: Once | INTRAVENOUS | Status: AC
  Administered 2020-03-02: 0.25 mg via INTRAVENOUS
  Filled 2020-03-02: qty 5

## 2020-03-02 MED ORDER — DIPHENHYDRAMINE HCL 50 MG/ML IJ SOLN
25.0000 mg | Freq: Once | INTRAMUSCULAR | Status: AC
  Administered 2020-03-02: 25 mg via INTRAVENOUS
  Filled 2020-03-02: qty 1

## 2020-03-02 MED ORDER — SODIUM CHLORIDE 0.9 % IV SOLN
45.0000 mg/m2 | Freq: Once | INTRAVENOUS | Status: AC
  Administered 2020-03-02: 66 mg via INTRAVENOUS
  Filled 2020-03-02: qty 11

## 2020-03-02 NOTE — Progress Notes (Signed)
Patient here today for follow up. She denies any pain or concerns.

## 2020-03-03 NOTE — Progress Notes (Signed)
[place]  [phone] [phone]  ID: [name] [name] OB: Oct 21, 1928  MR#: [id number]  ANV#:[id number]  Patient Care Team: Ezequiel Kayser, MD as PCP - General (Internal Medicine) Lloyd Huger, MD as Medical Oncologist (Medical Oncology)   CHIEF COMPLAINT: At least stage IIa left lower lobe lung malignancy.    INTERVAL HISTORY: Patient returns to clinic today for further evaluation and consideration of her sixth and final weekly treatment of carboplatinum and Taxol. She continues to feel well and remains asymptomatic.  She denies any weakness or fatigue.  She denies any further dyspnea on exertion. She does not complain of pain today. She has no neurologic complaints.  She denies any recent fevers or illnesses.  She denies any chest pain, shortness of breath, cough, or hemoptysis.  She denies any nausea, vomiting, constipation, or diarrhea. She has no urinary complaints. Patient offers no specific complaints today.  REVIEW OF SYSTEMS:   Review of Systems  Constitutional: Negative.  Negative for fever, malaise/fatigue and weight loss.  Respiratory: Negative.  Negative for cough and shortness of breath.   Cardiovascular: Negative.  Negative for chest pain and leg swelling.  Gastrointestinal: Negative.  Negative for abdominal pain, blood in stool, constipation, diarrhea, nausea and vomiting.  Genitourinary: Negative.  Negative for flank pain.  Musculoskeletal: Negative for back pain.  Skin: Negative.  Negative for rash.  Neurological: Negative.  Negative for tingling, sensory change, focal weakness, weakness and headaches.  Psychiatric/Behavioral: Negative.  The patient is not nervous/anxious and does not have insomnia.     As per HPI. Otherwise, a complete review of systems is negative.  PAST MEDICAL HISTORY: Past Medical History:  Diagnosis Date  . Breast cancer ([place]) 08/19/2012   LT LUMPECTOMY, radiation tx.   Marland Kitchen COPD (chronic  obstructive pulmonary disease) ([place])   . Hailey-hailey disease   . Hyperlipemia   . Hypertension   . Lung cancer ([place]) 2014   RT LUNG, radiation tx  . Migraine   . Myocardial infarction ([place])   . Osteoporosis   . Personal history of radiation therapy 2013   left breast ca  . Personal history of radiation therapy 2014   lung ca  . Radiation 2013   FOR BREAST CA  . Radiation 2014   FOR LUNG CA  . Renal artery stenosis ([place])     PAST SURGICAL HISTORY: Past Surgical History:  Procedure Laterality Date  . ABDOMINAL HYSTERECTOMY    . APPENDECTOMY    . BREAST BIOPSY Left 08/03/2012   invasive mammary carcinoma, u/s guided bx  . 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 cardiac stents    . PARATHYROIDECTOMY    . TONSILLECTOMY      FAMILY HISTORY Family History  Problem Relation Age of Onset  . Breast cancer Sister 32       ADVANCED DIRECTIVES:    HEALTH MAINTENANCE: Social History   Tobacco Use  . Smoking status: Current Every Day Smoker    Packs/day: 1.00    Years: 70.00    Pack years: 70.00  . Smokeless tobacco: Never Used  Substance Use Topics  . Alcohol use: No  . Drug use: No      Allergies  Allergen Reactions  . Bactrim [Sulfamethoxazole-Trimethoprim] Other (See Comments)    Reduced kidney function.  Per Dr. Holley Raring should not take  . Ibuprofen     Reduced kidney function. Per Dr. Holley Raring should not take  .  Macrobid [employer] Macro] Other (See Comments)    Reduced Kidney function.  Should no longer take per Dr. Holley Raring  . Tape Rash    Current Outpatient Medications  Medication Sig Dispense Refill  . albuterol (PROAIR HFA) 108 (90 Base) MCG/ACT inhaler Inhale 2 puffs into the lungs every 6 (six) hours as needed for wheezing or shortness of breath.     . calcium-vitamin D (OSCAL WITH D) 500-200 MG-UNIT TABS tablet Take by mouth.    . dexamethasone (DECADRON) 4 MG tablet Take 1  tablet (4 mg total) by mouth daily. 30 tablet 0  . diltiazem (CARDIZEM CD) 120 MG 24 hr capsule Take 1 capsule (120 mg total) by mouth daily. 30 capsule 0  . ELIQUIS 2.5 MG TABS tablet Take 2.5 mg by mouth 2 (two) times daily.    . feeding supplement, ENSURE ENLIVE, (ENSURE ENLIVE) LIQD Take 237 mLs by mouth 2 (two) times daily between meals. 237 mL 12  . furosemide (LASIX) 20 MG tablet Take 0.5 tablets (10 mg total) by mouth daily. 30 tablet 0  . metoprolol succinate (TOPROL-XL) 25 MG 24 hr tablet Take 25 mg by mouth 2 (two) times daily.     . ondansetron (ZOFRAN) 8 MG tablet Take 1 tablet (8 mg total) by mouth 2 (two) times daily as needed for refractory nausea / vomiting. 60 tablet 1  . prochlorperazine (COMPAZINE) 10 MG tablet Take 1 tablet (10 mg total) by mouth every 6 (six) hours as needed (Nausea or vomiting). 60 tablet 2  . simvastatin (ZOCOR) 20 MG tablet Take 20 mg by mouth at bedtime.    . traMADol (ULTRAM) 50 MG tablet Take 1 tablet (50 mg total) by mouth every 12 (twelve) hours as needed. 12 tablet 0   No current facility-administered medications for this visit.   Facility-Administered Medications Ordered in Other Visits  Medication Dose Route Frequency Provider Last Rate Last Admin  . 0.9 %  sodium chloride infusion   Intravenous Continuous Lloyd Huger, MD 100 mL/hr at 02/17/20 1115 New Bag at 02/17/20 1115  . CARBOplatin (PARAPLATIN) 100 mg in sodium chloride 0.9 % 100 mL chemo infusion  100 mg Intravenous Once Lloyd Huger, MD      . diphenhydrAMINE (BENADRYL) injection 25 mg  25 mg Intravenous Once Lloyd Huger, MD      . famotidine (PEPCID) IVPB 20 mg premix  20 mg Intravenous Once Lloyd Huger, MD      . PACLitaxel (TAXOL) 66 mg in sodium chloride 0.9 % 150 mL chemo infusion (</= 60m/m2)  45 mg/m2 (Treatment Plan Recorded) Intravenous Once FLloyd Huger MD        OBJECTIVE: Vitals:   03/09/20 0855  BP: 107/65  Pulse: 86  Resp: 17    Temp: (!) 96.5 F (35.8 C)  SpO2: 98%     Body mass index is 18.74 kg/m.    ECOG FS:0 - Asymptomatic  General: Thin, no acute distress. Eyes: Pink conjunctiva, anicteric sclera. HEENT: Normocephalic, moist mucous membranes. Lungs: No audible wheezing or coughing. Heart: Regular rate and rhythm. Abdomen: Soft, nontender, no obvious distention. Musculoskeletal: No edema, cyanosis, or clubbing. Neuro: Alert, answering all questions appropriately. Cranial nerves grossly intact. Skin: No rashes or petechiae noted. Psych: Normal affect.   LAB RESULTS:  Lab Results  Component Value Date   NA 142 03/09/2020   K 5.1 03/09/2020   CL 103 03/09/2020   CO2 26 03/09/2020   GLUCOSE 131 (H) 03/09/2020  BUN 32 (H) 03/09/2020   CREATININE 1.08 (H) 03/09/2020   CALCIUM 9.9 03/09/2020   PROT 8.0 03/09/2020   ALBUMIN 4.3 03/09/2020   AST 28 03/09/2020   ALT 25 03/09/2020   ALKPHOS 104 03/09/2020   BILITOT 0.6 03/09/2020   GFRNONAA 45 (L) 03/09/2020   GFRAA 52 (L) 03/09/2020    Lab Results  Component Value Date   WBC 6.5 03/09/2020   NEUTROABS 6.0 03/09/2020   HGB 12.9 03/09/2020   HCT 40.1 03/09/2020   MCV 94.8 03/09/2020   PLT 278 03/09/2020     STUDIES: CT ANGIO CHEST PE W OR WO CONTRAST  Result Date: 02/17/2020 CLINICAL DATA:  Shortness of breath with exertion. Lung cancer. Clinical concern for pulmonary embolism. EXAM: CT ANGIOGRAPHY CHEST WITH CONTRAST TECHNIQUE: Multidetector CT imaging of the chest was performed using the standard protocol during bolus administration of intravenous contrast. Multiplanar CT image reconstructions and MIPs were obtained to evaluate the vascular anatomy. CONTRAST:  29m OMNIPAQUE IOHEXOL 350 MG/ML SOLN COMPARISON:  PET-CT 11/09/2019. Chest CTA 11/01/2019. FINDINGS: Cardiovascular: The pulmonary arteries are well opacified with contrast to the level of the subsegmental branches. There is no evidence of acute pulmonary embolism. There is extensive  severe atherosclerosis of the aorta, great vessels and coronary arteries. There is limited opacification of the systemic arteries. Aneurysmal dilatation and irregular mural thrombus throughout the descending thoracic aorta are grossly stable. The aneurysm has a maximal diameter of 4.9 cm on sagittal image 64/8, similar to previous study. No apparent acute arterial abnormalities. The heart size is normal. There is no pericardial effusion. Mediastinum/Nodes: There are no enlarged mediastinal, hilar or axillary lymph nodes. The thyroid gland, trachea and esophagus demonstrate no significant findings. Lungs/Pleura: There are new moderate right and small left dependent pleural effusions with associated increased compressive atelectasis at both lung bases. The medial left lower lobe mass adjacent to the descending aorta appears slightly smaller, measuring 3.7 x 2.4 cm on image 59/6. Previously 4.6 x 3.5 cm, and hypermetabolic on PET-CT. Stable bandlike fibrosis in the right upper lobe, likely related to previous therapy. Underlying moderate centrilobular emphysema. Upper abdomen: The visualized upper abdomen appears stable. There is stable low-density enlargement the left adrenal gland with central calcifications. There is diffuse aortic and branch vessel atherosclerosis. Musculoskeletal/Chest wall: There is no chest wall mass or suspicious osseous finding. Stable old right-sided rib fractures and chronic thoracic compression deformities. Review of the MIP images confirms the above findings. IMPRESSION: 1. No evidence of acute pulmonary embolism or other acute vascular findings. 2. New moderate right and small left dependent pleural effusions with associated increased compressive atelectasis at both lung bases. 3. The medial left lower lobe mass appears slightly smaller, and was hypermetabolic on previous PET-CT, presumably treated lung cancer. No evidence of metastatic disease. 4. 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 Consider continued annual CTA follow-up. 5. Aortic Atherosclerosis (ICD10-I70.0) and Emphysema (ICD10-J43.9). Electronically Signed   By: WRichardean SaleM.D.   On: 02/17/2020 16:58   DG Chest Port 1 View  Result Date: 02/20/2020 CLINICAL DATA:  Post right-sided thoracentesis. History of left lower lobe lung cancer. EXAM: PORTABLE CHEST 1 VIEW COMPARISON:  11/28/2019; chest CT-02/17/2020 FINDINGS: Grossly unchanged cardiac silhouette and mediastinal contours with atherosclerotic plaque within the thoracic aorta. Interval reduction in persistent small partially loculated right-sided effusion post thoracentesis. No pneumothorax. Grossly unchanged right basilar opacities, atelectasis versus infiltrate. Grossly unchanged nodular somewhat spiculated opacity within the right  upper lung adjacent to the minor fissure. Grossly unchanged appearance of known left lower lung/retrocardiac opacity. The left lung remains otherwise well aerated. No definite left-sided pleural effusion. No evidence of edema. No acute osseous abnormalities. IMPRESSION: 1. Interval reduction in persistent small partially loculated right-sided effusion post thoracentesis. No pneumothorax. 2. Grossly unchanged right basilar opacities, atelectasis versus infiltrate. 3. Grossly unchanged pleuroparenchymal thickening about the right minor fissure and known left lower lung/retrocardiac opacity compatible with known history of lung cancer. Electronically Signed   By: Sandi Mariscal M.D.   On: 02/20/2020 11:38   US THORACENTESIS ASP PLEURAL SPACE W/IMG GUIDE  Result Date: 02/20/2020 INDICATION: Patient with history of atrial fibrillation, lung cancer currently receiving chemo radiation noted to have bilateral pleural effusions right greater than left. Request to IR for diagnostic and therapeutic thoracentesis. EXAM: ULTRASOUND GUIDED RIGHT THORACENTESIS MEDICATIONS: 10 mL 1% lidocaine COMPLICATIONS: None  immediate. PROCEDURE: An ultrasound guided thoracentesis was thoroughly discussed with the patient and questions answered. The benefits, risks, alternatives and complications were also discussed. The patient understands and wishes to proceed with the procedure. Written consent was obtained. Ultrasound was performed to localize and mark an adequate pocket of fluid in the right chest. The area was then prepped and draped in the normal sterile fashion. 1% Lidocaine was used for local anesthesia. Under ultrasound guidance a 6 Fr Safe-T-Centesis catheter was introduced. Thoracentesis was performed. The catheter was removed and a dressing applied. FINDINGS: A total of approximately 300 mL of clear yellow fluid was removed. Samples were sent to the laboratory as requested by the clinical team. IMPRESSION: Successful ultrasound guided right thoracentesis yielding 300 mL of pleural fluid. Read by Candiss Norse, PA-C Electronically Signed   By: Sandi Mariscal M.D.   On: 02/20/2020 11:08    ASSESSMENT: At least stage IIa left lower lobe lung malignancy.   PLAN:    1.  At least stage IIa left lower lobe lung malignancy: Patient's initial malignancy was in the right upper lobe, therefore it is unclear if this is a second primary or a metastatic lesion.  PET scan results from November 09, 2019 reviewed independently confirming hypermetabolic lesion.  Patient has declined biopsy to confirm diagnosis. Patient completed XRT yesterday, March 08, 2020. Proceed with her sixth and final treatment of weekly carboplatinum and Taxol today. No further intervention is needed. Return to clinic in 2 months with repeat imaging with PET scan and further evaluation.  2.  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  PET scan results from Apr 02, 2018 revealed suspected recurrence in subcarinal lymph nodes.  She underwent XRT completing in August 2019.  PET scan results as above.   Unclear if this is a recurrence or second primary. 3. 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  Oncotype testing was not performed as this would not change the treatment plan.  Patient completed 5 years of letrozole in November 2019.  Her most recent mammogram on October 31, 2019 was reported BI-RADS 1.  Repeat in 1 year. 4.  Osteoporosis: Patient's most recent bone mineral density on October 31, 2019 reported T score of -3.1 which is unchanged from 1 year prior. Patient last received Prolia on October 06, 2019. Patient will receive Prolia with her next clinic visit. 5.  Renal insufficiency: Essentially resolved,  monitor. 6.  Nausea: Patient does not complain of this today.  Continue current oral antiemetics as prescribed.   7. Anemia: Resolved.   8.  Atrial fibrillation: Patient is now rate controlled and asymptomatic.  Continue follow-up with cardiology as indicated. 9.  Hyperkalemia: Resolved.    Patient expressed understanding and was in agreement with this plan. She also understands that She can call clinic at any time with any questions, concerns, or complaints.    Lloyd Huger, MD 03/09/20 10:30 AM

## 2020-03-05 ENCOUNTER — Ambulatory Visit
Admission: RE | Admit: 2020-03-05 | Discharge: 2020-03-05 | Disposition: A | Discharge status: Home / Self Care | Payer: Medicare Other | Source: Ambulatory Visit | Attending: Radiation Oncology | Admitting: Radiation Oncology

## 2020-03-05 DIAGNOSIS — C3432 Malignant neoplasm of lower lobe, left bronchus or lung: Secondary | ICD-10-CM | POA: Diagnosis not present

## 2020-03-06 ENCOUNTER — Ambulatory Visit
Admission: RE | Admit: 2020-03-06 | Discharge: 2020-03-06 | Disposition: A | Discharge status: Home / Self Care | Payer: Medicare Other | Source: Ambulatory Visit | Attending: Radiation Oncology | Admitting: Radiation Oncology

## 2020-03-06 ENCOUNTER — Ambulatory Visit: Payer: Medicare Other

## 2020-03-06 ENCOUNTER — Other Ambulatory Visit: Payer: Self-pay | Admitting: Emergency Medicine

## 2020-03-06 DIAGNOSIS — C3432 Malignant neoplasm of lower lobe, left bronchus or lung: Secondary | ICD-10-CM | POA: Diagnosis not present

## 2020-03-06 MED ORDER — DEXAMETHASONE 4 MG PO TABS: 4.0000 mg | ORAL_TABLET | Freq: Every day | ORAL | 0 refills | Status: DC

## 2020-03-07 ENCOUNTER — Ambulatory Visit: Payer: Medicare Other

## 2020-03-07 ENCOUNTER — Ambulatory Visit
Admission: RE | Admit: 2020-03-07 | Discharge: 2020-03-07 | Disposition: A | Discharge status: Home / Self Care | Payer: Medicare Other | Source: Ambulatory Visit | Attending: Radiation Oncology | Admitting: Radiation Oncology

## 2020-03-07 DIAGNOSIS — C3432 Malignant neoplasm of lower lobe, left bronchus or lung: Secondary | ICD-10-CM | POA: Diagnosis not present

## 2020-03-08 ENCOUNTER — Ambulatory Visit
Admission: RE | Admit: 2020-03-08 | Discharge: 2020-03-08 | Disposition: A | Discharge status: Home / Self Care | Payer: Medicare Other | Source: Ambulatory Visit | Attending: Radiation Oncology | Admitting: Radiation Oncology

## 2020-03-08 DIAGNOSIS — C3432 Malignant neoplasm of lower lobe, left bronchus or lung: Secondary | ICD-10-CM | POA: Diagnosis not present

## 2020-03-09 ENCOUNTER — Encounter: Payer: Self-pay | Admitting: Oncology

## 2020-03-09 ENCOUNTER — Inpatient Hospital Stay: Payer: Medicare Other

## 2020-03-09 ENCOUNTER — Other Ambulatory Visit: Payer: Self-pay

## 2020-03-09 ENCOUNTER — Inpatient Hospital Stay (HOSPITAL_BASED_OUTPATIENT_CLINIC_OR_DEPARTMENT_OTHER): Payer: Medicare Other | Admitting: Oncology

## 2020-03-09 VITALS — BP 107/65 | HR 86 | Temp 96.5°F | Resp 17 | Wt 105.8 lb

## 2020-03-09 DIAGNOSIS — R918 Other nonspecific abnormal finding of lung field: Secondary | ICD-10-CM | POA: Diagnosis not present

## 2020-03-09 DIAGNOSIS — Z5111 Encounter for antineoplastic chemotherapy: Secondary | ICD-10-CM | POA: Diagnosis not present

## 2020-03-09 LAB — COMPREHENSIVE METABOLIC PANEL
ALT: 25 U/L (ref 0–44)
AST: 28 U/L (ref 15–41)
Albumin: 4.3 g/dL (ref 3.5–5.0)
Alkaline Phosphatase: 104 U/L (ref 38–126)
Anion gap: 13 (ref 5–15)
BUN: 32 mg/dL — ABNORMAL HIGH (ref 8–23)
CO2: 26 mmol/L (ref 22–32)
Calcium: 9.9 mg/dL (ref 8.9–10.3)
Chloride: 103 mmol/L (ref 98–111)
Creatinine, Ser: 1.08 mg/dL — ABNORMAL HIGH (ref 0.44–1.00)
GFR calc Af Amer: 52 mL/min — ABNORMAL LOW (ref >60)
GFR calc non Af Amer: 45 mL/min — ABNORMAL LOW (ref >60)
Glucose, Bld: 131 mg/dL — ABNORMAL HIGH (ref 70–99)
Potassium: 5.1 mmol/L (ref 3.5–5.1)
Sodium: 142 mmol/L (ref 135–145)
Total Bilirubin: 0.6 mg/dL (ref 0.3–1.2)
Total Protein: 8 g/dL (ref 6.5–8.1)

## 2020-03-09 LAB — CBC WITH DIFFERENTIAL/PLATELET
Abs Immature Granulocytes: 0.06 10*3/uL (ref 0.00–0.07)
Basophils Absolute: 0 10*3/uL (ref 0.0–0.1)
Basophils Relative: 0 %
Eosinophils Absolute: 0 10*3/uL (ref 0.0–0.5)
Eosinophils Relative: 0 %
HCT: 40.1 % (ref 36.0–46.0)
Hemoglobin: 12.9 g/dL (ref 12.0–15.0)
Immature Granulocytes: 1 %
Lymphocytes Relative: 5 %
Lymphs Abs: 0.3 10*3/uL — ABNORMAL LOW (ref 0.7–4.0)
MCH: 30.5 pg (ref 26.0–34.0)
MCHC: 32.2 g/dL (ref 30.0–36.0)
MCV: 94.8 fL (ref 80.0–100.0)
Monocytes Absolute: 0.1 10*3/uL (ref 0.1–1.0)
Monocytes Relative: 2 %
Neutro Abs: 6 10*3/uL (ref 1.7–7.7)
Neutrophils Relative %: 92 %
Platelets: 278 10*3/uL (ref 150–400)
RBC: 4.23 MIL/uL (ref 3.87–5.11)
RDW: 18.6 % — ABNORMAL HIGH (ref 11.5–15.5)
WBC: 6.5 10*3/uL (ref 4.0–10.5)
nRBC: 0 % (ref 0.0–0.2)

## 2020-03-09 MED ORDER — PALONOSETRON HCL INJECTION 0.25 MG/5ML
0.2500 mg | Freq: Once | INTRAVENOUS | Status: AC
  Administered 2020-03-09: 0.25 mg via INTRAVENOUS
  Filled 2020-03-09: qty 5

## 2020-03-09 MED ORDER — SODIUM CHLORIDE 0.9 % IV SOLN
Freq: Once | INTRAVENOUS | Status: AC
  Filled 2020-03-09: qty 250

## 2020-03-09 MED ORDER — DIPHENHYDRAMINE HCL 50 MG/ML IJ SOLN
25.0000 mg | Freq: Once | INTRAMUSCULAR | Status: AC
  Administered 2020-03-09: 25 mg via INTRAVENOUS
  Filled 2020-03-09: qty 1

## 2020-03-09 MED ORDER — SODIUM CHLORIDE 0.9 % IV SOLN
102.6000 mg | Freq: Once | INTRAVENOUS | Status: AC
  Administered 2020-03-09: 100 mg via INTRAVENOUS
  Filled 2020-03-09: qty 10

## 2020-03-09 MED ORDER — SODIUM CHLORIDE 0.9 % IV SOLN
10.0000 mg | Freq: Once | INTRAVENOUS | Status: AC
  Administered 2020-03-09: 10 mg via INTRAVENOUS
  Filled 2020-03-09: qty 10

## 2020-03-09 MED ORDER — FAMOTIDINE IN NACL 20-0.9 MG/50ML-% IV SOLN
20.0000 mg | Freq: Once | INTRAVENOUS | Status: AC
  Administered 2020-03-09: 20 mg via INTRAVENOUS
  Filled 2020-03-09: qty 50

## 2020-03-09 MED ORDER — SODIUM CHLORIDE 0.9 % IV SOLN
45.0000 mg/m2 | Freq: Once | INTRAVENOUS | Status: AC
  Administered 2020-03-09: 66 mg via INTRAVENOUS
  Filled 2020-03-09: qty 11

## 2020-03-09 NOTE — Progress Notes (Signed)
Pt here for follow up, reports feeling well. No complaints or concerns.

## 2020-03-13 ENCOUNTER — Ambulatory Visit: Payer: Medicare Other | Admitting: Radiation Oncology

## 2020-04-02 ENCOUNTER — Other Ambulatory Visit: Payer: Self-pay | Admitting: Oncology

## 2020-04-02 DIAGNOSIS — C3432 Malignant neoplasm of lower lobe, left bronchus or lung: Secondary | ICD-10-CM

## 2020-04-04 ENCOUNTER — Ambulatory Visit
Admission: RE | Admit: 2020-04-04 | Discharge: 2020-04-04 | Disposition: A | Discharge status: Home / Self Care | Payer: Medicare Other | Source: Ambulatory Visit | Attending: Nephrology | Admitting: Nephrology

## 2020-04-04 ENCOUNTER — Other Ambulatory Visit: Payer: Self-pay

## 2020-04-04 DIAGNOSIS — N2581 Secondary hyperparathyroidism of renal origin: Secondary | ICD-10-CM | POA: Diagnosis not present

## 2020-04-04 DIAGNOSIS — R809 Proteinuria, unspecified: Secondary | ICD-10-CM | POA: Diagnosis not present

## 2020-04-04 DIAGNOSIS — I701 Atherosclerosis of renal artery: Secondary | ICD-10-CM | POA: Insufficient documentation

## 2020-04-04 DIAGNOSIS — N1832 Chronic kidney disease, stage 3b: Secondary | ICD-10-CM | POA: Insufficient documentation

## 2020-04-04 DIAGNOSIS — I129 Hypertensive chronic kidney disease with stage 1 through stage 4 chronic kidney disease, or unspecified chronic kidney disease: Secondary | ICD-10-CM | POA: Insufficient documentation

## 2020-04-04 LAB — CBC WITH DIFFERENTIAL/PLATELET
Abs Immature Granulocytes: 0.09 10*3/uL — ABNORMAL HIGH (ref 0.00–0.07)
Basophils Absolute: 0 10*3/uL (ref 0.0–0.1)
Basophils Relative: 0 %
Eosinophils Absolute: 0 10*3/uL (ref 0.0–0.5)
Eosinophils Relative: 0 %
HCT: 35.3 % — ABNORMAL LOW (ref 36.0–46.0)
Hemoglobin: 11.5 g/dL — ABNORMAL LOW (ref 12.0–15.0)
Immature Granulocytes: 1 %
Lymphocytes Relative: 6 %
Lymphs Abs: 0.5 10*3/uL — ABNORMAL LOW (ref 0.7–4.0)
MCH: 31.7 pg (ref 26.0–34.0)
MCHC: 32.6 g/dL (ref 30.0–36.0)
MCV: 97.2 fL (ref 80.0–100.0)
Monocytes Absolute: 0.4 10*3/uL (ref 0.1–1.0)
Monocytes Relative: 4 %
Neutro Abs: 8.5 10*3/uL — ABNORMAL HIGH (ref 1.7–7.7)
Neutrophils Relative %: 89 %
Platelets: 150 10*3/uL (ref 150–400)
RBC: 3.63 MIL/uL — ABNORMAL LOW (ref 3.87–5.11)
RDW: 22 % — ABNORMAL HIGH (ref 11.5–15.5)
Smear Review: NORMAL
WBC: 9.5 10*3/uL (ref 4.0–10.5)
nRBC: 0 % (ref 0.0–0.2)

## 2020-04-04 LAB — RENAL FUNCTION PANEL
Albumin: 3.3 g/dL — ABNORMAL LOW (ref 3.5–5.0)
Anion gap: 8 (ref 5–15)
BUN: 47 mg/dL — ABNORMAL HIGH (ref 8–23)
CO2: 24 mmol/L (ref 22–32)
Calcium: 9.1 mg/dL (ref 8.9–10.3)
Chloride: 108 mmol/L (ref 98–111)
Creatinine, Ser: 1.41 mg/dL — ABNORMAL HIGH (ref 0.44–1.00)
GFR calc Af Amer: 38 mL/min — ABNORMAL LOW (ref >60)
GFR calc non Af Amer: 33 mL/min — ABNORMAL LOW (ref >60)
Glucose, Bld: 206 mg/dL — ABNORMAL HIGH (ref 70–99)
Phosphorus: 4.6 mg/dL (ref 2.5–4.6)
Potassium: 4.3 mmol/L (ref 3.5–5.1)
Sodium: 140 mmol/L (ref 135–145)

## 2020-04-04 MED ORDER — FUROSEMIDE 10 MG/ML IJ SOLN: 40.0000 mg | Freq: Once | INTRAMUSCULAR | Status: DC

## 2020-04-04 MED ORDER — FUROSEMIDE 10 MG/ML IJ SOLN
INTRAMUSCULAR | Status: AC
  Filled 2020-04-04: qty 4

## 2020-04-04 MED ORDER — FUROSEMIDE 10 MG/ML IJ SOLN
40.0000 mg | Freq: Once | INTRAMUSCULAR | Status: AC
  Administered 2020-04-04: 40 mg via INTRAVENOUS

## 2020-04-04 NOTE — Discharge Instructions (Signed)
Furosemide (LasixT) What is this medication used for?  This medication is used to prevent excessive fluid in the lungs.  It can also be used to treat generalized swelling and high blood pressure.  How should this medication be given?  Tamala Ser well before measuring the dose.  . Measure the correct dose using an oral syringe. . Place the syringe in the infant's mouth and give small amounts, allowing time for them to swallow after each squirt.  . Should be given with food to avoid stomach upset. . Give at the same time every day to avoid changes in blood pressure.  What should be done if a dose is missed? If a dose is missed, give it as soon as you remember. If it is close to the time for the next dose, simply skip the missed dose and restart the regular dosing schedule. It is important NOT to give double the recommended dose.   Are there any side effects?  . Changes in electrolytes that may require your baby's doctor to check labs. . Low blood pressure, especially if given with other medications that reduce blood pressure. . May cause diarrhea, vomiting, and constipation.  Other important information: . Store at room temperature. . Do not stop this medication without calling your baby's doctor.

## 2020-04-05 LAB — PTH, INTACT AND CALCIUM
Calcium, Total (PTH): 9 mg/dL (ref 8.7–10.3)
PTH: 25 pg/mL (ref 15–65)

## 2020-04-05 LAB — MICROALBUMIN / CREATININE URINE RATIO
Creatinine, Urine: 74 mg/dL
Microalb Creat Ratio: 91 mg/g creat — ABNORMAL HIGH (ref 0–29)
Microalb, Ur: 67.5 ug/mL — ABNORMAL HIGH

## 2020-04-06 ENCOUNTER — Emergency Department
Admission: EM | Admit: 2020-04-06 | Discharge: 2020-04-06 | Disposition: A | Discharge status: Home / Self Care | Payer: Medicare Other | Attending: Emergency Medicine | Admitting: Emergency Medicine

## 2020-04-06 ENCOUNTER — Ambulatory Visit: Admission: RE | Admit: 2020-04-06 | Payer: Medicare Other | Source: Ambulatory Visit

## 2020-04-06 ENCOUNTER — Other Ambulatory Visit: Payer: Self-pay

## 2020-04-06 ENCOUNTER — Emergency Department: Payer: Medicare Other

## 2020-04-06 DIAGNOSIS — I129 Hypertensive chronic kidney disease with stage 1 through stage 4 chronic kidney disease, or unspecified chronic kidney disease: Secondary | ICD-10-CM | POA: Diagnosis not present

## 2020-04-06 DIAGNOSIS — F17210 Nicotine dependence, cigarettes, uncomplicated: Secondary | ICD-10-CM | POA: Diagnosis not present

## 2020-04-06 DIAGNOSIS — N183 Chronic kidney disease, stage 3 unspecified: Secondary | ICD-10-CM | POA: Insufficient documentation

## 2020-04-06 DIAGNOSIS — Z79899 Other long term (current) drug therapy: Secondary | ICD-10-CM | POA: Insufficient documentation

## 2020-04-06 DIAGNOSIS — Z7901 Long term (current) use of anticoagulants: Secondary | ICD-10-CM | POA: Diagnosis not present

## 2020-04-06 DIAGNOSIS — I4891 Unspecified atrial fibrillation: Secondary | ICD-10-CM | POA: Insufficient documentation

## 2020-04-06 DIAGNOSIS — J449 Chronic obstructive pulmonary disease, unspecified: Secondary | ICD-10-CM | POA: Insufficient documentation

## 2020-04-06 DIAGNOSIS — R0602 Shortness of breath: Secondary | ICD-10-CM | POA: Diagnosis present

## 2020-04-06 DIAGNOSIS — I2510 Atherosclerotic heart disease of native coronary artery without angina pectoris: Secondary | ICD-10-CM | POA: Insufficient documentation

## 2020-04-06 LAB — CBC
HCT: 38.5 % (ref 36.0–46.0)
Hemoglobin: 12.2 g/dL (ref 12.0–15.0)
MCH: 31.2 pg (ref 26.0–34.0)
MCHC: 31.7 g/dL (ref 30.0–36.0)
MCV: 98.5 fL (ref 80.0–100.0)
Platelets: 180 10*3/uL (ref 150–400)
RBC: 3.91 MIL/uL (ref 3.87–5.11)
RDW: 22.5 % — ABNORMAL HIGH (ref 11.5–15.5)
WBC: 10.6 10*3/uL — ABNORMAL HIGH (ref 4.0–10.5)
nRBC: 0 % (ref 0.0–0.2)

## 2020-04-06 LAB — BASIC METABOLIC PANEL
Anion gap: 8 (ref 5–15)
BUN: 50 mg/dL — ABNORMAL HIGH (ref 8–23)
CO2: 28 mmol/L (ref 22–32)
Calcium: 9 mg/dL (ref 8.9–10.3)
Chloride: 107 mmol/L (ref 98–111)
Creatinine, Ser: 1.26 mg/dL — ABNORMAL HIGH (ref 0.44–1.00)
GFR calc Af Amer: 43 mL/min — ABNORMAL LOW (ref >60)
GFR calc non Af Amer: 37 mL/min — ABNORMAL LOW (ref >60)
Glucose, Bld: 111 mg/dL — ABNORMAL HIGH (ref 70–99)
Potassium: 4.1 mmol/L (ref 3.5–5.1)
Sodium: 143 mmol/L (ref 135–145)

## 2020-04-06 LAB — BRAIN NATRIURETIC PEPTIDE: B Natriuretic Peptide: 438.1 pg/mL — ABNORMAL HIGH (ref 0.0–100.0)

## 2020-04-06 LAB — TROPONIN I (HIGH SENSITIVITY)
Troponin I (High Sensitivity): 37 ng/L — ABNORMAL HIGH (ref <18)
Troponin I (High Sensitivity): 33 ng/L — ABNORMAL HIGH (ref <18)

## 2020-04-06 MED ORDER — DILTIAZEM HCL 25 MG/5ML IV SOLN
10.0000 mg | Freq: Once | INTRAVENOUS | Status: AC
  Administered 2020-04-06: 10 mg via INTRAVENOUS
  Filled 2020-04-06: qty 5

## 2020-04-06 MED ORDER — DILTIAZEM HCL ER COATED BEADS 120 MG PO CP24
120.0000 mg | ORAL_CAPSULE | Freq: Once | ORAL | Status: AC
  Administered 2020-04-06: 120 mg via ORAL
  Filled 2020-04-06: qty 1

## 2020-04-06 NOTE — ED Notes (Signed)
Dr Kinner at bedside. 

## 2020-04-06 NOTE — ED Triage Notes (Signed)
Reports SOB on exertion, given extra Lasix by PCP to assist in getting extra fluid off. Pt still SOB on exertion. Weight gain of 7-9lbs in last week. Pt alert and oriented X4, cooperative, RR even and unlabored, color WNL. Pt in NAD.

## 2020-04-06 NOTE — ED Provider Notes (Signed)
Patient's second troponin is improving.  Patient's heart rate did start to climb back up into the 130s.  I did give patient other dose of IV diltiazem which helped bring her heart rate down.  She was also given her oral diltiazem although I discussed with patient that will still be a little while I think before we see full effect.  She does feel better now that her heart rate has decreased.  At this point I do think is reasonable for patient be discharged home to follow-up as an outpatient.    Nance Pear, MD 04/06/20 [phone]

## 2020-04-06 NOTE — ED Provider Notes (Signed)
[facility] Emergency Department Provider Note   ____________________________________________    I have reviewed the triage vital signs and the nursing notes.   HISTORY  Chief Complaint Shortness of Breath and Tachycardia     HPI [name] [name] is a 86 y.o. female with a history of COPD, atrial fib/atrial flutter who presents with mild shortness of breath and weakness and fatigue.  She reports this is been ongoing for approximately 1 week.  Family is here with patient and notes that daughter went out of town that was responsible for giving medications and found out today that the patient has not been taking her Cardizem for probably 1 to 2 weeks.  Patient typically takes Cardizem 120 CD as well as metoprolol to control her atrial fibrillation.  She is anticoagulated.  She denies chest pain.  No fevers chills nausea or vomiting.  No diaphoresis.  No calf pain or swelling.  Review of records demonstrates the patient has had pleural effusions in the past that have required draining   Past Medical History:  Diagnosis Date  . Breast cancer ([place]) 08/19/2012   LT LUMPECTOMY, radiation tx.   Marland Kitchen COPD (chronic obstructive pulmonary disease) ([place])   . Hailey-hailey disease   . Hyperlipemia   . Hypertension   . Lung cancer ([place]) 2014   RT LUNG, radiation tx  . Migraine   . Myocardial infarction ([place])   . Osteoporosis   . Personal history of radiation therapy 2013   left breast ca  . Personal history of radiation therapy 2014   lung ca  . Radiation 2013   FOR BREAST CA  . Radiation 2014   FOR LUNG CA  . Renal artery stenosis [facility])     Patient Active Problem List   Diagnosis Date Noted  . S/P thoracentesis   . Malignant neoplasm of lower lobe of left lung ([place])   . Rapid atrial fibrillation ([place]) 02/17/2020  . Acute CHF (congestive heart failure) ([place]) 02/17/2020  . Emphysema lung ([place]) 02/17/2020  . Goals of care, counseling/discussion  01/13/2020  . Mass of lower lobe of left lung 12/22/2019  . 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 Acute kidney failure ([place]) 10/18/2019  . Proteinuria 10/18/2019  . Chronic left-sided thoracic back pain 09/08/2019  . Secondary hyperparathyroidism of renal origin ([place]) 01/26/2019  . Pain in toes of both feet 04/14/2018  . Bradycardia 08/14/2016  . Primary cancer of lower-inner quadrant of left female breast ([place]) 07/17/2016  . Chronic low back pain 06/18/2016  . Combined fat and carbohydrate induced hyperlipemia 01/25/2016  . Myocardial infarction ([place]) 01/25/2016  . Current tobacco use 01/25/2016  . Chronic use of opiate drug for therapeutic purpose 01/17/2016  . Hyperkalemia 12/21/2015  . Dizziness 07/25/2015  . Immunizations incomplete 03/04/2015  . MI (mitral incompetence) 12/20/2014  . Simple chronic bronchitis ([place]) 12/15/2014  . Vasomotor rhinitis 12/15/2014  . Coronary artery disease of native artery of native heart with stable angina pectoris ([place]) 12/12/2014  . TI (tricuspid incompetence) 12/12/2014  . Breathlessness on exertion 12/12/2014  . Stage 3 chronic kidney disease 06/15/2014  . Benign essential hypertension 06/15/2014  . Primary cancer of right upper lobe of lung ([place]) 06/15/2014  . Osteoporosis, post-menopausal 06/15/2014  . Peripheral vascular disease ([place]) 06/15/2014  . Renal artery stenosis ([place]) 06/15/2014  . History of left breast cancer 11/18/2011  . History of myocardial infarction 05/17/2001    Past Surgical History:  Procedure Laterality Date  . ABDOMINAL HYSTERECTOMY    .  APPENDECTOMY    . BREAST BIOPSY Left 08/03/2012   invasive mammary carcinoma, u/s guided bx  . 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 cardiac stents    . PARATHYROIDECTOMY    . TONSILLECTOMY      Prior to Admission medications   Medication Sig Start Date End Date Taking? Authorizing Provider  albuterol (PROAIR HFA)  108 (90 Base) MCG/ACT inhaler Inhale 2 puffs into the lungs every 6 (six) hours as needed for wheezing or shortness of breath.  08/15/15  Yes [provider]  calcium-vitamin D (OSCAL WITH D) 500-200 MG-UNIT TABS tablet Take 1 tablet by mouth daily.    Yes [provider]  dexamethasone (DECADRON) 4 MG tablet TAKE 1 TABLET (4 MG TOTAL) BY MOUTH DAILY. 04/02/20  Yes Lloyd Huger, MD  diltiazem (CARDIZEM CD) 120 MG 24 hr capsule Take 1 capsule (120 mg total) by mouth daily. 02/21/20  Yes Fritzi Mandes, MD  ELIQUIS 2.5 MG TABS tablet Take 2.5 mg by mouth 2 (two) times daily. 01/24/20  Yes [provider]  furosemide (LASIX) 20 MG tablet Take 0.5 tablets (10 mg total) by mouth daily. 02/21/20  Yes Fritzi Mandes, MD  metoprolol succinate (TOPROL-XL) 25 MG 24 hr tablet Take 25 mg by mouth 2 (two) times daily.  01/24/20  Yes [provider]  Multiple Vitamin (MULTIVITAMIN WITH MINERALS) TABS tablet Take 1 tablet by mouth daily.   Yes [provider]  Multiple Vitamins-Minerals (PRESERVISION AREDS PO) Take 1 tablet by mouth 2 (two) times daily.   Yes [provider]  ondansetron (ZOFRAN) 8 MG tablet Take 1 tablet (8 mg total) by mouth 2 (two) times daily as needed for refractory nausea / vomiting. 01/06/20  Yes Lloyd Huger, MD  simvastatin (ZOCOR) 20 MG tablet Take 20 mg by mouth at bedtime. 12/22/19  Yes [provider]  traMADol (ULTRAM) 50 MG tablet Take 1 tablet (50 mg total) by mouth every 12 (twelve) hours as needed. 11/28/19  Yes Sable Feil, PA-C  feeding supplement, ENSURE ENLIVE, (ENSURE ENLIVE) LIQD Take 237 mLs by mouth 2 (two) times daily between meals. 02/20/20   Fritzi Mandes, MD     Allergies Bactrim [sulfamethoxazole-trimethoprim], Ibuprofen, Macrobid [nitrofurantoin monohyd macro], and Tape  Family History  Problem Relation Age of Onset  . Breast cancer Sister 10    Social History Social History   Tobacco Use  . Smoking  status: Current Every Day Smoker    Packs/day: 1.00    Years: 70.00    Pack years: 70.00  . Smokeless tobacco: Never Used  Substance Use Topics  . Alcohol use: No  . Drug use: No    Review of Systems  Constitutional: No fever/chills Eyes: No visual changes.  ENT: No sore throat. Cardiovascular: Denies chest pain.  Palpitations Respiratory: As above Gastrointestinal: No abdominal pain.  No nausea, no vomiting.   Genitourinary: Negative for dysuria. Musculoskeletal: Negative for back pain. Skin: Negative for rash. Neurological: Negative for headaches    ____________________________________________   PHYSICAL EXAM:  VITAL SIGNS: ED Triage Vitals [04/06/20 1213]  Enc Vitals Group     BP 110/76     Pulse Rate (!) 151     Resp (!) 24     Temp 98.6 F (37 C)     Temp Source Oral     SpO2 97 %     Weight 51.2 kg (112 lb 12.8 oz)  Height 1.6 m (5\' 3" )     Head Circumference      Peak Flow      Pain Score 0     Pain Loc      Pain Edu?      Excl. in [place]?     Constitutional: Alert and oriented.  Eyes: Conjunctivae are normal.  Head: Atraumatic. Nose: No congestion/rhinnorhea. Mouth/Throat: Mucous membranes are moist.    Cardiovascular: Tachycardia, regular rhythm grossly normal heart sounds.  Good peripheral circulation. Respiratory: Normal respiratory effort.  No retractions. Lungs CTAB. Gastrointestinal: Soft and nontender. No distention.   Genitourinary: deferred Musculoskeletal: Minimal edema bilaterally warm and well perfused Neurologic:  Normal speech and language. No gross focal neurologic deficits are appreciated.  Skin:  Skin is warm, dry and intact. No rash noted. Psychiatric: Mood and affect are normal. Speech and behavior are normal.  ____________________________________________   LABS (all labs ordered are listed, but only abnormal results are displayed)  Labs Reviewed  BASIC METABOLIC PANEL - Abnormal; Notable for the following components:       Result Value   Glucose, Bld 111 (*)    BUN 50 (*)    Creatinine, Ser 1.26 (*)    GFR calc non Af Amer 37 (*)    GFR calc Af Amer 43 (*)    All other components within normal limits  CBC - Abnormal; Notable for the following components:   WBC 10.6 (*)    RDW 22.5 (*)    All other components within normal limits  BRAIN NATRIURETIC PEPTIDE - Abnormal; Notable for the following components:   B Natriuretic Peptide 438.1 (*)    All other components within normal limits  TROPONIN I (HIGH SENSITIVITY) - Abnormal; Notable for the following components:   Troponin I (High Sensitivity) 37 (*)    All other components within normal limits  TROPONIN I (HIGH SENSITIVITY)   ____________________________________________  EKG  ED ECG REPORT I, Lavonia Drafts, the attending physician, personally viewed and interpreted this ECG.  Date: 04/06/2020  Rhythm: Atrial flutter QRS Axis: normal Intervals: normal ST/T Wave abnormalities: Abnormal Narrative Interpretation: Atrial flutter with rapid ventricular response  ____________________________________________  RADIOLOGY  Chest x-ray reviewed by me, no new opacity or effusion ____________________________________________   PROCEDURES  Procedure(s) performed: No  Procedures   Critical Care performed: yes  CRITICAL CARE Performed by: Lavonia Drafts   Total critical care time: 30minutes  Critical care time was exclusive of separately billable procedures and treating other patients.  Critical care was necessary to treat or prevent imminent or life-threatening deterioration.  Critical care was time spent personally by me on the following activities: development of treatment plan with patient and/or surrogate as well as nursing, discussions with consultants, evaluation of patient's response to treatment, examination of patient, obtaining history from patient or surrogate, ordering and performing treatments and interventions, ordering and  review of laboratory studies, ordering and review of radiographic studies, pulse oximetry and re-evaluation of patient's condition.  ____________________________________________   INITIAL IMPRESSION / ASSESSMENT AND PLAN / ED COURSE  Pertinent labs & imaging results that were available during my care of the patient were reviewed by me and considered in my medical decision making (see chart for details).  Patient presents with complaints of shortness of breath especially with exertion, palpitations fatigue and weakness.  EKG demonstrates atrial flutter with rapid ventricular response, I strongly suspect this is the cause of her symptoms given that she has not been on her Cardizem in the last  1 to 2 weeks and symptoms of been going on during that time.  Differential includes infection, electrolyte abnormalities.  Patient has no chest pain to suggest ACS.  Lab work significant for mildly elevated BNP however chest x-ray is reassuring.  Mildly elevated troponin likely related to strain.  Patient treated with 10 mg of IV Cardizem with good response and felt much better after treatment.  Home Cardizem dose given, will check second troponin and ambulate the patient if continued heart rate control and feeling well appropriate for discharge with close follow-up with cardiology.    ____________________________________________   FINAL CLINICAL IMPRESSION(S) / ED DIAGNOSES  Final diagnoses:  Atrial fibrillation with RVR ([place])        Note:  This document was prepared using Dragon voice recognition software and may include unintentional dictation errors.   Lavonia Drafts, MD 04/06/20 [phone]

## 2020-04-06 NOTE — Discharge Instructions (Addendum)
Please seek medical attention for any high fevers, chest pain, shortness of breath, change in behavior, persistent vomiting, bloody stool or any other new or concerning symptoms.  

## 2020-04-06 NOTE — ED Notes (Addendum)
See triage note, pt reports that over the last few weeks has had worsening SHOB with exertion as well as gaining 7-8 pounds. Reports difficulty walking over the last few days which is abnormal. Denies CP.  RR even and unlabored  Hx afib.

## 2020-04-09 ENCOUNTER — Inpatient Hospital Stay: Payer: Medicare Other | Attending: Oncology

## 2020-04-09 NOTE — Progress Notes (Signed)
Nutrition  Patient scheduled for nutrition follow-up by phone today.  RD called but no answer.  Left message with call back number.   Richelle Glick B. Zenia Resides, [place], [place] Registered Dietitian [phone] (pager)

## 2020-04-12 ENCOUNTER — Ambulatory Visit: Payer: Medicare Other | Admitting: Oncology

## 2020-04-12 ENCOUNTER — Encounter: Payer: Self-pay | Admitting: Radiation Oncology

## 2020-04-12 ENCOUNTER — Other Ambulatory Visit: Payer: Self-pay

## 2020-04-12 ENCOUNTER — Ambulatory Visit: Payer: Medicare Other

## 2020-04-12 ENCOUNTER — Encounter: Payer: Medicare Other | Admitting: Hospice and Palliative Medicine

## 2020-04-12 ENCOUNTER — Ambulatory Visit
Admission: RE | Admit: 2020-04-12 | Discharge: 2020-04-12 | Disposition: A | Discharge status: Home / Self Care | Payer: Medicare Other | Source: Ambulatory Visit | Attending: Radiation Oncology | Admitting: Radiation Oncology

## 2020-04-12 ENCOUNTER — Other Ambulatory Visit: Payer: Medicare Other

## 2020-04-12 VITALS — BP 95/61 | HR 78 | Temp 96.0°F | Resp 16 | Wt 112.9 lb

## 2020-04-12 DIAGNOSIS — I4892 Unspecified atrial flutter: Secondary | ICD-10-CM | POA: Insufficient documentation

## 2020-04-12 DIAGNOSIS — C3431 Malignant neoplasm of lower lobe, right bronchus or lung: Secondary | ICD-10-CM | POA: Insufficient documentation

## 2020-04-12 DIAGNOSIS — Z853 Personal history of malignant neoplasm of breast: Secondary | ICD-10-CM | POA: Insufficient documentation

## 2020-04-12 DIAGNOSIS — Z923 Personal history of irradiation: Secondary | ICD-10-CM | POA: Insufficient documentation

## 2020-04-12 DIAGNOSIS — C7802 Secondary malignant neoplasm of left lung: Secondary | ICD-10-CM | POA: Insufficient documentation

## 2020-04-12 DIAGNOSIS — C3432 Malignant neoplasm of lower lobe, left bronchus or lung: Secondary | ICD-10-CM

## 2020-04-12 NOTE — Progress Notes (Signed)
Radiation Oncology Follow up Note  Name: [name] [name] [facility]   Date:   04/12/2020 MRN:  [id number] DOB: May 07, 1928    This 86 y.o. female presents to the clinic today for 1 month follow-up status post.  Radiation therapy to her left lower lobe for non-small cell lung cancer.  REFERRING PROVIDER: Ezequiel Kayser, MD  HPI: Patient is a 86 year old female has received bilateral radiation therapy to her breasts as well as SBRT to her right lower lobe for non-small cell lung cancer.  She also completed salvage radiation therapy to her chest after developing hypermetabolic activity in the subcarinal nodes back in August 2019.  She now had an enlarging mass in the left lower lobe which we treated with radiation therapy from which she is 1 month out she complains of poor pulmonary function with increasing shortness of breath fatigue.Marland Kitchen  She was recently seen in the emergency room for atrial for been atrial flutter.  She had not been taking her Cardizem.  COMPLICATIONS OF TREATMENT: none  FOLLOW UP COMPLIANCE: keeps appointments   PHYSICAL EXAM:  BP 95/61 (BP Location: Left Arm, Patient Position: Sitting, Cuff Size: Small)   Pulse 78   Temp (!) 96 F (35.6 C) (Tympanic)   Resp 16   Wt 112 lb 14.4 oz (51.2 kg)   BMI 20.00 kg/m  Frail-appearing elderly female in NAD well-developed well-nourished patient in NAD. HEENT reveals PERLA, EOMI, discs not visualized.  Oral cavity is clear. No oral mucosal lesions are identified. Neck is clear without evidence of cervical or supraclavicular adenopathy. Lungs are clear to A&P. Cardiac examination is essentially unremarkable with regular rate and rhythm without murmur rub or thrill. Abdomen is benign with no organomegaly or masses noted. Motor sensory and DTR levels are equal and symmetric in the upper and lower extremities. Cranial nerves II through XII are grossly intact. Proprioception is intact. No peripheral adenopathy or edema is identified. No motor or  sensory levels are noted. Crude visual fields are within normal range.  RADIOLOGY RESULTS: Chest x-rays from the emergency room reviewed  PLAN: At this time patient is doing fairly well.  I have asked her to follow-up with her cardiologist for her cardiac complaints.  She continues follow-up care with medical oncology.  Patient may benefit from referral to pulmonology to help tune up her lungs.  I have asked to see her back in 4 months for follow-up.  Patient and family know to call with any concerns.  I would like to take this opportunity to thank you for allowing me to participate in the care of your patient.Noreene Filbert, MD

## 2020-04-30 ENCOUNTER — Inpatient Hospital Stay: Payer: Medicare Other

## 2020-04-30 ENCOUNTER — Other Ambulatory Visit: Payer: Self-pay

## 2020-04-30 ENCOUNTER — Inpatient Hospital Stay (HOSPITAL_BASED_OUTPATIENT_CLINIC_OR_DEPARTMENT_OTHER): Payer: Medicare Other | Admitting: Hospice and Palliative Medicine

## 2020-04-30 ENCOUNTER — Telehealth: Payer: Self-pay | Admitting: *Deleted

## 2020-04-30 ENCOUNTER — Other Ambulatory Visit: Payer: Self-pay | Admitting: *Deleted

## 2020-04-30 ENCOUNTER — Inpatient Hospital Stay: Payer: Medicare Other | Attending: Oncology

## 2020-04-30 VITALS — BP 123/65 | HR 83 | Temp 97.7°F | Resp 16

## 2020-04-30 DIAGNOSIS — C50312 Malignant neoplasm of lower-inner quadrant of left female breast: Secondary | ICD-10-CM

## 2020-04-30 DIAGNOSIS — C3432 Malignant neoplasm of lower lobe, left bronchus or lung: Secondary | ICD-10-CM | POA: Insufficient documentation

## 2020-04-30 DIAGNOSIS — R918 Other nonspecific abnormal finding of lung field: Secondary | ICD-10-CM | POA: Insufficient documentation

## 2020-04-30 DIAGNOSIS — C3411 Malignant neoplasm of upper lobe, right bronchus or lung: Secondary | ICD-10-CM

## 2020-04-30 DIAGNOSIS — R531 Weakness: Secondary | ICD-10-CM

## 2020-04-30 DIAGNOSIS — F05 Delirium due to known physiological condition: Secondary | ICD-10-CM

## 2020-04-30 DIAGNOSIS — Z90710 Acquired absence of both cervix and uterus: Secondary | ICD-10-CM | POA: Insufficient documentation

## 2020-04-30 DIAGNOSIS — L89322 Pressure ulcer of left buttock, stage 2: Secondary | ICD-10-CM | POA: Diagnosis not present

## 2020-04-30 DIAGNOSIS — R410 Disorientation, unspecified: Secondary | ICD-10-CM | POA: Insufficient documentation

## 2020-04-30 DIAGNOSIS — Z853 Personal history of malignant neoplasm of breast: Secondary | ICD-10-CM | POA: Insufficient documentation

## 2020-04-30 DIAGNOSIS — E860 Dehydration: Secondary | ICD-10-CM

## 2020-04-30 DIAGNOSIS — Z789 Other specified health status: Secondary | ICD-10-CM

## 2020-04-30 DIAGNOSIS — Z636 Dependent relative needing care at home: Secondary | ICD-10-CM

## 2020-04-30 LAB — CBC WITH DIFFERENTIAL/PLATELET
Abs Immature Granulocytes: 0.14 10*3/uL — ABNORMAL HIGH (ref 0.00–0.07)
Basophils Absolute: 0 10*3/uL (ref 0.0–0.1)
Basophils Relative: 0 %
Eosinophils Absolute: 0 10*3/uL (ref 0.0–0.5)
Eosinophils Relative: 0 %
HCT: 42.1 % (ref 36.0–46.0)
Hemoglobin: 13.7 g/dL (ref 12.0–15.0)
Immature Granulocytes: 1 %
Lymphocytes Relative: 5 %
Lymphs Abs: 0.5 10*3/uL — ABNORMAL LOW (ref 0.7–4.0)
MCH: 32 pg (ref 26.0–34.0)
MCHC: 32.5 g/dL (ref 30.0–36.0)
MCV: 98.4 fL (ref 80.0–100.0)
Monocytes Absolute: 0.3 10*3/uL (ref 0.1–1.0)
Monocytes Relative: 3 %
Neutro Abs: 9.7 10*3/uL — ABNORMAL HIGH (ref 1.7–7.7)
Neutrophils Relative %: 91 %
Platelets: 126 10*3/uL — ABNORMAL LOW (ref 150–400)
RBC: 4.28 MIL/uL (ref 3.87–5.11)
RDW: 19.6 % — ABNORMAL HIGH (ref 11.5–15.5)
WBC: 10.8 10*3/uL — ABNORMAL HIGH (ref 4.0–10.5)
nRBC: 0 % (ref 0.0–0.2)

## 2020-04-30 LAB — COMPREHENSIVE METABOLIC PANEL
ALT: 34 U/L (ref 0–44)
AST: 22 U/L (ref 15–41)
Albumin: 3.3 g/dL — ABNORMAL LOW (ref 3.5–5.0)
Alkaline Phosphatase: 79 U/L (ref 38–126)
Anion gap: 11 (ref 5–15)
BUN: 72 mg/dL — ABNORMAL HIGH (ref 8–23)
CO2: 27 mmol/L (ref 22–32)
Calcium: 8.6 mg/dL — ABNORMAL LOW (ref 8.9–10.3)
Chloride: 105 mmol/L (ref 98–111)
Creatinine, Ser: 1.37 mg/dL — ABNORMAL HIGH (ref 0.44–1.00)
GFR calc Af Amer: 39 mL/min — ABNORMAL LOW (ref >60)
GFR calc non Af Amer: 34 mL/min — ABNORMAL LOW (ref >60)
Glucose, Bld: 119 mg/dL — ABNORMAL HIGH (ref 70–99)
Potassium: 4.3 mmol/L (ref 3.5–5.1)
Sodium: 143 mmol/L (ref 135–145)
Total Bilirubin: 0.7 mg/dL (ref 0.3–1.2)
Total Protein: 6.4 g/dL — ABNORMAL LOW (ref 6.5–8.1)

## 2020-04-30 LAB — URINALYSIS, COMPLETE (UACMP) WITH MICROSCOPIC
Bilirubin Urine: NEGATIVE
Glucose, UA: NEGATIVE mg/dL
Hgb urine dipstick: NEGATIVE
Ketones, ur: NEGATIVE mg/dL
Leukocytes,Ua: NEGATIVE
Nitrite: NEGATIVE
Protein, ur: NEGATIVE mg/dL
Specific Gravity, Urine: 1.018 (ref 1.005–1.030)
pH: 5 (ref 5.0–8.0)

## 2020-04-30 LAB — MAGNESIUM: Magnesium: 2.4 mg/dL (ref 1.7–2.4)

## 2020-04-30 MED ORDER — SODIUM CHLORIDE 0.9 % IV SOLN
Freq: Once | INTRAVENOUS | Status: AC
  Filled 2020-04-30: qty 250

## 2020-04-30 NOTE — Progress Notes (Addendum)
[facility]pt[facility] [place]  [phone] [phone]  Patient Care Team: Ezequiel Kayser, MD as PCP - General (Internal Medicine) Lloyd Huger, MD as Medical Oncologist (Medical Oncology)   Name of the patient: [name] [name]  [id number]  [birth year]   Date of visit: 04/30/20  Reason for Consult: [name] [name] [facility] is a 86 y.o. 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  Patient has declined biopsy to confirm recurrent lung cancer.  Stage was at least IIa. She is s/p XRT and carbo/Taxol. Plan was for PET scan on 05/10/20.   She was seen in the ER on 04/06/20 for shortness of breath and fatigue thought secondary to her atrial flutter. She was not taking her Cardizem as prescribed.   She presents to the [facility] today with progressive weakness, confusion, and a sacral sore. Daughter returned from vacation and found that patient had overall declined over the past 1 to 2 weeks.  Patient has had difficulty with ambulating and is no longer able to stand without assistance.  Oral intake has been poor.  Patient has had some urinary incontinence.  Denies any neurologic complaints. Denies recent fevers or illnesses. Denies any easy bleeding or bruising. Reports good appetite and denies weight loss. Denies chest pain. Denies any nausea, vomiting, constipation, or diarrhea. Denies urinary complaints. Patient offers no further specific complaints today.   PAST MEDICAL HISTORY: Past Medical History:  Diagnosis Date  . Breast cancer ([place]) 08/19/2012   LT LUMPECTOMY, radiation tx.   Marland Kitchen COPD (chronic obstructive pulmonary disease) ([place])   . Hailey-hailey disease   . Hyperlipemia   . Hypertension   . Lung cancer ([place]) 2014   RT  LUNG, radiation tx  . Migraine   . Myocardial infarction ([place])   . Osteoporosis   . Personal history of radiation therapy 2013   left breast ca  . Personal history of radiation therapy 2014   lung ca  . Radiation 2013   FOR BREAST CA  . Radiation 2014   FOR LUNG CA  . Renal artery stenosis ([place])     PAST SURGICAL HISTORY:  Past Surgical History:  Procedure Laterality Date  . ABDOMINAL HYSTERECTOMY    . APPENDECTOMY    . BREAST BIOPSY Left 08/03/2012   invasive mammary carcinoma, u/s guided bx  . 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 cardiac stents    . PARATHYROIDECTOMY    . TONSILLECTOMY      HEMATOLOGY/ONCOLOGY HISTORY:  Oncology History   No history exists.    ALLERGIES:  is allergic to bactrim [sulfamethoxazole-trimethoprim], ibuprofen, macrobid [nitrofurantoin monohyd macro], and tape.  MEDICATIONS:  Current Outpatient Medications  Medication Sig Dispense Refill  . albuterol (PROAIR HFA) 108 (90 Base) MCG/ACT inhaler Inhale 2 puffs into the lungs every 6 (six) hours as needed for wheezing or shortness of breath.     . calcium-vitamin D (OSCAL WITH D) 500-200 MG-UNIT TABS tablet Take 1 tablet by mouth daily.     Marland Kitchen dexamethasone (DECADRON) 4 MG tablet TAKE 1 TABLET (4 MG TOTAL) BY MOUTH DAILY. 30 tablet 0  . diltiazem (CARDIZEM CD) 120 MG 24 hr capsule Take 1 capsule (120 mg total) by mouth daily. 30 capsule 0  .  ELIQUIS 2.5 MG TABS tablet Take 2.5 mg by mouth 2 (two) times daily.    . feeding supplement, ENSURE ENLIVE, (ENSURE ENLIVE) LIQD Take 237 mLs by mouth 2 (two) times daily between meals. 237 mL 12  . furosemide (LASIX) 20 MG tablet Take 0.5 tablets (10 mg total) by mouth daily. 30 tablet 0  . metoprolol succinate (TOPROL-XL) 25 MG 24 hr tablet Take 25 mg by mouth 2 (two) times daily.     . Multiple Vitamin (MULTIVITAMIN WITH MINERALS) TABS tablet Take 1 tablet by mouth daily.    . Multiple  Vitamins-Minerals (PRESERVISION AREDS PO) Take 1 tablet by mouth 2 (two) times daily.    . ondansetron (ZOFRAN) 8 MG tablet Take 1 tablet (8 mg total) by mouth 2 (two) times daily as needed for refractory nausea / vomiting. 60 tablet 1  . simvastatin (ZOCOR) 20 MG tablet Take 20 mg by mouth at bedtime.    . traMADol (ULTRAM) 50 MG tablet Take 1 tablet (50 mg total) by mouth every 12 (twelve) hours as needed. 12 tablet 0   No current facility-administered medications for this visit.   Facility-Administered Medications Ordered in Other Visits  Medication Dose Route Frequency Provider Last Rate Last Admin  . 0.9 %  sodium chloride infusion   Intravenous Continuous Lloyd Huger, MD 100 mL/hr at 02/17/20 1115 New Bag at 02/17/20 1115    VITAL SIGNS: There were no vitals taken for this visit. There were no vitals filed for this visit.  Estimated body mass index is 20 kg/m as calculated from the following:   Height as of 04/06/20: 5\' 3"  (1.6 m).   Weight as of 04/12/20: 112 lb 14.4 oz (51.2 kg).  LABS: CBC:    Component Value Date/Time   WBC 10.6 (H) 04/06/2020 1222   HGB 12.2 04/06/2020 1222   HGB 12.9 03/05/2015 2050   HCT 38.5 04/06/2020 1222   HCT 39.8 03/05/2015 2050   PLT 180 04/06/2020 1222   PLT 222 03/05/2015 2050   MCV 98.5 04/06/2020 1222   MCV 93 03/05/2015 2050   NEUTROABS 8.5 (H) 04/04/2020 1419   NEUTROABS 10.2 (H) 03/05/2015 2050   LYMPHSABS 0.5 (L) 04/04/2020 1419   LYMPHSABS 1.8 03/05/2015 2050   MONOABS 0.4 04/04/2020 1419   MONOABS 1.2 (H) 03/05/2015 2050   EOSABS 0.0 04/04/2020 1419   EOSABS 0.2 03/05/2015 2050   BASOSABS 0.0 04/04/2020 1419   BASOSABS 0.1 03/05/2015 2050   Comprehensive Metabolic Panel:    Component Value Date/Time   NA 143 04/06/2020 1222   NA 136 03/05/2015 2050   K 4.1 04/06/2020 1222   K 3.9 03/05/2015 2050   CL 107 04/06/2020 1222   CL 107 03/05/2015 2050   CO2 28 04/06/2020 1222   CO2 22 03/05/2015 2050   BUN 50 (H)  04/06/2020 1222   BUN 38 (H) 03/05/2015 2050   CREATININE 1.26 (H) 04/06/2020 1222   CREATININE 1.47 (H) 03/05/2015 2050   GLUCOSE 111 (H) 04/06/2020 1222   GLUCOSE 116 (H) 03/05/2015 2050   CALCIUM 9.0 04/06/2020 1222   CALCIUM 9.0 04/04/2020 1419   AST 28 03/09/2020 0824   ALT 25 03/09/2020 0824   ALKPHOS 104 03/09/2020 0824   BILITOT 0.6 03/09/2020 0824   PROT 8.0 03/09/2020 0824   ALBUMIN 3.3 (L) 04/04/2020 1419    RADIOGRAPHIC STUDIES: DG Chest 2 View  Result Date: 04/06/2020 CLINICAL DATA:  Shortness of breath on exertion EXAM: CHEST - 2 VIEW COMPARISON:  02/20/2020 FINDINGS: Cardiac shadows within normal limits. Tortuosity of the thoracic aorta with mild dilatation in the descending aorta is again seen and stable. Scarring is noted in the right mid lung similar to that seen on the prior exam. No sizable effusion is seen. Some persistent parenchymal opacity is noted in the right base stable from the prior study. Chronic compression deformities are noted in the lower thoracic spine. IMPRESSION: Persistent but improved right basilar opacity. Persistent scarring on the right along the minor fissure. Stable dilatation of the descending thoracic aorta. Electronically Signed   By: Inez Catalina M.D.   On: 04/06/2020 13:04    PERFORMANCE STATUS (ECOG) : 4 - Bedbound  Review of Systems Unless otherwise noted, a complete review of systems is negative.  Physical Exam General: Thin, frail-appearing Cardiovascular: regular rate and rhythm Pulmonary: clear ant fields Abdomen: soft, nontender, + bowel sounds GU: no suprapubic tenderness Extremities: no edema, no joint deformities Skin: no rashes, small stage II ulcer L. Buttocks. See image Neurological: Distal lower extremity weakness but otherwise nonfocal      Assessment and Plan-Ms. Hufstetler is a 86 year old woman  with multiple medical problems including stage Ia left breast cancer status post 5 years of letrozole, stage Ia  adenocarcinoma the right upper lobe of the lung status post XRT in 2014, now with left lower lobe lung mass highly suspicious for malignancy.  Patient has declined biopsy to confirm recurrent lung cancer.  Stage was at least IIa. She is s/p XRT and carbo/Taxol. Plan was for PET scan on 05/10/20.   Recurrent lung cancer -pending PET scan and follow-up visit with Dr. Grayland Ormond.   Weakness -unclear etiology.  Appears to have some dehydration on labs.  Other labs unremarkable. Nonfocal exam other than generalized weakness. Will give normal saline 500 mL bolus x1.  Patient is unable to provide for self-care.  Discussed ER/hospitalization but patient declined.  Daughter is arranging for patient to stay with family to provide 24/7 care as daughter works 24-hour shifts for EMS.  Will order home health PT/OT for weakness.  We will also order SN for wound care and CNA for hygiene care. DME: hospital bed, wheel chair, bedside commode, lift chair  Sacral wound -small stage II pressure ulcer on left buttocks.  Hydrocolloid dressing applied.  Instructions provided to daughter. Discussed pressure offloading.   Case and plan discussed with Dr. Grayland Ormond.   Patient expressed understanding and was in agreement with this plan. She also understands that She can call clinic at any time with any questions, concerns, or complaints.   Thank you for allowing me to participate in the care of this very pleasant patient.   Time Total: 30 minutes  Visit consisted of counseling and education dealing with the complex and emotionally intense issues of symptom management and palliative care in the setting of serious and potentially life-threatening illness.Greater than 50%  of this time was spent counseling and coordinating care related to the above assessment and plan.  Signed by: Altha Harm, PhD, NP-C

## 2020-04-30 NOTE — Telephone Encounter (Signed)
Would prefer [facility], but I know Josh is packed today and tomorrow.

## 2020-04-30 NOTE — Telephone Encounter (Signed)
Spoke to [place], patient's daughter and scheduled her for labs and evaluation in the symptom management clinic today.

## 2020-04-30 NOTE — Progress Notes (Signed)
Daughter would like to provide the following address. For home health.  [facility] [address] [place], [place] [address]  phone [phone]

## 2020-04-30 NOTE — Telephone Encounter (Signed)
Daughter Vania Rea called asking if patient can be seen today. She reports that she was gone on vacation for the past week and that she returned to found that her mother who stayed with a niece has declined. She is weak, unable to get up by herself, she has a wound on her bottom, and that she has had a chronic worsening of confusion over the past several months. She called PCP who advised htat she take her to Urgent Care, but she states that is not where she needs to go. Please advise

## 2020-05-01 ENCOUNTER — Other Ambulatory Visit: Payer: Self-pay | Admitting: Hospice and Palliative Medicine

## 2020-05-01 ENCOUNTER — Other Ambulatory Visit: Payer: Self-pay | Admitting: *Deleted

## 2020-05-01 DIAGNOSIS — C3411 Malignant neoplasm of upper lobe, right bronchus or lung: Secondary | ICD-10-CM

## 2020-05-01 DIAGNOSIS — C50312 Malignant neoplasm of lower-inner quadrant of left female breast: Secondary | ICD-10-CM

## 2020-05-01 DIAGNOSIS — R531 Weakness: Secondary | ICD-10-CM

## 2020-05-01 DIAGNOSIS — R262 Difficulty in walking, not elsewhere classified: Secondary | ICD-10-CM

## 2020-05-01 MED ORDER — AMOXICILLIN-POT CLAVULANATE 875-125 MG PO TABS: 1.0000 | ORAL_TABLET | Freq: Two times a day (BID) | ORAL | 0 refills | Status: DC

## 2020-05-01 NOTE — Progress Notes (Signed)
Urine culture positive for E. coli greater than 100,000 CFU's.  Sensitivities are pending.  However, will go ahead and start empiric coverage with Augmentin 875 BID x 5 days. Discussed abx regimen with nephrology - Dr. Holley Raring per daughter's request. Also discussed with Dr. Grayland Ormond.   Rx sent to [facility] in [place], [place] on [address].

## 2020-05-02 LAB — URINE CULTURE: Culture: >=100000 — AB

## 2020-05-02 NOTE — Progress Notes (Signed)
PSN spoke with patient's daughter, [name] [name], today.  [name] [name] is currently staying with patient at all times due to her inability to care for herself.  Home Health has been set up, as well as medical equipment to help with patient's care.  Daughter reported that she needed to hire sitters to care for her mother while she was at work.  PSN gave her the names and numbers of several agencies that provide private duty sitters.  PSN informed daughter that insurance does not pay for sitter care.  Daughter stated that patient has a long term care policy.  PSN informed her that an agency that she hires will most likely help with filing the insurance for reimbursement.

## 2020-05-03 ENCOUNTER — Telehealth: Payer: Self-pay | Admitting: *Deleted

## 2020-05-03 NOTE — Telephone Encounter (Signed)
[name] [name] - can we send referral to another agency?

## 2020-05-03 NOTE — Telephone Encounter (Signed)
Did she by chance mention the name of any other home health agencies in the area?

## 2020-05-03 NOTE — Telephone Encounter (Signed)
No, only that when I asked if she knew any other agencies, she said she did not know if they were accepting or not. When I [place] agencies in [place], there are a lot to choose from

## 2020-05-03 NOTE — Telephone Encounter (Signed)
I'm referring her to the [facility] in [place].   Thanks! Judson Roch

## 2020-05-03 NOTE — Telephone Encounter (Signed)
Mindy with [facility] home [facility] called to say that they could not accept the referral you sent due to availability issues. She doe snot know if any of the other agencies in the area can take referral or not

## 2020-05-06 NOTE — Progress Notes (Deleted)
[facility]  [phone] [phone]  ID: [name] [name] OB: January 11, 1928  MR#: [id number]  UVO#:[id number]  Patient Care Team: Ezequiel Kayser, MD as PCP - General (Internal Medicine) Lloyd Huger, MD as Medical Oncologist (Medical Oncology)   CHIEF COMPLAINT: At least stage IIa left lower lobe lung malignancy.    INTERVAL HISTORY: Patient returns to clinic today for further evaluation and consideration of her sixth and final weekly treatment of carboplatinum and Taxol. She continues to feel well and remains asymptomatic.  She denies any weakness or fatigue.  She denies any further dyspnea on exertion. She does not complain of pain today. She has no neurologic complaints.  She denies any recent fevers or illnesses.  She denies any chest pain, shortness of breath, cough, or hemoptysis.  She denies any nausea, vomiting, constipation, or diarrhea. She has no urinary complaints. Patient offers no specific complaints today.  REVIEW OF SYSTEMS:   Review of Systems  Constitutional: Negative.  Negative for fever, malaise/fatigue and weight loss.  Respiratory: Negative.  Negative for cough and shortness of breath.   Cardiovascular: Negative.  Negative for chest pain and leg swelling.  Gastrointestinal: Negative.  Negative for abdominal pain, blood in stool, constipation, diarrhea, nausea and vomiting.  Genitourinary: Negative.  Negative for flank pain.  Musculoskeletal: Negative for back pain.  Skin: Negative.  Negative for rash.  Neurological: Negative.  Negative for tingling, sensory change, focal weakness, weakness and headaches.  Psychiatric/Behavioral: Negative.  The patient is not nervous/anxious and does not have insomnia.     As per HPI. Otherwise, a complete review of systems is negative.  PAST MEDICAL HISTORY: Past Medical History:  Diagnosis Date  . Breast cancer ([place]) 08/19/2012   LT LUMPECTOMY, radiation tx.   Marland Kitchen COPD (chronic  obstructive pulmonary disease) ([place])   . Hailey-hailey disease   . Hyperlipemia   . Hypertension   . Lung cancer ([place]) 2014   RT LUNG, radiation tx  . Migraine   . Myocardial infarction ([place])   . Osteoporosis   . Personal history of radiation therapy 2013   left breast ca  . Personal history of radiation therapy 2014   lung ca  . Radiation 2013   FOR BREAST CA  . Radiation 2014   FOR LUNG CA  . Renal artery stenosis ([place])     PAST SURGICAL HISTORY: Past Surgical History:  Procedure Laterality Date  . ABDOMINAL HYSTERECTOMY    . APPENDECTOMY    . BREAST BIOPSY Left 08/03/2012   invasive mammary carcinoma, u/s guided bx  . 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 cardiac stents    . PARATHYROIDECTOMY    . TONSILLECTOMY      FAMILY HISTORY Family History  Problem Relation Age of Onset  . Breast cancer Sister 44       ADVANCED DIRECTIVES:    HEALTH MAINTENANCE: Social History   Tobacco Use  . Smoking status: Current Every Day Smoker    Packs/day: 1.00    Years: 70.00    Pack years: 70.00  . Smokeless tobacco: Never Used  Vaping Use  . Vaping Use: Never used  Substance Use Topics  . Alcohol use: No  . Drug use: No      Allergies  Allergen Reactions  . Bactrim [Sulfamethoxazole-Trimethoprim] Other (See Comments)    Reduced kidney function.  Per Dr. Holley Raring should not take  . Ibuprofen     Reduced  kidney function. Per Dr. Holley Raring should not take  . Macrobid [employer] Macro] Other (See Comments)    Reduced Kidney function.  Should no longer take per Dr. Holley Raring  . Tape Rash    Current Outpatient Medications  Medication Sig Dispense Refill  . albuterol (PROAIR HFA) 108 (90 Base) MCG/ACT inhaler Inhale 2 puffs into the lungs every 6 (six) hours as needed for wheezing or shortness of breath.     Marland Kitchen amoxicillin-clavulanate (AUGMENTIN) 875-125 MG tablet Take 1 tablet by mouth 2 (two)  times daily. 10 tablet 0  . calcium-vitamin D (OSCAL WITH D) 500-200 MG-UNIT TABS tablet Take 1 tablet by mouth daily.     Marland Kitchen dexamethasone (DECADRON) 4 MG tablet TAKE 1 TABLET (4 MG TOTAL) BY MOUTH DAILY. 30 tablet 0  . diltiazem (CARDIZEM CD) 120 MG 24 hr capsule Take 1 capsule (120 mg total) by mouth daily. 30 capsule 0  . ELIQUIS 2.5 MG TABS tablet Take 2.5 mg by mouth 2 (two) times daily.    . feeding supplement, ENSURE ENLIVE, (ENSURE ENLIVE) LIQD Take 237 mLs by mouth 2 (two) times daily between meals. 237 mL 12  . furosemide (LASIX) 20 MG tablet Take 0.5 tablets (10 mg total) by mouth daily. 30 tablet 0  . metoprolol succinate (TOPROL-XL) 25 MG 24 hr tablet Take 25 mg by mouth 2 (two) times daily.     . Multiple Vitamin (MULTIVITAMIN WITH MINERALS) TABS tablet Take 1 tablet by mouth daily.    . Multiple Vitamins-Minerals (PRESERVISION AREDS PO) Take 1 tablet by mouth 2 (two) times daily.    . ondansetron (ZOFRAN) 8 MG tablet Take 1 tablet (8 mg total) by mouth 2 (two) times daily as needed for refractory nausea / vomiting. 60 tablet 1  . simvastatin (ZOCOR) 20 MG tablet Take 20 mg by mouth at bedtime.    . traMADol (ULTRAM) 50 MG tablet Take 1 tablet (50 mg total) by mouth every 12 (twelve) hours as needed. 12 tablet 0   No current facility-administered medications for this visit.   Facility-Administered Medications Ordered in Other Visits  Medication Dose Route Frequency Provider Last Rate Last Admin  . 0.9 %  sodium chloride infusion   Intravenous Continuous Lloyd Huger, MD 100 mL/hr at 02/17/20 1115 New Bag at 02/17/20 1115    OBJECTIVE: There were no vitals filed for this visit.   There is no height or weight on file to calculate BMI.    ECOG FS:0 - Asymptomatic  General: Thin, no acute distress. Eyes: Pink conjunctiva, anicteric sclera. HEENT: Normocephalic, moist mucous membranes. Lungs: No audible wheezing or coughing. Heart: Regular rate and rhythm. Abdomen: Soft,  nontender, no obvious distention. Musculoskeletal: No edema, cyanosis, or clubbing. Neuro: Alert, answering all questions appropriately. Cranial nerves grossly intact. Skin: No rashes or petechiae noted. Psych: Normal affect.   LAB RESULTS:  Lab Results  Component Value Date   NA 143 04/30/2020   K 4.3 04/30/2020   CL 105 04/30/2020   CO2 27 04/30/2020   GLUCOSE 119 (H) 04/30/2020   BUN 72 (H) 04/30/2020   CREATININE 1.37 (H) 04/30/2020   CALCIUM 8.6 (L) 04/30/2020   PROT 6.4 (L) 04/30/2020   ALBUMIN 3.3 (L) 04/30/2020   AST 22 04/30/2020   ALT 34 04/30/2020   ALKPHOS 79 04/30/2020   BILITOT 0.7 04/30/2020   GFRNONAA 34 (L) 04/30/2020   GFRAA 39 (L) 04/30/2020    Lab Results  Component Value Date   WBC 10.8 (  H) 04/30/2020   NEUTROABS 9.7 (H) 04/30/2020   HGB 13.7 04/30/2020   HCT 42.1 04/30/2020   MCV 98.4 04/30/2020   PLT 126 (L) 04/30/2020     STUDIES: DG Chest 2 View  Result Date: 04/06/2020 CLINICAL DATA:  Shortness of breath on exertion EXAM: CHEST - 2 VIEW COMPARISON:  02/20/2020 FINDINGS: Cardiac shadows within normal limits. Tortuosity of the thoracic aorta with mild dilatation in the descending aorta is again seen and stable. Scarring is noted in the right mid lung similar to that seen on the prior exam. No sizable effusion is seen. Some persistent parenchymal opacity is noted in the right base stable from the prior study. Chronic compression deformities are noted in the lower thoracic spine. IMPRESSION: Persistent but improved right basilar opacity. Persistent scarring on the right along the minor fissure. Stable dilatation of the descending thoracic aorta. Electronically Signed   By: Inez Catalina M.D.   On: 04/06/2020 13:04    ASSESSMENT: At least stage IIa left lower lobe lung malignancy.   PLAN:    1.  At least stage IIa left lower lobe lung malignancy: Patient's initial malignancy was in the right upper lobe, therefore it is unclear if this is a second  primary or a metastatic lesion.  PET scan results from November 09, 2019 reviewed independently confirming hypermetabolic lesion.  Patient has declined biopsy to confirm diagnosis. Patient completed XRT yesterday, March 08, 2020. Proceed with her sixth and final treatment of weekly carboplatinum and Taxol today. No further intervention is needed. Return to clinic in 2 months with repeat imaging with PET scan and further evaluation.  2.  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  PET scan results from Apr 02, 2018 revealed suspected recurrence in subcarinal lymph nodes.  She underwent XRT completing in August 2019.  PET scan results as above.  Unclear if this is a recurrence or second primary. 3. 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  Oncotype testing was not performed as this would not change the treatment plan.  Patient completed 5 years of letrozole in November 2019.  Her most recent mammogram on October 31, 2019 was reported BI-RADS 1.  Repeat in 1 year. 4.  Osteoporosis: Patient's most recent bone mineral density on October 31, 2019 reported T score of -3.1 which is unchanged from 1 year prior. Patient last received Prolia on October 06, 2019. Patient will receive Prolia with her next clinic visit. 5.  Renal insufficiency: Essentially resolved, monitor. 6.  Nausea: Patient does not complain of this today.  Continue current oral antiemetics as prescribed.   7. Anemia: Resolved.   8.  Atrial fibrillation: Patient is now rate controlled and asymptomatic.  Continue follow-up with cardiology as indicated. 9.  Hyperkalemia: Resolved.    Patient expressed understanding and was in agreement with this plan. She also understands that She can call clinic at any time with any questions,  concerns, or complaints.    Lloyd Huger, MD 05/06/20 9:16 AM

## 2020-05-07 ENCOUNTER — Other Ambulatory Visit: Payer: Self-pay | Admitting: Emergency Medicine

## 2020-05-07 ENCOUNTER — Other Ambulatory Visit: Payer: Self-pay | Admitting: *Deleted

## 2020-05-07 ENCOUNTER — Telehealth: Payer: Self-pay | Admitting: *Deleted

## 2020-05-07 DIAGNOSIS — R918 Other nonspecific abnormal finding of lung field: Secondary | ICD-10-CM

## 2020-05-07 DIAGNOSIS — N390 Urinary tract infection, site not specified: Secondary | ICD-10-CM

## 2020-05-07 NOTE — Telephone Encounter (Signed)
Spoke to [place]. She will pick up urine container for UA and urine culture this afternoon and collect and bring back to [place] tomorrow. She suggested Keflex if it comes back positive this time because she feels like it would also help the wound on her bottom.

## 2020-05-07 NOTE — Telephone Encounter (Signed)
Daughter Vania Rea called reporting that patient has competed antibiotics, but is still having burning and pain with urination and would like to have patient urine rechecked for UA and C&S. Please advise

## 2020-05-08 ENCOUNTER — Other Ambulatory Visit: Payer: Self-pay

## 2020-05-08 DIAGNOSIS — C3432 Malignant neoplasm of lower lobe, left bronchus or lung: Secondary | ICD-10-CM | POA: Diagnosis not present

## 2020-05-08 DIAGNOSIS — N390 Urinary tract infection, site not specified: Secondary | ICD-10-CM

## 2020-05-08 LAB — URINALYSIS, COMPLETE (UACMP) WITH MICROSCOPIC
Bilirubin Urine: NEGATIVE
Glucose, UA: NEGATIVE mg/dL
Hgb urine dipstick: NEGATIVE
Ketones, ur: NEGATIVE mg/dL
Nitrite: NEGATIVE
Protein, ur: NEGATIVE mg/dL
Specific Gravity, Urine: 1.026 (ref 1.005–1.030)
pH: 5 (ref 5.0–8.0)

## 2020-05-10 ENCOUNTER — Other Ambulatory Visit: Payer: Self-pay

## 2020-05-10 ENCOUNTER — Ambulatory Visit
Admission: RE | Admit: 2020-05-10 | Discharge: 2020-05-10 | Disposition: A | Discharge status: Home / Self Care | Payer: Medicare Other | Source: Ambulatory Visit | Attending: Oncology | Admitting: Oncology

## 2020-05-10 DIAGNOSIS — R918 Other nonspecific abnormal finding of lung field: Secondary | ICD-10-CM | POA: Diagnosis not present

## 2020-05-10 DIAGNOSIS — D3502 Benign neoplasm of left adrenal gland: Secondary | ICD-10-CM | POA: Diagnosis not present

## 2020-05-10 DIAGNOSIS — J90 Pleural effusion, not elsewhere classified: Secondary | ICD-10-CM | POA: Diagnosis present

## 2020-05-10 DIAGNOSIS — I714 Abdominal aortic aneurysm, without rupture: Secondary | ICD-10-CM | POA: Diagnosis not present

## 2020-05-10 DIAGNOSIS — C3432 Malignant neoplasm of lower lobe, left bronchus or lung: Secondary | ICD-10-CM | POA: Insufficient documentation

## 2020-05-10 DIAGNOSIS — S2241XA Multiple fractures of ribs, right side, initial encounter for closed fracture: Secondary | ICD-10-CM | POA: Diagnosis not present

## 2020-05-10 DIAGNOSIS — K5730 Diverticulosis of large intestine without perforation or abscess without bleeding: Secondary | ICD-10-CM | POA: Diagnosis not present

## 2020-05-10 LAB — GLUCOSE, CAPILLARY: Glucose-Capillary: 87 mg/dL (ref 70–99)

## 2020-05-10 MED ORDER — FLUDEOXYGLUCOSE F - 18 (FDG) INJECTION
5.8000 | Freq: Once | INTRAVENOUS | Status: AC | PRN
  Administered 2020-05-10: 6.14 via INTRAVENOUS

## 2020-05-11 ENCOUNTER — Inpatient Hospital Stay: Payer: Medicare Other

## 2020-05-11 ENCOUNTER — Telehealth: Payer: Self-pay | Admitting: Emergency Medicine

## 2020-05-11 ENCOUNTER — Encounter: Payer: Self-pay | Admitting: Oncology

## 2020-05-11 ENCOUNTER — Inpatient Hospital Stay: Payer: Medicare Other | Admitting: Oncology

## 2020-05-11 ENCOUNTER — Telehealth: Payer: Self-pay | Admitting: Oncology

## 2020-05-11 DIAGNOSIS — N390 Urinary tract infection, site not specified: Secondary | ICD-10-CM

## 2020-05-11 MED ORDER — AMOXICILLIN-POT CLAVULANATE 875-125 MG PO TABS: 1.0000 | ORAL_TABLET | Freq: Two times a day (BID) | ORAL | 0 refills | Status: DC

## 2020-05-11 NOTE — Telephone Encounter (Signed)
Patient's daughter phoned on this date stating that patient would not be able to attend appt today as daughter was in a class all day and would not be able to bring her. Appts rescheduled for 05-14-20.

## 2020-05-11 NOTE — Telephone Encounter (Signed)
Called daughter to go over results of urine culture and sensitivity and to let her know that we would be calling in augementin to the pharmacy. Pt's daughter verbalized understanding and didn't have any further questions or concerns.

## 2020-05-12 LAB — URINE CULTURE: Culture: 20000 — AB

## 2020-05-14 ENCOUNTER — Other Ambulatory Visit: Payer: Self-pay

## 2020-05-14 ENCOUNTER — Inpatient Hospital Stay (HOSPITAL_BASED_OUTPATIENT_CLINIC_OR_DEPARTMENT_OTHER): Payer: Medicare Other | Admitting: Oncology

## 2020-05-14 ENCOUNTER — Inpatient Hospital Stay (HOSPITAL_BASED_OUTPATIENT_CLINIC_OR_DEPARTMENT_OTHER): Payer: Medicare Other | Admitting: Hospice and Palliative Medicine

## 2020-05-14 ENCOUNTER — Encounter: Payer: Self-pay | Admitting: Oncology

## 2020-05-14 ENCOUNTER — Inpatient Hospital Stay: Payer: Medicare Other

## 2020-05-14 ENCOUNTER — Other Ambulatory Visit: Payer: Self-pay | Admitting: Emergency Medicine

## 2020-05-14 VITALS — BP 93/63 | HR 71 | Temp 96.7°F | Resp 20 | Ht 63.0 in | Wt 106.2 lb

## 2020-05-14 DIAGNOSIS — N390 Urinary tract infection, site not specified: Secondary | ICD-10-CM

## 2020-05-14 DIAGNOSIS — R918 Other nonspecific abnormal finding of lung field: Secondary | ICD-10-CM

## 2020-05-14 DIAGNOSIS — Z515 Encounter for palliative care: Secondary | ICD-10-CM | POA: Diagnosis not present

## 2020-05-14 DIAGNOSIS — C3432 Malignant neoplasm of lower lobe, left bronchus or lung: Secondary | ICD-10-CM | POA: Diagnosis not present

## 2020-05-14 LAB — URINALYSIS, COMPLETE (UACMP) WITH MICROSCOPIC
Bacteria, UA: NONE SEEN
Bilirubin Urine: NEGATIVE
Glucose, UA: NEGATIVE mg/dL
Hgb urine dipstick: NEGATIVE
Ketones, ur: NEGATIVE mg/dL
Leukocytes,Ua: NEGATIVE
Nitrite: NEGATIVE
Protein, ur: NEGATIVE mg/dL
Specific Gravity, Urine: 1.014 (ref 1.005–1.030)
pH: 5 (ref 5.0–8.0)

## 2020-05-14 LAB — CBC WITH DIFFERENTIAL/PLATELET
Abs Immature Granulocytes: 0.06 10*3/uL (ref 0.00–0.07)
Basophils Absolute: 0 10*3/uL (ref 0.0–0.1)
Basophils Relative: 0 %
Eosinophils Absolute: 0 10*3/uL (ref 0.0–0.5)
Eosinophils Relative: 0 %
HCT: 32.4 % — ABNORMAL LOW (ref 36.0–46.0)
Hemoglobin: 10.9 g/dL — ABNORMAL LOW (ref 12.0–15.0)
Immature Granulocytes: 1 %
Lymphocytes Relative: 19 %
Lymphs Abs: 1 10*3/uL (ref 0.7–4.0)
MCH: 32.2 pg (ref 26.0–34.0)
MCHC: 33.6 g/dL (ref 30.0–36.0)
MCV: 95.6 fL (ref 80.0–100.0)
Monocytes Absolute: 0.3 10*3/uL (ref 0.1–1.0)
Monocytes Relative: 5 %
Neutro Abs: 3.6 10*3/uL (ref 1.7–7.7)
Neutrophils Relative %: 75 %
Platelets: 91 10*3/uL — ABNORMAL LOW (ref 150–400)
RBC: 3.39 MIL/uL — ABNORMAL LOW (ref 3.87–5.11)
RDW: 18.3 % — ABNORMAL HIGH (ref 11.5–15.5)
WBC: 4.9 10*3/uL (ref 4.0–10.5)
nRBC: 0.4 % — ABNORMAL HIGH (ref 0.0–0.2)

## 2020-05-14 LAB — COMPREHENSIVE METABOLIC PANEL
ALT: 37 U/L (ref 0–44)
AST: 28 U/L (ref 15–41)
Albumin: 2.7 g/dL — ABNORMAL LOW (ref 3.5–5.0)
Alkaline Phosphatase: 91 U/L (ref 38–126)
Anion gap: 10 (ref 5–15)
BUN: 38 mg/dL — ABNORMAL HIGH (ref 8–23)
CO2: 26 mmol/L (ref 22–32)
Calcium: 8.1 mg/dL — ABNORMAL LOW (ref 8.9–10.3)
Chloride: 102 mmol/L (ref 98–111)
Creatinine, Ser: 0.97 mg/dL (ref 0.44–1.00)
GFR calc Af Amer: 60 mL/min — ABNORMAL LOW (ref >60)
GFR calc non Af Amer: 51 mL/min — ABNORMAL LOW (ref >60)
Glucose, Bld: 107 mg/dL — ABNORMAL HIGH (ref 70–99)
Potassium: 4.1 mmol/L (ref 3.5–5.1)
Sodium: 138 mmol/L (ref 135–145)
Total Bilirubin: 0.7 mg/dL (ref 0.3–1.2)
Total Protein: 5.8 g/dL — ABNORMAL LOW (ref 6.5–8.1)

## 2020-05-14 MED ORDER — DEXAMETHASONE 4 MG PO TABS: 4.0000 mg | ORAL_TABLET | Freq: Every day | ORAL | 0 refills | Status: DC

## 2020-05-14 NOTE — Progress Notes (Signed)
Patient here for follow up. Patient and daughter report she is having trouble with her appetite and needs new prescription for decadron. Reported patient currently has UTI which she has already been treated for and currently on antibiotics. Reports no BM in past 4 days and having some shortness of breath. Daughter also reports patient is having some back pain that just started in the last 2 days.

## 2020-05-14 NOTE — Progress Notes (Signed)
[name] [name]  [phone] [phone]   Name: [name] [name] [facility] Date: 05/14/2020 MRN: [id number]  DOB: 1927-11-26  Patient Care Team: Ezequiel Kayser, MD as PCP - General (Internal Medicine) Lloyd Huger, MD as Medical Oncologist (Medical Oncology)    REASON FOR CONSULTATION: [name] [name] is a 86 y.o. 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 Patient has declined biopsy to confirm recurrent lung cancer. Stage was at least IIa. She is s/p XRT and carbo/Taxol. PET scan on 05/10/20 revealed interval disease improvement.  Patient was referred to palliative care to help address goals and manage ongoing symptoms.  SOCIAL HISTORY:     reports that she has been smoking. She has a 70.00 pack-year smoking history. She has never used smokeless tobacco. She reports that she does not drink alcohol and does not use drugs.   Patient is widowed.  She lives at home with a daughter.  In total, she has 2 daughters and 2 sons.  Patient previously worked in [employer].  ADVANCE DIRECTIVES:  Not on file  CODE STATUS: DNR in [place]: Past Medical History:  Diagnosis Date  . Breast cancer ([place]) 08/19/2012   LT LUMPECTOMY, radiation tx.   Marland Kitchen COPD (chronic obstructive pulmonary disease) ([place])   . Hailey-hailey disease   . Hyperlipemia   . Hypertension   . Lung cancer ([place]) 2014   RT LUNG, radiation tx  . Migraine   . Myocardial infarction ([place])   . Osteoporosis   . Personal history of radiation therapy 2013   left breast ca  . Personal history of radiation therapy 2014   lung ca  . Radiation 2013   FOR BREAST CA  . Radiation 2014   FOR LUNG CA  . Renal artery stenosis  ([place])     PAST SURGICAL HISTORY:  Past Surgical History:  Procedure Laterality Date  . ABDOMINAL HYSTERECTOMY    . APPENDECTOMY    . BREAST BIOPSY Left 08/03/2012   invasive mammary carcinoma, u/s guided bx  . 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 cardiac stents    . PARATHYROIDECTOMY    . TONSILLECTOMY      HEMATOLOGY/ONCOLOGY HISTORY:  Oncology History   No history exists.    ALLERGIES:  is allergic to bactrim [sulfamethoxazole-trimethoprim], ibuprofen, macrobid [nitrofurantoin monohyd macro], and tape.  MEDICATIONS:  Current Outpatient Medications  Medication Sig Dispense Refill  . albuterol (PROAIR HFA) 108 (90 Base) MCG/ACT inhaler Inhale 2 puffs into the lungs every 6 (six) hours as needed for wheezing or shortness of breath.     Marland Kitchen amoxicillin-clavulanate (AUGMENTIN) 875-125 MG tablet Take 1 tablet by mouth 2 (two) times daily. 14 tablet 0  . calcium-vitamin D (OSCAL WITH D) 500-200 MG-UNIT TABS tablet Take 1 tablet by mouth daily.     Marland Kitchen dexamethasone (DECADRON) 4 MG tablet Take 1 tablet (4 mg total) by mouth daily. 30 tablet 0  . diltiazem (CARDIZEM CD) 120 MG 24 hr capsule Take 1 capsule (120 mg total) by mouth daily. 30 capsule 0  . ELIQUIS 2.5 MG TABS tablet Take 2.5 mg by mouth 2 (two) times daily.    . feeding supplement, ENSURE ENLIVE, (ENSURE  ENLIVE) LIQD Take 237 mLs by mouth 2 (two) times daily between meals. 237 mL 12  . furosemide (LASIX) 20 MG tablet Take 0.5 tablets (10 mg total) by mouth daily. 30 tablet 0  . metoprolol succinate (TOPROL-XL) 25 MG 24 hr tablet Take 25 mg by mouth 2 (two) times daily.     . Multiple Vitamin (MULTIVITAMIN WITH MINERALS) TABS tablet Take 1 tablet by mouth daily.    . Multiple Vitamins-Minerals (PRESERVISION AREDS PO) Take 1 tablet by mouth 2 (two) times daily.    . ondansetron (ZOFRAN) 8 MG tablet Take 1 tablet (8 mg total) by mouth 2 (two) times daily as needed for  refractory nausea / vomiting. 60 tablet 1  . simvastatin (ZOCOR) 20 MG tablet Take 20 mg by mouth at bedtime.    . traMADol (ULTRAM) 50 MG tablet Take 1 tablet (50 mg total) by mouth every 12 (twelve) hours as needed. 12 tablet 0   No current facility-administered medications for this visit.   Facility-Administered Medications Ordered in Other Visits  Medication Dose Route Frequency Provider Last Rate Last Admin  . 0.9 %  sodium chloride infusion   Intravenous Continuous Lloyd Huger, MD 100 mL/hr at 02/17/20 1115 New Bag at 02/17/20 1115    VITAL SIGNS: There were no vitals taken for this visit. There were no vitals filed for this visit.  Estimated body mass index is 18.81 kg/m as calculated from the following:   Height as of an earlier encounter on 05/14/20: 5\' 3"  (1.6 m).   Weight as of an earlier encounter on 05/14/20: 106 lb 3.2 oz (48.2 kg).  LABS: CBC:    Component Value Date/Time   WBC 4.9 05/14/2020 1347   HGB 10.9 (L) 05/14/2020 1347   HGB 12.9 03/05/2015 2050   HCT 32.4 (L) 05/14/2020 1347   HCT 39.8 03/05/2015 2050   PLT 91 (L) 05/14/2020 1347   PLT 222 03/05/2015 2050   MCV 95.6 05/14/2020 1347   MCV 93 03/05/2015 2050   NEUTROABS 3.6 05/14/2020 1347   NEUTROABS 10.2 (H) 03/05/2015 2050   LYMPHSABS 1.0 05/14/2020 1347   LYMPHSABS 1.8 03/05/2015 2050   MONOABS 0.3 05/14/2020 1347   MONOABS 1.2 (H) 03/05/2015 2050   EOSABS 0.0 05/14/2020 1347   EOSABS 0.2 03/05/2015 2050   BASOSABS 0.0 05/14/2020 1347   BASOSABS 0.1 03/05/2015 2050   Comprehensive Metabolic Panel:    Component Value Date/Time   NA 138 05/14/2020 1347   NA 136 03/05/2015 2050   K 4.1 05/14/2020 1347   K 3.9 03/05/2015 2050   CL 102 05/14/2020 1347   CL 107 03/05/2015 2050   CO2 26 05/14/2020 1347   CO2 22 03/05/2015 2050   BUN 38 (H) 05/14/2020 1347   BUN 38 (H) 03/05/2015 2050   CREATININE 0.97 05/14/2020 1347   CREATININE 1.47 (H) 03/05/2015 2050   GLUCOSE 107 (H) 05/14/2020  1347   GLUCOSE 116 (H) 03/05/2015 2050   CALCIUM 8.1 (L) 05/14/2020 1347   CALCIUM 9.0 04/04/2020 1419   AST 28 05/14/2020 1347   ALT 37 05/14/2020 1347   ALKPHOS 91 05/14/2020 1347   BILITOT 0.7 05/14/2020 1347   PROT 5.8 (L) 05/14/2020 1347   ALBUMIN 2.7 (L) 05/14/2020 1347    RADIOGRAPHIC STUDIES: NM PET Image Restag (PS) Skull Base To Thigh  Result Date: 05/10/2020 CLINICAL DATA:  Subsequent treatment strategy for lung cancer including left lower lobe mass and prior right upper lobe primary. Interval radiation therapy  of right lower lobe and left lower lobe lesions. EXAM: NUCLEAR MEDICINE PET SKULL BASE TO THIGH TECHNIQUE: 6.1 mCi F-18 FDG was injected intravenously. Full-ring PET imaging was performed from the skull base to thigh after the radiotracer. CT data was obtained and used for attenuation correction and anatomic localization. Fasting blood glucose: 87 mg/dl COMPARISON:  PET-CT of 11/09/2019 and CT chest from 02/17/2020 FINDINGS: Mediastinal blood pool activity: SUV max 2.3 Liver activity: SUV max NA NECK: No significant abnormal hypermetabolic activity in this region. Incidental CT findings: Bilateral common carotid atherosclerotic calcification. CHEST: Interval marked reduction in the left lower lobe para-aortic mass compared to prior PET-CT and compared to CT chest of 02/17/2020, with the soft tissue thickness in this vicinity measuring 1.1 cm on image 107/3 (formerly about 2.5 cm). Maximum SUV in this region of treated tumor is currently 3.6, previously 15.5. Some of the current activity could be related to local radiation pneumonitis. A band of density in the right upper lobe has similar morphology to prior exams with maximum SUV of 2.1 (formerly 1.9). Airway thickening and mild consolidation at the right lung base worsened from 11/09/2019 but mostly similar to 02/17/2020, with associated airway plugging and a maximum SUV along the region of volume loss and consolidation of 4.9.  Active inflammation favored over tumor as a cause, although surveillance is likely warranted. Notable diffuse esophageal activity observed with maximum SUV 4.0, probably incidental/physiologic. No prominent or hypermetabolic subcarinal adenopathy at this time. Incidental CT findings: Small residual right pleural effusion. The left pleural effusion has resolved. Coronary, aortic arch, and branch vessel atherosclerotic vascular disease. Stable descending thoracic aortic aneurysm. Centrilobular emphysema. ABDOMEN/PELVIS: Low-density left adrenal lesion appears stable and favors adenoma based on low density characteristics, maximum SUV 4.2, formerly 2.9. Physiologic activity bowel, most striking in the cecum where there is focal metabolic activity up to 31.4 but no CT correlate or significant abnormality in this vicinity on the prior exam, hence likely incidental. Incidental CT findings: Stable abdominal aortic aneurysm. Upper portion measures up to 4.8 cm transverse. Abdominal aortic atherosclerosis. Sigmoid colon diverticulosis. Suspected pelvic floor laxity. SKELETON: No significant abnormal hypermetabolic activity in this region. Incidental CT findings: Multiple healing right-sided rib fractures including the right fourth seventh, eighth, ninth, and tenth ribs, first shown on the rib radiographs from 11/28/2019. IMPRESSION: 1. Marked improvement in the left lower lobe mass adjacent to the descending thoracic aorta. Tissue in the vicinity of the mass currently measures only about 1.1 cm in thickness, with maximum SUV of 3.6, previously 15.5. 2. Mild progression in airway thickening, airway plugging, and consolidation in the right lower lobe, favoring an infectious etiology. Maximum SUV 4.9. Surveillance is likely warranted. 3. Reduced right pleural effusion and resolved left pleural effusion. 4. Other imaging findings of potential clinical significance: Aortic Atherosclerosis (ICD10-I70.0). Coronary and carotid  atherosclerotic calcification. Left adrenal adenoma. Descending thoracic and abdominal aortic aneurysms. Sigmoid colon diverticulosis. Pelvic floor laxity. Late phase healing of multiple right-sided rib fractures appear Electronically Signed   By: Van Clines M.D.   On: 05/10/2020 13:16    PERFORMANCE STATUS (ECOG) : 3 - Symptomatic, >50% confined to bed  Review of Systems Unless otherwise noted, a complete review of systems is negative.  Physical Exam General: NAD, thin, in wheelchair Pulmonary: Unlabored Extremities: no edema, no joint deformities Skin: Sacral ulcer noted but not visualized Neurological: Weakness but otherwise nonfocal  IMPRESSION: Patient returns to clinic today for routine follow-up.  She was seen by me on 05/01/2020  and was started on antibiotics with the Augmentin for pansensitive E. coli UTI.  Patient was started on a second course of Augmentin following completion of the first due to worsening turbidity of her urine.  Patient and daughter report improved urinary urgency/frequency.  Another urine/culture is being checked today to ensure resolution.  Patient remains weak.  Daughter is having to help her with standing and ambulation.  Normally, patient is able to ambulate without assistance.  We have previously ordered PT/OT but home health was unable to start as patient was out of town.  Will reorder PT/OT.  We will also order RN with wound protocol due to small sacral ulcer.  We will also order CNA for help with hygiene.  PLAN: -Continue current scope of treatment -Home health RN/wound protocol and CNA -Home health PT/OT due to weakness (I called and gave referral to Floydene Flock with [facility]) -Home-based palliative care -Repeat urinalysis/culture today -RTC to see me in 1 month and will see Dr. Grayland Ormond in 3 months  Case and plan discussed with Dr. Grayland Ormond   Patient expressed understanding and was in agreement with this plan. She also  understands that She can call the clinic at any time with any questions, concerns, or complaints.     Time Total: 15 minutes  Visit consisted of counseling and education dealing with the complex and emotionally intense issues of symptom management and palliative care in the setting of serious and potentially life-threatening illness.Greater than 50%  of this time was spent counseling and coordinating care related to the above assessment and plan.  Signed by: Altha Harm, PhD, NP-C

## 2020-05-15 NOTE — Progress Notes (Signed)
[name] [name]  [phone] [phone]  ID: [name] [name] OB: [birth year]  MR#: [id number]  TKP#:[id number]  Patient Care Team: Ezequiel Kayser, MD as PCP - General (Internal Medicine) Lloyd Huger, MD as Medical Oncologist (Medical Oncology)   CHIEF COMPLAINT: At least stage IIa left lower lobe lung malignancy.    INTERVAL HISTORY: Patient returns to clinic today for further evaluation and discussion of her PET scan results.  Her performance status has significantly declined over the past several weeks.  She has increased weakness and fatigue.  She is actively being treated for UTI.  She has no neurologic complaints.  She denies any recent fevers or illnesses.  She denies any chest pain, shortness of breath, cough, or hemoptysis.  She denies any nausea, vomiting, constipation, or diarrhea. She has no urinary complaints.  Patient feels generally terrible, but offers no further specific complaints today.  REVIEW OF SYSTEMS:   Review of Systems  Constitutional: Positive for malaise/fatigue. Negative for fever and weight loss.  Respiratory: Negative.  Negative for cough and shortness of breath.   Cardiovascular: Negative.  Negative for chest pain and leg swelling.  Gastrointestinal: Negative.  Negative for abdominal pain, blood in stool, constipation, diarrhea, nausea and vomiting.  Genitourinary: Positive for dysuria. Negative for flank pain.  Musculoskeletal: Negative for back pain.  Skin: Negative.  Negative for rash.  Neurological: Positive for weakness. Negative for tingling, sensory change, focal weakness and headaches.  Psychiatric/Behavioral: Negative.  The patient is not nervous/anxious and does not have insomnia.     As per HPI. Otherwise, a complete review of systems is negative.  PAST MEDICAL HISTORY: Past Medical History:  Diagnosis Date  . Breast cancer ([place]) 08/19/2012   LT LUMPECTOMY, radiation tx.   Marland Kitchen COPD (chronic  obstructive pulmonary disease) ([place])   . Hailey-hailey disease   . Hyperlipemia   . Hypertension   . Lung cancer ([place]) 2014   RT LUNG, radiation tx  . Migraine   . Myocardial infarction ([place])   . Osteoporosis   . Personal history of radiation therapy 2013   left breast ca  . Personal history of radiation therapy 2014   lung ca  . Radiation 2013   FOR BREAST CA  . Radiation 2014   FOR LUNG CA  . Renal artery stenosis ([place])     PAST SURGICAL HISTORY: Past Surgical History:  Procedure Laterality Date  . ABDOMINAL HYSTERECTOMY    . APPENDECTOMY    . BREAST BIOPSY Left 08/03/2012   invasive mammary carcinoma, u/s guided bx  . 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 cardiac stents    . PARATHYROIDECTOMY    . TONSILLECTOMY      FAMILY HISTORY Family History  Problem Relation Age of Onset  . Breast cancer Sister 34       ADVANCED DIRECTIVES:    HEALTH MAINTENANCE: Social History   Tobacco Use  . Smoking status: Current Every Day Smoker    Packs/day: 1.00    Years: 70.00    Pack years: 70.00  . Smokeless tobacco: Never Used  Vaping Use  . Vaping Use: Never used  Substance Use Topics  . Alcohol use: No  . Drug use: No      Allergies  Allergen Reactions  . Bactrim [Sulfamethoxazole-Trimethoprim] Other (See Comments)    Reduced kidney function.  Per Dr. Holley Raring should not take  . Ibuprofen     Reduced kidney  function. Per Dr. Holley Raring should not take  . Macrobid [employer] Macro] Other (See Comments)    Reduced Kidney function.  Should no longer take per Dr. Holley Raring  . Tape Rash    Current Outpatient Medications  Medication Sig Dispense Refill  . albuterol (PROAIR HFA) 108 (90 Base) MCG/ACT inhaler Inhale 2 puffs into the lungs every 6 (six) hours as needed for wheezing or shortness of breath.     Marland Kitchen amoxicillin-clavulanate (AUGMENTIN) 875-125 MG tablet Take 1 tablet by mouth 2 (two)  times daily. 14 tablet 0  . calcium-vitamin D (OSCAL WITH D) 500-200 MG-UNIT TABS tablet Take 1 tablet by mouth daily.     Marland Kitchen diltiazem (CARDIZEM CD) 120 MG 24 hr capsule Take 1 capsule (120 mg total) by mouth daily. 30 capsule 0  . ELIQUIS 2.5 MG TABS tablet Take 2.5 mg by mouth 2 (two) times daily.    . feeding supplement, ENSURE ENLIVE, (ENSURE ENLIVE) LIQD Take 237 mLs by mouth 2 (two) times daily between meals. 237 mL 12  . furosemide (LASIX) 20 MG tablet Take 0.5 tablets (10 mg total) by mouth daily. 30 tablet 0  . metoprolol succinate (TOPROL-XL) 25 MG 24 hr tablet Take 25 mg by mouth 2 (two) times daily.     . Multiple Vitamin (MULTIVITAMIN WITH MINERALS) TABS tablet Take 1 tablet by mouth daily.    . Multiple Vitamins-Minerals (PRESERVISION AREDS PO) Take 1 tablet by mouth 2 (two) times daily.    . ondansetron (ZOFRAN) 8 MG tablet Take 1 tablet (8 mg total) by mouth 2 (two) times daily as needed for refractory nausea / vomiting. 60 tablet 1  . simvastatin (ZOCOR) 20 MG tablet Take 20 mg by mouth at bedtime.    . traMADol (ULTRAM) 50 MG tablet Take 1 tablet (50 mg total) by mouth every 12 (twelve) hours as needed. 12 tablet 0  . dexamethasone (DECADRON) 4 MG tablet Take 1 tablet (4 mg total) by mouth daily. 30 tablet 0   No current facility-administered medications for this visit.   Facility-Administered Medications Ordered in Other Visits  Medication Dose Route Frequency Provider Last Rate Last Admin  . 0.9 %  sodium chloride infusion   Intravenous Continuous Lloyd Huger, MD 100 mL/hr at 02/17/20 1115 New Bag at 02/17/20 1115    OBJECTIVE: Vitals:   05/14/20 1413  BP: 93/63  Pulse: 71  Resp: 20  Temp: (!) 96.7 F (35.9 C)  SpO2: 97%     Body mass index is 18.81 kg/m.    ECOG FS:3 - Symptomatic, >50% confined to bed  General: Thin, no acute distress.  Sitting in a wheelchair. Eyes: Pink conjunctiva, anicteric sclera. HEENT: Normocephalic, moist mucous  membranes. Lungs: No audible wheezing or coughing. Heart: Regular rate and rhythm. Abdomen: Soft, nontender, no obvious distention. Musculoskeletal: No edema, cyanosis, or clubbing. Neuro: Alert, answering all questions appropriately. Cranial nerves grossly intact. Skin: No rashes or petechiae noted. Psych: Normal affect.   LAB RESULTS:  Lab Results  Component Value Date   NA 138 05/14/2020   K 4.1 05/14/2020   CL 102 05/14/2020   CO2 26 05/14/2020   GLUCOSE 107 (H) 05/14/2020   BUN 38 (H) 05/14/2020   CREATININE 0.97 05/14/2020   CALCIUM 8.1 (L) 05/14/2020   PROT 5.8 (L) 05/14/2020   ALBUMIN 2.7 (L) 05/14/2020   AST 28 05/14/2020   ALT 37 05/14/2020   ALKPHOS 91 05/14/2020   BILITOT 0.7 05/14/2020   GFRNONAA 51 (L)  05/14/2020   GFRAA 60 (L) 05/14/2020    Lab Results  Component Value Date   WBC 4.9 05/14/2020   NEUTROABS 3.6 05/14/2020   HGB 10.9 (L) 05/14/2020   HCT 32.4 (L) 05/14/2020   MCV 95.6 05/14/2020   PLT 91 (L) 05/14/2020     STUDIES: NM PET Image Restag (PS) Skull Base To Thigh  Result Date: 05/10/2020 CLINICAL DATA:  Subsequent treatment strategy for lung cancer including left lower lobe mass and prior right upper lobe primary. Interval radiation therapy of right lower lobe and left lower lobe lesions. EXAM: NUCLEAR MEDICINE PET SKULL BASE TO THIGH TECHNIQUE: 6.1 mCi F-18 FDG was injected intravenously. Full-ring PET imaging was performed from the skull base to thigh after the radiotracer. CT data was obtained and used for attenuation correction and anatomic localization. Fasting blood glucose: 87 mg/dl COMPARISON:  PET-CT of 11/09/2019 and CT chest from 02/17/2020 FINDINGS: Mediastinal blood pool activity: SUV max 2.3 Liver activity: SUV max NA NECK: No significant abnormal hypermetabolic activity in this region. Incidental CT findings: Bilateral common carotid atherosclerotic calcification. CHEST: Interval marked reduction in the left lower lobe para-aortic  mass compared to prior PET-CT and compared to CT chest of 02/17/2020, with the soft tissue thickness in this vicinity measuring 1.1 cm on image 107/3 (formerly about 2.5 cm). Maximum SUV in this region of treated tumor is currently 3.6, previously 15.5. Some of the current activity could be related to local radiation pneumonitis. A band of density in the right upper lobe has similar morphology to prior exams with maximum SUV of 2.1 (formerly 1.9). Airway thickening and mild consolidation at the right lung base worsened from 11/09/2019 but mostly similar to 02/17/2020, with associated airway plugging and a maximum SUV along the region of volume loss and consolidation of 4.9. Active inflammation favored over tumor as a cause, although surveillance is likely warranted. Notable diffuse esophageal activity observed with maximum SUV 4.0, probably incidental/physiologic. No prominent or hypermetabolic subcarinal adenopathy at this time. Incidental CT findings: Small residual right pleural effusion. The left pleural effusion has resolved. Coronary, aortic arch, and branch vessel atherosclerotic vascular disease. Stable descending thoracic aortic aneurysm. Centrilobular emphysema. ABDOMEN/PELVIS: Low-density left adrenal lesion appears stable and favors adenoma based on low density characteristics, maximum SUV 4.2, formerly 2.9. Physiologic activity bowel, most striking in the cecum where there is focal metabolic activity up to 22.2 but no CT correlate or significant abnormality in this vicinity on the prior exam, hence likely incidental. Incidental CT findings: Stable abdominal aortic aneurysm. Upper portion measures up to 4.8 cm transverse. Abdominal aortic atherosclerosis. Sigmoid colon diverticulosis. Suspected pelvic floor laxity. SKELETON: No significant abnormal hypermetabolic activity in this region. Incidental CT findings: Multiple healing right-sided rib fractures including the right fourth seventh, eighth, ninth,  and tenth ribs, first shown on the rib radiographs from 11/28/2019. IMPRESSION: 1. Marked improvement in the left lower lobe mass adjacent to the descending thoracic aorta. Tissue in the vicinity of the mass currently measures only about 1.1 cm in thickness, with maximum SUV of 3.6, previously 15.5. 2. Mild progression in airway thickening, airway plugging, and consolidation in the right lower lobe, favoring an infectious etiology. Maximum SUV 4.9. Surveillance is likely warranted. 3. Reduced right pleural effusion and resolved left pleural effusion. 4. Other imaging findings of potential clinical significance: Aortic Atherosclerosis (ICD10-I70.0). Coronary and carotid atherosclerotic calcification. Left adrenal adenoma. Descending thoracic and abdominal aortic aneurysms. Sigmoid colon diverticulosis. Pelvic floor laxity. Late phase healing of multiple right-sided rib  fractures appear Electronically Signed   By: Van Clines M.D.   On: 05/10/2020 13:16    ASSESSMENT: At least stage IIa left lower lobe lung malignancy.   PLAN:    1.  At least stage IIa left lower lobe lung malignancy: Patient's initial malignancy was in the right upper lobe, therefore it is unclear if this is a second primary or a metastatic lesion. Patient has declined biopsy to confirm diagnosis.  Patient completed treatment with concurrent chemotherapy and XRT on March 09, 2020.  PET scan results from May 10, 2020 reviewed independently and report as above with no obvious evidence of recurrent or progressive disease.  No intervention is needed.  Return to clinic in 3 months for further evaluation.  Will repeat imaging in 6 months with CT scan.   2.  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  PET scan results from Apr 02, 2018 revealed suspected recurrence in subcarinal lymph nodes.  She underwent XRT completing in August 2019.  PET scan results as above.  Unclear if this is a  recurrence or second primary. 3. 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  Oncotype testing was not performed as this would not change the treatment plan.  Patient completed 5 years of letrozole in November 2019.  Her most recent mammogram on October 31, 2019 was reported BI-RADS 1.  Repeat in 1 year. 4.  Osteoporosis: Patient's most recent bone mineral density on October 31, 2019 reported T score of -3.1 which is unchanged from 1 year prior. Patient last received Prolia on October 06, 2019.  Given patient's decreased performance status, Prolia was held and will be given at next clinic visit. 5.  Renal insufficiency: Resolved. 6.  Nausea: Patient does not complain of this today.  Continue current oral antiemetics as prescribed.   7. Anemia: Hemoglobin has trended down to 10.9, monitor. 8.  Atrial fibrillation: Patient is now rate controlled and asymptomatic.  Continue follow-up with cardiology as indicated. 9.  Hyperkalemia: Resolved.   10.  Recurrent UTI: Repeat UA from today is within normal limits.  Urine cultures pending. 11.  Weakness and fatigue, declining performance status: Multifactorial.  Have recommended home health with physical therapy.  Appreciate palliative care input.  Follow-up with them in 1 month.   Patient expressed understanding and was in agreement with this plan. She also understands that She can call clinic at any time with any questions, concerns, or complaints.    Lloyd Huger, MD 05/15/20 6:40 AM

## 2020-05-16 ENCOUNTER — Telehealth: Payer: Self-pay | Admitting: Primary Care

## 2020-05-16 ENCOUNTER — Telehealth: Payer: Self-pay | Admitting: *Deleted

## 2020-05-16 LAB — URINE CULTURE: Culture: NO GROWTH

## 2020-05-16 NOTE — Telephone Encounter (Signed)
U/a looked good. Not sure more abx are needed.

## 2020-05-16 NOTE — Telephone Encounter (Signed)
Gerald Stabs informed of doctor response

## 2020-05-16 NOTE — Telephone Encounter (Signed)
Called listed home & cell number for patient to schedule a Palliative Consult, no answer and unable to leave a message at either number due to no voicemail set up.

## 2020-05-16 NOTE — Telephone Encounter (Signed)
Returned call to daughter, Bonnita Nasuti, and discussed Palliative services with her and answered all questions and she was in agreement with this.  I have scheduled an In-person Consult for 05/22/20 @ 3 PM.

## 2020-05-16 NOTE — Telephone Encounter (Signed)
Daughter Gerald Stabs called asking if patient needs more antibiotics based on whatever the results of her UA C&S are. Please advise

## 2020-05-22 ENCOUNTER — Encounter: Payer: Self-pay | Admitting: Emergency Medicine

## 2020-05-22 ENCOUNTER — Other Ambulatory Visit: Payer: Medicare Other | Admitting: Primary Care

## 2020-05-22 ENCOUNTER — Other Ambulatory Visit: Payer: Self-pay

## 2020-05-22 ENCOUNTER — Telehealth: Payer: Self-pay | Admitting: *Deleted

## 2020-05-22 DIAGNOSIS — C3432 Malignant neoplasm of lower lobe, left bronchus or lung: Secondary | ICD-10-CM

## 2020-05-22 DIAGNOSIS — Z515 Encounter for palliative care: Secondary | ICD-10-CM

## 2020-05-22 NOTE — Telephone Encounter (Signed)
We did not prescribe either medication.  Please discuss with primary care or cardiology

## 2020-05-22 NOTE — Telephone Encounter (Signed)
Mardene Celeste, RN with [facility] called reporting that there is a drug interaction with Diltiazam and Simvastatin. Please advise

## 2020-05-22 NOTE — Telephone Encounter (Signed)
Call returned to [facility] and advised to call patient PCP or cardiologist. She stated she will do that

## 2020-05-22 NOTE — Progress Notes (Signed)
[name] [name] Consult Note Telephone: [phone]  Fax: [phone]  PATIENT NAME: [name] [name] [address] [place] [address] [phone] (home)  DOB: 01-Jul-1928 MRN: [id number]  PRIMARY CARE PROVIDER:    Ezequiel Kayser, MD,  [place] [address] [place]:   [name] Kayser, MD [place] [facility] [place],  [place] [address] [phone]  RESPONSIBLE PARTY:   Extended Emergency Contact Information Primary Emergency Contact: [name],[name] Address: [address]          [place], [place] [address] [place] Phone: [phone] Mobile Phone: [phone] Relation: Daughter  I met with patient and family in home.  ASSESSMENT AND RECOMMENDATIONS:   1. Advance Care Planning/Goals of Care: Goals include to maximize quality of life and symptom management. Our advance care planning conversation included a discussion about:     The value and importance of advance care planning   Experiences with loved ones who have been seriously ill or have died   Exploration of personal, cultural or spiritual beliefs that might influence medical decisions   Exploration of goals of care in the event of a sudden injury or illness   Identification and preparation directions for  a healthcare agent   Creation of an  advance directive document . Chose DNR, limited scope, use of abx, limited use of IIV and feeding tube. POA not done, discussed and Five Wishes given for f/u.  2. Symptom Management:   Skin breakdown: Has some areas of break down on sacrum, foam dressing given and instructed to f/u with Home health RN. [facility]   Pain: Tramadol ordered,  Complaining of increasing  back pain. Recommend adjuvant  Acetaminophen CR 650 mg, q 8 hrs. Education RE treating chronic pain and anticipating.  Nutrition: Poor intake  and sporadic. Taking about 16-32 oz fluid a day. Eating 40-50% of previous amount. Taking supplements daily # 2. Albumin 3.3 g/dl. Small wt loss, < 10 lbs. Poor renal function and has one kidney. Was on decadron which she ran out of now. Has recently refilled which has helped intake. Continue to encourage but also teach about early satiety and dysgeusia.   Mobility: max assist lift to sitting but now has some back pain, more pain on standing. Walks with walker, and stand by assist. [place] in night on screen porch going out to smoke. States  Previously she fell in Feb and broke ribs. PT, OT  is coming in for exercises, from [place].   Caregiving: Someone is home  most times but is alone some times. We discussed medical alert button and family will pursue. Pt felt this would help her well being when alone. Pt was (I) until 3 weeks ago so new caregiving needs with increasing debility, some new forgetfulness.  UTI: Sx were  Incontinence, and extreme weakness. Denies pain, fever. States she is usually continent but uses depends. Finished Augmentin from recent UTI. Had proof of treatment c/s, which is clear. Discussed s/sx not being as pronounced with less robust immune systems.  Disease process: Recent sepsis, PET f/u just done, RTC 4 mos. Family reports significantly increase debility and loss of baseline fx s/p this episode.  3. Family /Caregiver/Community Supports: Lives with family, followed at cancer center.   4. Cognitive / Functional decline: A and O x 2, forgetful. Needs help with most adls and iadls. Was (I) a month ago  with adls.   5. Follow up Palliative Care Visit: Palliative care will continue to follow for goals of care clarification and symptom management. Return 4 weeks or prn.  I spent 75 minutes providing this consultation,  from 1530 to 1645. More than 50% of the time in this consultation was spent coordinating communication.   HISTORY OF PRESENT ILLNESS:  [name] [name] is a 86 y.o. year old female with multiple medical problems including debility, anorexia, lung cancer. Palliative Care was asked to follow this patient by consultation request of [name] Kayser, MD to help address advance care planning and goals of care. This is a follow up visit.  CODE STATUS: DNR  PPS: 50%  HOSPICE ELIGIBILITY/DIAGNOSIS: TBD  PAST MEDICAL HISTORY:  Past Medical History:  Diagnosis Date  . Breast cancer ([place]) 08/19/2012   LT LUMPECTOMY, radiation tx.   Marland Kitchen COPD (chronic obstructive pulmonary disease) ([place])   . Hailey-hailey disease   . Hyperlipemia   . Hypertension   . Lung cancer ([place]) 2014   RT LUNG, radiation tx  . Migraine   . Myocardial infarction ([place])   . Osteoporosis   . Personal history of radiation therapy 2013   left breast ca  . Personal history of radiation therapy 2014   lung ca  . Radiation 2013   FOR BREAST CA  . Radiation 2014   FOR LUNG CA  . Renal artery stenosis ([facility])     SOCIAL HX:  Social History   Tobacco Use  . Smoking status: Current Every Day Smoker    Packs/day: 1.00    Years: 70.00    Pack years: 70.00  . Smokeless tobacco: Never Used  Substance Use Topics  . Alcohol use: No    ALLERGIES:  Allergies  Allergen Reactions  . Bactrim [Sulfamethoxazole-Trimethoprim] Other (See Comments)    Reduced kidney function.  Per Dr. Holley Raring should not take  . Ibuprofen     Reduced kidney function. Per Dr. Holley Raring should not take  . Macrobid [employer] Macro] Other (See Comments)    Reduced Kidney function.  Should no longer take per Dr. Holley Raring  . Tape Rash     PERTINENT MEDICATIONS:  Outpatient Encounter Medications as of 05/22/2020  Medication Sig  . albuterol (PROAIR HFA) 108 (90 Base) MCG/ACT inhaler Inhale 2 puffs into the lungs every 6 (six) hours as needed for wheezing or shortness of breath.   . calcium-vitamin D (OSCAL WITH D) 500-200 MG-UNIT TABS tablet Take 1 tablet by mouth daily.   Marland Kitchen dexamethasone  (DECADRON) 4 MG tablet Take 1 tablet (4 mg total) by mouth daily.  Marland Kitchen diltiazem (CARDIZEM CD) 120 MG 24 hr capsule Take 1 capsule (120 mg total) by mouth daily.  Marland Kitchen ELIQUIS 2.5 MG TABS tablet Take 2.5 mg by mouth 2 (two) times daily.  . feeding supplement, ENSURE ENLIVE, (ENSURE ENLIVE) LIQD Take 237 mLs by mouth 2 (two) times daily between meals.  . furosemide (LASIX) 20 MG tablet Take 0.5 tablets (10 mg total) by mouth daily.  . metoprolol succinate (TOPROL-XL) 25 MG 24 hr tablet Take 25 mg by mouth 2 (two) times daily.   . Multiple Vitamin (MULTIVITAMIN WITH MINERALS) TABS tablet Take 1 tablet by mouth daily.  . Multiple Vitamins-Minerals (PRESERVISION AREDS PO) Take 1 tablet by mouth 2 (two) times daily.  . ondansetron (ZOFRAN) 8 MG tablet Take 1 tablet (8 mg total) by mouth 2 (two) times daily as needed for refractory nausea / vomiting.  . simvastatin (  ZOCOR) 20 MG tablet Take 20 mg by mouth at bedtime.  . traMADol (ULTRAM) 50 MG tablet Take 1 tablet (50 mg total) by mouth every 12 (twelve) hours as needed.  Marland Kitchen amoxicillin-clavulanate (AUGMENTIN) 875-125 MG tablet Take 1 tablet by mouth 2 (two) times daily. (Patient not taking: Reported on 05/22/2020)   Facility-Administered Encounter Medications as of 05/22/2020  Medication  . 0.9 %  sodium chloride infusion    PHYSICAL EXAM / ROS:   Current and past weights: Baseline weight 112 lbs, now 106 lbs.  General: NAD, frail appearing, thin Cardiovascular: S1S2, irreg rhythm, no chest pain reported, no  LE edema  today Pulmonary: Lungs with scattered rales,  no cough, no increased SOB, room air, daily smoker x 80 pack years. Abdomen: appetite fair, occ  constipation, continent of bowel GU: denies dysuria, continent of urine, one kidney, recent UTI MSK:  + joint and ROM abnormalities, ambulatory with walker and stand by Skin: has buttock break down, dermatitis vulgaris at times Neurological: increased Weakness,  Pain from sitting, due to break  down. Endorses peripheral neuropathies. Long standing insomnia, napping a bit during the day.   Jason Coop, NP [facility]  COVID-19 PATIENT SCREENING TOOL  Person answering questions: __________family______ _____   1.  Is the patient or any family member in the home showing any signs or symptoms regarding respiratory infection?               Person with Symptom- __________NA_________________  a. Fever                                                                          Yes___ No___          ___________________  b. Shortness of breath                                                    Yes___ No___          ___________________ c. Cough/congestion                                       Yes___  No___         ___________________ d. Body aches/pains                                                         Yes___ No___        ____________________ e. Gastrointestinal symptoms (diarrhea, nausea)           Yes___ No___        ____________________  2. Within the past 14 days, has anyone living in the home had any contact with someone with or under investigation for COVID-19?    Yes___ No_X_   Person __________________

## 2020-05-28 ENCOUNTER — Telehealth: Payer: Self-pay | Admitting: *Deleted

## 2020-05-28 NOTE — Telephone Encounter (Signed)
VERBAL ORDER given for approval of PT 2 week 3, 1 week 1

## 2020-06-06 ENCOUNTER — Emergency Department: Payer: Medicare Other

## 2020-06-06 ENCOUNTER — Observation Stay
Admission: EM | Admit: 2020-06-06 | Discharge: 2020-06-08 | Disposition: A | Discharge status: Home / Self Care | Payer: Medicare Other | Attending: Hospitalist | Admitting: Hospitalist

## 2020-06-06 ENCOUNTER — Other Ambulatory Visit: Payer: Self-pay

## 2020-06-06 DIAGNOSIS — Z853 Personal history of malignant neoplasm of breast: Secondary | ICD-10-CM | POA: Diagnosis not present

## 2020-06-06 DIAGNOSIS — I4891 Unspecified atrial fibrillation: Secondary | ICD-10-CM | POA: Insufficient documentation

## 2020-06-06 DIAGNOSIS — M7989 Other specified soft tissue disorders: Secondary | ICD-10-CM | POA: Insufficient documentation

## 2020-06-06 DIAGNOSIS — E872 Acidosis, unspecified: Secondary | ICD-10-CM

## 2020-06-06 DIAGNOSIS — Z923 Personal history of irradiation: Secondary | ICD-10-CM | POA: Diagnosis not present

## 2020-06-06 DIAGNOSIS — D72829 Elevated white blood cell count, unspecified: Secondary | ICD-10-CM | POA: Insufficient documentation

## 2020-06-06 DIAGNOSIS — Z79899 Other long term (current) drug therapy: Secondary | ICD-10-CM | POA: Diagnosis not present

## 2020-06-06 DIAGNOSIS — Z7901 Long term (current) use of anticoagulants: Secondary | ICD-10-CM | POA: Insufficient documentation

## 2020-06-06 DIAGNOSIS — J449 Chronic obstructive pulmonary disease, unspecified: Secondary | ICD-10-CM | POA: Diagnosis not present

## 2020-06-06 DIAGNOSIS — R109 Unspecified abdominal pain: Secondary | ICD-10-CM | POA: Diagnosis not present

## 2020-06-06 DIAGNOSIS — F32A Depression, unspecified: Secondary | ICD-10-CM | POA: Diagnosis present

## 2020-06-06 DIAGNOSIS — R531 Weakness: Principal | ICD-10-CM | POA: Insufficient documentation

## 2020-06-06 DIAGNOSIS — I130 Hypertensive heart and chronic kidney disease with heart failure and stage 1 through stage 4 chronic kidney disease, or unspecified chronic kidney disease: Secondary | ICD-10-CM | POA: Insufficient documentation

## 2020-06-06 DIAGNOSIS — Z85118 Personal history of other malignant neoplasm of bronchus and lung: Secondary | ICD-10-CM | POA: Insufficient documentation

## 2020-06-06 DIAGNOSIS — I252 Old myocardial infarction: Secondary | ICD-10-CM | POA: Diagnosis not present

## 2020-06-06 DIAGNOSIS — I10 Essential (primary) hypertension: Secondary | ICD-10-CM | POA: Diagnosis present

## 2020-06-06 DIAGNOSIS — I509 Heart failure, unspecified: Secondary | ICD-10-CM | POA: Diagnosis not present

## 2020-06-06 DIAGNOSIS — C3432 Malignant neoplasm of lower lobe, left bronchus or lung: Secondary | ICD-10-CM | POA: Diagnosis present

## 2020-06-06 DIAGNOSIS — N183 Chronic kidney disease, stage 3 unspecified: Secondary | ICD-10-CM | POA: Diagnosis not present

## 2020-06-06 DIAGNOSIS — Z9221 Personal history of antineoplastic chemotherapy: Secondary | ICD-10-CM | POA: Diagnosis not present

## 2020-06-06 DIAGNOSIS — F329 Major depressive disorder, single episode, unspecified: Secondary | ICD-10-CM | POA: Insufficient documentation

## 2020-06-06 DIAGNOSIS — R7402 Elevation of levels of lactic acid dehydrogenase (LDH): Secondary | ICD-10-CM | POA: Insufficient documentation

## 2020-06-06 DIAGNOSIS — L8990 Pressure ulcer of unspecified site, unspecified stage: Secondary | ICD-10-CM | POA: Insufficient documentation

## 2020-06-06 HISTORY — DX: Disorder of kidney and ureter, unspecified: N28.9

## 2020-06-06 LAB — URINALYSIS, COMPLETE (UACMP) WITH MICROSCOPIC
Bacteria, UA: NONE SEEN
Bilirubin Urine: NEGATIVE
Glucose, UA: NEGATIVE mg/dL
Hgb urine dipstick: NEGATIVE
Ketones, ur: NEGATIVE mg/dL
Leukocytes,Ua: NEGATIVE
Nitrite: NEGATIVE
Protein, ur: NEGATIVE mg/dL
Specific Gravity, Urine: 1.009 (ref 1.005–1.030)
Squamous Epithelial / HPF: NONE SEEN (ref 0–5)
WBC, UA: NONE SEEN WBC/hpf (ref 0–5)
pH: 6 (ref 5.0–8.0)

## 2020-06-06 LAB — COMPREHENSIVE METABOLIC PANEL
ALT: 42 U/L (ref 0–44)
AST: 38 U/L (ref 15–41)
Albumin: 3.4 g/dL — ABNORMAL LOW (ref 3.5–5.0)
Alkaline Phosphatase: 79 U/L (ref 38–126)
Anion gap: 11 (ref 5–15)
BUN: 67 mg/dL — ABNORMAL HIGH (ref 8–23)
CO2: 29 mmol/L (ref 22–32)
Calcium: 9.1 mg/dL (ref 8.9–10.3)
Chloride: 103 mmol/L (ref 98–111)
Creatinine, Ser: 1.12 mg/dL — ABNORMAL HIGH (ref 0.44–1.00)
GFR calc Af Amer: 50 mL/min — ABNORMAL LOW (ref >60)
GFR calc non Af Amer: 43 mL/min — ABNORMAL LOW (ref >60)
Glucose, Bld: 83 mg/dL (ref 70–99)
Potassium: 5 mmol/L (ref 3.5–5.1)
Sodium: 143 mmol/L (ref 135–145)
Total Bilirubin: 1.1 mg/dL (ref 0.3–1.2)
Total Protein: 6.2 g/dL — ABNORMAL LOW (ref 6.5–8.1)

## 2020-06-06 LAB — CBC WITH DIFFERENTIAL/PLATELET
Abs Immature Granulocytes: 0.54 10*3/uL — ABNORMAL HIGH (ref 0.00–0.07)
Basophils Absolute: 0.1 10*3/uL (ref 0.0–0.1)
Basophils Relative: 0 %
Eosinophils Absolute: 0 10*3/uL (ref 0.0–0.5)
Eosinophils Relative: 0 %
HCT: 37.7 % (ref 36.0–46.0)
Hemoglobin: 11.8 g/dL — ABNORMAL LOW (ref 12.0–15.0)
Immature Granulocytes: 3 %
Lymphocytes Relative: 9 %
Lymphs Abs: 1.6 10*3/uL (ref 0.7–4.0)
MCH: 33.6 pg (ref 26.0–34.0)
MCHC: 31.3 g/dL (ref 30.0–36.0)
MCV: 107.4 fL — ABNORMAL HIGH (ref 80.0–100.0)
Monocytes Absolute: 1.2 10*3/uL — ABNORMAL HIGH (ref 0.1–1.0)
Monocytes Relative: 7 %
Neutro Abs: 14.1 10*3/uL — ABNORMAL HIGH (ref 1.7–7.7)
Neutrophils Relative %: 81 %
Platelets: 169 10*3/uL (ref 150–400)
RBC: 3.51 MIL/uL — ABNORMAL LOW (ref 3.87–5.11)
RDW: 18.6 % — ABNORMAL HIGH (ref 11.5–15.5)
WBC: 17.4 10*3/uL — ABNORMAL HIGH (ref 4.0–10.5)
nRBC: 0.1 % (ref 0.0–0.2)

## 2020-06-06 LAB — LACTIC ACID, PLASMA
Lactic Acid, Venous: 2 mmol/L (ref 0.5–1.9)
Lactic Acid, Venous: 2.2 mmol/L (ref 0.5–1.9)

## 2020-06-06 MED ORDER — BOOST HIGH PROTEIN PO LIQD
1.0000 | Freq: Three times a day (TID) | ORAL | Status: DC
  Administered 2020-06-06 – 2020-06-08 (×4): 237 mL via ORAL
  Filled 2020-06-06: qty 237

## 2020-06-06 MED ORDER — METOPROLOL SUCCINATE ER 25 MG PO TB24
25.0000 mg | ORAL_TABLET | Freq: Two times a day (BID) | ORAL | Status: DC
  Administered 2020-06-06 – 2020-06-08 (×4): 25 mg via ORAL
  Filled 2020-06-06 (×4): qty 1

## 2020-06-06 MED ORDER — CLOTRIMAZOLE 10 MG MT TROC
10.0000 mg | Freq: Every day | OROMUCOSAL | Status: DC
  Administered 2020-06-06 – 2020-06-08 (×7): 10 mg via ORAL
  Filled 2020-06-06 (×11): qty 1

## 2020-06-06 MED ORDER — DILTIAZEM HCL ER COATED BEADS 120 MG PO CP24
120.0000 mg | ORAL_CAPSULE | Freq: Every day | ORAL | Status: DC
  Administered 2020-06-07 – 2020-06-08 (×2): 120 mg via ORAL
  Filled 2020-06-06 (×2): qty 1

## 2020-06-06 MED ORDER — SIMVASTATIN 20 MG PO TABS
20.0000 mg | ORAL_TABLET | Freq: Every day | ORAL | Status: DC
  Administered 2020-06-06: 21:00:00 20 mg via ORAL
  Filled 2020-06-06: qty 1

## 2020-06-06 MED ORDER — ONDANSETRON HCL 4 MG PO TABS: 4.0000 mg | ORAL_TABLET | Freq: Four times a day (QID) | ORAL | Status: DC | PRN

## 2020-06-06 MED ORDER — MIRTAZAPINE 15 MG PO TABS
7.5000 mg | ORAL_TABLET | Freq: Every day | ORAL | Status: DC
  Administered 2020-06-06 – 2020-06-07 (×2): 7.5 mg via ORAL
  Filled 2020-06-06 (×2): qty 1

## 2020-06-06 MED ORDER — ADULT MULTIVITAMIN W/MINERALS CH
1.0000 | ORAL_TABLET | Freq: Every day | ORAL | Status: DC
  Administered 2020-06-07 – 2020-06-08 (×2): 1 via ORAL
  Filled 2020-06-06 (×2): qty 1

## 2020-06-06 MED ORDER — LISINOPRIL 5 MG PO TABS
2.5000 mg | ORAL_TABLET | Freq: Every day | ORAL | Status: DC
  Administered 2020-06-06 – 2020-06-07 (×2): 2.5 mg via ORAL
  Filled 2020-06-06 (×2): qty 1

## 2020-06-06 MED ORDER — OCUVITE-LUTEIN PO CAPS
1.0000 | ORAL_CAPSULE | Freq: Two times a day (BID) | ORAL | Status: DC
  Administered 2020-06-07 – 2020-06-08 (×3): 1 via ORAL
  Filled 2020-06-06 (×6): qty 1

## 2020-06-06 MED ORDER — TRAMADOL HCL 50 MG PO TABS
50.0000 mg | ORAL_TABLET | Freq: Two times a day (BID) | ORAL | Status: DC | PRN
  Administered 2020-06-07: 50 mg via ORAL
  Filled 2020-06-06: qty 1

## 2020-06-06 MED ORDER — CALCIPOTRIENE-BETAMETH DIPROP 0.005-0.064 % EX OINT: 1.0000 "application " | TOPICAL_OINTMENT | Freq: Every day | CUTANEOUS | Status: DC

## 2020-06-06 MED ORDER — APIXABAN 2.5 MG PO TABS
2.5000 mg | ORAL_TABLET | Freq: Two times a day (BID) | ORAL | Status: DC
  Administered 2020-06-06 – 2020-06-08 (×4): 2.5 mg via ORAL
  Filled 2020-06-06 (×5): qty 1

## 2020-06-06 MED ORDER — SODIUM CHLORIDE 0.9 % IV SOLN: INTRAVENOUS | Status: DC

## 2020-06-06 MED ORDER — DEXAMETHASONE 4 MG PO TABS
4.0000 mg | ORAL_TABLET | Freq: Every day | ORAL | Status: DC
  Administered 2020-06-07 – 2020-06-08 (×2): 4 mg via ORAL
  Filled 2020-06-06 (×2): qty 1

## 2020-06-06 MED ORDER — CLOTRIMAZOLE 10 MG MT TROC
10.0000 mg | Freq: Every day | OROMUCOSAL | Status: DC
  Filled 2020-06-06 (×5): qty 1

## 2020-06-06 MED ORDER — ACETAMINOPHEN 650 MG RE SUPP: 650.0000 mg | Freq: Four times a day (QID) | RECTAL | Status: DC | PRN

## 2020-06-06 MED ORDER — DILTIAZEM HCL 60 MG PO TABS
30.0000 mg | ORAL_TABLET | Freq: Once | ORAL | Status: AC
  Administered 2020-06-06: 30 mg via ORAL
  Filled 2020-06-06: qty 1

## 2020-06-06 MED ORDER — ACETAMINOPHEN 325 MG PO TABS
650.0000 mg | ORAL_TABLET | Freq: Four times a day (QID) | ORAL | Status: DC | PRN
  Administered 2020-06-07: 650 mg via ORAL
  Filled 2020-06-06: qty 2

## 2020-06-06 MED ORDER — ONDANSETRON HCL 4 MG/2ML IJ SOLN: 4.0000 mg | Freq: Four times a day (QID) | INTRAMUSCULAR | Status: DC | PRN

## 2020-06-06 MED ORDER — IOHEXOL 300 MG/ML  SOLN
60.0000 mL | Freq: Once | INTRAMUSCULAR | Status: AC | PRN
  Administered 2020-06-06: 60 mL via INTRAVENOUS

## 2020-06-06 MED ORDER — LACTATED RINGERS IV BOLUS
500.0000 mL | Freq: Once | INTRAVENOUS | Status: AC
  Administered 2020-06-06: 500 mL via INTRAVENOUS

## 2020-06-06 MED ORDER — FUROSEMIDE 20 MG PO TABS
10.0000 mg | ORAL_TABLET | Freq: Every day | ORAL | Status: DC
  Administered 2020-06-07: 10:00:00 10 mg via ORAL
  Filled 2020-06-06: qty 1

## 2020-06-06 NOTE — ED Provider Notes (Signed)
[facility] Emergency Department Provider Note ____________________________________________   First MD Initiated Contact with Patient 06/06/20 [phone]     (approximate)  I have reviewed the triage vital signs and the nursing notes.  HISTORY  Chief Complaint Weakness  HPI [name] [name] [name] [name] is a 86 y.o. female presents to the ED with her daughter for generalized weakness.    History of cigarette smoking, CAD, breast and lung cancer, COPD, HTN, HLD.  Follows with palliative care.  DNR status.  Status post radiation and lumpectomy for breast cancer, chemotherapy and radiation 4 months ago for lung cancer   Patient presents with daughter, who contributes to history.  Patient also lives with daughter who is her primary caregiver and an EMT. Daughter is primarily concerned about patient's downward trend over the past 2 months since acquiring a UTI in early June, which was successfully treated with 2 courses of antibiotics finishing with Augmentin.  6/14 urine culture with E. coli and 6/22 culture with E. coli and Pseudomonas. Daughter reports patient's symptoms of generalized weakness improved after treatment with Augmentin and patient was "pretty much back to normal" as of 1-2 weeks ago.  Daughter now reports re- worsening of generalized weakness over the past 2-3 days reminiscent of last month.  Patient and daughter deny fevers, increased cough, productive cough, chest pain, syncope.  They do report a mechanical fall 2 days ago where patient slipped while bending over to pick something up, denies any significant trauma besides a small skin tear to her left elbow.  No syncope.   Past Medical History:  Diagnosis Date  . Breast cancer ([place]) 08/19/2012   LT LUMPECTOMY, radiation tx.   Marland Kitchen COPD (chronic obstructive pulmonary disease) ([place])   . Hailey-hailey disease   . Hyperlipemia   . Hypertension   . Lung cancer ([place]) 2014   RT LUNG, radiation tx  . Migraine    . Myocardial infarction ([place])   . Osteoporosis   . Personal history of radiation therapy 2013   left breast ca  . Personal history of radiation therapy 2014   lung ca  . Radiation 2013   FOR BREAST CA  . Radiation 2014   FOR LUNG CA  . Renal artery stenosis ([place])   . Renal insufficiency     Patient Active Problem List   Diagnosis Date Noted  . Generalized weakness 06/06/2020  . Depression 06/06/2020  . S/P thoracentesis   . Malignant neoplasm of lower lobe of left lung ([place])   . Rapid atrial fibrillation ([place]) 02/17/2020  . Acute CHF (congestive heart failure) ([place]) 02/17/2020  . Emphysema lung ([place]) 02/17/2020  . Goals of care, counseling/discussion 01/13/2020  . Mass of lower lobe of left lung 12/22/2019  . 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 Acute kidney failure ([place]) 10/18/2019  . Proteinuria 10/18/2019  . Chronic left-sided thoracic back pain 09/08/2019  . Secondary hyperparathyroidism of renal origin ([place]) 01/26/2019  . Pain in toes of both feet 04/14/2018  . Bradycardia 08/14/2016  . Primary cancer of lower-inner quadrant of left female breast ([place]) 07/17/2016  . Chronic low back pain 06/18/2016  . Combined fat and carbohydrate induced hyperlipemia 01/25/2016  . Myocardial infarction ([place]) 01/25/2016  . Current tobacco use 01/25/2016  . Chronic use of opiate drug for therapeutic purpose 01/17/2016  . Hyperkalemia 12/21/2015  . Dizziness 07/25/2015  . Immunizations incomplete 03/04/2015  . MI (mitral incompetence) 12/20/2014  . Simple chronic bronchitis ([place]) 12/15/2014  . Vasomotor rhinitis 12/15/2014  .  Coronary artery disease of native artery of native heart with stable angina pectoris ([place]) 12/12/2014  . TI (tricuspid incompetence) 12/12/2014  . Breathlessness on exertion 12/12/2014  . Stage 3 chronic kidney disease 06/15/2014  . Benign essential hypertension 06/15/2014  . Primary cancer of right upper lobe of lung ([place]) 06/15/2014  . Osteoporosis,  post-menopausal 06/15/2014  . Peripheral vascular disease ([place]) 06/15/2014  . Renal artery stenosis ([place]) 06/15/2014  . History of left breast cancer 11/18/2011  . History of myocardial infarction 05/17/2001    Past Surgical History:  Procedure Laterality Date  . ABDOMINAL HYSTERECTOMY    . APPENDECTOMY    . BREAST BIOPSY Left 08/03/2012   invasive mammary carcinoma, u/s guided bx  . 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 cardiac stents    . PARATHYROIDECTOMY    . TONSILLECTOMY      Prior to Admission medications   Medication Sig Start Date End Date Taking? Authorizing Provider  clotrimazole (MYCELEX) 10 MG troche Take 10 mg by mouth 5 (five) times daily. 06/04/20  Yes [provider]  dexamethasone (DECADRON) 4 MG tablet Take 1 tablet (4 mg total) by mouth daily. 05/14/20  Yes Lloyd Huger, MD  diltiazem (CARDIZEM CD) 120 MG 24 hr capsule Take 1 capsule (120 mg total) by mouth daily. 02/21/20  Yes Fritzi Mandes, MD  ELIQUIS 2.5 MG TABS tablet Take 2.5 mg by mouth 2 (two) times daily. 01/24/20  Yes [provider]  feeding supplement (BOOST HIGH PROTEIN) LIQD Take 1 Container by mouth 3 (three) times daily between meals.   Yes [provider]  furosemide (LASIX) 20 MG tablet Take 0.5 tablets (10 mg total) by mouth daily. 02/21/20  Yes Fritzi Mandes, MD  lisinopril (ZESTRIL) 2.5 MG tablet Take 2.5 mg by mouth at bedtime.   Yes [provider]  metoprolol succinate (TOPROL-XL) 25 MG 24 hr tablet Take 25 mg by mouth 2 (two) times daily.  01/24/20  Yes [provider]  mirtazapine (REMERON) 7.5 MG tablet Take 7.5 mg by mouth at bedtime. 06/04/20  Yes [provider]  Multiple Vitamin (MULTIVITAMIN WITH MINERALS) TABS tablet Take 1 tablet by mouth daily.   Yes [provider]  Multiple Vitamins-Minerals (PRESERVISION AREDS PO) Take 1 tablet by mouth 2 (two) times daily.   Yes  [provider]  ondansetron (ZOFRAN) 8 MG tablet Take 1 tablet (8 mg total) by mouth 2 (two) times daily as needed for refractory nausea / vomiting. 01/06/20  Yes Lloyd Huger, MD  simvastatin (ZOCOR) 20 MG tablet Take 20 mg by mouth at bedtime. 12/22/19  Yes [provider]  traMADol (ULTRAM) 50 MG tablet Take 1 tablet (50 mg total) by mouth every 12 (twelve) hours as needed. 11/28/19  Yes Sable Feil, PA-C  albuterol [facility] HFA) 108 (90 Base) MCG/ACT inhaler Inhale 2 puffs into the lungs every 6 (six) hours as needed for wheezing or shortness of breath.  08/15/15   [provider]  calcipotriene-betamethasone (TACLONEX) ointment Apply 1 application topically daily. To legs and back as needed    [provider]  Calcium Carb-Cholecalciferol (OYSTER SHELL CALCIUM) 500-400 MG-UNIT TABS Take by mouth. Patient not taking: Reported on 06/06/2020    [provider]    Allergies Bactrim [sulfamethoxazole-trimethoprim], Ibuprofen, Macrobid [nitrofurantoin monohyd macro], and Tape  Family History  Problem Relation Age of Onset  . Breast cancer Sister 49    Social  History Social History   Tobacco Use  . Smoking status: Current Every Day Smoker    Packs/day: 1.00    Years: 70.00    Pack years: 70.00  . Smokeless tobacco: Never Used  Vaping Use  . Vaping Use: Never used  Substance Use Topics  . Alcohol use: No  . Drug use: No    Review of Systems  Constitutional: No fever/chills.  Positive for generalized weakness Eyes: No visual changes. ENT: No sore throat. Cardiovascular: Denies chest pain. Respiratory: Denies shortness of breath. Gastrointestinal: No abdominal pain.  No nausea, no vomiting.  No diarrhea.  No constipation. Genitourinary: Negative for dysuria. Musculoskeletal: Negative for back pain. Skin: Negative for rash. Neurological: Negative for headaches, focal weakness or  numbness.   ____________________________________________   PHYSICAL EXAM:  VITAL SIGNS: Today's Vitals   06/06/20 0730 06/06/20 0748 06/06/20 0749 06/06/20 1042  BP: (!) 138/93     Pulse:   (!) 117   Resp: (!) 25  (!) 21   Temp:  98.2 F (36.8 C)    TempSrc:  Oral    SpO2:   100%   Weight: 51.4 kg     Height: 5\' 3"  (1.6 m)     PainSc: 4    0-No pain   Body mass index is 20.07 kg/m.   Constitutional: Alert and oriented. Well appearing and in no acute distress.  Sitting up in bed and conversational in full sentences, pleasant.  Denies complaints at this time. Eyes: Conjunctivae are normal. PERRL. EOMI. Head: Atraumatic. Nose: No congestion/rhinnorhea. Mouth/Throat: Mucous membranes are moist.  Oropharynx non-erythematous. Neck: No stridor. No cervical spine tenderness to palpation. Cardiovascular: Tachycardic and irregularly irregular.. Grossly normal heart sounds.  Good peripheral circulation. Respiratory: Normal respiratory effort.  No retractions.  Decreased breath sounds in the right base, otherwise clear to auscultation. Gastrointestinal: Soft , nondistended, nontender to palpation. No abdominal bruits. No CVA tenderness. Musculoskeletal: No lower extremity tenderness nor edema.  No joint effusions. No signs of acute trauma. Neurologic:  Normal speech and language. No gross focal neurologic deficits are appreciated. No gait instability noted. Skin:  Skin is warm, dry and intact. No rash noted. Psychiatric: Mood and affect are normal. Speech and behavior are normal.  ____________________________________________   LABS (all labs ordered are listed, but only abnormal results are displayed)  Labs Reviewed  CBC WITH DIFFERENTIAL/PLATELET - Abnormal; Notable for the following components:      Result Value   WBC 17.4 (*)    RBC 3.51 (*)    Hemoglobin 11.8 (*)    MCV 107.4 (*)    RDW 18.6 (*)    Neutro Abs 14.1 (*)    Monocytes Absolute 1.2 (*)    Abs Immature  Granulocytes 0.54 (*)    All other components within normal limits  COMPREHENSIVE METABOLIC PANEL - Abnormal; Notable for the following components:   BUN 67 (*)    Creatinine, Ser 1.12 (*)    Total Protein 6.2 (*)    Albumin 3.4 (*)    GFR calc non Af Amer 43 (*)    GFR calc Af Amer 50 (*)    All other components within normal limits  URINALYSIS, COMPLETE (UACMP) WITH MICROSCOPIC - Abnormal; Notable for the following components:   Color, Urine YELLOW (*)    APPearance CLEAR (*)    All other components within normal limits  LACTIC ACID, PLASMA - Abnormal; Notable for the following components:   Lactic Acid, Venous 2.0 (*)  All other components within normal limits  LACTIC ACID, PLASMA - Abnormal; Notable for the following components:   Lactic Acid, Venous 2.2 (*)    All other components within normal limits  URINE CULTURE   ____________________________________________  12 Lead EKG Twelve-lead EKG demonstrates atrial fibrillation with a rate of 124 bpm, normal axis, prolonged QTC of 598 and no evidence of acute ischemia.  ____________________________________________  RADIOLOGY  ED MD interpretation: CXR obtained due to concern for possible pneumonia or intrathoracic pathology causing her weakness and leukocytosis, results reviewed and remarkable for no evidence of acute cardiopulmonary pathology  Official radiology report(s): DG Chest 2 View  Result Date: 06/06/2020 CLINICAL DATA:  Lung cancer.  Atrial fibrillation.  Weakness. EXAM: CHEST - 2 VIEW COMPARISON:  04/06/2020 FINDINGS: Heart size and vascularity normal. Atherosclerotic calcification in the thoracic aorta. COPD with hyperinflation. Right upper lobe linear density unchanged. Patchy airspace disease in the right lung base shows interval improvement. Left lung clear. No new area of infiltrate or effusion. Right coronary artery stent. IMPRESSION: COPD. Improvement in right lower lobe airspace disease. No new area of  pneumonia. Electronically Signed   By: Franchot Gallo M.D.   On: 06/06/2020 08:37   CT Head Wo Contrast  Result Date: 06/06/2020 CLINICAL DATA:  Head trauma, minor; fall on Eliquis, evaluate for ICH. EXAM: CT HEAD WITHOUT CONTRAST TECHNIQUE: Contiguous axial images were obtained from the base of the skull through the vertex without intravenous contrast. COMPARISON:  Head CT 03/24/2005 FINDINGS: Brain: Moderate generalized parenchymal atrophy, progressed as compared to the head CT of 03/23/2005. Moderate patchy hypoattenuation within the cerebral white matter, also progressed as compared to the prior CT. Small left parietal lobe chronic white matter infarct. There is no acute intracranial hemorrhage. No demarcated cortical infarct. No extra-axial fluid collection. No evidence of intracranial mass. No midline shift. Vascular: No hyperdense vessel.  Atherosclerotic calcifications. Skull: Normal. Negative for fracture or focal lesion. Sinuses/Orbits: Visualized orbits show no acute finding. Mild scattered paranasal sinus mucosal thickening at the imaged levels. Small left sphenoid sinus mucous retention cyst. No significant mastoid effusion at the imaged levels. IMPRESSION: No evidence of acute intracranial abnormality. Moderate generalized parenchymal atrophy and chronic small vessel ischemic disease, progressed as compared to the prior head CT of 03/23/2005. Mild paranasal sinus mucosal thickening. Small left sphenoid sinus mucous retention cyst. Electronically Signed   By: Kellie Simmering DO   On: 06/06/2020 08:51   CT ABDOMEN PELVIS W CONTRAST  Result Date: 06/06/2020 CLINICAL DATA:  Abdominal pain, acute with RIGHT upper quadrant and LEFT lower quadrant pain. EXAM: CT ABDOMEN AND PELVIS WITH CONTRAST TECHNIQUE: Multidetector CT imaging of the abdomen and pelvis was performed using the standard protocol following bolus administration of intravenous contrast. CONTRAST:  80mL OMNIPAQUE IOHEXOL 300 MG/ML  SOLN  COMPARISON:  November 01, 2019 FINDINGS: Lower chest: Signs of cylindrical bronchiectasis and inspissated material within RIGHT lower lobe bronchi. Overlying the RIGHT hemidiaphragm is an area of nodularity measuring 2.3 x 1.4 cm this has not changed significantly in the interval since December of 2020, perhaps even slightly smaller though position of the RIGHT hemidiaphragm could change appearance. Marked dilation of the distal thoracic aorta with tortuosity and eccentric plaque measuring approximately 4.2 cm greatest axial dimension similar to November of 2019. Due to tortuosity measurements could be exaggerated. Measurement in long axis is 5.1 cm. This is unchanged and likely exaggerated by the aortic tortuosity at this level in the lower chest. Signs of prior percutaneous coronary  intervention. No pleural effusion. Pulmonary emphysema. Hepatobiliary: Mildly lobular hepatic contours. No focal, suspicious hepatic lesion. No biliary duct dilation. No pericholecystic stranding. Pancreas: 1.9 x 1.8 cm cystic lesion in the head of the pancreas (image 24, series 2) no main duct dilation or peripancreatic inflammation. Spleen: Spleen normal in size and contour. Adrenals/Urinary Tract: LEFT adrenal with enlargement and calcification measuring approximately 2.0 x 1.8 cm in greatest axial dimension. Given position of adrenal with inspiratory effort this could be altered but it appears that it may be smaller, when measured in a similar location previously it measured approximately 2.5 x 1.8 cm, measuring approximately 1.5 cm greatest thickness on today's study more superiorly near the calcification previously approximately 1.9 cm in a similar location. The RIGHT adrenal is normal. Urinary bladder is distended. No hydronephrosis. Renal cortical scarring. Small cysts in the kidneys. Stomach/Bowel: No acute gastrointestinal process. Colonic diverticulosis. Vascular/Lymphatic: Vascular disease with infrarenal abdominal aortic  aneurysm measuring approximately 4.8 x 3.8 cm just below the renal arteries. The aortic tortuosity showing a similar appearance. Tortuosity exaggerating luminal size, appearance similar to the previous exam with large amount of eccentric mural thrombus/soft plaque along the RIGHT lateral wall at the area of greatest dilation. 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 No periaortic stranding. Patent visceral branches despite severe atheromatous changes. No adenopathy in the upper abdomen or in the retroperitoneum. Small retrocrural lymph nodes are stable. No pelvic adenopathy. Reproductive: Post hysterectomy with signs of pelvic floor dysfunction with bowing of pelvic floor musculature. Other: No ascites.  No free air. Musculoskeletal: No acute bone finding. No destructive bone process. IMPRESSION: No CT signs of acute abnormality in the abdomen or pelvis post infectious changes in the RIGHT lower lobe along with a more focal nodular area at the RIGHT lung base above the RIGHT hemidiaphragm potentially post infectious scarring. Given patient history would suggest close attention on follow-up. Thoracic and abdominal aneurysmal dilation and severe vascular disease with tortuosity as described. Findings not changed since prior imaging. Stable cystic pancreatic lesion, attention on follow-up. Electronically Signed   By: Zetta Bills M.D.   On: 06/06/2020 09:47    ____________________________________________   PROCEDURES  Procedure(s) performed (including Critical Care):  .1-3 Lead EKG Interpretation Performed by: Vladimir Crofts, MD Authorized by: Vladimir Crofts, MD     Interpretation: abnormal     ECG rate assessment: tachycardic     Rhythm: atrial fibrillation     Ectopy: none     Conduction: normal        ____________________________________________   INITIAL IMPRESSION / ASSESSMENT AND PLAN / ED COURSE  Pleasant and functional 86 year old woman presenting from home with generalized weakness of uncertain etiology, found to have lactic acidosis and requiring medical observation admission.  Patient remains in A. fib, initially rapid rates in the 120s-130s resolved after single dose of p.o. diltiazem, vitals otherwise normal.  Reassuring exam with pleasant patient denies complaints besides generalized weakness.  Blood work with lactic acidosis and leukocytosis of uncertain etiology.  Provided small fluid bolus and p.o. fluids with worsening of her lactic acidosis.  In review of her chart, she has had multiple UTIs with clear UAs with a culture returning with either E. coli or Pseudomonas.  Due to her worsening serum markers despite fluid resuscitation, I am concerned of the possibility of an early or latent UTI or other infection.  Will admit the patient to hospitalist medicine for further work-up and management.   Clinical Course as of Jun 06 1556  Wed Jun 06, 2020  0823 Daughter comes to speak with me at my workstation reinforcing that this is different from normal and that she is concerned about her mother   [DS]  [phone] Leukocytosis and lactic acidosis noted, LR bolus ordered   [DS]  0826 2 view CXR reviewed by me without evidence of effusion or consolidation   [DS]  0845 Reassessed the patient and informed patient/daughter reassuring UA, but blood work with concerning derangements.  Oral diltiazem has been provided and patient remains tachycardic 100-130.   [DS]  62 Reassessed patient and daughter, updating on reassuring CT without acute features.  We reviewed overall work-up consistent with lactic acidosis and mild leukocytosis.  Patient continues to be adamant that she feels well, has no complaints and would like to go home. Shared decision making with daughter and patient on  observation admission versus lactic acid recheck with ambulation trial and possible discharge home, daughter and patient are agreeable and prefer to recheck lactic acid after IVF and p.o. fluids, attempted ambulation trial and if all goes well to go home with PCP follow-up.  I think this is reasonable as there is no evidence of acute pathology beyond her laboratory derangements   [DS]    Clinical Course User Index [DS] Vladimir Crofts, MD     ____________________________________________   FINAL CLINICAL IMPRESSION(S) / ED DIAGNOSES  Final diagnoses:  Generalized weakness  Lactic acidosis  Atrial fibrillation with RVR [facility])     ED Discharge Orders    None       Lequita Meadowcroft   Note:  This document was prepared using Dragon voice recognition software and may include unintentional dictation errors.   Vladimir Crofts, MD 06/06/20 [phone]

## 2020-06-06 NOTE — TOC Initial Note (Signed)
Transition of Care [facility]) - Initial/Assessment Note    Patient Details  Name: [name] [name] MRN: [id number] Date of Birth: November 18, 1927  Transition of Care [facility]) CM/SW Contact:    Anselm Pancoast, RN Phone Number: 06/06/2020, 12:39 PM  Clinical Narrative:                 Notified by Corene Cornea @ [place] patient active with RN, PT and OT.         Patient Goals and CMS Choice        Expected Discharge Plan and Services                                                Prior Living Arrangements/Services                       Activities of Daily Living      Permission Sought/Granted                  Emotional Assessment              Admission diagnosis:  Generalized weakness [R53.1] Patient Active Problem List   Diagnosis Date Noted   Generalized weakness 06/06/2020   S/P thoracentesis    Malignant neoplasm of lower lobe of left lung (HCC)    Rapid atrial fibrillation ([place]) 02/17/2020   Acute CHF (congestive heart failure) ([place]) 02/17/2020   Emphysema lung ([place]) 02/17/2020   Goals of care, counseling/discussion 01/13/2020   Mass of lower lobe of left lung 12/22/2019   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) 01/25/2016   Current tobacco use 01/25/2016   Chronic use of opiate drug for therapeutic purpose 01/17/2016   Hyperkalemia 12/21/2015   Dizziness 07/25/2015   Immunizations incomplete 03/04/2015   MI (mitral incompetence) 12/20/2014   Simple  chronic bronchitis ([place]) 12/15/2014   Vasomotor rhinitis 12/15/2014   Coronary artery disease of native artery of native heart with stable angina pectoris ([place]) 12/12/2014   TI (tricuspid incompetence) 12/12/2014   Breathlessness on exertion 12/12/2014   Stage 3 chronic kidney disease 06/15/2014   Benign essential hypertension 06/15/2014   Primary cancer of right upper lobe of lung ([place]) 06/15/2014   Osteoporosis, post-menopausal 06/15/2014   Peripheral vascular disease ([place]) 06/15/2014   Renal artery stenosis ([place]) 06/15/2014   History of left breast cancer 11/18/2011   History of myocardial infarction 05/17/2001   PCP:  Ezequiel Kayser, MD Pharmacy:   [id number] [place] [place] - [address] [place] [address] Phone: [phone] Fax: [phone]  [facility] [place] [place] [facility] to Registered [place] [address] Phone: [phone] Fax: [phone]  [id number] [address] [facility] [address] [place] [address] Phone: [phone] Fax: [phone]     Social Determinants of Health (SDOH)  Interventions    Readmission Risk Interventions Readmission Risk Prevention Plan 02/20/2020  Transportation Screening Complete  PCP or Specialist Appt within 3-5 Days Complete  HRI or Home Care Consult Complete  Palliative Care Screening Not Applicable  Medication Review (RN Care Manager) Complete  Some recent data might be hidden

## 2020-06-06 NOTE — ED Triage Notes (Signed)
pt arrives [facility] from home for weakness. leg pain. denies SOB. a fib on monitor. CBG 102. 98.1. 144/100, 97%, HR 140-180. some confusion and agitation. fell yesterday AM. takes eliquis. bruise to arms, R foot, skin tears. Answering questions appropriately upon arrival.

## 2020-06-06 NOTE — ED Notes (Addendum)
Per daughter pt was treated for a UTI recently. Had been weak for a while but was getting stronger with therapy. The last few days she has declined in strength and hasn't been able to walk with walker like normal. Has been needing assistance to stand/walk. Had to be picked up today. Daughter states BP yesterday with therapy was in the 90's and standing dropped to the 80's.

## 2020-06-06 NOTE — H&P (Addendum)
History and Physical    [name] [name] VQQ:[id number] DOB: Aug 13, 1928 DOA: 06/06/2020  PCP: Ezequiel Kayser, MD   Patient coming from: Home  I have personally briefly reviewed patient's old medical records in [place]  Chief Complaint: Weakness  HPI: [name] [name] is a 86 y.o. female with medical history significant  for atrial fibrillation on chronic anticoagulation therapy with Eliquis, history of lung cancer who has completed 5 cycles of chemotherapy and 6 weeks of radiation therapy.   Patient was brought into the ER by EMS and in the field patient was noted to have a heart rate between 140 and 180, blood pressure 144/100.  Her daughter is also at the bedside and is concerned about her generalized weakness which she has had for about 2 to 3 days.  Patient has been treated for UTI twice in the last 1 month, 06/14 she had a urine culture yielded E. coli and on 6/22 urine culture yielded E. coli and Pseudomonas.  Patient completed a course of antibiotic therapy and was back to her baseline until the last 3 days when she developed generalized weakness which daughter states is the way she presents when she has a UTI.  Patient complains of urinary incontinence but denies having any dysuria or frequency. Patient had a fall about 2 days ago, daughter states she slipped while bending over to pick something.  She denies feeling dizzy or lightheaded she denies having any cough, no chest pain, no abdominal pain, no nausea, no changes in her bowel habits. Labs revealed leukocytosis with a left shift, potassium of 5 and lactate of 2.2 Chest x-ray reviewed by me shows hyperinflated lungs.  No evidence of an infiltrate. CT scan of abdomen and pelvis shows no signs of acute abnormality in the abdomen or pelvis post infectious changes in the RIGHT lower lobe along with a more focal nodular area at the RIGHT lung base above the RIGHT hemidiaphragm potentially post infectious scarring. Given  patient history would suggest close attention on follow-up. Twelve-lead EKG reviewed by me shows A. fib with a rapid ventricular rate and PVCs   ED Course: Patient is a 86 year old female who was brought in by EMS for evaluation of generalized weakness.  In the field she was noted to be tachycardic with heart rate between 140 and 180.  The rest of her vital signs are within normal limits.  Patient has leukocytosis, with a white count of 17,000 and lactic acid level of 2.  She also complains of generalized weakness which the family states is the way she presents when she has a UTI.  She will be referred to observation status.  Review of Systems: As per HPI otherwise 10 point review of systems negative.    Past Medical History:  Diagnosis Date   Breast cancer ([place]) 08/19/2012   LT LUMPECTOMY, radiation tx.    COPD (chronic obstructive pulmonary disease) ([place])    Hailey-hailey disease    Hyperlipemia    Hypertension    Lung cancer ([place]) 2014   RT LUNG, radiation tx   Migraine    Myocardial infarction [facility])    Osteoporosis    Personal history of radiation therapy 2013   left breast ca   Personal history of radiation therapy 2014   lung ca   Radiation 2013   FOR BREAST CA   Radiation 2014   FOR LUNG CA   Renal artery stenosis ([facility])    Renal insufficiency     Past Surgical History:  Procedure Laterality Date   ABDOMINAL HYSTERECTOMY     APPENDECTOMY     BREAST BIOPSY Left 08/03/2012   invasive mammary carcinoma, u/s guided bx   BREAST LUMPECTOMY Left 2013   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   cardiac stents     PARATHYROIDECTOMY     TONSILLECTOMY       reports that she has been smoking. She has a 70.00 pack-year smoking history. She has never used smokeless tobacco. She reports that she does not drink alcohol and does not use drugs.  Allergies  Allergen Reactions   Bactrim [Sulfamethoxazole-Trimethoprim] Other (See  Comments)    Reduced kidney function.  Per Dr. Holley Raring should not take   Ibuprofen     Reduced kidney function. Per Dr. Holley Raring should not take   [facility] Macro] Other (See Comments)    Reduced Kidney function.  Should no longer take per Dr. Holley Raring   Tape Rash    Family History  Problem Relation Age of Onset   Breast cancer Sister 66     Prior to Admission medications   Medication Sig Start Date End Date Taking? Authorizing Provider  clotrimazole (MYCELEX) 10 MG troche Take 10 mg by mouth 5 (five) times daily. 06/04/20  Yes [provider]  dexamethasone (DECADRON) 4 MG tablet Take 1 tablet (4 mg total) by mouth daily. 05/14/20  Yes Lloyd Huger, MD  diltiazem (CARDIZEM CD) 120 MG 24 hr capsule Take 1 capsule (120 mg total) by mouth daily. 02/21/20  Yes Fritzi Mandes, MD  ELIQUIS 2.5 MG TABS tablet Take 2.5 mg by mouth 2 (two) times daily. 01/24/20  Yes [provider]  feeding supplement (BOOST HIGH PROTEIN) LIQD Take 1 Container by mouth 3 (three) times daily between meals.   Yes [provider]  furosemide (LASIX) 20 MG tablet Take 0.5 tablets (10 mg total) by mouth daily. 02/21/20  Yes Fritzi Mandes, MD  lisinopril (ZESTRIL) 2.5 MG tablet Take 2.5 mg by mouth at bedtime.   Yes [provider]  metoprolol succinate (TOPROL-XL) 25 MG 24 hr tablet Take 25 mg by mouth 2 (two) times daily.  01/24/20  Yes [provider]  mirtazapine (REMERON) 7.5 MG tablet Take 7.5 mg by mouth at bedtime. 06/04/20  Yes [provider]  Multiple Vitamin (MULTIVITAMIN WITH MINERALS) TABS tablet Take 1 tablet by mouth daily.   Yes [provider]  Multiple Vitamins-Minerals (PRESERVISION AREDS PO) Take 1 tablet by mouth 2 (two) times daily.   Yes [provider]  ondansetron (ZOFRAN) 8 MG tablet Take 1 tablet (8 mg total) by mouth 2 (two) times daily as needed for refractory nausea / vomiting. 01/06/20  Yes Lloyd Huger, MD  simvastatin (ZOCOR) 20 MG tablet Take 20 mg by mouth at bedtime. 12/22/19  Yes [provider]  traMADol (ULTRAM) 50 MG tablet Take 1 tablet (50 mg total) by mouth every 12 (twelve) hours as needed. 11/28/19  Yes Sable Feil, PA-C  albuterol [facility] HFA) 108 (90 Base) MCG/ACT inhaler Inhale 2 puffs into the lungs every 6 (six) hours as needed for wheezing or shortness of breath.  08/15/15   [provider]  calcipotriene-betamethasone (TACLONEX) ointment Apply 1 application topically daily. To legs and back as needed    [provider]  Calcium Carb-Cholecalciferol (OYSTER SHELL CALCIUM) 500-400 MG-UNIT TABS Take by mouth. Patient not taking: Reported on 06/06/2020    [provider]    Physical  Exam: Vitals:   06/06/20 0727 06/06/20 0730 06/06/20 0748 06/06/20 0749  BP: (!) 140/95 (!) 138/93    Pulse:    (!) 117  Resp:  (!) 25  (!) 21  Temp:   98.2 F (36.8 C)   TempSrc:   Oral   SpO2:    100%  Weight:  51.4 kg    Height:  5\' 3"  (1.6 m)       Vitals:   06/06/20 0727 06/06/20 0730 06/06/20 0748 06/06/20 0749  BP: (!) 140/95 (!) 138/93    Pulse:    (!) 117  Resp:  (!) 25  (!) 21  Temp:   98.2 F (36.8 C)   TempSrc:   Oral   SpO2:    100%  Weight:  51.4 kg    Height:  5\' 3"  (1.6 m)      Constitutional: NAD, alert and oriented x 3 Eyes: PERRL, lids and conjunctivae normal ENMT: Mucous membranes are moist.  Neck: normal, supple, no masses, no thyromegaly Respiratory: clear to auscultation bilaterally, no wheezing, no crackles. Normal respiratory effort. No accessory muscle use.  Cardiovascular: Irregularly irregular, no murmurs / rubs / gallops. No extremity edema. 2+ pedal pulses. No carotid bruits.  Abdomen: no tenderness, no masses palpated. No hepatosplenomegaly. Bowel sounds positive.  Musculoskeletal: no clubbing / cyanosis. No joint deformity upper and lower extremities.  Skin: no rashes, lesions, ulcers.  Neurologic:  No gross focal neurologic deficit.  Generalized weakness Psychiatric: Normal mood and affect.   Labs on Admission: I have personally reviewed following labs and imaging studies  CBC: Recent Labs  Lab 06/06/20 0743  WBC 17.4*  NEUTROABS 14.1*  HGB 11.8*  HCT 37.7  MCV 107.4*  PLT 423   Basic Metabolic Panel: Recent Labs  Lab 06/06/20 0743  NA 143  K 5.0  CL 103  CO2 29  GLUCOSE 83  BUN 67*  CREATININE 1.12*  CALCIUM 9.1   GFR: Estimated Creatinine Clearance: 27.1 mL/min (A) (by C-G formula based on SCr of 1.12 mg/dL (H)). Liver Function Tests: Recent Labs  Lab 06/06/20 0743  AST 38  ALT 42  ALKPHOS 79  BILITOT 1.1  PROT 6.2*  ALBUMIN 3.4*   No results for input(s): LIPASE, AMYLASE in the last 168 hours. No results for input(s): AMMONIA in the last 168 hours. Coagulation Profile: No results for input(s): INR, PROTIME in the last 168 hours. Cardiac Enzymes: No results for input(s): CKTOTAL, CKMB, CKMBINDEX, TROPONINI in the last 168 hours. BNP (last 3 results) No results for input(s): PROBNP in the last 8760 hours. HbA1C: No results for input(s): HGBA1C in the last 72 hours. CBG: No results for input(s): GLUCAP in the last 168 hours. Lipid Profile: No results for input(s): CHOL, HDL, LDLCALC, TRIG, CHOLHDL, LDLDIRECT in the last 72 hours. Thyroid Function Tests: No results for input(s): TSH, T4TOTAL, FREET4, T3FREE, THYROIDAB in the last 72 hours. Anemia Panel: No results for input(s): VITAMINB12, FOLATE, FERRITIN, TIBC, IRON, RETICCTPCT in the last 72 hours. Urine analysis:    Component Value Date/Time   COLORURINE YELLOW (A) 06/06/2020 0821   APPEARANCEUR CLEAR (A) 06/06/2020 0821   APPEARANCEUR Clear 03/05/2015 2050   LABSPEC 1.009 06/06/2020 0821   LABSPEC 1.020 03/05/2015 2050   PHURINE 6.0 06/06/2020 0821   GLUCOSEU NEGATIVE 06/06/2020 0821   GLUCOSEU Negative 03/05/2015 2050   HGBUR NEGATIVE 06/06/2020 [place] NEGATIVE 06/06/2020  0821   BILIRUBINUR Negative 03/05/2015 2050   KETONESUR NEGATIVE 06/06/2020 5361  PROTEINUR NEGATIVE 06/06/2020 0821   NITRITE NEGATIVE 06/06/2020 0821   LEUKOCYTESUR NEGATIVE 06/06/2020 0821   LEUKOCYTESUR Trace 03/05/2015 2050    Radiological Exams on Admission: DG Chest 2 View  Result Date: 06/06/2020 CLINICAL DATA:  Lung cancer.  Atrial fibrillation.  Weakness. EXAM: CHEST - 2 VIEW COMPARISON:  04/06/2020 FINDINGS: Heart size and vascularity normal. Atherosclerotic calcification in the thoracic aorta. COPD with hyperinflation. Right upper lobe linear density unchanged. Patchy airspace disease in the right lung base shows interval improvement. Left lung clear. No new area of infiltrate or effusion. Right coronary artery stent. IMPRESSION: COPD. Improvement in right lower lobe airspace disease. No new area of pneumonia. Electronically Signed   By: Franchot Gallo M.D.   On: 06/06/2020 08:37   CT Head Wo Contrast  Result Date: 06/06/2020 CLINICAL DATA:  Head trauma, minor; fall on Eliquis, evaluate for ICH. EXAM: CT HEAD WITHOUT CONTRAST TECHNIQUE: Contiguous axial images were obtained from the base of the skull through the vertex without intravenous contrast. COMPARISON:  Head CT 03/24/2005 FINDINGS: Brain: Moderate generalized parenchymal atrophy, progressed as compared to the head CT of 03/23/2005. Moderate patchy hypoattenuation within the cerebral white matter, also progressed as compared to the prior CT. Small left parietal lobe chronic white matter infarct. There is no acute intracranial hemorrhage. No demarcated cortical infarct. No extra-axial fluid collection. No evidence of intracranial mass. No midline shift. Vascular: No hyperdense vessel.  Atherosclerotic calcifications. Skull: Normal. Negative for fracture or focal lesion. Sinuses/Orbits: Visualized orbits show no acute finding. Mild scattered paranasal sinus mucosal thickening at the imaged levels. Small left sphenoid sinus mucous  retention cyst. No significant mastoid effusion at the imaged levels. IMPRESSION: No evidence of acute intracranial abnormality. Moderate generalized parenchymal atrophy and chronic small vessel ischemic disease, progressed as compared to the prior head CT of 03/23/2005. Mild paranasal sinus mucosal thickening. Small left sphenoid sinus mucous retention cyst. Electronically Signed   By: Kellie Simmering DO   On: 06/06/2020 08:51   CT ABDOMEN PELVIS W CONTRAST  Result Date: 06/06/2020 CLINICAL DATA:  Abdominal pain, acute with RIGHT upper quadrant and LEFT lower quadrant pain. EXAM: CT ABDOMEN AND PELVIS WITH CONTRAST TECHNIQUE: Multidetector CT imaging of the abdomen and pelvis was performed using the standard protocol following bolus administration of intravenous contrast. CONTRAST:  44mL OMNIPAQUE IOHEXOL 300 MG/ML  SOLN COMPARISON:  November 01, 2019 FINDINGS: Lower chest: Signs of cylindrical bronchiectasis and inspissated material within RIGHT lower lobe bronchi. Overlying the RIGHT hemidiaphragm is an area of nodularity measuring 2.3 x 1.4 cm this has not changed significantly in the interval since December of 2020, perhaps even slightly smaller though position of the RIGHT hemidiaphragm could change appearance. Marked dilation of the distal thoracic aorta with tortuosity and eccentric plaque measuring approximately 4.2 cm greatest axial dimension similar to November of 2019. Due to tortuosity measurements could be exaggerated. Measurement in long axis is 5.1 cm. This is unchanged and likely exaggerated by the aortic tortuosity at this level in the lower chest. Signs of prior percutaneous coronary intervention. No pleural effusion. Pulmonary emphysema. Hepatobiliary: Mildly lobular hepatic contours. No focal, suspicious hepatic lesion. No biliary duct dilation. No pericholecystic stranding. Pancreas: 1.9 x 1.8 cm cystic lesion in the head of the pancreas (image 24, series 2) no main duct dilation or  peripancreatic inflammation. Spleen: Spleen normal in size and contour. Adrenals/Urinary Tract: LEFT adrenal with enlargement and calcification measuring approximately 2.0 x 1.8 cm in greatest axial dimension. Given position of adrenal  with inspiratory effort this could be altered but it appears that it may be smaller, when measured in a similar location previously it measured approximately 2.5 x 1.8 cm, measuring approximately 1.5 cm greatest thickness on today's study more superiorly near the calcification previously approximately 1.9 cm in a similar location. The RIGHT adrenal is normal. Urinary bladder is distended. No hydronephrosis. Renal cortical scarring. Small cysts in the kidneys. Stomach/Bowel: No acute gastrointestinal process. Colonic diverticulosis. Vascular/Lymphatic: Vascular disease with infrarenal abdominal aortic aneurysm measuring approximately 4.8 x 3.8 cm just below the renal arteries. The aortic tortuosity showing a similar appearance. Tortuosity exaggerating luminal size, appearance similar to the previous exam with large amount of eccentric mural thrombus/soft plaque along the RIGHT lateral wall at the area of greatest dilation. 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 No periaortic stranding. Patent visceral branches despite severe atheromatous changes. No adenopathy in the upper abdomen or in the retroperitoneum. Small retrocrural lymph nodes are stable. No pelvic adenopathy. Reproductive: Post hysterectomy with signs of pelvic floor dysfunction with bowing of pelvic floor musculature. Other: No ascites.  No free air. Musculoskeletal: No acute bone finding. No destructive bone process. IMPRESSION: No CT signs of acute abnormality in the abdomen or pelvis post infectious changes in the RIGHT lower lobe along with a more focal nodular area at the RIGHT  lung base above the RIGHT hemidiaphragm potentially post infectious scarring. Given patient history would suggest close attention on follow-up. Thoracic and abdominal aneurysmal dilation and severe vascular disease with tortuosity as described. Findings not changed since prior imaging. Stable cystic pancreatic lesion, attention on follow-up. Electronically Signed   By: Zetta Bills M.D.   On: 06/06/2020 09:47    EKG: Independently reviewed. Atrial fibrillation Premature ventricular complexes  Assessment/Plan Principal Problem:   Generalized weakness Active Problems:   Benign essential hypertension   Rapid atrial fibrillation (HCC)   Malignant neoplasm of lower lobe of left lung (HCC)   Depression    Generalized weakness Unclear etiology Patient is status post fall We will place patient on fall precautions We will request PT eval   Leukocytosis Most likely leukemoid reaction secondary to recent systemic steroid administration Unlikely to be secondary to infection Discussed with patient's daughter who is concerned her mother may have a UTI, explained to her in detail her mother has no pyuria and there is no reason to administer IV antibiotics at this time since leukocytosis is secondary to systemic steroid administration. We will only treat if her urine culture is positive   Lactic acidosis Unclear etiology No obvious source of infection at this time Continue hydration   A. fib with rapid ventricular rate Continue Eliquis as primary prophylaxis for an acute stroke Continue diltiazem and metoprolol for rate control   Depression Continue mirtazapine   Malignant neoplasm of lower lobe of left lung Follow-up with oncology as an outpatient     DVT prophylaxis: Eliquis Code Status: DNR Family Communication: Greater than 50% of time was spent discussing patient's condition and plan of care with her and her daughter at the bedside.  All questions and concerns have been  addressed.  They verbalized understanding and agree with the plan. Disposition Plan: Back to previous home environment Consults called: Physical therapy    Soniya Ashraf MD [facility]     06/06/2020, 1:16 PM

## 2020-06-06 NOTE — ED Notes (Signed)
Pt assisted out of ED stretcher and into hospital bed. Pt resting and comfortable at this time.

## 2020-06-06 NOTE — ED Notes (Signed)
Date and time results received: 06/06/20 0835   Test: lactic acid Critical Value: 2.0  Name of Provider Notified: Dr. Tamala Julian

## 2020-06-06 NOTE — ED Notes (Signed)
Patient provided pitcher and is encouraged to drink per orders. She is observed drinking water provided. She also ambulated with a walker at this time without difficulty. Reports feeling "fine" and denies pain and/or dizziness.

## 2020-06-06 NOTE — Progress Notes (Signed)
Daughter and pt refused low bed despite education and safety risk

## 2020-06-06 NOTE — Progress Notes (Addendum)
   06/06/20 2031  Assess: MEWS Score  Temp 98.2 F (36.8 C)  BP 91/62  Pulse Rate 75  Resp (!) 26  SpO2 97 %  O2 Device Room Air  Assess: MEWS Score  MEWS Temp 0  MEWS Systolic 1  MEWS Pulse 0  MEWS RR 2  MEWS LOC 0  MEWS Score 3  MEWS Score Color Yellow  Assess: if the MEWS score is Yellow or Red  Were vital signs taken at a resting state? Yes  Focused Assessment No change from prior assessment  Early Detection of Sepsis Score *See Row Information* Medium  MEWS guidelines implemented *See Row Information* No, vital signs rechecked  Treat  MEWS Interventions Other (Comment)  Pain Scale 0-10  Pain Score 0  Take Vital Signs  Increase Vital Sign Frequency  Yellow: Q 2hr X 2 then Q 4hr X 2, if remains yellow, continue Q 4hrs  Escalate  MEWS: Escalate Yellow: discuss with charge nurse/RN and consider discussing with provider and RRT  Notify: Charge Nurse/RN  Name of Charge Nurse/RN Notified Jackie, RN  Date Charge Nurse/RN Notified 06/06/20  Time Charge Nurse/RN Notified 2040  Document  Progress note created (see row info) Yes  Patient has no complaints - not symptomatic.  Daughter who is an EMT says increased respirations are her baseline. Normal BPM are 24-30. Has fluids running.  Monitoring status.

## 2020-06-07 DIAGNOSIS — L8990 Pressure ulcer of unspecified site, unspecified stage: Secondary | ICD-10-CM | POA: Insufficient documentation

## 2020-06-07 DIAGNOSIS — R531 Weakness: Secondary | ICD-10-CM | POA: Diagnosis not present

## 2020-06-07 LAB — CBC
HCT: 32.4 % — ABNORMAL LOW (ref 36.0–46.0)
Hemoglobin: 10.6 g/dL — ABNORMAL LOW (ref 12.0–15.0)
MCH: 33.5 pg (ref 26.0–34.0)
MCHC: 32.7 g/dL (ref 30.0–36.0)
MCV: 102.5 fL — ABNORMAL HIGH (ref 80.0–100.0)
Platelets: 131 10*3/uL — ABNORMAL LOW (ref 150–400)
RBC: 3.16 MIL/uL — ABNORMAL LOW (ref 3.87–5.11)
RDW: 18.1 % — ABNORMAL HIGH (ref 11.5–15.5)
WBC: 14.7 10*3/uL — ABNORMAL HIGH (ref 4.0–10.5)
nRBC: 0.1 % (ref 0.0–0.2)

## 2020-06-07 LAB — BASIC METABOLIC PANEL
Anion gap: 6 (ref 5–15)
BUN: 63 mg/dL — ABNORMAL HIGH (ref 8–23)
CO2: 32 mmol/L (ref 22–32)
Calcium: 8.4 mg/dL — ABNORMAL LOW (ref 8.9–10.3)
Chloride: 104 mmol/L (ref 98–111)
Creatinine, Ser: 1.08 mg/dL — ABNORMAL HIGH (ref 0.44–1.00)
GFR calc Af Amer: 52 mL/min — ABNORMAL LOW (ref >60)
GFR calc non Af Amer: 45 mL/min — ABNORMAL LOW (ref >60)
Glucose, Bld: 104 mg/dL — ABNORMAL HIGH (ref 70–99)
Potassium: 4.6 mmol/L (ref 3.5–5.1)
Sodium: 142 mmol/L (ref 135–145)

## 2020-06-07 LAB — URINE CULTURE: Culture: NO GROWTH

## 2020-06-07 LAB — TROPONIN I (HIGH SENSITIVITY)
Troponin I (High Sensitivity): 57 ng/L — ABNORMAL HIGH (ref <18)
Troponin I (High Sensitivity): 53 ng/L — ABNORMAL HIGH (ref <18)

## 2020-06-07 LAB — LACTIC ACID, PLASMA
Lactic Acid, Venous: 2.4 mmol/L (ref 0.5–1.9)
Lactic Acid, Venous: 2.1 mmol/L (ref 0.5–1.9)

## 2020-06-07 LAB — CK: Total CK: 29 U/L — ABNORMAL LOW (ref 38–234)

## 2020-06-07 LAB — MAGNESIUM: Magnesium: 2.5 mg/dL — ABNORMAL HIGH (ref 1.7–2.4)

## 2020-06-07 MED ORDER — ATORVASTATIN CALCIUM 20 MG PO TABS
10.0000 mg | ORAL_TABLET | Freq: Every day | ORAL | Status: DC
  Administered 2020-06-07: 10 mg via ORAL
  Filled 2020-06-07: qty 1

## 2020-06-07 MED ORDER — NICOTINE 14 MG/24HR TD PT24
14.0000 mg | MEDICATED_PATCH | Freq: Every day | TRANSDERMAL | Status: DC
  Administered 2020-06-07 – 2020-06-08 (×2): 14 mg via TRANSDERMAL
  Filled 2020-06-07 (×2): qty 1

## 2020-06-07 NOTE — Progress Notes (Addendum)
PROGRESS NOTE    [name] [name]  KAJ:[id number] DOB: September 08, 1928 DOA: 06/06/2020 PCP: Ezequiel Kayser, MD   Brief Narrative:  [name] [name] is a 86 y.o. female with medical history significant  foratrial fibrillation on chronic anticoagulation therapy with Eliquis,history of lung cancer who has completed 5 cycles of chemotherapy and 6 weeks of radiation therapy. Patient was brought into the ER by EMS and in the field patient was noted to have a heart rate between 140 and 180, blood pressure 144/100.  Her daughter is also at the bedside and is concerned about her generalized weakness which she has had for about 2 to 3 days.  Patient has been treated for UTI twice in the last 1 month, 06/14 she had a urine culture yielded E. coli and on 6/22 urine culture yielded E. coli and Pseudomonas.  Patient completed a course of antibiotic therapy and was back to her baseline until the last 3 days when she developed generalized weakness which daughter states is the way she presents when she has a UTI.  Patient complains of urinary incontinence but denies having any dysuria or frequency. Patient had a fall about 2 days ago, daughter states she slipped while bending over to pick something.  She denies feeling dizzy or lightheaded she denies having any cough, no chest pain, no abdominal pain, no nausea, no changes in her bowel habits.  UA completely benign.  Urine cultures pending.  Subjective: Denies any new complaint.  Stating that recently her hand and feet started hurting.  Denies any burning pain, denies any tingling or numbness.  Per patient she has no history of prior arthritis but she did received chemotherapy. Daughter was in the room who were more concerned about her worsening weakness and A. Fib.  She was very focused on UTI although told multiple times that her urine looks very benign and giving her antibiotics for no reason will be more harmful.  Assessment & Plan:   Principal  Problem:   Generalized weakness Active Problems:   Benign essential hypertension   Rapid atrial fibrillation (HCC)   Malignant neoplasm of lower lobe of left lung (HCC)   Depression   Pressure injury of skin  Generalized weakness.  Most likely multifactorial, advanced age with underlying malignancy and recent UTIs. Patient did had a mechanical fall as she was bending over to reach on something on floor and lost balance.  No concern for any syncopal episode. -PT evaluation pending. -Continue with fall precautions.  A. Fib.  Patient did had some air RVR on presentation.  Currently rate controlled on home meds. -Continue home dose of diltiazem, metoprolol and Eliquis. -Cardiology was consulted at daughter's request.  Leukocytosis.  No obvious infection.  Most likely a leukemoid reaction secondary to recent systemic steroid use.  Daughter was very worried about UTI and was not convinced despite explaining a bland UA. Leukocytosis improving.  2 recent treatments for presumed UTI with antibiotics. -No need for antibiotics at this time. -Follow-up urinary cultures.  Elevated lactic acid.  BMP without any acidosis.  Bicarb within normal limit.  Unclear etiology, no obvious source of infection.  Checked CK which was within normal limit.  Last check was 2.1. -Check troponin -Continue IV hydration. -Recheck with morning labs.  History of lung cancer. -Being followed up by oncology as an outpatient.  Depression. -Continue mirtazapine  Objective: Vitals:   06/07/20 0231 06/07/20 0430 06/07/20 0907 06/07/20 1245  BP: 118/72 123/87 109/79 109/80  Pulse: 88 92 78 94  Resp: (!) 28 (!) 28 12 18   Temp: 98.2 F (36.8 C) 98.3 F (36.8 C) 97.8 F (36.6 C) 98.3 F (36.8 C)  TempSrc:  Oral Oral Oral  SpO2: 96% 96%  100%  Weight:      Height:        Intake/Output Summary (Last 24 hours) at 06/07/2020 1356 Last data filed at 06/07/2020 1005 Gross per 24 hour  Intake 975.56 ml  Output --   Net 975.56 ml   Filed Weights   06/06/20 0730  Weight: 51.4 kg    Examination:  General exam: Frail elderly lady, appears calm and comfortable  Respiratory system: Clear to auscultation. Respiratory effort normal. Cardiovascular system: S1 & S2 heard, RRR. No JVD, murmurs, rubs, gallops or clicks. Gastrointestinal system: Soft, nontender, nondistended, bowel sounds positive. Central nervous system: Alert and oriented. No focal neurological deficits.Symmetric 5 x 5 power. Extremities: No edema, no cyanosis, pulses intact and symmetrical. Psychiatry: Judgement and insight appear normal. Mood & affect appropriate.    DVT prophylaxis: Eliquis Code Status: DNR Family Communication: Daughter was updated at bedside Disposition Plan:  Status is: Inpatient  Remains inpatient appropriate because:IV treatments appropriate due to intensity of illness or inability to take PO   Dispo: The patient is from: Home              Anticipated d/c is to: Home              Anticipated d/c date is: 1 day              Patient currently is not medically stable to d/c.  Consultants:   Cardiology  Procedures:  Antimicrobials:   Data Reviewed: I have personally reviewed following labs and imaging studies  CBC: Recent Labs  Lab 06/06/20 0743 06/07/20 0613  WBC 17.4* 14.7*  NEUTROABS 14.1*  --   HGB 11.8* 10.6*  HCT 37.7 32.4*  MCV 107.4* 102.5*  PLT 169 263*   Basic Metabolic Panel: Recent Labs  Lab 06/06/20 0743 06/07/20 0613  NA 143 142  K 5.0 4.6  CL 103 104  CO2 29 32  GLUCOSE 83 104*  BUN 67* 63*  CREATININE 1.12* 1.08*  CALCIUM 9.1 8.4*   GFR: Estimated Creatinine Clearance: 28.1 mL/min (A) (by C-G formula based on SCr of 1.08 mg/dL (H)). Liver Function Tests: Recent Labs  Lab 06/06/20 0743  AST 38  ALT 42  ALKPHOS 79  BILITOT 1.1  PROT 6.2*  ALBUMIN 3.4*   No results for input(s): LIPASE, AMYLASE in the last 168 hours. No results for input(s): AMMONIA in the  last 168 hours. Coagulation Profile: No results for input(s): INR, PROTIME in the last 168 hours. Cardiac Enzymes: Recent Labs  Lab 06/07/20 1213  CKTOTAL 29*   BNP (last 3 results) No results for input(s): PROBNP in the last 8760 hours. HbA1C: No results for input(s): HGBA1C in the last 72 hours. CBG: No results for input(s): GLUCAP in the last 168 hours. Lipid Profile: No results for input(s): CHOL, HDL, LDLCALC, TRIG, CHOLHDL, LDLDIRECT in the last 72 hours. Thyroid Function Tests: No results for input(s): TSH, T4TOTAL, FREET4, T3FREE, THYROIDAB in the last 72 hours. Anemia Panel: No results for input(s): VITAMINB12, FOLATE, FERRITIN, TIBC, IRON, RETICCTPCT in the last 72 hours. Sepsis Labs: Recent Labs  Lab 06/06/20 0743 06/06/20 1052 06/07/20 0817 06/07/20 1136  LATICACIDVEN 2.0* 2.2* 2.4* 2.1*    Recent Results (from the past 240 hour(s))  Urine culture  Status: None   Collection Time: 06/06/20  8:21 AM   Specimen: Urine, Random  Result Value Ref Range Status   Specimen Description   Final    URINE, RANDOM Performed at [facility], [address], [place], [place] [address]    Special Requests   Final    NONE Performed at [facility], [address] [place], [place] [address]    Culture   Final    NO GROWTH Performed at [facility], [address], [place], [place] [address]    Report Status 06/07/2020 FINAL  Final     Radiology Studies: DG Chest 2 View  Result Date: 06/06/2020 CLINICAL DATA:  Lung cancer.  Atrial fibrillation.  Weakness. EXAM: CHEST - 2 VIEW COMPARISON:  04/06/2020 FINDINGS: Heart size and vascularity normal. Atherosclerotic calcification in the thoracic aorta. COPD with hyperinflation. Right upper lobe linear density unchanged. Patchy airspace disease in the right lung base shows interval improvement. Left lung clear. No new area of infiltrate or effusion. Right coronary artery stent. IMPRESSION: COPD.  Improvement in right lower lobe airspace disease. No new area of pneumonia. Electronically Signed   By: Franchot Gallo M.D.   On: 06/06/2020 08:37   CT Head Wo Contrast  Result Date: 06/06/2020 CLINICAL DATA:  Head trauma, minor; fall on Eliquis, evaluate for ICH. EXAM: CT HEAD WITHOUT CONTRAST TECHNIQUE: Contiguous axial images were obtained from the base of the skull through the vertex without intravenous contrast. COMPARISON:  Head CT 03/24/2005 FINDINGS: Brain: Moderate generalized parenchymal atrophy, progressed as compared to the head CT of 03/23/2005. Moderate patchy hypoattenuation within the cerebral white matter, also progressed as compared to the prior CT. Small left parietal lobe chronic white matter infarct. There is no acute intracranial hemorrhage. No demarcated cortical infarct. No extra-axial fluid collection. No evidence of intracranial mass. No midline shift. Vascular: No hyperdense vessel.  Atherosclerotic calcifications. Skull: Normal. Negative for fracture or focal lesion. Sinuses/Orbits: Visualized orbits show no acute finding. Mild scattered paranasal sinus mucosal thickening at the imaged levels. Small left sphenoid sinus mucous retention cyst. No significant mastoid effusion at the imaged levels. IMPRESSION: No evidence of acute intracranial abnormality. Moderate generalized parenchymal atrophy and chronic small vessel ischemic disease, progressed as compared to the prior head CT of 03/23/2005. Mild paranasal sinus mucosal thickening. Small left sphenoid sinus mucous retention cyst. Electronically Signed   By: Kellie Simmering DO   On: 06/06/2020 08:51   CT ABDOMEN PELVIS W CONTRAST  Result Date: 06/06/2020 CLINICAL DATA:  Abdominal pain, acute with RIGHT upper quadrant and LEFT lower quadrant pain. EXAM: CT ABDOMEN AND PELVIS WITH CONTRAST TECHNIQUE: Multidetector CT imaging of the abdomen and pelvis was performed using the standard protocol following bolus administration of  intravenous contrast. CONTRAST:  75mL OMNIPAQUE IOHEXOL 300 MG/ML  SOLN COMPARISON:  November 01, 2019 FINDINGS: Lower chest: Signs of cylindrical bronchiectasis and inspissated material within RIGHT lower lobe bronchi. Overlying the RIGHT hemidiaphragm is an area of nodularity measuring 2.3 x 1.4 cm this has not changed significantly in the interval since December of 2020, perhaps even slightly smaller though position of the RIGHT hemidiaphragm could change appearance. Marked dilation of the distal thoracic aorta with tortuosity and eccentric plaque measuring approximately 4.2 cm greatest axial dimension similar to November of 2019. Due to tortuosity measurements could be exaggerated. Measurement in long axis is 5.1 cm. This is unchanged and likely exaggerated by the aortic tortuosity at this level in the lower chest. Signs  of prior percutaneous coronary intervention. No pleural effusion. Pulmonary emphysema. Hepatobiliary: Mildly lobular hepatic contours. No focal, suspicious hepatic lesion. No biliary duct dilation. No pericholecystic stranding. Pancreas: 1.9 x 1.8 cm cystic lesion in the head of the pancreas (image 24, series 2) no main duct dilation or peripancreatic inflammation. Spleen: Spleen normal in size and contour. Adrenals/Urinary Tract: LEFT adrenal with enlargement and calcification measuring approximately 2.0 x 1.8 cm in greatest axial dimension. Given position of adrenal with inspiratory effort this could be altered but it appears that it may be smaller, when measured in a similar location previously it measured approximately 2.5 x 1.8 cm, measuring approximately 1.5 cm greatest thickness on today's study more superiorly near the calcification previously approximately 1.9 cm in a similar location. The RIGHT adrenal is normal. Urinary bladder is distended. No hydronephrosis. Renal cortical scarring. Small cysts in the kidneys. Stomach/Bowel: No acute gastrointestinal process. Colonic diverticulosis.  Vascular/Lymphatic: Vascular disease with infrarenal abdominal aortic aneurysm measuring approximately 4.8 x 3.8 cm just below the renal arteries. The aortic tortuosity showing a similar appearance. Tortuosity exaggerating luminal size, appearance similar to the previous exam with large amount of eccentric mural thrombus/soft plaque along the RIGHT lateral wall at the area of greatest dilation. 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 No periaortic stranding. Patent visceral branches despite severe atheromatous changes. No adenopathy in the upper abdomen or in the retroperitoneum. Small retrocrural lymph nodes are stable. No pelvic adenopathy. Reproductive: Post hysterectomy with signs of pelvic floor dysfunction with bowing of pelvic floor musculature. Other: No ascites.  No free air. Musculoskeletal: No acute bone finding. No destructive bone process. IMPRESSION: No CT signs of acute abnormality in the abdomen or pelvis post infectious changes in the RIGHT lower lobe along with a more focal nodular area at the RIGHT lung base above the RIGHT hemidiaphragm potentially post infectious scarring. Given patient history would suggest close attention on follow-up. Thoracic and abdominal aneurysmal dilation and severe vascular disease with tortuosity as described. Findings not changed since prior imaging. Stable cystic pancreatic lesion, attention on follow-up. Electronically Signed   By: Zetta Bills M.D.   On: 06/06/2020 09:47    Scheduled Meds: . apixaban  2.5 mg Oral BID  . atorvastatin  10 mg Oral QHS  . clotrimazole  10 mg Oral 5 X Daily  . dexamethasone  4 mg Oral Daily  . diltiazem  120 mg Oral Daily  . feeding supplement  1 Container Oral TID BM  . furosemide  10 mg Oral Daily  . lisinopril  2.5 mg Oral Daily  . metoprolol succinate  25 mg Oral BID  .  mirtazapine  7.5 mg Oral QHS  . multivitamin with minerals  1 tablet Oral Daily  . multivitamin-lutein  1 capsule Oral BID  . nicotine  14 mg Transdermal Daily   Continuous Infusions: . sodium chloride 125 mL/hr at 06/07/20 1145     LOS: 0 days   Time spent: 40 minutes.  Lorella Nimrod, MD [facility]  If 7PM-7AM, please contact night-coverage [email or web address]  06/07/2020, 1:56 PM   This record has been created using Systems analyst. Errors have been sought and corrected,but may not always be located. Such creation errors do not reflect on the standard of care.

## 2020-06-07 NOTE — Progress Notes (Signed)
Financial planner note:  Patient is currently followed by [facility] community [facility]iative program at home. TOC Doran Clay made aware. Flo Shanks BSN, RN, [place] [phone]

## 2020-06-07 NOTE — Consult Note (Signed)
Cardiology Consultation Note    Patient ID: Tessy Pawelski [facility], MRN: [id number], DOB/AGE: Oct 06, 1928 86 y.o. Admit date: 06/06/2020   Date of Consult: 06/07/2020 Primary Physician: Ezequiel Kayser, MD Primary Cardiologist: Nehemiah Massed  Chief Complaint: weakness Reason for Consultation: weakness Requesting MD: Dr. Reesa Chew  HPI: Evana Runnels Johal is a 86 y.o. female with history of htn, cad s/p pci of rca in 2002 and 2003, history of tricuspid and mitral insufficiency, paroxysmal afib  Treated with eliquis for anticoagulation, renal artery stenosis, ckd iii, history of lung ca admitted after presenting to the er with complaints of weakness for several days and afib with rvr. Has ruled out for an mi with mild hs troponin elevation secondary to demand. Magnesium was 2.5, creatinine 1.08 and k of 4.6. CXR showed no new pneumonia. Head ct showed no acute abnormality. Currently treated with eliquis 2.5 bid, atorvastatin 10 mg daily, diltiazem 120 daiily, lasix 10 daily, lisinopril 2.5 daily, metoprolo l25 daily. EKG on admission showed afib with rvr at 125. Recently admitted with uti with no current evidence of uti. Rate improved on home meds. She currently states she feels well with no further weakness. Review of telemetry reveals afib with controlled vr with intermitant abarant beats.   Past Medical History:  Diagnosis Date  . Breast cancer ([place]) 08/19/2012   LT LUMPECTOMY, radiation tx.   Marland Kitchen COPD (chronic obstructive pulmonary disease) ([place])   . Hailey-hailey disease   . Hyperlipemia   . Hypertension   . Lung cancer ([place]) 2014   RT LUNG, radiation tx  . Migraine   . Myocardial infarction ([place])   . Osteoporosis   . Personal history of radiation therapy 2013   left breast ca  . Personal history of radiation therapy 2014   lung ca  . Radiation 2013   FOR BREAST CA  . Radiation 2014   FOR LUNG CA  . Renal artery stenosis ([place])   . Renal insufficiency       Surgical History:  Past  Surgical History:  Procedure Laterality Date  . ABDOMINAL HYSTERECTOMY    . APPENDECTOMY    . BREAST BIOPSY Left 08/03/2012   invasive mammary carcinoma, u/s guided bx  . 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 cardiac stents    . PARATHYROIDECTOMY    . TONSILLECTOMY       Home Meds: Prior to Admission medications   Medication Sig Start Date End Date Taking? Authorizing Provider  clotrimazole (MYCELEX) 10 MG troche Take 10 mg by mouth 5 (five) times daily. 06/04/20  Yes [provider]  dexamethasone (DECADRON) 4 MG tablet Take 1 tablet (4 mg total) by mouth daily. 05/14/20  Yes Lloyd Huger, MD  diltiazem (CARDIZEM CD) 120 MG 24 hr capsule Take 1 capsule (120 mg total) by mouth daily. 02/21/20  Yes Fritzi Mandes, MD  ELIQUIS 2.5 MG TABS tablet Take 2.5 mg by mouth 2 (two) times daily. 01/24/20  Yes [provider]  feeding supplement (BOOST HIGH PROTEIN) LIQD Take 1 Container by mouth 3 (three) times daily between meals.   Yes [provider]  furosemide (LASIX) 20 MG tablet Take 0.5 tablets (10 mg total) by mouth daily. 02/21/20  Yes Fritzi Mandes, MD  lisinopril (ZESTRIL) 2.5 MG tablet Take 2.5 mg by mouth at bedtime.   Yes [provider]  metoprolol succinate (TOPROL-XL) 25 MG 24 hr tablet Take 25 mg by mouth 2 (two) times  daily.  01/24/20  Yes [provider]  mirtazapine (REMERON) 7.5 MG tablet Take 7.5 mg by mouth at bedtime. 06/04/20  Yes [provider]  Multiple Vitamin (MULTIVITAMIN WITH MINERALS) TABS tablet Take 1 tablet by mouth daily.   Yes [provider]  Multiple Vitamins-Minerals (PRESERVISION AREDS PO) Take 1 tablet by mouth 2 (two) times daily.   Yes [provider]  ondansetron (ZOFRAN) 8 MG tablet Take 1 tablet (8 mg total) by mouth 2 (two) times daily as needed for refractory nausea / vomiting. 01/06/20  Yes Lloyd Huger, MD  simvastatin  (ZOCOR) 20 MG tablet Take 20 mg by mouth at bedtime. 12/22/19  Yes [provider]  traMADol (ULTRAM) 50 MG tablet Take 1 tablet (50 mg total) by mouth every 12 (twelve) hours as needed. 11/28/19  Yes Sable Feil, PA-C  albuterol [facility] HFA) 108 (90 Base) MCG/ACT inhaler Inhale 2 puffs into the lungs every 6 (six) hours as needed for wheezing or shortness of breath.  08/15/15   [provider]  calcipotriene-betamethasone (TACLONEX) ointment Apply 1 application topically daily. To legs and back as needed    [provider]  Calcium Carb-Cholecalciferol (OYSTER SHELL CALCIUM) 500-400 MG-UNIT TABS Take by mouth. Patient not taking: Reported on 06/06/2020    [provider]    Inpatient Medications:  . apixaban  2.5 mg Oral BID  . atorvastatin  10 mg Oral QHS  . clotrimazole  10 mg Oral 5 X Daily  . dexamethasone  4 mg Oral Daily  . diltiazem  120 mg Oral Daily  . feeding supplement  1 Container Oral TID BM  . furosemide  10 mg Oral Daily  . lisinopril  2.5 mg Oral Daily  . metoprolol succinate  25 mg Oral BID  . mirtazapine  7.5 mg Oral QHS  . multivitamin with minerals  1 tablet Oral Daily  . multivitamin-lutein  1 capsule Oral BID  . nicotine  14 mg Transdermal Daily   . sodium chloride 125 mL/hr at 06/07/20 1145    Allergies:  Allergies  Allergen Reactions  . Bactrim [Sulfamethoxazole-Trimethoprim] Other (See Comments)    Reduced kidney function.  Per Dr. Holley Raring should not take  . Ibuprofen     Reduced kidney function. Per Dr. Holley Raring should not take  . Macrobid [employer] Macro] Other (See Comments)    Reduced Kidney function.  Should no longer take per Dr. Holley Raring  . Tape Rash    Social History   Socioeconomic History  . Marital status: Widowed    Spouse name: Not on file  . Number of children: Not on file  . Years of education: Not on file  . Highest education level: Not on file  Occupational History  . Not on file   Tobacco Use  . Smoking status: Current Every Day Smoker    Packs/day: 1.00    Years: 70.00    Pack years: 70.00  . Smokeless tobacco: Never Used  Vaping Use  . Vaping Use: Never used  Substance and Sexual Activity  . Alcohol use: No  . Drug use: No  . Sexual activity: Never  Other Topics Concern  . Not on file  Social History Narrative  . Not on file   Social Determinants of Health   Financial Resource Strain:   . Difficulty of Paying Living Expenses:   Food Insecurity:   . Worried About Charity fundraiser in the Last Year:   . [employer]  of Food in the Last Year:   Transportation Needs:   . Film/video editor (Medical):   Marland Kitchen Lack of Transportation (Non-Medical):   Physical Activity:   . Days of Exercise per Week:   . Minutes of Exercise per Session:   Stress:   . Feeling of Stress :   Social Connections:   . Frequency of Communication with Friends and Family:   . Frequency of Social Gatherings with Friends and Family:   . Attends Religious Services:   . Active Member of Clubs or Organizations:   . Attends Archivist Meetings:   Marland Kitchen Marital Status:   Intimate Partner Violence:   . Fear of Current or Ex-Partner:   . Emotionally Abused:   Marland Kitchen Physically Abused:   . Sexually Abused:      Family History  Problem Relation Age of Onset  . Breast cancer Sister 62     Review of Systems: A 12-system review of systems was performed and is negative except as noted in the HPI.  Labs: Recent Labs    06/07/20 1213  CKTOTAL 29*   Lab Results  Component Value Date   WBC 14.7 (H) 06/07/2020   HGB 10.6 (L) 06/07/2020   HCT 32.4 (L) 06/07/2020   MCV 102.5 (H) 06/07/2020   PLT 131 (L) 06/07/2020    Recent Labs  Lab 06/06/20 0743 06/06/20 0743 06/07/20 0613  NA 143   < > 142  K 5.0   < > 4.6  CL 103   < > 104  CO2 29   < > 32  BUN 67*   < > 63*  CREATININE 1.12*   < > 1.08*  CALCIUM 9.1   < > 8.4*  PROT 6.2*  --   --   BILITOT 1.1  --   --    ALKPHOS 79  --   --   ALT 42  --   --   AST 38  --   --   GLUCOSE 83   < > 104*   < > = values in this interval not displayed.   No results found for: CHOL, HDL, LDLCALC, TRIG No results found for: DDIMER  Radiology/Studies:  DG Chest 2 View  Result Date: 06/06/2020 CLINICAL DATA:  Lung cancer.  Atrial fibrillation.  Weakness. EXAM: CHEST - 2 VIEW COMPARISON:  04/06/2020 FINDINGS: Heart size and vascularity normal. Atherosclerotic calcification in the thoracic aorta. COPD with hyperinflation. Right upper lobe linear density unchanged. Patchy airspace disease in the right lung base shows interval improvement. Left lung clear. No new area of infiltrate or effusion. Right coronary artery stent. IMPRESSION: COPD. Improvement in right lower lobe airspace disease. No new area of pneumonia. Electronically Signed   By: Franchot Gallo M.D.   On: 06/06/2020 08:37   CT Head Wo Contrast  Result Date: 06/06/2020 CLINICAL DATA:  Head trauma, minor; fall on Eliquis, evaluate for ICH. EXAM: CT HEAD WITHOUT CONTRAST TECHNIQUE: Contiguous axial images were obtained from the base of the skull through the vertex without intravenous contrast. COMPARISON:  Head CT 03/24/2005 FINDINGS: Brain: Moderate generalized parenchymal atrophy, progressed as compared to the head CT of 03/23/2005. Moderate patchy hypoattenuation within the cerebral white matter, also progressed as compared to the prior CT. Small left parietal lobe chronic white matter infarct. There is no acute intracranial hemorrhage. No demarcated cortical infarct. No extra-axial fluid collection. No evidence of intracranial mass. No midline shift. Vascular: No hyperdense vessel.  Atherosclerotic calcifications. Skull: Normal. Negative  for fracture or focal lesion. Sinuses/Orbits: Visualized orbits show no acute finding. Mild scattered paranasal sinus mucosal thickening at the imaged levels. Small left sphenoid sinus mucous retention cyst. No significant mastoid  effusion at the imaged levels. IMPRESSION: No evidence of acute intracranial abnormality. Moderate generalized parenchymal atrophy and chronic small vessel ischemic disease, progressed as compared to the prior head CT of 03/23/2005. Mild paranasal sinus mucosal thickening. Small left sphenoid sinus mucous retention cyst. Electronically Signed   By: Kellie Simmering DO   On: 06/06/2020 08:51   CT ABDOMEN PELVIS W CONTRAST  Result Date: 06/06/2020 CLINICAL DATA:  Abdominal pain, acute with RIGHT upper quadrant and LEFT lower quadrant pain. EXAM: CT ABDOMEN AND PELVIS WITH CONTRAST TECHNIQUE: Multidetector CT imaging of the abdomen and pelvis was performed using the standard protocol following bolus administration of intravenous contrast. CONTRAST:  10mL OMNIPAQUE IOHEXOL 300 MG/ML  SOLN COMPARISON:  November 01, 2019 FINDINGS: Lower chest: Signs of cylindrical bronchiectasis and inspissated material within RIGHT lower lobe bronchi. Overlying the RIGHT hemidiaphragm is an area of nodularity measuring 2.3 x 1.4 cm this has not changed significantly in the interval since December of 2020, perhaps even slightly smaller though position of the RIGHT hemidiaphragm could change appearance. Marked dilation of the distal thoracic aorta with tortuosity and eccentric plaque measuring approximately 4.2 cm greatest axial dimension similar to November of 2019. Due to tortuosity measurements could be exaggerated. Measurement in long axis is 5.1 cm. This is unchanged and likely exaggerated by the aortic tortuosity at this level in the lower chest. Signs of prior percutaneous coronary intervention. No pleural effusion. Pulmonary emphysema. Hepatobiliary: Mildly lobular hepatic contours. No focal, suspicious hepatic lesion. No biliary duct dilation. No pericholecystic stranding. Pancreas: 1.9 x 1.8 cm cystic lesion in the head of the pancreas (image 24, series 2) no main duct dilation or peripancreatic inflammation. Spleen: Spleen  normal in size and contour. Adrenals/Urinary Tract: LEFT adrenal with enlargement and calcification measuring approximately 2.0 x 1.8 cm in greatest axial dimension. Given position of adrenal with inspiratory effort this could be altered but it appears that it may be smaller, when measured in a similar location previously it measured approximately 2.5 x 1.8 cm, measuring approximately 1.5 cm greatest thickness on today's study more superiorly near the calcification previously approximately 1.9 cm in a similar location. The RIGHT adrenal is normal. Urinary bladder is distended. No hydronephrosis. Renal cortical scarring. Small cysts in the kidneys. Stomach/Bowel: No acute gastrointestinal process. Colonic diverticulosis. Vascular/Lymphatic: Vascular disease with infrarenal abdominal aortic aneurysm measuring approximately 4.8 x 3.8 cm just below the renal arteries. The aortic tortuosity showing a similar appearance. Tortuosity exaggerating luminal size, appearance similar to the previous exam with large amount of eccentric mural thrombus/soft plaque along the RIGHT lateral wall at the area of greatest dilation. 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 No periaortic stranding. Patent visceral branches despite severe atheromatous changes. No adenopathy in the upper abdomen or in the retroperitoneum. Small retrocrural lymph nodes are stable. No pelvic adenopathy. Reproductive: Post hysterectomy with signs of pelvic floor dysfunction with bowing of pelvic floor musculature. Other: No ascites.  No free air. Musculoskeletal: No acute bone finding. No destructive bone process. IMPRESSION: No CT signs of acute abnormality in the abdomen or pelvis post infectious changes in the RIGHT lower lobe along with a more focal nodular area at  the RIGHT lung base above the RIGHT hemidiaphragm  potentially post infectious scarring. Given patient history would suggest close attention on follow-up. Thoracic and abdominal aneurysmal dilation and severe vascular disease with tortuosity as described. Findings not changed since prior imaging. Stable cystic pancreatic lesion, attention on follow-up. Electronically Signed   By: Zetta Bills M.D.   On: 06/06/2020 09:47   NM PET Image Restag (PS) Skull Base To Thigh  Result Date: 05/10/2020 CLINICAL DATA:  Subsequent treatment strategy for lung cancer including left lower lobe mass and prior right upper lobe primary. Interval radiation therapy of right lower lobe and left lower lobe lesions. EXAM: NUCLEAR MEDICINE PET SKULL BASE TO THIGH TECHNIQUE: 6.1 mCi F-18 FDG was injected intravenously. Full-ring PET imaging was performed from the skull base to thigh after the radiotracer. CT data was obtained and used for attenuation correction and anatomic localization. Fasting blood glucose: 87 mg/dl COMPARISON:  PET-CT of 11/09/2019 and CT chest from 02/17/2020 FINDINGS: Mediastinal blood pool activity: SUV max 2.3 Liver activity: SUV max NA NECK: No significant abnormal hypermetabolic activity in this region. Incidental CT findings: Bilateral common carotid atherosclerotic calcification. CHEST: Interval marked reduction in the left lower lobe para-aortic mass compared to prior PET-CT and compared to CT chest of 02/17/2020, with the soft tissue thickness in this vicinity measuring 1.1 cm on image 107/3 (formerly about 2.5 cm). Maximum SUV in this region of treated tumor is currently 3.6, previously 15.5. Some of the current activity could be related to local radiation pneumonitis. A band of density in the right upper lobe has similar morphology to prior exams with maximum SUV of 2.1 (formerly 1.9). Airway thickening and mild consolidation at the right lung base worsened from 11/09/2019 but mostly similar to 02/17/2020, with associated airway plugging and a maximum  SUV along the region of volume loss and consolidation of 4.9. Active inflammation favored over tumor as a cause, although surveillance is likely warranted. Notable diffuse esophageal activity observed with maximum SUV 4.0, probably incidental/physiologic. No prominent or hypermetabolic subcarinal adenopathy at this time. Incidental CT findings: Small residual right pleural effusion. The left pleural effusion has resolved. Coronary, aortic arch, and branch vessel atherosclerotic vascular disease. Stable descending thoracic aortic aneurysm. Centrilobular emphysema. ABDOMEN/PELVIS: Low-density left adrenal lesion appears stable and favors adenoma based on low density characteristics, maximum SUV 4.2, formerly 2.9. Physiologic activity bowel, most striking in the cecum where there is focal metabolic activity up to 74.0 but no CT correlate or significant abnormality in this vicinity on the prior exam, hence likely incidental. Incidental CT findings: Stable abdominal aortic aneurysm. Upper portion measures up to 4.8 cm transverse. Abdominal aortic atherosclerosis. Sigmoid colon diverticulosis. Suspected pelvic floor laxity. SKELETON: No significant abnormal hypermetabolic activity in this region. Incidental CT findings: Multiple healing right-sided rib fractures including the right fourth seventh, eighth, ninth, and tenth ribs, first shown on the rib radiographs from 11/28/2019. IMPRESSION: 1. Marked improvement in the left lower lobe mass adjacent to the descending thoracic aorta. Tissue in the vicinity of the mass currently measures only about 1.1 cm in thickness, with maximum SUV of 3.6, previously 15.5. 2. Mild progression in airway thickening, airway plugging, and consolidation in the right lower lobe, favoring an infectious etiology. Maximum SUV 4.9. Surveillance is likely warranted. 3. Reduced right pleural effusion and resolved left pleural effusion. 4. Other imaging findings of potential clinical significance:  Aortic Atherosclerosis (ICD10-I70.0). Coronary and carotid atherosclerotic calcification. Left adrenal adenoma. Descending thoracic and abdominal aortic aneurysms. Sigmoid colon diverticulosis.  Pelvic floor laxity. Late phase healing of multiple right-sided rib fractures appear Electronically Signed   By: Van Clines M.D.   On: 05/10/2020 13:16    Wt Readings from Last 3 Encounters:  06/06/20 51.4 kg  05/14/20 48.2 kg  04/12/20 51.2 kg    EKG: afib with rvr  Physical Exam:  Blood pressure 109/80, pulse 94, temperature 98.3 F (36.8 C), temperature source Oral, resp. rate 18, height 5\' 3"  (1.6 m), weight 51.4 kg, SpO2 100 %. Body mass index is 20.07 kg/m. General: Well developed, well nourished, in no acute distress. Head: Normocephalic, atraumatic, sclera non-icteric, no xanthomas, nares are without discharge.  Neck: Negative for carotid bruits. JVD not elevated. Lungs: Clear bilaterally to auscultation without wheezes, rales, or rhonchi. Breathing is unlabored. Heart: irregularly irregular Abdomen: Soft, non-tender, non-distended with normoactive bowel sounds. No hepatomegaly. No rebound/guarding. No obvious abdominal masses. Msk:  Strength and tone appear normal for age. Extremities: No clubbing or cyanosis. No edema.  Distal pedal pulses are 2+ and equal bilaterally. Neuro: Alert and oriented X 3. No facial asymmetry. No focal deficit. Moves all extremities spontaneously. Psych:  Responds to questions appropriately with a normal affect.     Assessment and Plan  Earleen Reaper not appear to be due to ischemia.Marland Kitchen   Has improved iwht hydration.  afib-rate is now controlled on current out patient meds. Continue on current regimen and continue iwht eliquis 2.5 mg bid  uti-appears to have resolved.   Signed, Teodoro Spray MD 06/07/2020, 3:34 PM Pager: [phone]

## 2020-06-07 NOTE — Evaluation (Signed)
Physical Therapy Evaluation Patient Details Name: [name] [name] [facility] MRN: [id number] DOB: [birth year] Today's Date: 06/07/2020   History of Present Illness  presented to ER secondary to weakness, s/p fall in home environment; admitted for syncopal work up, management of essential hypertension and afib  Clinical Impression  Upon evaluation, patient alert and oriented; follows commands and eager for OOB activities as tolerated.  Bilat UE/LE strength and ROM grossly symmetrical and WFL for basic transfers and gait, though generally deconditioned throughout. No focal weakness appreciated. No pain reported.  Able to complete bed mobility with mod indep; sit/stand, basic transfers and gait (25') with RW.  Demonstrates forward flexed posture with mod WBing bilat UEs; choppy stepping pattern with limited heel strike/toe off, decreased cadence and decreased balance reactions.  Do recommend use of RW and +1 at all times for optimal safety Would benefit from skilled PT to address above deficits and promote optimal return to PLOF.; Recommend transition to HHPT upon discharge from acute hospitalization.     Follow Up Recommendations Home health PT    Equipment Recommendations   (has RW)    Recommendations for Other Services       Precautions / Restrictions Precautions Precautions: Fall Restrictions Weight Bearing Restrictions: No      Mobility  Bed Mobility Overal bed mobility: Modified Independent Bed Mobility: Supine to Sit     Supine to sit: Modified independent (Device/Increase time)        Transfers Overall transfer level: Needs assistance Equipment used: Rolling walker (2 wheeled) Transfers: Sit to/from Stand Sit to Stand: Min assist         General transfer comment: cuing for hand placement, assist for lift off  Ambulation/Gait Ambulation/Gait assistance: Min assist Gait Distance (Feet): 25 Feet Assistive device: Rolling walker (2 wheeled)       General Gait  Details: forward flexed posture with mod WBing bilat UEs; choppy stepping pattern with limited heel strike/toe off, decreased cadence and decreased balance reactions.  Do recommend use of RW and +1 at all times for optimal safety  Stairs            Wheelchair Mobility    Modified Rankin (Stroke Patients Only)       Balance Overall balance assessment: Needs assistance Sitting-balance support: No upper extremity supported;Feet supported Sitting balance-Leahy Scale: Good     Standing balance support: Bilateral upper extremity supported Standing balance-Leahy Scale: Fair                               Pertinent Vitals/Pain Pain Assessment: No/denies pain    Home Living Family/patient expects to be discharged to:: Private residence Living Arrangements: Children Available Help at Discharge: Family;Available 24 hours/day Type of Home: House Home Access: Stairs to enter Entrance Stairs-Rails: None Entrance Stairs-Number of Steps: 4 Home Layout: Multi-level;Bed/bath upstairs Home Equipment: Walker - 2 wheels      Prior Function Level of Independence: Independent with assistive device(s)         Comments: Mod indep with RW for ADLs, household and community mobilization with RW; assist from family for ADLs, household chores as needed.  Does endorse at least two falls within previous week; otherwise, denies recent fall history.l     Hand Dominance        Extremity/Trunk Assessment   Upper Extremity Assessment Upper Extremity Assessment: Generalized weakness (grossly 4/5 throughout)    Lower Extremity Assessment Lower Extremity Assessment: Generalized weakness (grossly 4-/5 throughout)  Communication   Communication: No difficulties  Cognition Arousal/Alertness: Awake/alert Behavior During Therapy: WFL for tasks assessed/performed Overall Cognitive Status: Within Functional Limits for tasks assessed                                         General Comments      Exercises     Assessment/Plan    PT Assessment Patient needs continued PT services  PT Problem List Decreased strength;Decreased range of motion;Decreased activity tolerance;Decreased balance;Decreased mobility;Decreased knowledge of use of DME;Decreased safety awareness;Decreased knowledge of precautions;Impaired sensation;Decreased coordination;Decreased cognition       PT Treatment Interventions DME instruction;Gait training;Stair training;Functional mobility training;Therapeutic activities;Therapeutic exercise;Balance training;Patient/family education    PT Goals (Current goals can be found in the Care Plan section)  Acute Rehab PT Goals Patient Stated Goal: to return home and continue with [facility] PT Goal Formulation: With patient/family Time For Goal Achievement: 06/21/20 Potential to Achieve Goals: Good    Frequency Min 2X/week   Barriers to discharge        Co-evaluation               AM-PAC PT "6 Clicks" Mobility  Outcome Measure Help needed turning from your back to your side while in a flat bed without using bedrails?: None Help needed moving from lying on your back to sitting on the side of a flat bed without using bedrails?: None Help needed moving to and from a bed to a chair (including a wheelchair)?: A Little Help needed standing up from a chair using your arms (e.g., wheelchair or bedside chair)?: A Little Help needed to walk in hospital room?: A Little Help needed climbing 3-5 steps with a railing? : A Little 6 Click Score: 20    End of Session Equipment Utilized During Treatment: Gait belt Activity Tolerance: Patient tolerated treatment well Patient left: in chair;with call bell/phone within reach;with bed alarm set;with family/visitor present;with nursing/sitter in room Nurse Communication: Mobility status PT Visit Diagnosis: Muscle weakness (generalized) (M62.81);Difficulty in walking, not elsewhere classified  (R26.2)    Time: 0100-7121 PT Time Calculation (min) (ACUTE ONLY): 24 min   Charges:   PT Evaluation $PT Eval Moderate Complexity: 1 Mod          Kaycee Mcgaugh H. Owens Shark, PT, DPT, NCS 06/07/20, 4:35 PM [phone]

## 2020-06-07 NOTE — Progress Notes (Signed)
PHARMACIST - PHYSICIAN ORDER COMMUNICATION  CONCERNING: Diltiazem and Simvastatin  and risk of rhabdomyolysis  DESCRIPTION:  Patients on diltiazem and simvastatin >10 mg/day have reported cases of rhabdomyolysis. Pharmacy is to assess simvastatin dose. If >10 mg, substitute atorvastatin (Lipitor) 1mg  for each 2mg  simvastatin.  This patient is ordered simvastatin 20 mg and diltiazem 120mg .    ACTION TAKEN: Per protocol pharmacy has discontinued the patient's order for simvastatin and replaced it with Atorvastatin 10 mg.    Pernell Dupre, PharmD, BCPS Clinical Pharmacist 06/07/2020 10:17 AM

## 2020-06-08 DIAGNOSIS — R531 Weakness: Secondary | ICD-10-CM | POA: Diagnosis not present

## 2020-06-08 LAB — BASIC METABOLIC PANEL
Anion gap: 6 (ref 5–15)
BUN: 49 mg/dL — ABNORMAL HIGH (ref 8–23)
CO2: 29 mmol/L (ref 22–32)
Calcium: 7.8 mg/dL — ABNORMAL LOW (ref 8.9–10.3)
Chloride: 107 mmol/L (ref 98–111)
Creatinine, Ser: 0.92 mg/dL (ref 0.44–1.00)
GFR calc Af Amer: >60 mL/min (ref >60)
GFR calc non Af Amer: 55 mL/min — ABNORMAL LOW (ref >60)
Glucose, Bld: 97 mg/dL (ref 70–99)
Potassium: 4.5 mmol/L (ref 3.5–5.1)
Sodium: 142 mmol/L (ref 135–145)

## 2020-06-08 LAB — CBC
HCT: 35.6 % — ABNORMAL LOW (ref 36.0–46.0)
Hemoglobin: 11 g/dL — ABNORMAL LOW (ref 12.0–15.0)
MCH: 33.2 pg (ref 26.0–34.0)
MCHC: 30.9 g/dL (ref 30.0–36.0)
MCV: 107.6 fL — ABNORMAL HIGH (ref 80.0–100.0)
Platelets: 125 10*3/uL — ABNORMAL LOW (ref 150–400)
RBC: 3.31 MIL/uL — ABNORMAL LOW (ref 3.87–5.11)
RDW: 18.2 % — ABNORMAL HIGH (ref 11.5–15.5)
WBC: 14 10*3/uL — ABNORMAL HIGH (ref 4.0–10.5)
nRBC: 0 % (ref 0.0–0.2)

## 2020-06-08 LAB — LACTIC ACID, PLASMA: Lactic Acid, Venous: 1.9 mmol/L (ref 0.5–1.9)

## 2020-06-08 MED ORDER — LISINOPRIL 5 MG PO TABS
2.5000 mg | ORAL_TABLET | Freq: Every day | ORAL | Status: DC
  Filled 2020-06-08: qty 1

## 2020-06-08 MED ORDER — FUROSEMIDE 20 MG PO TABS
10.0000 mg | ORAL_TABLET | Freq: Every day | ORAL | Status: DC
  Administered 2020-06-08: 12:00:00 10 mg via ORAL
  Filled 2020-06-08: qty 1

## 2020-06-08 NOTE — Progress Notes (Signed)
Patient Name: [name] [name] [facility] Date of Encounter: 06/08/2020  Hospital Problem List     Principal Problem:   Generalized weakness Active Problems:   Benign essential hypertension   Rapid atrial fibrillation (HCC)   Malignant neoplasm of lower lobe of left lung (HCC)   Depression   Pressure injury of skin    Patient Profile     86 y.o. female with history of htn, cad s/p pci of rca in 2002 and 2003, history of tricuspid and mitral insufficiency, paroxysmal afib  Treated with eliquis for anticoagulation, renal artery stenosis, ckd iii, history of lung ca admitted after presenting to the er with complaints of weakness for several days and afib with rvr. Has ruled out for an mi with mild hs troponin elevation secondary to demand. Magnesium was 2.5, creatinine 1.08 and k of 4.6. CXR showed no new pneumonia. Head ct showed no acute abnormality. Currently treated with eliquis 2.5 bid, atorvastatin 10 mg daily, diltiazem 120 daiily, lasix 10 daily, lisinopril 2.5 daily, metoprolo l25 daily. EKG on admission showed afib with rvr at 125. Recently admitted with uti with no current evidence of uti. Rate improved on home meds. She currently states she feels well with no further weakness. Review of telemetry reveals afib with controlled vr with intermitant abarant beats.   Subjective   Feels better. No change in rhythm  Inpatient Medications    . apixaban  2.5 mg Oral BID  . atorvastatin  10 mg Oral QHS  . clotrimazole  10 mg Oral 5 X Daily  . dexamethasone  4 mg Oral Daily  . diltiazem  120 mg Oral Daily  . feeding supplement  1 Container Oral TID BM  . metoprolol succinate  25 mg Oral BID  . mirtazapine  7.5 mg Oral QHS  . multivitamin with minerals  1 tablet Oral Daily  . multivitamin-lutein  1 capsule Oral BID  . nicotine  14 mg Transdermal Daily    Vital Signs    Vitals:   06/07/20 1924 06/07/20 2139 06/08/20 0018 06/08/20 0903  BP: 98/66 (!) 118/91 118/80 (!) 129/86   Pulse: (!) 120 104 66 95  Resp: 16 17 17 15   Temp: 97.9 F (36.6 C) 97.8 F (36.6 C) 98.1 F (36.7 C) 97.7 F (36.5 C)  TempSrc: Oral Oral    SpO2: 98% 100% 99% 97%  Weight:      Height:        Intake/Output Summary (Last 24 hours) at 06/08/2020 1145 Last data filed at 06/08/2020 1005 Gross per 24 hour  Intake 480 ml  Output --  Net 480 ml   Filed Weights   06/06/20 0730  Weight: 51.4 kg    Physical Exam    GEN: Well nourished, well developed, in no acute distress.  HEENT: normal.  Neck: Supple, no JVD, carotid bruits, or masses. Cardiac: irr, irr No clubbing, cyanosis, edema.  Radials/DP/PT 2+ and equal bilaterally.  Respiratory:  Respirations regular and unlabored, clear to auscultation bilaterally. GI: Soft, nontender, nondistended, BS + x 4. MS: no deformity or atrophy. Skin: warm and dry, no rash. Neuro:  Strength and sensation are intact. Psych: Normal affect.  Labs    CBC Recent Labs    06/06/20 0743 06/06/20 0743 06/07/20 0613 06/08/20 0542  WBC 17.4*   < > 14.7* 14.0*  NEUTROABS 14.1*  --   --   --   HGB 11.8*   < > 10.6* 11.0*  HCT 37.7   < >  32.4* 35.6*  MCV 107.4*   < > 102.5* 107.6*  PLT 169   < > 131* 125*   < > = values in this interval not displayed.   Basic Metabolic Panel Recent Labs    06/07/20 0613 06/07/20 1213 06/08/20 0542  NA 142  --  142  K 4.6  --  4.5  CL 104  --  107  CO2 32  --  29  GLUCOSE 104*  --  97  BUN 63*  --  49*  CREATININE 1.08*  --  0.92  CALCIUM 8.4*  --  7.8*  MG  --  2.5*  --    Liver Function Tests Recent Labs    06/06/20 0743  AST 38  ALT 42  ALKPHOS 79  BILITOT 1.1  PROT 6.2*  ALBUMIN 3.4*   No results for input(s): LIPASE, AMYLASE in the last 72 hours. Cardiac Enzymes Recent Labs    06/07/20 1213  CKTOTAL 29*   BNP No results for input(s): BNP in the last 72 hours. D-Dimer No results for input(s): DDIMER in the last 72 hours. Hemoglobin A1C No results for input(s): HGBA1C in  the last 72 hours. Fasting Lipid Panel No results for input(s): CHOL, HDL, LDLCALC, TRIG, CHOLHDL, LDLDIRECT in the last 72 hours. Thyroid Function Tests No results for input(s): TSH, T4TOTAL, T3FREE, THYROIDAB in the last 72 hours.  Invalid input(s): FREET3  Telemetry    afib with variable but controlled vr  ECG    afib with rvr  Radiology    DG Chest 2 View  Result Date: 06/06/2020 CLINICAL DATA:  Lung cancer.  Atrial fibrillation.  Weakness. EXAM: CHEST - 2 VIEW COMPARISON:  04/06/2020 FINDINGS: Heart size and vascularity normal. Atherosclerotic calcification in the thoracic aorta. COPD with hyperinflation. Right upper lobe linear density unchanged. Patchy airspace disease in the right lung base shows interval improvement. Left lung clear. No new area of infiltrate or effusion. Right coronary artery stent. IMPRESSION: COPD. Improvement in right lower lobe airspace disease. No new area of pneumonia. Electronically Signed   By: Franchot Gallo M.D.   On: 06/06/2020 08:37   CT Head Wo Contrast  Result Date: 06/06/2020 CLINICAL DATA:  Head trauma, minor; fall on Eliquis, evaluate for ICH. EXAM: CT HEAD WITHOUT CONTRAST TECHNIQUE: Contiguous axial images were obtained from the base of the skull through the vertex without intravenous contrast. COMPARISON:  Head CT 03/24/2005 FINDINGS: Brain: Moderate generalized parenchymal atrophy, progressed as compared to the head CT of 03/23/2005. Moderate patchy hypoattenuation within the cerebral white matter, also progressed as compared to the prior CT. Small left parietal lobe chronic white matter infarct. There is no acute intracranial hemorrhage. No demarcated cortical infarct. No extra-axial fluid collection. No evidence of intracranial mass. No midline shift. Vascular: No hyperdense vessel.  Atherosclerotic calcifications. Skull: Normal. Negative for fracture or focal lesion. Sinuses/Orbits: Visualized orbits show no acute finding. Mild scattered  paranasal sinus mucosal thickening at the imaged levels. Small left sphenoid sinus mucous retention cyst. No significant mastoid effusion at the imaged levels. IMPRESSION: No evidence of acute intracranial abnormality. Moderate generalized parenchymal atrophy and chronic small vessel ischemic disease, progressed as compared to the prior head CT of 03/23/2005. Mild paranasal sinus mucosal thickening. Small left sphenoid sinus mucous retention cyst. Electronically Signed   By: Kellie Simmering DO   On: 06/06/2020 08:51   CT ABDOMEN PELVIS W CONTRAST  Result Date: 06/06/2020 CLINICAL DATA:  Abdominal pain, acute with RIGHT upper quadrant and  LEFT lower quadrant pain. EXAM: CT ABDOMEN AND PELVIS WITH CONTRAST TECHNIQUE: Multidetector CT imaging of the abdomen and pelvis was performed using the standard protocol following bolus administration of intravenous contrast. CONTRAST:  33mL OMNIPAQUE IOHEXOL 300 MG/ML  SOLN COMPARISON:  November 01, 2019 FINDINGS: Lower chest: Signs of cylindrical bronchiectasis and inspissated material within RIGHT lower lobe bronchi. Overlying the RIGHT hemidiaphragm is an area of nodularity measuring 2.3 x 1.4 cm this has not changed significantly in the interval since December of 2020, perhaps even slightly smaller though position of the RIGHT hemidiaphragm could change appearance. Marked dilation of the distal thoracic aorta with tortuosity and eccentric plaque measuring approximately 4.2 cm greatest axial dimension similar to November of 2019. Due to tortuosity measurements could be exaggerated. Measurement in long axis is 5.1 cm. This is unchanged and likely exaggerated by the aortic tortuosity at this level in the lower chest. Signs of prior percutaneous coronary intervention. No pleural effusion. Pulmonary emphysema. Hepatobiliary: Mildly lobular hepatic contours. No focal, suspicious hepatic lesion. No biliary duct dilation. No pericholecystic stranding. Pancreas: 1.9 x 1.8 cm cystic  lesion in the head of the pancreas (image 24, series 2) no main duct dilation or peripancreatic inflammation. Spleen: Spleen normal in size and contour. Adrenals/Urinary Tract: LEFT adrenal with enlargement and calcification measuring approximately 2.0 x 1.8 cm in greatest axial dimension. Given position of adrenal with inspiratory effort this could be altered but it appears that it may be smaller, when measured in a similar location previously it measured approximately 2.5 x 1.8 cm, measuring approximately 1.5 cm greatest thickness on today's study more superiorly near the calcification previously approximately 1.9 cm in a similar location. The RIGHT adrenal is normal. Urinary bladder is distended. No hydronephrosis. Renal cortical scarring. Small cysts in the kidneys. Stomach/Bowel: No acute gastrointestinal process. Colonic diverticulosis. Vascular/Lymphatic: Vascular disease with infrarenal abdominal aortic aneurysm measuring approximately 4.8 x 3.8 cm just below the renal arteries. The aortic tortuosity showing a similar appearance. Tortuosity exaggerating luminal size, appearance similar to the previous exam with large amount of eccentric mural thrombus/soft plaque along the RIGHT lateral wall at the area of greatest dilation. 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 No periaortic stranding. Patent visceral branches despite severe atheromatous changes. No adenopathy in the upper abdomen or in the retroperitoneum. Small retrocrural lymph nodes are stable. No pelvic adenopathy. Reproductive: Post hysterectomy with signs of pelvic floor dysfunction with bowing of pelvic floor musculature. Other: No ascites.  No free air. Musculoskeletal: No acute bone finding. No destructive bone process. IMPRESSION: No CT signs of acute abnormality in the abdomen or pelvis post infectious  changes in the RIGHT lower lobe along with a more focal nodular area at the RIGHT lung base above the RIGHT hemidiaphragm potentially post infectious scarring. Given patient history would suggest close attention on follow-up. Thoracic and abdominal aneurysmal dilation and severe vascular disease with tortuosity as described. Findings not changed since prior imaging. Stable cystic pancreatic lesion, attention on follow-up. Electronically Signed   By: Zetta Bills M.D.   On: 06/06/2020 09:47   NM PET Image Restag (PS) Skull Base To Thigh  Result Date: 05/10/2020 CLINICAL DATA:  Subsequent treatment strategy for lung cancer including left lower lobe mass and prior right upper lobe primary. Interval radiation therapy of right lower lobe and left lower lobe lesions. EXAM: NUCLEAR MEDICINE PET  SKULL BASE TO THIGH TECHNIQUE: 6.1 mCi F-18 FDG was injected intravenously. Full-ring PET imaging was performed from the skull base to thigh after the radiotracer. CT data was obtained and used for attenuation correction and anatomic localization. Fasting blood glucose: 87 mg/dl COMPARISON:  PET-CT of 11/09/2019 and CT chest from 02/17/2020 FINDINGS: Mediastinal blood pool activity: SUV max 2.3 Liver activity: SUV max NA NECK: No significant abnormal hypermetabolic activity in this region. Incidental CT findings: Bilateral common carotid atherosclerotic calcification. CHEST: Interval marked reduction in the left lower lobe para-aortic mass compared to prior PET-CT and compared to CT chest of 02/17/2020, with the soft tissue thickness in this vicinity measuring 1.1 cm on image 107/3 (formerly about 2.5 cm). Maximum SUV in this region of treated tumor is currently 3.6, previously 15.5. Some of the current activity could be related to local radiation pneumonitis. A band of density in the right upper lobe has similar morphology to prior exams with maximum SUV of 2.1 (formerly 1.9). Airway thickening and mild consolidation at the  right lung base worsened from 11/09/2019 but mostly similar to 02/17/2020, with associated airway plugging and a maximum SUV along the region of volume loss and consolidation of 4.9. Active inflammation favored over tumor as a cause, although surveillance is likely warranted. Notable diffuse esophageal activity observed with maximum SUV 4.0, probably incidental/physiologic. No prominent or hypermetabolic subcarinal adenopathy at this time. Incidental CT findings: Small residual right pleural effusion. The left pleural effusion has resolved. Coronary, aortic arch, and branch vessel atherosclerotic vascular disease. Stable descending thoracic aortic aneurysm. Centrilobular emphysema. ABDOMEN/PELVIS: Low-density left adrenal lesion appears stable and favors adenoma based on low density characteristics, maximum SUV 4.2, formerly 2.9. Physiologic activity bowel, most striking in the cecum where there is focal metabolic activity up to 80.9 but no CT correlate or significant abnormality in this vicinity on the prior exam, hence likely incidental. Incidental CT findings: Stable abdominal aortic aneurysm. Upper portion measures up to 4.8 cm transverse. Abdominal aortic atherosclerosis. Sigmoid colon diverticulosis. Suspected pelvic floor laxity. SKELETON: No significant abnormal hypermetabolic activity in this region. Incidental CT findings: Multiple healing right-sided rib fractures including the right fourth seventh, eighth, ninth, and tenth ribs, first shown on the rib radiographs from 11/28/2019. IMPRESSION: 1. Marked improvement in the left lower lobe mass adjacent to the descending thoracic aorta. Tissue in the vicinity of the mass currently measures only about 1.1 cm in thickness, with maximum SUV of 3.6, previously 15.5. 2. Mild progression in airway thickening, airway plugging, and consolidation in the right lower lobe, favoring an infectious etiology. Maximum SUV 4.9. Surveillance is likely warranted. 3. Reduced  right pleural effusion and resolved left pleural effusion. 4. Other imaging findings of potential clinical significance: Aortic Atherosclerosis (ICD10-I70.0). Coronary and carotid atherosclerotic calcification. Left adrenal adenoma. Descending thoracic and abdominal aortic aneurysms. Sigmoid colon diverticulosis. Pelvic floor laxity. Late phase healing of multiple right-sided rib fractures appear Electronically Signed   By: Van Clines M.D.   On: 05/10/2020 13:16    Assessment & Plan    Doing well from cardiac standpoint. OK for discharge on current meds.   Signed, Javier Docker Zuleika Gallus MD 06/08/2020, 11:45 AM  Pager: [phone]

## 2020-06-08 NOTE — Care Management CC44 (Signed)
Condition Code 44 Documentation Completed  Patient Details  Name: [name] [name] [facility] MRN: [id number] Date of Birth: 1927-12-31   Condition Code 44 given:  Yes Patient signature on Condition Code 44 notice:  Yes Documentation of 2 MD's agreement:  Yes Code 44 added to claim:  Yes    Shelbie Ammons, RN 06/08/2020, 9:49 AM

## 2020-06-08 NOTE — Discharge Summary (Signed)
Physician Discharge Summary   [name] [name]  female DOB: 1927/11/21  FXT:[id number]  PCP: Ezequiel Kayser, MD  Admit date: 06/06/2020 Discharge date: 06/08/2020  Admitted From: home Disposition:  Home with HHPT/OT Daughter updated at bedside prior to discharge. Home Health: Yes CODE STATUS: DNR  Discharge Instructions    Discharge instructions   Complete by: As directed    Please make sure to stay hydrated.   Please check blood pressure at home to make sure systolic (top number) remains above 100's.   Dr. Enzo Bi - -   No wound care   Complete by: As directed        Hospital Course:  For full details, please see H&P, progress notes, consult notes and ancillary notes.  Briefly,  [name] [name] a 86 y.o.Caucasian femalewith medical history significantforatrial fibrillation on Eliquis,lung cancer completed 5 cycles ofchemotherapyand 6 weeks ofradiation therapy.  Patient was brought into the ER by EMSandin the field patientwas noted to have a heart rate between 140 and 180, blood pressure 144/100.Her daughter was concerned about pt's generalized weakness for about 2 to 3 days. Patient has been treated for UTI twice in the last 1 month, 06/14she had a urine culture yielded E. coli and on 6/22 urine culture yielded E. coli and Pseudomonas. Patient completed a course of antibiotic therapy and was back to her baseline until the last 3 days when she developed generalized weakness which daughter stated is the way she presents when she has a UTI.Patient complained of urinary incontinence but denied having any dysuria or frequency. Patient had a fall about 2 days ago,daughter stated pt slipped while bending over to pick something.   Generalized weakness Most likely multifactorial, advanced age with underlying malignancy and recent UTIs.  Daughter also believed pt was dehydrated.  Pt felt better after IVF hydration.  Patient did had a mechanical  fall as she was bending over to reach on something on floor and lost balance.  No concern for any syncopal episode.  PT eval rec HHPT which is already currently active.  A. Fib. on Eliquis Patient did had some air RVR on presentation.  Currently rate controlled on home meds.  Cardiology consulted and recommended no change.  Continued home dose of diltiazem, metoprolol and Eliquis.  Leukocytosis, improved No obvious infection.  Most likely a leukemoid reaction secondary to recent systemic steroid use.  Daughter was very worried about UTI however UA non-infectious.  2 recent treatments for presumed UTI with antibiotics.  No need for antibiotics during this admission.  Elevated lactic acid, resolved BMP without any acidosis.  Bicarb within normal limit.  Unclear etiology, no obvious source of infection.  Checked CK which was within normal limit.  Lactic acid level normalized prior to discharge after IVF.  History of lung cancer. Being followed up by oncology as an outpatient.  Depression. Continued mirtazapine  Leg swelling Daughter reported pt has to take Lasix 10 mg daily and that even taking it every other day resulted in worsening leg and face swelling.  Lasix continued, though cautioned on dropping BP.  CKD 3a Daughter reported pt has only 1 working kidney (1 atrophied) and was prescribed Lisinopril by nephrology for kidney protection.  Lisnopirl continued, thought cautioned on dropping BP.   Discharge Diagnoses:  Principal Problem:   Generalized weakness Active Problems:   Benign essential hypertension   Rapid atrial fibrillation (HCC)   Malignant neoplasm of lower lobe of left lung (HCC)   Depression   Pressure  injury of skin    Discharge Instructions:  Allergies as of 06/08/2020      Reactions   Bactrim [sulfamethoxazole-trimethoprim] Other (See Comments)   Reduced kidney function.  Per Dr. Holley Raring should not take   Ibuprofen    Reduced kidney function. Per Dr.  Holley Raring should not take   Macrobid [nitrofurantoin Monohyd Macro] Other (See Comments)   Reduced Kidney function.  Should no longer take per Dr. Holley Raring   Tape Rash      Medication List    STOP taking these medications   Oyster Shell Calcium 500-400 MG-UNIT Tabs     TAKE these medications   clotrimazole 10 MG troche Commonly known as: MYCELEX Take 10 mg by mouth 5 (five) times daily.   dexamethasone 4 MG tablet Commonly known as: DECADRON Take 1 tablet (4 mg total) by mouth daily.   diltiazem 120 MG 24 hr capsule Commonly known as: CARDIZEM CD Take 1 capsule (120 mg total) by mouth daily.   Eliquis 2.5 MG Tabs tablet Generic drug: apixaban Take 2.5 mg by mouth 2 (two) times daily.   feeding supplement Liqd Take 1 Container by mouth 3 (three) times daily between meals.   furosemide 20 MG tablet Commonly known as: LASIX Take 0.5 tablets (10 mg total) by mouth daily.   lisinopril 2.5 MG tablet Commonly known as: ZESTRIL Take 2.5 mg by mouth at bedtime.   metoprolol succinate 25 MG 24 hr tablet Commonly known as: TOPROL-XL Take 25 mg by mouth 2 (two) times daily.   mirtazapine 7.5 MG tablet Commonly known as: REMERON Take 7.5 mg by mouth at bedtime.   multivitamin with minerals Tabs tablet Take 1 tablet by mouth daily.   ondansetron 8 MG tablet Commonly known as: Zofran Take 1 tablet (8 mg total) by mouth 2 (two) times daily as needed for refractory nausea / vomiting.   PRESERVISION AREDS PO Take 1 tablet by mouth 2 (two) times daily.   ProAir HFA 108 (90 Base) MCG/ACT inhaler Generic drug: albuterol Inhale 2 puffs into the lungs every 6 (six) hours as needed for wheezing or shortness of breath.   simvastatin 20 MG tablet Commonly known as: ZOCOR Take 20 mg by mouth at bedtime.   Taclonex ointment Generic drug: calcipotriene-betamethasone Apply 1 application topically daily. To legs and back as needed   traMADol 50 MG tablet Commonly known as:  Ultram Take 1 tablet (50 mg total) by mouth every 12 (twelve) hours as needed.        Follow-up Information    Ezequiel Kayser, MD. Schedule an appointment as soon as possible for a visit in 1 week(s).   Specialty: Internal Medicine Contact information: [place] [address] [phone]               Allergies  Allergen Reactions  . Bactrim [Sulfamethoxazole-Trimethoprim] Other (See Comments)    Reduced kidney function.  Per Dr. Holley Raring should not take  . Ibuprofen     Reduced kidney function. Per Dr. Holley Raring should not take  . Macrobid [employer] Macro] Other (See Comments)    Reduced Kidney function.  Should no longer take per Dr. Holley Raring  . Tape Rash     The results of significant diagnostics from this hospitalization (including imaging, microbiology, ancillary and laboratory) are listed below for reference.   Consultations:   Procedures/Studies: DG Chest 2 View  Result Date: 06/06/2020 CLINICAL DATA:  Lung cancer.  Atrial fibrillation.  Weakness. EXAM: CHEST - 2  VIEW COMPARISON:  04/06/2020 FINDINGS: Heart size and vascularity normal. Atherosclerotic calcification in the thoracic aorta. COPD with hyperinflation. Right upper lobe linear density unchanged. Patchy airspace disease in the right lung base shows interval improvement. Left lung clear. No new area of infiltrate or effusion. Right coronary artery stent. IMPRESSION: COPD. Improvement in right lower lobe airspace disease. No new area of pneumonia. Electronically Signed   By: Franchot Gallo M.D.   On: 06/06/2020 08:37   CT Head Wo Contrast  Result Date: 06/06/2020 CLINICAL DATA:  Head trauma, minor; fall on Eliquis, evaluate for ICH. EXAM: CT HEAD WITHOUT CONTRAST TECHNIQUE: Contiguous axial images were obtained from the base of the skull through the vertex without intravenous contrast. COMPARISON:  Head CT 03/24/2005 FINDINGS: Brain: Moderate generalized parenchymal atrophy, progressed  as compared to the head CT of 03/23/2005. Moderate patchy hypoattenuation within the cerebral white matter, also progressed as compared to the prior CT. Small left parietal lobe chronic white matter infarct. There is no acute intracranial hemorrhage. No demarcated cortical infarct. No extra-axial fluid collection. No evidence of intracranial mass. No midline shift. Vascular: No hyperdense vessel.  Atherosclerotic calcifications. Skull: Normal. Negative for fracture or focal lesion. Sinuses/Orbits: Visualized orbits show no acute finding. Mild scattered paranasal sinus mucosal thickening at the imaged levels. Small left sphenoid sinus mucous retention cyst. No significant mastoid effusion at the imaged levels. IMPRESSION: No evidence of acute intracranial abnormality. Moderate generalized parenchymal atrophy and chronic small vessel ischemic disease, progressed as compared to the prior head CT of 03/23/2005. Mild paranasal sinus mucosal thickening. Small left sphenoid sinus mucous retention cyst. Electronically Signed   By: Kellie Simmering DO   On: 06/06/2020 08:51   CT ABDOMEN PELVIS W CONTRAST  Result Date: 06/06/2020 CLINICAL DATA:  Abdominal pain, acute with RIGHT upper quadrant and LEFT lower quadrant pain. EXAM: CT ABDOMEN AND PELVIS WITH CONTRAST TECHNIQUE: Multidetector CT imaging of the abdomen and pelvis was performed using the standard protocol following bolus administration of intravenous contrast. CONTRAST:  68mL OMNIPAQUE IOHEXOL 300 MG/ML  SOLN COMPARISON:  November 01, 2019 FINDINGS: Lower chest: Signs of cylindrical bronchiectasis and inspissated material within RIGHT lower lobe bronchi. Overlying the RIGHT hemidiaphragm is an area of nodularity measuring 2.3 x 1.4 cm this has not changed significantly in the interval since December of 2020, perhaps even slightly smaller though position of the RIGHT hemidiaphragm could change appearance. Marked dilation of the distal thoracic aorta with tortuosity  and eccentric plaque measuring approximately 4.2 cm greatest axial dimension similar to November of 2019. Due to tortuosity measurements could be exaggerated. Measurement in long axis is 5.1 cm. This is unchanged and likely exaggerated by the aortic tortuosity at this level in the lower chest. Signs of prior percutaneous coronary intervention. No pleural effusion. Pulmonary emphysema. Hepatobiliary: Mildly lobular hepatic contours. No focal, suspicious hepatic lesion. No biliary duct dilation. No pericholecystic stranding. Pancreas: 1.9 x 1.8 cm cystic lesion in the head of the pancreas (image 24, series 2) no main duct dilation or peripancreatic inflammation. Spleen: Spleen normal in size and contour. Adrenals/Urinary Tract: LEFT adrenal with enlargement and calcification measuring approximately 2.0 x 1.8 cm in greatest axial dimension. Given position of adrenal with inspiratory effort this could be altered but it appears that it may be smaller, when measured in a similar location previously it measured approximately 2.5 x 1.8 cm, measuring approximately 1.5 cm greatest thickness on today's study more superiorly near the calcification previously approximately 1.9 cm in a similar location.  The RIGHT adrenal is normal. Urinary bladder is distended. No hydronephrosis. Renal cortical scarring. Small cysts in the kidneys. Stomach/Bowel: No acute gastrointestinal process. Colonic diverticulosis. Vascular/Lymphatic: Vascular disease with infrarenal abdominal aortic aneurysm measuring approximately 4.8 x 3.8 cm just below the renal arteries. The aortic tortuosity showing a similar appearance. Tortuosity exaggerating luminal size, appearance similar to the previous exam with large amount of eccentric mural thrombus/soft plaque along the RIGHT lateral wall at the area of greatest dilation. Chronic focal non flow limiting dissection in the distal portion of the aorta just above the iliac bifurcation with maximal caliber  proximally 2.9 cm at this level, less soft plaque. No periaortic stranding. Patent visceral branches despite severe atheromatous changes. No adenopathy in the upper abdomen or in the retroperitoneum. Small retrocrural lymph nodes are stable. No pelvic adenopathy. Reproductive: Post hysterectomy with signs of pelvic floor dysfunction with bowing of pelvic floor musculature. Other: No ascites.  No free air. Musculoskeletal: No acute bone finding. No destructive bone process. IMPRESSION: No CT signs of acute abnormality in the abdomen or pelvis post infectious changes in the RIGHT lower lobe along with a more focal nodular area at the RIGHT lung base above the RIGHT hemidiaphragm potentially post infectious scarring. Given patient history would suggest close attention on follow-up. Thoracic and abdominal aneurysmal dilation and severe vascular disease with tortuosity as described. Findings not changed since prior imaging. Stable cystic pancreatic lesion, attention on follow-up. Electronically Signed   By: Zetta Bills M.D.   On: 06/06/2020 09:47   NM PET Image Restag (PS) Skull Base To Thigh  Result Date: 05/10/2020 CLINICAL DATA:  Subsequent treatment strategy for lung cancer including left lower lobe mass and prior right upper lobe primary. Interval radiation therapy of right lower lobe and left lower lobe lesions. EXAM: NUCLEAR MEDICINE PET SKULL BASE TO THIGH TECHNIQUE: 6.1 mCi F-18 FDG was injected intravenously. Full-ring PET imaging was performed from the skull base to thigh after the radiotracer. CT data was obtained and used for attenuation correction and anatomic localization. Fasting blood glucose: 87 mg/dl COMPARISON:  PET-CT of 11/09/2019 and CT chest from 02/17/2020 FINDINGS: Mediastinal blood pool activity: SUV max 2.3 Liver activity: SUV max NA NECK: No significant abnormal hypermetabolic activity in this region. Incidental CT findings: Bilateral common carotid atherosclerotic calcification.  CHEST: Interval marked reduction in the left lower lobe para-aortic mass compared to prior PET-CT and compared to CT chest of 02/17/2020, with the soft tissue thickness in this vicinity measuring 1.1 cm on image 107/3 (formerly about 2.5 cm). Maximum SUV in this region of treated tumor is currently 3.6, previously 15.5. Some of the current activity could be related to local radiation pneumonitis. A band of density in the right upper lobe has similar morphology to prior exams with maximum SUV of 2.1 (formerly 1.9). Airway thickening and mild consolidation at the right lung base worsened from 11/09/2019 but mostly similar to 02/17/2020, with associated airway plugging and a maximum SUV along the region of volume loss and consolidation of 4.9. Active inflammation favored over tumor as a cause, although surveillance is likely warranted. Notable diffuse esophageal activity observed with maximum SUV 4.0, probably incidental/physiologic. No prominent or hypermetabolic subcarinal adenopathy at this time. Incidental CT findings: Small residual right pleural effusion. The left pleural effusion has resolved. Coronary, aortic arch, and branch vessel atherosclerotic vascular disease. Stable descending thoracic aortic aneurysm. Centrilobular emphysema. ABDOMEN/PELVIS: Low-density left adrenal lesion appears stable and favors adenoma based on low density characteristics, maximum SUV 4.2,  formerly 2.9. Physiologic activity bowel, most striking in the cecum where there is focal metabolic activity up to 10.6 but no CT correlate or significant abnormality in this vicinity on the prior exam, hence likely incidental. Incidental CT findings: Stable abdominal aortic aneurysm. Upper portion measures up to 4.8 cm transverse. Abdominal aortic atherosclerosis. Sigmoid colon diverticulosis. Suspected pelvic floor laxity. SKELETON: No significant abnormal hypermetabolic activity in this region. Incidental CT findings: Multiple healing  right-sided rib fractures including the right fourth seventh, eighth, ninth, and tenth ribs, first shown on the rib radiographs from 11/28/2019. IMPRESSION: 1. Marked improvement in the left lower lobe mass adjacent to the descending thoracic aorta. Tissue in the vicinity of the mass currently measures only about 1.1 cm in thickness, with maximum SUV of 3.6, previously 15.5. 2. Mild progression in airway thickening, airway plugging, and consolidation in the right lower lobe, favoring an infectious etiology. Maximum SUV 4.9. Surveillance is likely warranted. 3. Reduced right pleural effusion and resolved left pleural effusion. 4. Other imaging findings of potential clinical significance: Aortic Atherosclerosis (ICD10-I70.0). Coronary and carotid atherosclerotic calcification. Left adrenal adenoma. Descending thoracic and abdominal aortic aneurysms. Sigmoid colon diverticulosis. Pelvic floor laxity. Late phase healing of multiple right-sided rib fractures appear Electronically Signed   By: Van Clines M.D.   On: 05/10/2020 13:16      Labs: BNP (last 3 results) Recent Labs    02/17/20 1921 02/18/20 0405 04/06/20 1222  BNP 713.0* 588.0* 269.4*   Basic Metabolic Panel: Recent Labs  Lab 06/06/20 0743 06/07/20 0613 06/07/20 1213 06/08/20 0542  NA 143 142  --  142  K 5.0 4.6  --  4.5  CL 103 104  --  107  CO2 29 32  --  29  GLUCOSE 83 104*  --  97  BUN 67* 63*  --  49*  CREATININE 1.12* 1.08*  --  0.92  CALCIUM 9.1 8.4*  --  7.8*  MG  --   --  2.5*  --    Liver Function Tests: Recent Labs  Lab 06/06/20 0743  AST 38  ALT 42  ALKPHOS 79  BILITOT 1.1  PROT 6.2*  ALBUMIN 3.4*   No results for input(s): LIPASE, AMYLASE in the last 168 hours. No results for input(s): AMMONIA in the last 168 hours. CBC: Recent Labs  Lab 06/06/20 0743 06/07/20 0613 06/08/20 0542  WBC 17.4* 14.7* 14.0*  NEUTROABS 14.1*  --   --   HGB 11.8* 10.6* 11.0*  HCT 37.7 32.4* 35.6*  MCV 107.4*  102.5* 107.6*  PLT 169 131* 125*   Cardiac Enzymes: Recent Labs  Lab 06/07/20 1213  CKTOTAL 29*   BNP: Invalid input(s): POCBNP CBG: No results for input(s): GLUCAP in the last 168 hours. D-Dimer No results for input(s): DDIMER in the last 72 hours. Hgb A1c No results for input(s): HGBA1C in the last 72 hours. Lipid Profile No results for input(s): CHOL, HDL, LDLCALC, TRIG, CHOLHDL, LDLDIRECT in the last 72 hours. Thyroid function studies No results for input(s): TSH, T4TOTAL, T3FREE, THYROIDAB in the last 72 hours.  Invalid input(s): FREET3 Anemia work up No results for input(s): VITAMINB12, FOLATE, FERRITIN, TIBC, IRON, RETICCTPCT in the last 72 hours. Urinalysis    Component Value Date/Time   COLORURINE YELLOW (A) 06/06/2020 0821   APPEARANCEUR CLEAR (A) 06/06/2020 0821   APPEARANCEUR Clear 03/05/2015 2050   LABSPEC 1.009 06/06/2020 0821   LABSPEC 1.020 03/05/2015 2050   PHURINE 6.0 06/06/2020 0821   GLUCOSEU NEGATIVE 06/06/2020  [place] Negative 03/05/2015 2050   HGBUR NEGATIVE 06/06/2020 0821   BILIRUBINUR NEGATIVE 06/06/2020 0821   BILIRUBINUR Negative 03/05/2015 2050   KETONESUR NEGATIVE 06/06/2020 0821   PROTEINUR NEGATIVE 06/06/2020 0821   NITRITE NEGATIVE 06/06/2020 0821   LEUKOCYTESUR NEGATIVE 06/06/2020 0821   LEUKOCYTESUR Trace 03/05/2015 2050   Sepsis Labs Invalid input(s): PROCALCITONIN,  WBC,  LACTICIDVEN Microbiology Recent Results (from the past 240 hour(s))  Urine culture     Status: None   Collection Time: 06/06/20  8:21 AM   Specimen: Urine, Random  Result Value Ref Range Status   Specimen Description   Final    URINE, RANDOM Performed at [facility], [address], [place], [place] [address]    Special Requests   Final    NONE Performed at [facility], [address], [place], [place] [address]    Culture   Final    NO GROWTH Performed at [facility], [place] [address], [place], [place] [address]     Report Status 06/07/2020 FINAL  Final     Total time spend on discharging this patient, including the last patient exam, discussing the hospital stay, instructions for ongoing care as it relates to all pertinent caregivers, as well as preparing the medical discharge records, prescriptions, and/or referrals as applicable, is 40 minutes.    Enzo Bi, MD  [facility] 06/08/2020, 12:19 PM  If 7PM-7AM, please contact night-coverage

## 2020-06-10 ENCOUNTER — Other Ambulatory Visit: Payer: Self-pay | Admitting: Oncology

## 2020-06-10 DIAGNOSIS — C3432 Malignant neoplasm of lower lobe, left bronchus or lung: Secondary | ICD-10-CM

## 2020-06-11 ENCOUNTER — Other Ambulatory Visit: Payer: Self-pay

## 2020-06-11 ENCOUNTER — Other Ambulatory Visit: Payer: Medicare Other | Admitting: Primary Care

## 2020-06-11 DIAGNOSIS — Z515 Encounter for palliative care: Secondary | ICD-10-CM

## 2020-06-11 DIAGNOSIS — C3432 Malignant neoplasm of lower lobe, left bronchus or lung: Secondary | ICD-10-CM

## 2020-06-11 NOTE — Progress Notes (Signed)
[facility] Pall[facility] Care Consult Note Telephone: [phone]  Fax: [phone]  PATIENT NAME: [name] [name] [address] [place] [address] [id number] (home)  DOB: [birth year] MRN: [id number]  PRIMARY CARE PROVIDER:    Ezequiel Kayser, MD,  [place] [address] [phone]  REFERRING PROVIDER:   Ezequiel Kayser, MD [place] [facility] [place],  [place] [address] [phone]  RESPONSIBLE PARTY:   Extended Emergency Contact Information Primary Emergency Contact: Eidson,Helen Address: [address]          [place], [place] [address] [place] Phone: [phone] Mobile Phone: [phone] Relation: Daughter  I met face to face with patient and family in home.  ASSESSMENT AND RECOMMENDATIONS:   1. Advance Care Planning/Goals of Care: Goals include to maximize quality of life and symptom management. MOSt on file with DNR, limited scope. Patient states this is still her wish.  2. Symptom Management:   Mentation/ Delirium: Family reports some confusion and possible hallucinations. Having trouble sleeping at hs; awakens at 2 am and sits up for rest of night til 6 am. Taking Mirtazipine 7.5 mg x 7 days but has had some 2 am  awakening. Discussed pain at hs and using Acetaminophen CR 650 mg . Denies sundowning , excessive daytime sleeping.  Recommend to increase mirtazipine to 15 mg at hs. Family will trial. Recommend tylenol cr at hs and then if not sufficient use 50 mg tramadol. Recommend daily schedules and reduced day time sleep.  Dehydration: Recently in hospital with dehydration. She endorses she drinks crystal light drinks and won't drink water. Recommended to drink 16 oz bottles x 4 at a minimum and avoid excessive sugar. Recommend hydration schedule.  Dyspnea: At baseline. Daily smoker although she is considering use of a nicotine patch. Not using  oxygen. Mentation does not seem to be hypoxic in nature at this review.  3. Follow up Palliative Care Visit: Palliative care will continue to follow for goals of care clarification and symptom management. Return 2-3 weeks or prn.  4. Family /Caregiver/Community Supports:  Lives in home with daughter and other children also help. Denies social services needs at this time  5. Cognitive / Functional decline: A and O today, family endorses some transient delirium from her hospital stay last week. She's been home several days. I provided education RE delirium management, esp trying to ensure adequate sleep and hydration. Some one is with her most of the time. Functionally she is weak, has had a fall. Has w/c which she does not propel. Uses walker and is unsteady.  I spent 40 minutes providing this consultation,  from 1115 to 1155. More than 50% of the time in this consultation was spent coordinating communication.   CHIEF COMPLAINT: confusion, insomnia  HISTORY OF PRESENT ILLNESS:  [name] [name] is a 86 y.o. year old female with multiple medical problems including lung cancer, tobacco abuse, insomnia, confusion, , ho breast cancer, falls. Palliative Care was asked to follow this patient by consultation request of Ezequiel Kayser, MD to help address advance care planning and goals of care. This is a follow up visit.  CODE STATUS: DNR  PPS:  40%  HOSPICE ELIGIBILITY/DIAGNOSIS: TBD  PAST MEDICAL HISTORY:  Past Medical History:  Diagnosis Date  . Breast cancer ([place]) 08/19/2012   LT LUMPECTOMY, radiation tx.   Marland Kitchen COPD (chronic obstructive pulmonary disease) ([place])   . Hailey-hailey disease   .  Hyperlipemia   . Hypertension   . Lung cancer ([facility]) 2014   RT LUNG, radiation tx  . Migraine   . Myocardial infarction ([place] [facility])   . Osteoporosis   . Personal history of radiation therapy 2013   left breast ca  . Personal history of radiation therapy 2014   lung ca  . Radiation 2013   FOR BREAST  CA  . Radiation 2014   FOR LUNG CA  . Renal artery stenosis ([place])   . Renal insufficiency     SOCIAL HX:  Social History   Tobacco Use  . Smoking status: Current Every Day Smoker    Packs/day: 1.00    Years: 70.00    Pack years: 70.00  . Smokeless tobacco: Never Used  Substance Use Topics  . Alcohol use: No   FAMILY HX:  Family History  Problem Relation Age of Onset  . Breast cancer Sister 56    ALLERGIES:  Allergies  Allergen Reactions  . Bactrim [Sulfamethoxazole-Trimethoprim] Other (See Comments)    Reduced kidney function.  Per Dr. Holley Raring should not take  . Ibuprofen     Reduced kidney function. Per Dr. Holley Raring should not take  . Macrobid [employer] Macro] Other (See Comments)    Reduced Kidney function.  Should no longer take per Dr. Holley Raring  . Tape Rash     PERTINENT MEDICATIONS:  Outpatient Encounter Medications as of 06/11/2020  Medication Sig  . albuterol (PROAIR HFA) 108 (90 Base) MCG/ACT inhaler Inhale 2 puffs into the lungs every 6 (six) hours as needed for wheezing or shortness of breath.   . calcipotriene-betamethasone (TACLONEX) ointment Apply 1 application topically daily. To legs and back as needed  . clotrimazole (MYCELEX) 10 MG troche Take 10 mg by mouth 5 (five) times daily.  Marland Kitchen dexamethasone (DECADRON) 4 MG tablet Take 1 tablet (4 mg total) by mouth daily.  Marland Kitchen diltiazem (CARDIZEM CD) 120 MG 24 hr capsule Take 1 capsule (120 mg total) by mouth daily.  Marland Kitchen ELIQUIS 2.5 MG TABS tablet Take 2.5 mg by mouth 2 (two) times daily.  . feeding supplement (BOOST HIGH PROTEIN) LIQD Take 1 Container by mouth 3 (three) times daily between meals.  . furosemide (LASIX) 20 MG tablet Take 0.5 tablets (10 mg total) by mouth daily.  Marland Kitchen lisinopril (ZESTRIL) 2.5 MG tablet Take 2.5 mg by mouth at bedtime.  . metoprolol succinate (TOPROL-XL) 25 MG 24 hr tablet Take 25 mg by mouth 2 (two) times daily.   . mirtazapine (REMERON) 7.5 MG tablet Take 7.5 mg by mouth at  bedtime.  . Multiple Vitamin (MULTIVITAMIN WITH MINERALS) TABS tablet Take 1 tablet by mouth daily.  . Multiple Vitamins-Minerals (PRESERVISION AREDS PO) Take 1 tablet by mouth 2 (two) times daily.  . ondansetron (ZOFRAN) 8 MG tablet Take 1 tablet (8 mg total) by mouth 2 (two) times daily as needed for refractory nausea / vomiting.  . simvastatin (ZOCOR) 20 MG tablet Take 20 mg by mouth at bedtime.  . traMADol (ULTRAM) 50 MG tablet Take 1 tablet (50 mg total) by mouth every 12 (twelve) hours as needed.   Facility-Administered Encounter Medications as of 06/11/2020  Medication  . 0.9 %  sodium chloride infusion    PHYSICAL EXAM / ROS:   Current and past weights: recent weight  unavailable General: NAD, frail appearing, thin Cardiovascular: no chest pain reported, no  LE edema  Pulmonary: no cough, no increased SOB at rest, endorses DOE, room air Abdomen: appetite good ,  denies constipation, continent of bowel GU: denies dysuria, continent of urine MSK:  + joint and ROM abnormalities, ambulatory with walker and stand by. Has w/c., Recent falls Skin: skin tears Left forearm due to falls. Neurological: Weakness, endorses chronic arthritic pain, reports insomnia each night at 2 am.  Jason Coop, NP , DNP, MPH, [facility]  COVID-19 PATIENT SCREENING TOOL  Person answering questions: ____________Self______ _____   1.  Is the patient or any family member in the home showing any signs or symptoms regarding respiratory infection?               Person with Symptom- __________NA_________________  a. Fever                                                                          Yes___ No___          ___________________  b. Shortness of breath                                                    Yes___ No___          ___________________ c. Cough/congestion                                       Yes___  No___         ___________________ d. Body aches/pains                                                          Yes___ No___        ____________________ e. Gastrointestinal symptoms (diarrhea, nausea)           Yes___ No___        ____________________  2. Within the past 14 days, has anyone living in the home had any contact with someone with or under investigation for COVID-19?    Yes___ No_X_   Person __________________

## 2020-06-14 ENCOUNTER — Encounter: Payer: Self-pay | Admitting: Hospice and Palliative Medicine

## 2020-06-14 ENCOUNTER — Inpatient Hospital Stay: Payer: Medicare Other | Attending: Oncology

## 2020-06-14 ENCOUNTER — Other Ambulatory Visit: Payer: Self-pay

## 2020-06-14 ENCOUNTER — Inpatient Hospital Stay (HOSPITAL_BASED_OUTPATIENT_CLINIC_OR_DEPARTMENT_OTHER): Payer: Medicare Other | Admitting: Hospice and Palliative Medicine

## 2020-06-14 VITALS — BP 100/74 | HR 83 | Temp 97.1°F | Wt 108.5 lb

## 2020-06-14 DIAGNOSIS — Z66 Do not resuscitate: Secondary | ICD-10-CM | POA: Diagnosis not present

## 2020-06-14 DIAGNOSIS — F17210 Nicotine dependence, cigarettes, uncomplicated: Secondary | ICD-10-CM | POA: Insufficient documentation

## 2020-06-14 DIAGNOSIS — C3411 Malignant neoplasm of upper lobe, right bronchus or lung: Secondary | ICD-10-CM | POA: Diagnosis not present

## 2020-06-14 DIAGNOSIS — C3432 Malignant neoplasm of lower lobe, left bronchus or lung: Secondary | ICD-10-CM | POA: Diagnosis present

## 2020-06-14 DIAGNOSIS — Z853 Personal history of malignant neoplasm of breast: Secondary | ICD-10-CM | POA: Insufficient documentation

## 2020-06-14 DIAGNOSIS — R918 Other nonspecific abnormal finding of lung field: Secondary | ICD-10-CM

## 2020-06-14 DIAGNOSIS — Z515 Encounter for palliative care: Secondary | ICD-10-CM

## 2020-06-14 LAB — CBC WITH DIFFERENTIAL/PLATELET
Abs Immature Granulocytes: 0.5 10*3/uL — ABNORMAL HIGH (ref 0.00–0.07)
Basophils Absolute: 0 10*3/uL (ref 0.0–0.1)
Basophils Relative: 0 %
Eosinophils Absolute: 0 10*3/uL (ref 0.0–0.5)
Eosinophils Relative: 0 %
HCT: 34.3 % — ABNORMAL LOW (ref 36.0–46.0)
Hemoglobin: 11 g/dL — ABNORMAL LOW (ref 12.0–15.0)
Immature Granulocytes: 4 %
Lymphocytes Relative: 8 %
Lymphs Abs: 0.9 10*3/uL (ref 0.7–4.0)
MCH: 33.1 pg (ref 26.0–34.0)
MCHC: 32.1 g/dL (ref 30.0–36.0)
MCV: 103.3 fL — ABNORMAL HIGH (ref 80.0–100.0)
Monocytes Absolute: 0.3 10*3/uL (ref 0.1–1.0)
Monocytes Relative: 3 %
Neutro Abs: 10 10*3/uL — ABNORMAL HIGH (ref 1.7–7.7)
Neutrophils Relative %: 85 %
Platelets: 130 10*3/uL — ABNORMAL LOW (ref 150–400)
RBC: 3.32 MIL/uL — ABNORMAL LOW (ref 3.87–5.11)
RDW: 18 % — ABNORMAL HIGH (ref 11.5–15.5)
WBC: 11.8 10*3/uL — ABNORMAL HIGH (ref 4.0–10.5)
nRBC: 0.3 % — ABNORMAL HIGH (ref 0.0–0.2)

## 2020-06-14 LAB — COMPREHENSIVE METABOLIC PANEL
ALT: 202 U/L — ABNORMAL HIGH (ref 0–44)
AST: 52 U/L — ABNORMAL HIGH (ref 15–41)
Albumin: 3.4 g/dL — ABNORMAL LOW (ref 3.5–5.0)
Alkaline Phosphatase: 126 U/L (ref 38–126)
Anion gap: 11 (ref 5–15)
BUN: 64 mg/dL — ABNORMAL HIGH (ref 8–23)
CO2: 29 mmol/L (ref 22–32)
Calcium: 9.1 mg/dL (ref 8.9–10.3)
Chloride: 101 mmol/L (ref 98–111)
Creatinine, Ser: 1.19 mg/dL — ABNORMAL HIGH (ref 0.44–1.00)
GFR calc Af Amer: 47 mL/min — ABNORMAL LOW (ref >60)
GFR calc non Af Amer: 40 mL/min — ABNORMAL LOW (ref >60)
Glucose, Bld: 115 mg/dL — ABNORMAL HIGH (ref 70–99)
Potassium: 4.3 mmol/L (ref 3.5–5.1)
Sodium: 141 mmol/L (ref 135–145)
Total Bilirubin: 0.8 mg/dL (ref 0.3–1.2)
Total Protein: 5.9 g/dL — ABNORMAL LOW (ref 6.5–8.1)

## 2020-06-14 NOTE — Progress Notes (Signed)
[facility]  [phone] [phone]   Name: Janita Camberos [facility] Date: 06/14/2020 MRN: [id number]  DOB: Feb 08, 1928  Patient Care Team: Ezequiel Kayser, MD as PCP - General (Internal Medicine) Lloyd Huger, MD as Medical Oncologist (Medical Oncology) Jason Coop, NP as Nurse Practitioner Kaydyn Chism, Kirt Boys, NP as Nurse Practitioner (Hospice and Palliative Medicine)    REASON FOR CONSULTATION: [name] [name] is a 86 y.o. 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 Patient has declined biopsy to confirm recurrent lung cancer. Stage was at least IIa. She is s/p XRT and carbo/Taxol. PET scan on 05/10/20 revealed interval disease improvement.  Patient was hospitalized 06/06/2020 -06/08/2020 with weakness likely secondary to recent UTIs, dehydration, and rapid A. fib.  Patient was referred to palliative care to help address goals and manage ongoing symptoms.  SOCIAL HISTORY:     reports that she has been smoking. She has a 70.00 pack-year smoking history. She has never used smokeless tobacco. She reports that she does not drink alcohol and does not use drugs.   Patient is widowed.  She lives at home with a daughter.  In total, she has 2 daughters and 2 sons.  Patient previously worked in [employer].  ADVANCE DIRECTIVES:  Not on file  CODE STATUS: DNR in [place]: Past Medical History:  Diagnosis Date   Breast cancer ([place] [facility]) 08/19/2012   LT LUMPECTOMY, radiation tx.    COPD (chronic obstructive pulmonary disease) ([facility])    Hailey-hailey disease    Hyperlipemia    Hypertension    Lung cancer ([place]) 2014   RT LUNG, radiation tx   Migraine    Myocardial  infarction [facility])    Osteoporosis    Personal history of radiation therapy 2013   left breast ca   Personal history of radiation therapy 2014   lung ca   Radiation 2013   FOR BREAST CA   Radiation 2014   FOR LUNG CA   Renal artery stenosis ([facility])    Renal insufficiency     PAST SURGICAL HISTORY:  Past Surgical History:  Procedure Laterality Date   ABDOMINAL HYSTERECTOMY     APPENDECTOMY     BREAST BIOPSY Left 08/03/2012   invasive mammary carcinoma, u/s guided bx   BREAST LUMPECTOMY Left 2013   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   cardiac stents     PARATHYROIDECTOMY     TONSILLECTOMY      HEMATOLOGY/ONCOLOGY HISTORY:  Oncology History   No history exists.    ALLERGIES:  is allergic to bactrim [sulfamethoxazole-trimethoprim], ibuprofen, macrobid [nitrofurantoin monohyd macro], and tape.  MEDICATIONS:  Current Outpatient Medications  Medication Sig Dispense Refill   albuterol (PROAIR HFA) 108 (90 Base) MCG/ACT inhaler Inhale 2 puffs into the lungs every 6 (six) hours as needed for wheezing or shortness of breath.      clotrimazole (MYCELEX) 10 MG troche Take 10 mg by mouth 5 (five) times daily.     dexamethasone (DECADRON) 4 MG tablet Take 1 tablet (4 mg total) by mouth daily. 30 tablet 0   diltiazem (CARDIZEM CD) 120 MG 24 hr capsule Take 1 capsule (120 mg total) by mouth daily. 30 capsule 0   ELIQUIS 2.5 MG  TABS tablet Take 2.5 mg by mouth 2 (two) times daily.     feeding supplement (BOOST HIGH PROTEIN) LIQD Take 1 Container by mouth 3 (three) times daily between meals.     furosemide (LASIX) 20 MG tablet Take 0.5 tablets (10 mg total) by mouth daily. 30 tablet 0   lisinopril (ZESTRIL) 2.5 MG tablet Take 2.5 mg by mouth at bedtime.     metoprolol succinate (TOPROL-XL) 25 MG 24 hr tablet Take 25 mg by mouth 2 (two) times daily.      mirtazapine (REMERON) 7.5 MG tablet Take 7.5 mg by mouth at bedtime.     Multiple  Vitamin (MULTIVITAMIN WITH MINERALS) TABS tablet Take 1 tablet by mouth daily.     Multiple Vitamins-Minerals (PRESERVISION AREDS PO) Take 1 tablet by mouth 2 (two) times daily.     simvastatin (ZOCOR) 20 MG tablet Take 20 mg by mouth at bedtime.     traMADol (ULTRAM) 50 MG tablet Take 1 tablet (50 mg total) by mouth every 12 (twelve) hours as needed. 12 tablet 0   calcipotriene-betamethasone (TACLONEX) ointment Apply 1 application topically daily. To legs and back as needed (Patient not taking: Reported on 06/14/2020)     ondansetron (ZOFRAN) 8 MG tablet Take 1 tablet (8 mg total) by mouth 2 (two) times daily as needed for refractory nausea / vomiting. (Patient not taking: Reported on 06/11/2020) 60 tablet 1   No current facility-administered medications for this visit.   Facility-Administered Medications Ordered in Other Visits  Medication Dose Route Frequency Provider Last Rate Last Admin   0.9 %  sodium chloride infusion   Intravenous Continuous Lloyd Huger, MD 100 mL/hr at 02/17/20 1115 New Bag at 02/17/20 1115    VITAL SIGNS: There were no vitals taken for this visit. There were no vitals filed for this visit.  Estimated body mass index is 20.07 kg/m as calculated from the following:   Height as of 06/06/20: 5\' 3"  (1.6 m).   Weight as of 06/06/20: 113 lb 5.1 oz (51.4 kg).  LABS: CBC:    Component Value Date/Time   WBC 11.8 (H) 06/14/2020 1334   HGB 11.0 (L) 06/14/2020 1334   HGB 12.9 03/05/2015 2050   HCT 34.3 (L) 06/14/2020 1334   HCT 39.8 03/05/2015 2050   PLT 130 (L) 06/14/2020 1334   PLT 222 03/05/2015 2050   MCV 103.3 (H) 06/14/2020 1334   MCV 93 03/05/2015 2050   NEUTROABS 10.0 (H) 06/14/2020 1334   NEUTROABS 10.2 (H) 03/05/2015 2050   LYMPHSABS 0.9 06/14/2020 1334   LYMPHSABS 1.8 03/05/2015 2050   MONOABS 0.3 06/14/2020 1334   MONOABS 1.2 (H) 03/05/2015 2050   EOSABS 0.0 06/14/2020 1334   EOSABS 0.2 03/05/2015 2050   BASOSABS 0.0 06/14/2020 1334    BASOSABS 0.1 03/05/2015 2050   Comprehensive Metabolic Panel:    Component Value Date/Time   NA 141 06/14/2020 1334   NA 136 03/05/2015 2050   K 4.3 06/14/2020 1334   K 3.9 03/05/2015 2050   CL 101 06/14/2020 1334   CL 107 03/05/2015 2050   CO2 29 06/14/2020 1334   CO2 22 03/05/2015 2050   BUN 64 (H) 06/14/2020 1334   BUN 38 (H) 03/05/2015 2050   CREATININE 1.19 (H) 06/14/2020 1334   CREATININE 1.47 (H) 03/05/2015 2050   GLUCOSE 115 (H) 06/14/2020 1334   GLUCOSE 116 (H) 03/05/2015 2050   CALCIUM 9.1 06/14/2020 1334   CALCIUM 9.0 04/04/2020 1419   AST 52 (  H) 06/14/2020 1334   ALT 202 (H) 06/14/2020 1334   ALKPHOS 126 06/14/2020 1334   BILITOT 0.8 06/14/2020 1334   PROT 5.9 (L) 06/14/2020 1334   ALBUMIN 3.4 (L) 06/14/2020 1334    RADIOGRAPHIC STUDIES: DG Chest 2 View  Result Date: 06/06/2020 CLINICAL DATA:  Lung cancer.  Atrial fibrillation.  Weakness. EXAM: CHEST - 2 VIEW COMPARISON:  04/06/2020 FINDINGS: Heart size and vascularity normal. Atherosclerotic calcification in the thoracic aorta. COPD with hyperinflation. Right upper lobe linear density unchanged. Patchy airspace disease in the right lung base shows interval improvement. Left lung clear. No new area of infiltrate or effusion. Right coronary artery stent. IMPRESSION: COPD. Improvement in right lower lobe airspace disease. No new area of pneumonia. Electronically Signed   By: Franchot Gallo M.D.   On: 06/06/2020 08:37   CT Head Wo Contrast  Result Date: 06/06/2020 CLINICAL DATA:  Head trauma, minor; fall on Eliquis, evaluate for ICH. EXAM: CT HEAD WITHOUT CONTRAST TECHNIQUE: Contiguous axial images were obtained from the base of the skull through the vertex without intravenous contrast. COMPARISON:  Head CT 03/24/2005 FINDINGS: Brain: Moderate generalized parenchymal atrophy, progressed as compared to the head CT of 03/23/2005. Moderate patchy hypoattenuation within the cerebral white matter, also progressed as compared  to the prior CT. Small left parietal lobe chronic white matter infarct. There is no acute intracranial hemorrhage. No demarcated cortical infarct. No extra-axial fluid collection. No evidence of intracranial mass. No midline shift. Vascular: No hyperdense vessel.  Atherosclerotic calcifications. Skull: Normal. Negative for fracture or focal lesion. Sinuses/Orbits: Visualized orbits show no acute finding. Mild scattered paranasal sinus mucosal thickening at the imaged levels. Small left sphenoid sinus mucous retention cyst. No significant mastoid effusion at the imaged levels. IMPRESSION: No evidence of acute intracranial abnormality. Moderate generalized parenchymal atrophy and chronic small vessel ischemic disease, progressed as compared to the prior head CT of 03/23/2005. Mild paranasal sinus mucosal thickening. Small left sphenoid sinus mucous retention cyst. Electronically Signed   By: Kellie Simmering DO   On: 06/06/2020 08:51   CT ABDOMEN PELVIS W CONTRAST  Result Date: 06/06/2020 CLINICAL DATA:  Abdominal pain, acute with RIGHT upper quadrant and LEFT lower quadrant pain. EXAM: CT ABDOMEN AND PELVIS WITH CONTRAST TECHNIQUE: Multidetector CT imaging of the abdomen and pelvis was performed using the standard protocol following bolus administration of intravenous contrast. CONTRAST:  40mL OMNIPAQUE IOHEXOL 300 MG/ML  SOLN COMPARISON:  November 01, 2019 FINDINGS: Lower chest: Signs of cylindrical bronchiectasis and inspissated material within RIGHT lower lobe bronchi. Overlying the RIGHT hemidiaphragm is an area of nodularity measuring 2.3 x 1.4 cm this has not changed significantly in the interval since December of 2020, perhaps even slightly smaller though position of the RIGHT hemidiaphragm could change appearance. Marked dilation of the distal thoracic aorta with tortuosity and eccentric plaque measuring approximately 4.2 cm greatest axial dimension similar to November of 2019. Due to tortuosity measurements  could be exaggerated. Measurement in long axis is 5.1 cm. This is unchanged and likely exaggerated by the aortic tortuosity at this level in the lower chest. Signs of prior percutaneous coronary intervention. No pleural effusion. Pulmonary emphysema. Hepatobiliary: Mildly lobular hepatic contours. No focal, suspicious hepatic lesion. No biliary duct dilation. No pericholecystic stranding. Pancreas: 1.9 x 1.8 cm cystic lesion in the head of the pancreas (image 24, series 2) no main duct dilation or peripancreatic inflammation. Spleen: Spleen normal in size and contour. Adrenals/Urinary Tract: LEFT adrenal with enlargement and calcification measuring approximately  2.0 x 1.8 cm in greatest axial dimension. Given position of adrenal with inspiratory effort this could be altered but it appears that it may be smaller, when measured in a similar location previously it measured approximately 2.5 x 1.8 cm, measuring approximately 1.5 cm greatest thickness on today's study more superiorly near the calcification previously approximately 1.9 cm in a similar location. The RIGHT adrenal is normal. Urinary bladder is distended. No hydronephrosis. Renal cortical scarring. Small cysts in the kidneys. Stomach/Bowel: No acute gastrointestinal process. Colonic diverticulosis. Vascular/Lymphatic: Vascular disease with infrarenal abdominal aortic aneurysm measuring approximately 4.8 x 3.8 cm just below the renal arteries. The aortic tortuosity showing a similar appearance. Tortuosity exaggerating luminal size, appearance similar to the previous exam with large amount of eccentric mural thrombus/soft plaque along the RIGHT lateral wall at the area of greatest dilation. 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 No periaortic stranding. Patent visceral branches despite severe atheromatous changes. No  adenopathy in the upper abdomen or in the retroperitoneum. Small retrocrural lymph nodes are stable. No pelvic adenopathy. Reproductive: Post hysterectomy with signs of pelvic floor dysfunction with bowing of pelvic floor musculature. Other: No ascites.  No free air. Musculoskeletal: No acute bone finding. No destructive bone process. IMPRESSION: No CT signs of acute abnormality in the abdomen or pelvis post infectious changes in the RIGHT lower lobe along with a more focal nodular area at the RIGHT lung base above the RIGHT hemidiaphragm potentially post infectious scarring. Given patient history would suggest close attention on follow-up. Thoracic and abdominal aneurysmal dilation and severe vascular disease with tortuosity as described. Findings not changed since prior imaging. Stable cystic pancreatic lesion, attention on follow-up. Electronically Signed   By: Zetta Bills M.D.   On: 06/06/2020 09:47    PERFORMANCE STATUS (ECOG) : 3 - Symptomatic, >50% confined to bed  Review of Systems Unless otherwise noted, a complete review of systems is negative.  Physical Exam General: NAD, thin, in wheelchair Pulmonary: Unlabored Extremities: no edema, no joint deformities Skin: Sacral ulcer noted but not visualized Neurological: Weakness but otherwise nonfocal  IMPRESSION: Posthospitalization follow-up visit.  Patient is accompanied by her daughter.  Patient reports that she is doing better.  She denies any significant changes or concerns since returning home from the hospital.  She is receiving home health PT/OT/SN.  Patient denies any significant symptomatic complaints today.  No fever, chills, nausea, vomiting, diarrhea, abdominal pain, or urinary symptoms.  Patient had her dose of mirtazapine increased earlier this week and has found that it has significantly helped her sleep.  Daughter says that patient has some anxiety.  We discussed giving it time to see if the mirtazapine improves her  anxiety and if not could consider initiation of an SSRI.  Patient reports good appetite but is still not drinking much fluids.  Daughter plans to get her water bottles to help measure intake.  Note slightly increased transaminases today. Could be secondary to mirtazapine as this occurs in 2% of patients. However, will have patient increase fluids and will recheck labs next week.    PLAN: -Continue current scope of treatment -Continue home health PT/OT/SN -Home health SN to check CBC/CMP next week -Follow-up virtual visit in 1 month  Case and plan discussed with Dr. Grayland Ormond   Patient expressed understanding and was in agreement with this plan. She also understands that She  can call the clinic at any time with any questions, concerns, or complaints.     Time Total: 15 minutes  Visit consisted of counseling and education dealing with the complex and emotionally intense issues of symptom management and palliative care in the setting of serious and potentially life-threatening illness.Greater than 50%  of this time was spent counseling and coordinating care related to the above assessment and plan.  Signed by: Altha Harm, PhD, NP-C

## 2020-06-14 NOTE — Progress Notes (Signed)
Patient denies any concerns today.  

## 2020-06-15 ENCOUNTER — Other Ambulatory Visit: Payer: Self-pay | Admitting: *Deleted

## 2020-06-15 ENCOUNTER — Telehealth: Payer: Self-pay | Admitting: *Deleted

## 2020-06-15 DIAGNOSIS — C3432 Malignant neoplasm of lower lobe, left bronchus or lung: Secondary | ICD-10-CM

## 2020-06-15 MED ORDER — DEXAMETHASONE 4 MG PO TABS: 4.0000 mg | ORAL_TABLET | Freq: Every day | ORAL | 0 refills | Status: AC

## 2020-06-15 NOTE — Telephone Encounter (Signed)
I attempted to call [name] [name] and [name] [name] both, but voice mail on their phone I attempted to contact patient and got no answer there either, so I called her daughter and spoke with her and adivsed her of NP response to have patient go to ER and went over v/s given by [facility]. Daughter voiced that she is very familiar with patient history of and that she is under the care of Cardiologist and that 108 and irreg is patient normal and that when P came, patient had just come in form walking outside and that her HR is back down to 109 and that she does not feel that patient is in need of going to ER at this time. She stated that she will keep a check on her and that if HR goes up and stays up that she will take her to the ER

## 2020-06-15 NOTE — Telephone Encounter (Signed)
I received back to back calls from [place], occupational therapy and PT regarding patient v/s. OT went out this morning and reports that her b/p was low at 90/60and an irregular HR of 108, then PT called reporting that patient now has a HR of 120-130, but did not report any irregularity. occupational therapy also asking for orders to continue services 1 wk 3. Please advise

## 2020-06-15 NOTE — Telephone Encounter (Signed)
Pt has hx of a fib with rvr. Suspect occurring again. Recommend ER.

## 2020-06-18 ENCOUNTER — Telehealth: Payer: Self-pay | Admitting: *Deleted

## 2020-06-18 NOTE — Telephone Encounter (Signed)
Daughter Vania Rea called asking for return call regarding needing information on the [place] referral. Please return her call [phone]

## 2020-06-18 NOTE — Telephone Encounter (Signed)
Called and spoke with [name] [name], she just needs contact information to reach out to [facility], I have forwarded this information to her.

## 2020-06-22 ENCOUNTER — Telehealth: Payer: Self-pay | Admitting: *Deleted

## 2020-06-22 NOTE — Telephone Encounter (Signed)
[name] [name] with [facility] called requesting for extension of services for PT for 2 week 3. Please advise

## 2020-06-22 NOTE — Telephone Encounter (Signed)
That's fine

## 2020-06-22 NOTE — Telephone Encounter (Signed)
Call returned to [facility] and [facility] given for contiinuation of therapy

## 2020-06-25 ENCOUNTER — Telehealth: Payer: Self-pay | Admitting: *Deleted

## 2020-06-25 NOTE — Telephone Encounter (Signed)
Per Vania Rea, patient PCP is going to order supplies for them

## 2020-06-25 NOTE — Telephone Encounter (Signed)
For what?  Pressure ulcers?  Yes we can fax an order if we know what they need.

## 2020-06-25 NOTE — Telephone Encounter (Signed)
Daughter called stating that they were told that [facility] would provide the supplies for dressing change, but Home Health said they do not supply the supplies and she needs someone to fax an order for needed supplies for dressing changes

## 2020-06-26 ENCOUNTER — Other Ambulatory Visit: Payer: Self-pay

## 2020-06-26 ENCOUNTER — Other Ambulatory Visit: Payer: Medicare Other | Admitting: Primary Care

## 2020-06-26 DIAGNOSIS — C3432 Malignant neoplasm of lower lobe, left bronchus or lung: Secondary | ICD-10-CM

## 2020-06-26 DIAGNOSIS — Z515 Encounter for palliative care: Secondary | ICD-10-CM

## 2020-06-26 NOTE — Progress Notes (Signed)
[facility] Consult Note Telephone: [phone]  Fax: [phone]  PATIENT NAME: [name] [name] [address] [place] [address] [phone] (home)  DOB: [birth year] MRN: [id number]  PRIMARY CARE PROVIDER:    Ezequiel Kayser, MD,  [place] [address] [phone]  REFERRING PROVIDER:   Ezequiel Kayser, MD [place] [facility] [place],  [place] [address] [phone]  RESPONSIBLE PARTY:   Extended Emergency Contact Information Primary Emergency Contact: Jagodzinski,Helen Address: [address]          [place], [place] [address] [place] Phone: [phone] Mobile Phone: [phone] Relation: Daughter  I met face to face with patient and family in home. ASSESSMENT AND RECOMMENDATIONS:   1. Advance Care Planning/Goals of Care: Goals include to maximize quality of life and symptom management. Our advance care planning conversation included a discussion about:      Exploration of goals of care in the event of a sudden injury or illness - States daughter is poa and will handle.   Identification and preparation of a healthcare agent - daughter  Review  of an  advance directive document ; no changes.   Advance directives include DNR, limited interventions, use of abx, limited use of iv, no feeding tube.  2. Symptom Management:   Dyspnea: Reports increased during sleeping and gets up and does a H[facility] Rx. Reports increased  Increased DOE and has some pedal edema. Will test for hs oxygenation on her own and report. Rolls to [employer] for smoking daily. Discussed changing from [facility] to nebulizer as she may have a better dose delivery with nebulizer vs inhalers.     Mobility: Walking and using w/c. Fall risk.  We discussed her having someone to go with her ambulating for stand by. States she is not perceiving much improvement with physical therapy. Encouraged to  do HEP on days she is not having tx. But appears to be weakening.  Skin integrity: Stage 2 on sacrum. Has breakdown on toes as well. Encouraged to increase protein intake. Discussed home health role in supplying dressing materials as long as she's open to home health services. Using silicone bordered dressings on sacrum and elbows. Suggested to use dressing and not moleskin on toe ulcers.  Pain: Starts CR arthritis daily now with better relief. Denies pain at this time.  Insomnia: Taking Mirtazipine 15 mg with good results. Slept well last pm and usually does now. Limiting daily naps.   Nutrition/Dehydration: States she feels hydrated, and takes 32 oz.Encouraged to take more fluid daily. Reports wt of 106, bmi 18.8. I asked her to weigh to report before our next meeting. Albumin on 7/29 was 3.4  Smoking cessation: States she is still considering.Family states she has smoked many years. She may need a nicotine patch if she can no longer smoke as she weakens.  3. Follow up Palliative Care Visit: Palliative care will continue to follow for goals of care clarification and symptom management. Return 4-6 weeks or prn.  4. Family /Caregiver/Community Supports: Lives with family in home. Supported currently, has home health.  5. Cognitive / Functional decline: A and O x 3, some HOH, weaker per report.Needs assistance with most adls, iadls.  I spent 60 minutes providing this consultation,  from [place] to 0945. More than 50% of the time in this consultation was spent coordinating communication.   CHIEF COMPLAINT: Increasing skin breakdown, weakness  HISTORY OF PRESENT ILLNESS:  [name] [name] is a 86 y.o. year old female with multiple medical problems including lung cancer LLL, with Rad Tx and Chemo in spring, 2021, Mass measuring 3.6 cm in June 2021, was 15.5 cm on dx, skin breakdown, FTT, nutritional decline, weakness, fall risk . Palliative Care was asked to follow this patient by consultation  request of Ezequiel Kayser, MD to help address advance care planning and goals of care. This is a follow up visit.  CODE STATUS: DNR  PPS: 40%  HOSPICE ELIGIBILITY/DIAGNOSIS: tbd/lung cancer, abnormal wt loss PAST MEDICAL HISTORY:  Past Medical History:  Diagnosis Date  . Breast cancer ([facility]) 08/19/2012   LT LUMPECTOMY, radiation tx.   Marland Kitchen COPD (chronic obstructive pulmonary disease) ([facility])   . Hailey-hailey disease   . Hyperlipemia   . Hypertension   . Lung cancer ([place]) 2014   RT LUNG, radiation tx  . Migraine   . Myocardial infarction ([place])   . Osteoporosis   . Personal history of radiation therapy 2013   left breast ca  . Personal history of radiation therapy 2014   lung ca  . Radiation 2013   FOR BREAST CA  . Radiation 2014   FOR LUNG CA  . Renal artery stenosis ([facility])   . Renal insufficiency     SOCIAL HX:  Social History   Tobacco Use  . Smoking status: Current Every Day Smoker    Packs/day: 1.00    Years: 70.00    Pack years: 70.00  . Smokeless tobacco: Never Used  Substance Use Topics  . Alcohol use: No   FAMILY HX:  Family History  Problem Relation Age of Onset  . Breast cancer Sister 72    ALLERGIES:  Allergies  Allergen Reactions  . Bactrim [Sulfamethoxazole-Trimethoprim] Other (See Comments)    Reduced kidney function.  Per Dr. Holley Raring should not take  . Ibuprofen     Reduced kidney function. Per Dr. Holley Raring should not take  . Macrobid [employer] Macro] Other (See Comments)    Reduced Kidney function.  Should no longer take per Dr. Holley Raring  . Tape Rash     PERTINENT MEDICATIONS:  Outpatient Encounter Medications as of 06/26/2020  Medication Sig  . albuterol (PROAIR HFA) 108 (90 Base) MCG/ACT inhaler Inhale 2 puffs into the lungs every 6 (six) hours as needed for wheezing or shortness of breath.   . calcipotriene-betamethasone (TACLONEX) ointment Apply 1 application topically daily. To legs and back as needed (Patient not taking: Reported  on 06/14/2020)  . clotrimazole (MYCELEX) 10 MG troche Take 10 mg by mouth 5 (five) times daily.  Marland Kitchen dexamethasone (DECADRON) 4 MG tablet Take 1 tablet (4 mg total) by mouth daily.  Marland Kitchen diltiazem (CARDIZEM CD) 120 MG 24 hr capsule Take 1 capsule (120 mg total) by mouth daily.  Marland Kitchen ELIQUIS 2.5 MG TABS tablet Take 2.5 mg by mouth 2 (two) times daily.  . feeding supplement (BOOST HIGH PROTEIN) LIQD Take 1 Container by mouth 3 (three) times daily between meals.  . furosemide (LASIX) 20 MG tablet Take 0.5 tablets (10 mg total) by mouth daily.  Marland Kitchen lisinopril (ZESTRIL) 2.5 MG tablet Take 2.5 mg by mouth at bedtime.  . metoprolol succinate (TOPROL-XL) 25 MG 24 hr tablet Take 25 mg by mouth 2 (two) times daily.   . mirtazapine (REMERON) 7.5 MG tablet Take 7.5 mg by mouth at bedtime.  . Multiple Vitamin (MULTIVITAMIN WITH MINERALS) TABS tablet Take 1 tablet by mouth daily.  . Multiple Vitamins-Minerals (PRESERVISION  AREDS PO) Take 1 tablet by mouth 2 (two) times daily.  . ondansetron (ZOFRAN) 8 MG tablet Take 1 tablet (8 mg total) by mouth 2 (two) times daily as needed for refractory nausea / vomiting. (Patient not taking: Reported on 06/11/2020)  . simvastatin (ZOCOR) 20 MG tablet Take 20 mg by mouth at bedtime.  . traMADol (ULTRAM) 50 MG tablet Take 1 tablet (50 mg total) by mouth every 12 (twelve) hours as needed.   Facility-Administered Encounter Medications as of 06/26/2020  Medication  . 0.9 %  sodium chloride infusion    PHYSICAL EXAM / ROS:   Current and past weights: 106 lbs reported, Ht 63", BMI 18.8 General: NAD, frail appearing, thin Cardiovascular: no chest pain reported,  Pedal 1+ bil edema  Pulmonary: ++ cough,daily smoker, ++ Hs increased SOB, room air, poor air exchange in bil bases Abdomen: appetite fair, fluid intake fair, taking ensure, denies constipation, continent of bowel GU: denies dysuria, continent of urine MSK:  arthritic joint and ROM abnormalities, ambulatory with walker Skin:  sacral stage 2, several areas of breakdown between toes. Neurological: Weakness, denies pain, sleeping well  Jason Coop, NP , DNP, MPH, [facility]  COVID-19 PATIENT SCREENING TOOL  Person answering questions: __________self_____ _____   1.  Is the patient or any family member in the home showing any signs or symptoms regarding respiratory infection?               Person with Symptom- __________NA_________________  a. Fever                                                                          Yes___ No___          ___________________  b. Shortness of breath                                                    Yes___ No___          ___________________ c. Cough/congestion                                       Yes___  No___         ___________________ d. Body aches/pains                                                         Yes___ No___        ____________________ e. Gastrointestinal symptoms (diarrhea, nausea)           Yes___ No___        ____________________  2. Within the past 14 days, has anyone living in the home had any contact with someone with or under investigation for COVID-19?    Yes___ No_X_   Person __________________

## 2020-07-02 ENCOUNTER — Telehealth: Payer: Self-pay | Admitting: *Deleted

## 2020-07-02 ENCOUNTER — Encounter: Payer: Self-pay | Admitting: *Deleted

## 2020-07-02 NOTE — Telephone Encounter (Signed)
Daughter called reporting that she is having difficulty finding a place to get wound care supplies for patient that will take insurance. She is asking for assistance with this.

## 2020-07-02 NOTE — Telephone Encounter (Signed)
Call returned to patients daughter, she states she has spoke with [facility] this afternoon and they have set up supplies to be taken care of by [facility]. She will call back if any further assistance is needed.

## 2020-07-07 IMAGING — US US THORACENTESIS ASP PLEURAL SPACE W/IMG GUIDE
1 series · 3 of 3 positions shown · non-contrast
Comparison: none

INDICATION: Patient with history of atrial fibrillation, lung cancer currently
receiving chemo radiation noted to have bilateral pleural effusions
right greater than left. Request to IR for diagnostic and
therapeutic thoracentesis.

[Series 1: us thoracentesis asp pleural space w/img guide · 3 of 3 slices shown]
[im 1/3]
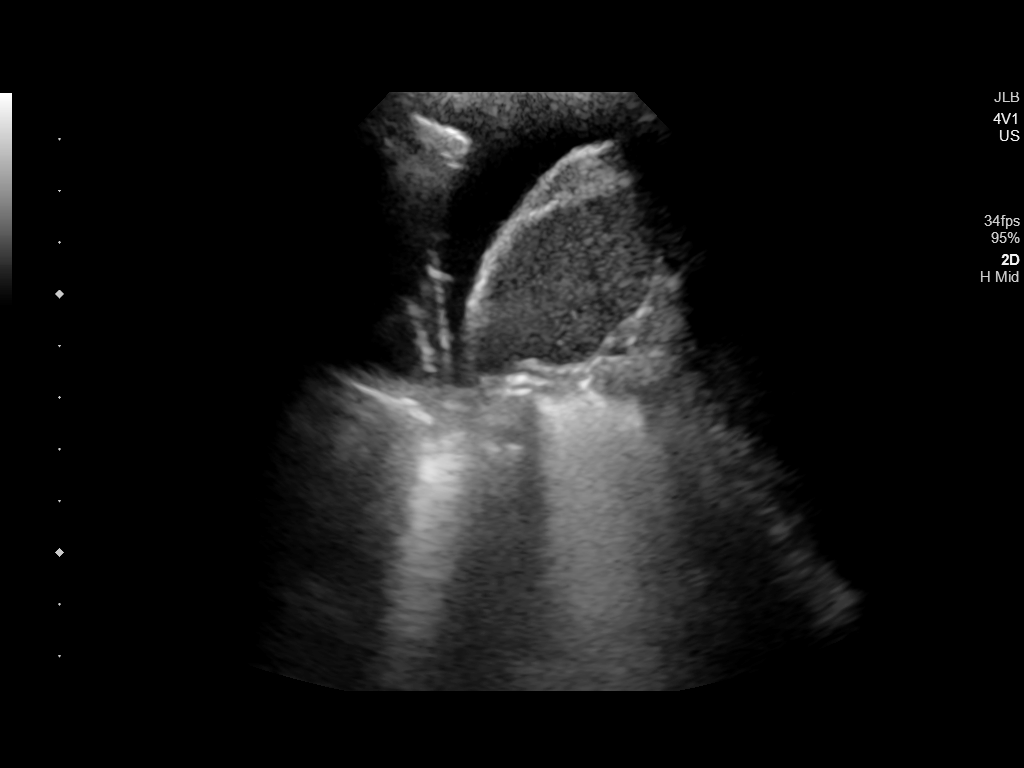
[im 2/3]
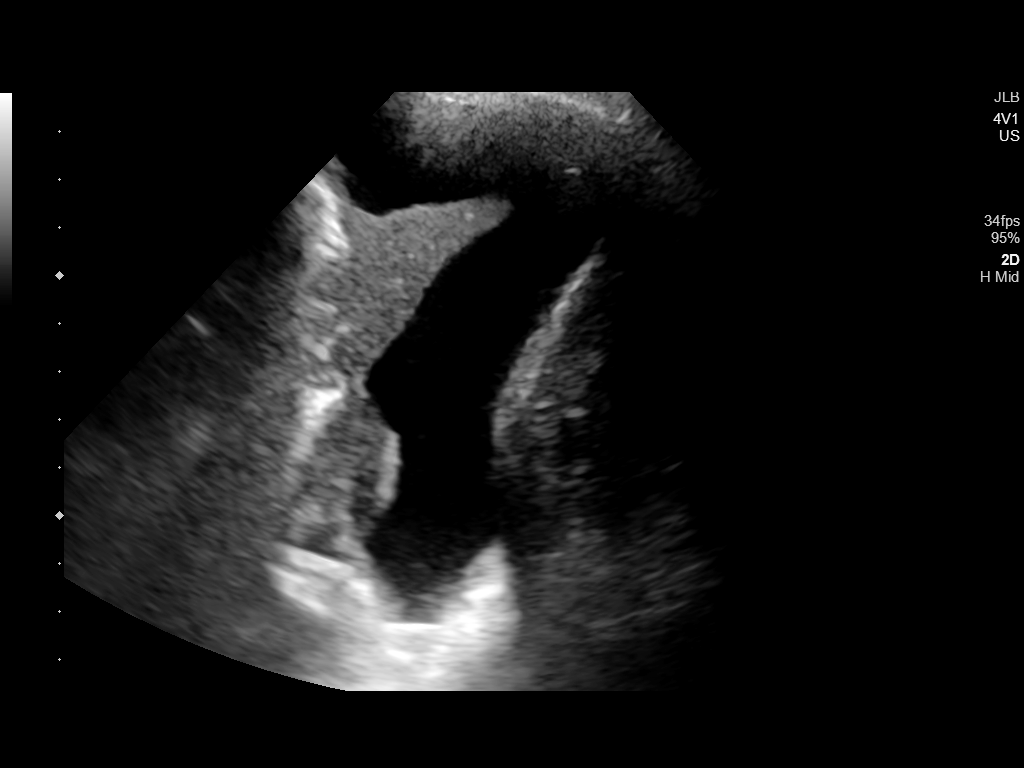
[im 3/3]
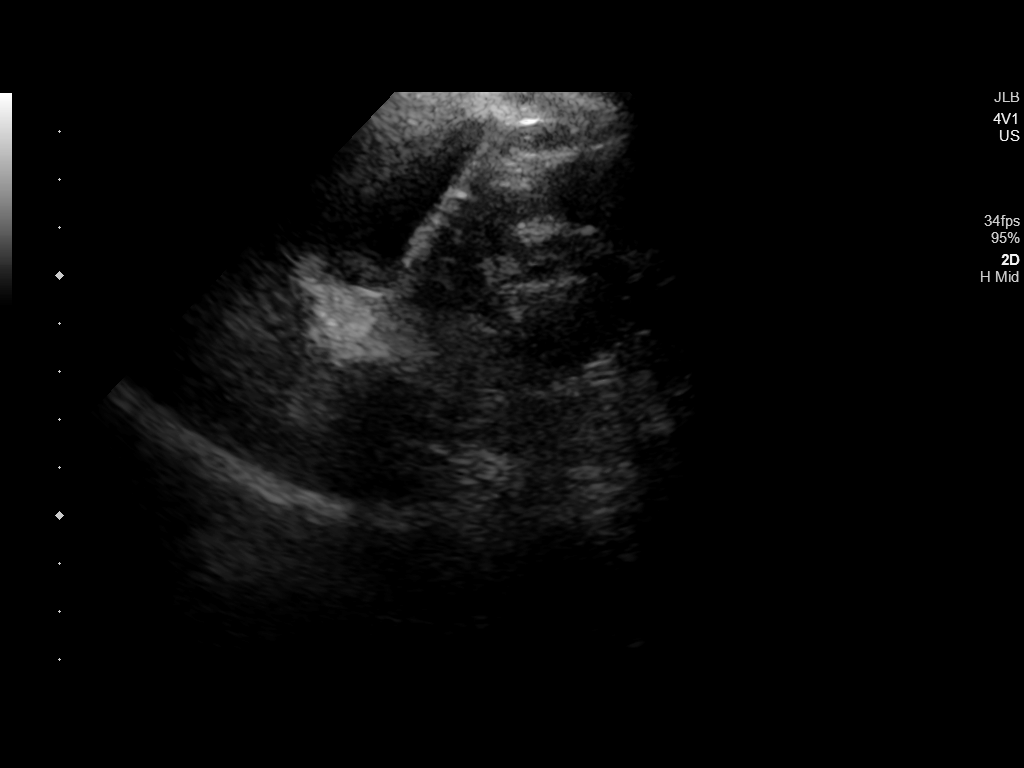

[3 of 3 positions shown; findings below may reference images not displayed]

EXAM:
ULTRASOUND GUIDED RIGHT THORACENTESIS

MEDICATIONS:
10 mL 1% lidocaine

COMPLICATIONS:
None immediate.

PROCEDURE:
An ultrasound guided thoracentesis was thoroughly discussed with the
patient and questions answered. The benefits, risks, alternatives
and complications were also discussed. The patient understands and
wishes to proceed with the procedure. Written consent was obtained.

Ultrasound was performed to localize and mark an adequate pocket of
fluid in the right chest. The area was then prepped and draped in
the normal sterile fashion. 1% Lidocaine was used for local
anesthesia. Under ultrasound guidance a 6 Fr Safe-T-Centesis
catheter was introduced. Thoracentesis was performed. The catheter
was removed and a dressing applied.
FINDINGS: A total of approximately 300 mL of clear yellow fluid was removed.
Samples were sent to the laboratory as requested by the clinical
team.
IMPRESSION: Successful ultrasound guided right thoracentesis yielding 300 mL of
pleural fluid.

Read by Stare, Nadae

## 2020-07-09 ENCOUNTER — Encounter: Payer: Self-pay | Admitting: Hospice and Palliative Medicine

## 2020-07-09 ENCOUNTER — Telehealth: Payer: Self-pay | Admitting: Primary Care

## 2020-07-09 NOTE — Telephone Encounter (Signed)
Requested oncology to put in order for urinalysis/culture and sensitivity which they will do tomorrow. Cipro 250 mg bid x 3 days sent to pharmacy for after collection obtained. Daughter will f/u with c and s and discuss with provider.

## 2020-07-09 NOTE — Telephone Encounter (Signed)
T/c from daughter pt is exhibiting s/sx uti. She wanted to take sample to lab. I put in request to J Borders NP who will put in orders in the am.

## 2020-07-10 ENCOUNTER — Other Ambulatory Visit: Payer: Self-pay | Admitting: Hospice and Palliative Medicine

## 2020-07-10 ENCOUNTER — Other Ambulatory Visit
Admission: RE | Admit: 2020-07-10 | Discharge: 2020-07-10 | Disposition: A | Discharge status: Home / Self Care | Payer: Medicare Other | Source: Ambulatory Visit | Attending: Hospice and Palliative Medicine | Admitting: Hospice and Palliative Medicine

## 2020-07-10 ENCOUNTER — Telehealth: Payer: Self-pay | Admitting: Primary Care

## 2020-07-10 DIAGNOSIS — C3432 Malignant neoplasm of lower lobe, left bronchus or lung: Secondary | ICD-10-CM | POA: Insufficient documentation

## 2020-07-10 LAB — URINALYSIS, COMPLETE (UACMP) WITH MICROSCOPIC
Bilirubin Urine: NEGATIVE
Glucose, UA: NEGATIVE mg/dL
Hgb urine dipstick: NEGATIVE
Ketones, ur: NEGATIVE mg/dL
Leukocytes,Ua: NEGATIVE
Nitrite: POSITIVE — AB
Protein, ur: NEGATIVE mg/dL
Specific Gravity, Urine: 1.012 (ref 1.005–1.030)
pH: 6 (ref 5.0–8.0)

## 2020-07-10 NOTE — Progress Notes (Signed)
UA and culture ordered after discussing symptoms with Ralene Bathe, NP.

## 2020-07-10 NOTE — Telephone Encounter (Signed)
T/c from [facility] asking about hospice services, and how this would serve patient. We discussed hospice philosophy, services and  COPS. She will discuss with patient and call back.

## 2020-07-13 ENCOUNTER — Telehealth: Payer: Self-pay | Admitting: Primary Care

## 2020-07-13 LAB — URINE CULTURE: Culture: 30000 — AB

## 2020-07-13 NOTE — Telephone Encounter (Signed)
T/c from daughter regarding urine culture. She has not initiated the ordered cipro thinking someone was going to call her to start it. She also said pharmacist told her of cardiac risk and she wants another medication. Augmentin 800/125 mg sent for bid x 7 days.  Sensitivity reviewed. Also states patient has decided not to pursue hospice services yet.

## 2020-07-17 ENCOUNTER — Telehealth: Payer: Self-pay | Admitting: *Deleted

## 2020-07-17 NOTE — Telephone Encounter (Signed)
That is okay with me 

## 2020-07-17 NOTE — Telephone Encounter (Signed)
[name] [name] with [facility] called requesting for orders to continue PT services 2wk3, 1wk1. Please advise

## 2020-07-17 NOTE — Telephone Encounter (Signed)
Verbal order called to [facility] for continuation of services as requested

## 2020-07-18 ENCOUNTER — Other Ambulatory Visit: Payer: Self-pay | Admitting: Hospice and Palliative Medicine

## 2020-07-18 ENCOUNTER — Telehealth: Payer: Self-pay | Admitting: Primary Care

## 2020-07-18 DIAGNOSIS — N390 Urinary tract infection, site not specified: Secondary | ICD-10-CM

## 2020-07-18 NOTE — Telephone Encounter (Signed)
I spoke with [name] [name], daughter, on the phone regarding current UTI. Patient is currently treating third UTI  in three months and feels some better. We discussed factors that contribute to UTI especially hers being largely E. coli. I asked her to assist with hygiene. Daughter states the patient is reticent to allow help. I suggested a squirt bottle so that she can clean more thoroughly after a BM. We discussed introducing estrogen cream as well and the danger of frequent UTIs. I did explain to her that standard of care was to treat at 100,000 colonies and patient is not usually demonstrating that much growth. Daughter would like a repeat urinalysis for proof of cure. I will defer to her cancer center providers. I did advise daughter that this again is not standard of care.We will continue to discuss prevention. I suggested she begin a supplement called D-Mannose as well.

## 2020-07-19 ENCOUNTER — Inpatient Hospital Stay: Payer: Medicare Other | Attending: Hospice and Palliative Medicine | Admitting: Hospice and Palliative Medicine

## 2020-07-19 DIAGNOSIS — I130 Hypertensive heart and chronic kidney disease with heart failure and stage 1 through stage 4 chronic kidney disease, or unspecified chronic kidney disease: Secondary | ICD-10-CM | POA: Diagnosis not present

## 2020-07-19 DIAGNOSIS — Z515 Encounter for palliative care: Secondary | ICD-10-CM

## 2020-07-19 DIAGNOSIS — C3432 Malignant neoplasm of lower lobe, left bronchus or lung: Secondary | ICD-10-CM | POA: Diagnosis not present

## 2020-07-19 DIAGNOSIS — I5033 Acute on chronic diastolic (congestive) heart failure: Secondary | ICD-10-CM | POA: Diagnosis not present

## 2020-07-19 DIAGNOSIS — R21 Rash and other nonspecific skin eruption: Secondary | ICD-10-CM

## 2020-07-19 MED ORDER — PREDNISONE 20 MG PO TABS: 20.0000 mg | ORAL_TABLET | Freq: Every day | ORAL | Status: DC

## 2020-07-19 NOTE — Progress Notes (Signed)
Virtual Visit via Video Note  I connected with [name] [name] on 07/19/20 at  2:00 PM EDT by a video enabled telemedicine application and verified that I am speaking with the correct person using two identifiers.   I discussed the limitations of evaluation and management by telemedicine and the availability of in person appointments. The patient expressed understanding and agreed to proceed.  History of Present Illness: [name] [name] is a 86 y.o. 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 Patient has declined biopsy to confirm recurrent lung cancer.Stage was at least IIa. She is s/p XRT and carbo/Taxol. PET scan on 05/10/20 revealed interval disease improvement.  Patient was hospitalized 06/06/2020 -06/08/2020 with weakness likely secondary to recent UTIs, dehydration, and rapid A. fib.  Patient was referred to palliative care to help address goals and manage ongoing symptoms.   Observations/Objective: I had a video visit with patient and daughter.  Patient says she generally does not feel good.  She is weak without much energy.  She continues to receive home health with PT but has not had marked improvement.  She is finishing treatment for UTI.  Daughter has requested follow-up UA, which was ordered.  Daughter also plans to help more with hygiene to try to prevent recurrent UTIs.  Discussed the need for urology eval but daughter is trying to limit unnecessary medical appointments.  Daughter also recognizes that this may be patient's new baseline.  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  Daughter says patient has had a rash over the past week to right arm with some bullous  eruptions.  Daughter would like to trial prednisone.  Discussed with Dr. Grayland Ormond and will order 2-week course.  Again, note the patient is completing course of antibiotic.  Assessment and Plan: Lung cancer -on active surveillance.  Consider hospice when patient/family are already.  In the interim, continue home health for weakness and follow-up in the cancer center  Rash to right arm -we will start prednisone 2-week taper. Hold dexamethasone. Follow-up with dermatology if no improvement.  UTI -complete course of Augmentin.  Follow-up UA.  Case and plan discussed with Dr. Grayland Ormond  Follow Up Instructions: RTC in 3 weeks   I discussed the assessment and treatment plan with the patient. The patient was provided an opportunity to ask questions and all were answered. The patient agreed with the plan and demonstrated an understanding of the instructions.   The patient was advised to call back or seek an in-person evaluation if the symptoms worsen or if the condition fails to improve as anticipated.  I provided 15 minutes of non-face-to-face time during this encounter.   Irean Hong, NP

## 2020-07-21 ENCOUNTER — Other Ambulatory Visit: Payer: Self-pay

## 2020-07-21 ENCOUNTER — Encounter: Payer: Self-pay | Admitting: Emergency Medicine

## 2020-07-21 ENCOUNTER — Emergency Department: Payer: Medicare Other

## 2020-07-21 ENCOUNTER — Inpatient Hospital Stay
Admission: EM | Admit: 2020-07-21 | Discharge: 2020-07-24 | DRG: 291 | Disposition: A | Discharge status: Home / Self Care | Payer: Medicare Other | Attending: Family Medicine | Admitting: Family Medicine

## 2020-07-21 DIAGNOSIS — I1310 Hypertensive heart and chronic kidney disease without heart failure, with stage 1 through stage 4 chronic kidney disease, or unspecified chronic kidney disease: Secondary | ICD-10-CM

## 2020-07-21 DIAGNOSIS — Z7952 Long term (current) use of systemic steroids: Secondary | ICD-10-CM

## 2020-07-21 DIAGNOSIS — M7989 Other specified soft tissue disorders: Secondary | ICD-10-CM

## 2020-07-21 DIAGNOSIS — I4510 Unspecified right bundle-branch block: Secondary | ICD-10-CM | POA: Diagnosis present

## 2020-07-21 DIAGNOSIS — N1832 Chronic kidney disease, stage 3b: Secondary | ICD-10-CM | POA: Diagnosis present

## 2020-07-21 DIAGNOSIS — R627 Adult failure to thrive: Secondary | ICD-10-CM | POA: Diagnosis present

## 2020-07-21 DIAGNOSIS — E43 Unspecified severe protein-calorie malnutrition: Secondary | ICD-10-CM | POA: Diagnosis present

## 2020-07-21 DIAGNOSIS — I252 Old myocardial infarction: Secondary | ICD-10-CM

## 2020-07-21 DIAGNOSIS — I10 Essential (primary) hypertension: Secondary | ICD-10-CM | POA: Diagnosis present

## 2020-07-21 DIAGNOSIS — Z882 Allergy status to sulfonamides status: Secondary | ICD-10-CM

## 2020-07-21 DIAGNOSIS — Z91048 Other nonmedicinal substance allergy status: Secondary | ICD-10-CM

## 2020-07-21 DIAGNOSIS — Z902 Acquired absence of lung [part of]: Secondary | ICD-10-CM | POA: Diagnosis not present

## 2020-07-21 DIAGNOSIS — N179 Acute kidney failure, unspecified: Secondary | ICD-10-CM

## 2020-07-21 DIAGNOSIS — Z853 Personal history of malignant neoplasm of breast: Secondary | ICD-10-CM | POA: Diagnosis not present

## 2020-07-21 DIAGNOSIS — Z923 Personal history of irradiation: Secondary | ICD-10-CM

## 2020-07-21 DIAGNOSIS — I482 Chronic atrial fibrillation, unspecified: Secondary | ICD-10-CM

## 2020-07-21 DIAGNOSIS — Z681 Body mass index (BMI) 19 or less, adult: Secondary | ICD-10-CM | POA: Diagnosis not present

## 2020-07-21 DIAGNOSIS — I130 Hypertensive heart and chronic kidney disease with heart failure and stage 1 through stage 4 chronic kidney disease, or unspecified chronic kidney disease: Secondary | ICD-10-CM | POA: Diagnosis present

## 2020-07-21 DIAGNOSIS — I2510 Atherosclerotic heart disease of native coronary artery without angina pectoris: Secondary | ICD-10-CM | POA: Diagnosis present

## 2020-07-21 DIAGNOSIS — Z7901 Long term (current) use of anticoagulants: Secondary | ICD-10-CM | POA: Diagnosis not present

## 2020-07-21 DIAGNOSIS — R531 Weakness: Secondary | ICD-10-CM

## 2020-07-21 DIAGNOSIS — Z20822 Contact with and (suspected) exposure to covid-19: Secondary | ICD-10-CM | POA: Diagnosis present

## 2020-07-21 DIAGNOSIS — Z886 Allergy status to analgesic agent status: Secondary | ICD-10-CM | POA: Diagnosis not present

## 2020-07-21 DIAGNOSIS — I5033 Acute on chronic diastolic (congestive) heart failure: Secondary | ICD-10-CM | POA: Diagnosis present

## 2020-07-21 DIAGNOSIS — G43909 Migraine, unspecified, not intractable, without status migrainosus: Secondary | ICD-10-CM | POA: Diagnosis present

## 2020-07-21 DIAGNOSIS — N183 Chronic kidney disease, stage 3 unspecified: Secondary | ICD-10-CM | POA: Diagnosis present

## 2020-07-21 DIAGNOSIS — I509 Heart failure, unspecified: Secondary | ICD-10-CM

## 2020-07-21 DIAGNOSIS — M810 Age-related osteoporosis without current pathological fracture: Secondary | ICD-10-CM | POA: Diagnosis present

## 2020-07-21 DIAGNOSIS — Z66 Do not resuscitate: Secondary | ICD-10-CM | POA: Diagnosis present

## 2020-07-21 DIAGNOSIS — Z720 Tobacco use: Secondary | ICD-10-CM | POA: Diagnosis present

## 2020-07-21 DIAGNOSIS — I5043 Acute on chronic combined systolic (congestive) and diastolic (congestive) heart failure: Secondary | ICD-10-CM | POA: Diagnosis present

## 2020-07-21 DIAGNOSIS — J449 Chronic obstructive pulmonary disease, unspecified: Secondary | ICD-10-CM | POA: Diagnosis present

## 2020-07-21 DIAGNOSIS — F39 Unspecified mood [affective] disorder: Secondary | ICD-10-CM | POA: Diagnosis present

## 2020-07-21 DIAGNOSIS — D6859 Other primary thrombophilia: Secondary | ICD-10-CM | POA: Diagnosis present

## 2020-07-21 DIAGNOSIS — L0390 Cellulitis, unspecified: Secondary | ICD-10-CM | POA: Diagnosis present

## 2020-07-21 DIAGNOSIS — Z888 Allergy status to other drugs, medicaments and biological substances status: Secondary | ICD-10-CM | POA: Diagnosis not present

## 2020-07-21 DIAGNOSIS — L03115 Cellulitis of right lower limb: Secondary | ICD-10-CM | POA: Diagnosis present

## 2020-07-21 DIAGNOSIS — F17210 Nicotine dependence, cigarettes, uncomplicated: Secondary | ICD-10-CM | POA: Diagnosis present

## 2020-07-21 DIAGNOSIS — M1990 Unspecified osteoarthritis, unspecified site: Secondary | ICD-10-CM | POA: Diagnosis present

## 2020-07-21 DIAGNOSIS — C3432 Malignant neoplasm of lower lobe, left bronchus or lung: Secondary | ICD-10-CM | POA: Diagnosis present

## 2020-07-21 DIAGNOSIS — I5023 Acute on chronic systolic (congestive) heart failure: Secondary | ICD-10-CM

## 2020-07-21 DIAGNOSIS — R9431 Abnormal electrocardiogram [ECG] [EKG]: Secondary | ICD-10-CM

## 2020-07-21 DIAGNOSIS — Z79899 Other long term (current) drug therapy: Secondary | ICD-10-CM

## 2020-07-21 DIAGNOSIS — L89152 Pressure ulcer of sacral region, stage 2: Secondary | ICD-10-CM | POA: Diagnosis present

## 2020-07-21 HISTORY — DX: Atherosclerotic heart disease of native coronary artery without angina pectoris: I25.10

## 2020-07-21 HISTORY — DX: Cardiac arrhythmia, unspecified: I49.9

## 2020-07-21 HISTORY — DX: Unspecified osteoarthritis, unspecified site: M19.90

## 2020-07-21 LAB — CBC WITH DIFFERENTIAL/PLATELET
Abs Immature Granulocytes: 0.57 10*3/uL — ABNORMAL HIGH (ref 0.00–0.07)
Basophils Absolute: 0 10*3/uL (ref 0.0–0.1)
Basophils Relative: 0 %
Eosinophils Absolute: 0 10*3/uL (ref 0.0–0.5)
Eosinophils Relative: 0 %
HCT: 31.7 % — ABNORMAL LOW (ref 36.0–46.0)
Hemoglobin: 10.2 g/dL — ABNORMAL LOW (ref 12.0–15.0)
Immature Granulocytes: 5 %
Lymphocytes Relative: 4 %
Lymphs Abs: 0.4 10*3/uL — ABNORMAL LOW (ref 0.7–4.0)
MCH: 34.1 pg — ABNORMAL HIGH (ref 26.0–34.0)
MCHC: 32.2 g/dL (ref 30.0–36.0)
MCV: 106 fL — ABNORMAL HIGH (ref 80.0–100.0)
Monocytes Absolute: 0.4 10*3/uL (ref 0.1–1.0)
Monocytes Relative: 3 %
Neutro Abs: 9.7 10*3/uL — ABNORMAL HIGH (ref 1.7–7.7)
Neutrophils Relative %: 88 %
Platelets: 138 10*3/uL — ABNORMAL LOW (ref 150–400)
RBC: 2.99 MIL/uL — ABNORMAL LOW (ref 3.87–5.11)
RDW: 18.5 % — ABNORMAL HIGH (ref 11.5–15.5)
WBC: 11 10*3/uL — ABNORMAL HIGH (ref 4.0–10.5)
nRBC: 7 % — ABNORMAL HIGH (ref 0.0–0.2)

## 2020-07-21 LAB — COMPREHENSIVE METABOLIC PANEL
ALT: 42 U/L (ref 0–44)
AST: 26 U/L (ref 15–41)
Albumin: 3.1 g/dL — ABNORMAL LOW (ref 3.5–5.0)
Alkaline Phosphatase: 113 U/L (ref 38–126)
Anion gap: 10 (ref 5–15)
BUN: 81 mg/dL — ABNORMAL HIGH (ref 8–23)
CO2: 22 mmol/L (ref 22–32)
Calcium: 8.7 mg/dL — ABNORMAL LOW (ref 8.9–10.3)
Chloride: 110 mmol/L (ref 98–111)
Creatinine, Ser: 1.38 mg/dL — ABNORMAL HIGH (ref 0.44–1.00)
GFR calc Af Amer: 39 mL/min — ABNORMAL LOW (ref >60)
GFR calc non Af Amer: 34 mL/min — ABNORMAL LOW (ref >60)
Glucose, Bld: 152 mg/dL — ABNORMAL HIGH (ref 70–99)
Potassium: 4.9 mmol/L (ref 3.5–5.1)
Sodium: 142 mmol/L (ref 135–145)
Total Bilirubin: 0.7 mg/dL (ref 0.3–1.2)
Total Protein: 6 g/dL — ABNORMAL LOW (ref 6.5–8.1)

## 2020-07-21 LAB — MAGNESIUM: Magnesium: 2.5 mg/dL — ABNORMAL HIGH (ref 1.7–2.4)

## 2020-07-21 LAB — TROPONIN I (HIGH SENSITIVITY)
Troponin I (High Sensitivity): 39 ng/L — ABNORMAL HIGH (ref <18)
Troponin I (High Sensitivity): 37 ng/L — ABNORMAL HIGH (ref <18)

## 2020-07-21 LAB — BRAIN NATRIURETIC PEPTIDE: B Natriuretic Peptide: 1041.4 pg/mL — ABNORMAL HIGH (ref 0.0–100.0)

## 2020-07-21 LAB — TSH: TSH: 1.298 u[IU]/mL (ref 0.350–4.500)

## 2020-07-21 LAB — SARS CORONAVIRUS 2 BY RT PCR (HOSPITAL ORDER, PERFORMED IN ~~LOC~~ HOSPITAL LAB): SARS Coronavirus 2: NEGATIVE

## 2020-07-21 MED ORDER — DILTIAZEM HCL ER COATED BEADS 120 MG PO CP24
120.0000 mg | ORAL_CAPSULE | Freq: Every day | ORAL | Status: DC
  Administered 2020-07-22 – 2020-07-24 (×3): 120 mg via ORAL
  Filled 2020-07-21 (×3): qty 1

## 2020-07-21 MED ORDER — METOPROLOL SUCCINATE ER 25 MG PO TB24
25.0000 mg | ORAL_TABLET | Freq: Three times a day (TID) | ORAL | Status: DC
  Administered 2020-07-21 – 2020-07-24 (×8): 25 mg via ORAL
  Filled 2020-07-21 (×8): qty 1

## 2020-07-21 MED ORDER — ACETAMINOPHEN 325 MG PO TABS
650.0000 mg | ORAL_TABLET | ORAL | Status: DC | PRN
  Administered 2020-07-22: 650 mg via ORAL
  Filled 2020-07-21: qty 2

## 2020-07-21 MED ORDER — POTASSIUM CHLORIDE CRYS ER 10 MEQ PO TBCR
10.0000 meq | EXTENDED_RELEASE_TABLET | Freq: Two times a day (BID) | ORAL | Status: DC
  Administered 2020-07-22 – 2020-07-24 (×5): 10 meq via ORAL
  Filled 2020-07-21 (×5): qty 1

## 2020-07-21 MED ORDER — SIMVASTATIN 20 MG PO TABS
10.0000 mg | ORAL_TABLET | Freq: Every day | ORAL | Status: DC
  Administered 2020-07-21 – 2020-07-23 (×3): 10 mg via ORAL
  Filled 2020-07-21 (×2): qty 1

## 2020-07-21 MED ORDER — SODIUM CHLORIDE 0.9% FLUSH
3.0000 mL | Freq: Two times a day (BID) | INTRAVENOUS | Status: DC
  Administered 2020-07-22 – 2020-07-24 (×6): 3 mL via INTRAVENOUS

## 2020-07-21 MED ORDER — MIRTAZAPINE 15 MG PO TABS
7.5000 mg | ORAL_TABLET | Freq: Every day | ORAL | Status: DC
  Filled 2020-07-21: qty 1

## 2020-07-21 MED ORDER — FUROSEMIDE 10 MG/ML IJ SOLN
40.0000 mg | Freq: Once | INTRAMUSCULAR | Status: AC
  Administered 2020-07-21: 40 mg via INTRAVENOUS
  Filled 2020-07-21: qty 4

## 2020-07-21 MED ORDER — ALBUTEROL SULFATE (2.5 MG/3ML) 0.083% IN NEBU: 3.0000 mL | INHALATION_SOLUTION | RESPIRATORY_TRACT | Status: DC | PRN

## 2020-07-21 MED ORDER — MIRTAZAPINE 15 MG PO TABS
15.0000 mg | ORAL_TABLET | Freq: Every day | ORAL | Status: DC
  Administered 2020-07-21 – 2020-07-23 (×3): 15 mg via ORAL
  Filled 2020-07-21 (×2): qty 1

## 2020-07-21 MED ORDER — APIXABAN 2.5 MG PO TABS
2.5000 mg | ORAL_TABLET | Freq: Two times a day (BID) | ORAL | Status: DC
  Administered 2020-07-21 – 2020-07-24 (×6): 2.5 mg via ORAL
  Filled 2020-07-21 (×8): qty 1

## 2020-07-21 MED ORDER — AMIODARONE HCL 200 MG PO TABS
200.0000 mg | ORAL_TABLET | Freq: Two times a day (BID) | ORAL | Status: DC
  Administered 2020-07-21 – 2020-07-24 (×6): 200 mg via ORAL
  Filled 2020-07-21 (×8): qty 1

## 2020-07-21 MED ORDER — METOPROLOL SUCCINATE ER 50 MG PO TB24: 25.0000 mg | ORAL_TABLET | Freq: Three times a day (TID) | ORAL | Status: DC

## 2020-07-21 MED ORDER — LISINOPRIL 5 MG PO TABS: 2.5000 mg | ORAL_TABLET | Freq: Every day | ORAL | Status: DC

## 2020-07-21 MED ORDER — DEXAMETHASONE 4 MG PO TABS
4.0000 mg | ORAL_TABLET | Freq: Every day | ORAL | Status: DC
  Administered 2020-07-22 – 2020-07-24 (×3): 4 mg via ORAL
  Filled 2020-07-21 (×3): qty 1

## 2020-07-21 MED ORDER — METOPROLOL SUCCINATE ER 50 MG PO TB24: 25.0000 mg | ORAL_TABLET | Freq: Two times a day (BID) | ORAL | Status: DC

## 2020-07-21 MED ORDER — SODIUM CHLORIDE 0.9 % IV SOLN: 250.0000 mL | INTRAVENOUS | Status: DC | PRN

## 2020-07-21 MED ORDER — SODIUM CHLORIDE 0.9% FLUSH: 3.0000 mL | INTRAVENOUS | Status: DC | PRN

## 2020-07-21 MED ORDER — SIMVASTATIN 10 MG PO TABS
20.0000 mg | ORAL_TABLET | Freq: Every day | ORAL | Status: DC
  Filled 2020-07-21: qty 2

## 2020-07-21 MED ORDER — FUROSEMIDE 10 MG/ML IJ SOLN
40.0000 mg | Freq: Two times a day (BID) | INTRAMUSCULAR | Status: AC
  Administered 2020-07-22 (×2): 40 mg via INTRAVENOUS
  Filled 2020-07-21 (×2): qty 4

## 2020-07-21 MED ORDER — TRAMADOL HCL 50 MG PO TABS: 50.0000 mg | ORAL_TABLET | Freq: Two times a day (BID) | ORAL | Status: DC | PRN

## 2020-07-21 NOTE — ED Notes (Signed)
Pt given water with Dr. Charlsie Quest approval.

## 2020-07-21 NOTE — ED Provider Notes (Signed)
[facility] Emergency Department Provider Note ____________________________________________   First MD Initiated Contact with Patient 07/21/20 1743     (approximate)  I have reviewed the triage vital signs and the nursing notes.  HISTORY  Chief Complaint Weakness   HPI [name] [name] is a 86 y.o. femalewho presents to the ED for evaluation of generalized weakness.   Chart review indicates history of HTN, HLD, A. fib on Eliquis and diltiazem, CAD and systolic CHF.  Follows with [place] [facility]ology, Dr. Nehemiah Massed.  Patient presents to the ED with her daughter with concerns for 1 week of progressively worsening generalized weakness, orthopnea and nonproductive cough.  28 of history is provided by daughter.  Daughter cares for the patient daily and lives with her.  Daughter reports generalized progressive weakness, poor ambulation with decreased exercise tolerance, coughing or shortness of breath while lying flat and a nonproductive cough.  Daughter reports compliance with medications without recent changes or missed doses.  Patient does report orthopnea, indicating that she does feel "winded" while laying flat.  She reports decreased urination without dysuria, hematuria or new incontinence.  Denies fevers, flank pains, chest pain, syncope, abdominal pain or vomiting.    Past Medical History:  Diagnosis Date  . Breast cancer ([place]) 08/19/2012   LT LUMPECTOMY, radiation tx.   Marland Kitchen COPD (chronic obstructive pulmonary disease) ([place])   . Hailey-hailey disease   . Hyperlipemia   . Hypertension   . Lung cancer ([place]) 2014   RT LUNG, radiation tx  . Migraine   . Myocardial infarction ([place])   . Osteoporosis   . Personal history of radiation therapy 2013   left breast ca  . Personal history of radiation therapy 2014   lung ca  . Radiation 2013   FOR BREAST CA  . Radiation 2014   FOR LUNG CA  . Renal artery stenosis ([place])   . Renal insufficiency      Patient Active Problem List   Diagnosis Date Noted  . Pressure injury of skin 06/07/2020  . Generalized weakness 06/06/2020  . Depression 06/06/2020  . S/P thoracentesis   . Malignant neoplasm of lower lobe of left lung ([place])   . Rapid atrial fibrillation ([place]) 02/17/2020  . Acute CHF (congestive heart failure) ([place]) 02/17/2020  . Emphysema lung ([place]) 02/17/2020  . Goals of care, counseling/discussion 01/13/2020  . Mass of lower lobe of left lung 12/22/2019  . 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 Acute kidney failure ([place]) 10/18/2019  . Proteinuria 10/18/2019  . Chronic left-sided thoracic back pain 09/08/2019  . Secondary hyperparathyroidism of renal origin ([place]) 01/26/2019  . Pain in toes of both feet 04/14/2018  . Bradycardia 08/14/2016  . Primary cancer of lower-inner quadrant of left female breast ([place]) 07/17/2016  . Chronic low back pain 06/18/2016  . Combined fat and carbohydrate induced hyperlipemia 01/25/2016  . Myocardial infarction ([place]) 01/25/2016  . Current tobacco use 01/25/2016  . Chronic use of opiate drug for therapeutic purpose 01/17/2016  . Hyperkalemia 12/21/2015  . Dizziness 07/25/2015  . Immunizations incomplete 03/04/2015  . MI (mitral incompetence) 12/20/2014  . Simple chronic bronchitis ([place]) 12/15/2014  . Vasomotor rhinitis 12/15/2014  . Coronary artery disease of native artery of native heart with stable angina pectoris ([place]) 12/12/2014  . TI (tricuspid incompetence) 12/12/2014  . Breathlessness on exertion 12/12/2014  . Stage 3 chronic kidney disease 06/15/2014  . Benign essential hypertension 06/15/2014  . Primary cancer of right upper lobe of lung ([place]) 06/15/2014  .  Osteoporosis, post-menopausal 06/15/2014  . Peripheral vascular disease ([place]) 06/15/2014  . Renal artery stenosis ([place]) 06/15/2014  . History of left breast cancer 11/18/2011  . History of myocardial infarction 05/17/2001    Past Surgical History:  Procedure Laterality Date   . ABDOMINAL HYSTERECTOMY    . APPENDECTOMY    . BREAST BIOPSY Left 08/03/2012   invasive mammary carcinoma, u/s guided bx  . 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 cardiac stents    . PARATHYROIDECTOMY    . TONSILLECTOMY      Prior to Admission medications   Medication Sig Start Date End Date Taking? Authorizing Provider  albuterol (PROAIR HFA) 108 (90 Base) MCG/ACT inhaler Inhale 2 puffs into the lungs every 6 (six) hours as needed for wheezing or shortness of breath.  08/15/15   [provider]  calcipotriene-betamethasone (TACLONEX) ointment Apply 1 application topically daily. To legs and back as needed Patient not taking: Reported on 06/14/2020    [provider]  clotrimazole (MYCELEX) 10 MG troche Take 10 mg by mouth 5 (five) times daily. 06/04/20   [provider]  dexamethasone (DECADRON) 4 MG tablet Take 1 tablet (4 mg total) by mouth daily. 06/15/20   Borders, Kirt Boys, NP  diltiazem (CARDIZEM CD) 120 MG 24 hr capsule Take 1 capsule (120 mg total) by mouth daily. 02/21/20   Fritzi Mandes, MD  ELIQUIS 2.5 MG TABS tablet Take 2.5 mg by mouth 2 (two) times daily. 01/24/20   [provider]  feeding supplement (BOOST HIGH PROTEIN) LIQD Take 1 Container by mouth 3 (three) times daily between meals.    [provider]  furosemide (LASIX) 20 MG tablet Take 0.5 tablets (10 mg total) by mouth daily. 02/21/20   Fritzi Mandes, MD  lisinopril (ZESTRIL) 2.5 MG tablet Take 2.5 mg by mouth at bedtime.    [provider]  metoprolol succinate (TOPROL-XL) 25 MG 24 hr tablet Take 25 mg by mouth 2 (two) times daily.  01/24/20   [provider]  mirtazapine (REMERON) 7.5 MG tablet Take 7.5 mg by mouth at bedtime. 06/04/20   [provider]  Multiple Vitamin (MULTIVITAMIN WITH MINERALS) TABS tablet Take 1 tablet by mouth daily.    [provider]  Multiple Vitamins-Minerals  (PRESERVISION AREDS PO) Take 1 tablet by mouth 2 (two) times daily.    [provider]  ondansetron (ZOFRAN) 8 MG tablet Take 1 tablet (8 mg total) by mouth 2 (two) times daily as needed for refractory nausea / vomiting. Patient not taking: Reported on 06/11/2020 01/06/20   Lloyd Huger, MD  simvastatin (ZOCOR) 20 MG tablet Take 20 mg by mouth at bedtime. 12/22/19   [provider]  traMADol (ULTRAM) 50 MG tablet Take 1 tablet (50 mg total) by mouth every 12 (twelve) hours as needed. 11/28/19   Sable Feil, PA-C    Allergies Bactrim [sulfamethoxazole-trimethoprim], Ibuprofen, Macrobid [nitrofurantoin monohyd macro], and Tape  Family History  Problem Relation Age of Onset  . Breast cancer Sister 76    Social History Social History   Tobacco Use  . Smoking status: Current Every Day Smoker    Packs/day: 1.00    Years: 70.00    Pack years: 70.00  . Smokeless tobacco: Never Used  Vaping Use  . Vaping Use: Never used  Substance Use Topics  . Alcohol use: No  . Drug use: No    Review of Systems  Constitutional: No fever/chills.  Positive for generalized weakness. Eyes: No visual changes. ENT: No sore throat. Cardiovascular: Denies chest pain. Respiratory: Positive for nonproductive cough, shortness of breath and orthopnea. Gastrointestinal: No abdominal pain.  No nausea, no vomiting.  No diarrhea.  No constipation. Genitourinary: Negative for dysuria.  Positive for decreased urination Musculoskeletal: Negative for back pain. Skin: Negative for rash. Neurological: Negative for headaches, focal weakness or numbness.   ____________________________________________   PHYSICAL EXAM:  VITAL SIGNS: Vitals:   07/21/20 1813  BP: 132/85  Pulse: 70  Resp: 16  Temp: 97.9 F (36.6 C)  SpO2: 100%      Constitutional: Alert and oriented. Well appearing and in no acute distress.  Sitting up in bed, well-appearing and conversational full sentences. Eyes:  Conjunctivae are normal. PERRL. EOMI. Head: Atraumatic. Nose: No congestion/rhinnorhea. Mouth/Throat: Mucous membranes are moist.  Oropharynx non-erythematous. Neck: No stridor. No cervical spine tenderness to palpation. Cardiovascular: Normal rate, regular rhythm. Grossly normal heart sounds.  Good peripheral circulation. Respiratory: Minimal tachypnea to the low 20s, otherwise no evidence of distress.  Bibasilar crackles are present, otherwise clear. Gastrointestinal: Soft , nondistended, nontender to palpation. No abdominal bruits. No CVA tenderness. Musculoskeletal: No lower extremity tenderness.  No joint effusions. No signs of acute trauma. Mild pitting edema to bilateral ankles without overlying skin changes. Neurologic:  Normal speech and language. No gross focal neurologic deficits are appreciated. No gait instability noted. Skin:  Skin is warm, dry and intact. No rash noted. Psychiatric: Mood and affect are normal. Speech and behavior are normal.  ____________________________________________   LABS (all labs ordered are listed, but only abnormal results are displayed)  Labs Reviewed  CBC WITH DIFFERENTIAL/PLATELET - Abnormal; Notable for the following components:      Result Value   RBC 2.99 (*)    Hemoglobin 10.2 (*)    HCT 31.7 (*)    MCV 106.0 (*)    MCH 34.1 (*)    RDW 18.5 (*)    Platelets 138 (*)    All other components within normal limits  COMPREHENSIVE METABOLIC PANEL - Abnormal; Notable for the following components:   Glucose, Bld 152 (*)    BUN 81 (*)    Creatinine, Ser 1.38 (*)    Calcium 8.7 (*)    Total Protein 6.0 (*)    Albumin 3.1 (*)    GFR calc non Af Amer 34 (*)    GFR calc Af Amer 39 (*)    All other components within normal limits  MAGNESIUM - Abnormal; Notable for the following components:   Magnesium 2.5 (*)    All other components within normal limits  BRAIN NATRIURETIC PEPTIDE - Abnormal; Notable for the following components:   B  Natriuretic Peptide 1,041.4 (*)    All other components within normal limits  TROPONIN I (HIGH SENSITIVITY) - Abnormal; Notable for the following components:   Troponin I (High Sensitivity) 39 (*)    All other components within normal limits  SARS CORONAVIRUS 2 BY RT PCR (HOSPITAL ORDER, [facility])  URINALYSIS, COMPLETE (UACMP) WITH MICROSCOPIC  TROPONIN I (HIGH SENSITIVITY)   ____________________________________________  12 Lead EKG A. fib, rate of 73 bpm.  Normal axis.  Right bundle branch block at baseline.  No evidence of acute ischemia.  ____________________________________________  RADIOLOGY  ED MD interpretation: CXR reviewed with pulmonary vascular congestion.  Official radiology report(s): DG Chest 2 View  Result Date: 07/21/2020 CLINICAL DATA:  Productive cough for 3 weeks.  EXAM: CHEST - 2 VIEW COMPARISON:  June 06, 2020 FINDINGS: Cardiomediastinal silhouette is normal. Mediastinal contours appear intact. Calcific atherosclerotic disease of the aorta. Linear peribronchial opacities in bilateral lung bases. Medial left lower lobe lung mass blends with the contour of the descending aorta. Osseous structures are without acute abnormality. Soft tissues are grossly normal. IMPRESSION: Linear peribronchial opacities in bilateral lung bases may represent atelectasis or peribronchial airspace consolidation. Electronically Signed   By: Fidela Salisbury M.D.   On: 07/21/2020 18:43    ____________________________________________   PROCEDURES and INTERVENTIONS  Procedure(s) performed (including Critical Care):  Procedures  Medications  furosemide (LASIX) injection 40 mg (has no administration in time range)    ____________________________________________   MDM / ED COURSE  86 year old woman with history of CHF presenting with generalized weakness and orthopnea most consistent with CHF exacerbation, without evidence of cardiorenal syndrome as  well, requiring medical admission.  Normal vital signs on room air without hypoxia.  Reassuring examination without distress, though she does have mild evidence of volume overload with bibasilar crackles and peripheral edema to her bilateral ankles.  She is otherwise well-appearing without evidence of neurovascular deficits, trauma.  Blood work with worsening renal function and highest BNP in our system.  CXR is congested, without evidence of pneumonia.  EKG is nonischemic with known A. fib, and no evidence of ACS.  Overall clinical picture is most consistent with cardiorenal syndrome.  Initiated diuresis with 40 mg of IV Lasix here in the ED, and due to her medical comorbidities and worsening renal function, will admit the patient to hospitalist medicine for further work-up and management.     ____________________________________________   FINAL CLINICAL IMPRESSION(S) / ED DIAGNOSES  Final diagnoses:  Acute on chronic systolic congestive heart failure (HCC)  Generalized weakness  Cardiorenal syndrome with renal failure, stage 1-4 or unspecified chronic kidney disease, with heart failure ([place])  AKI (acute kidney injury) [facility])     ED Discharge Orders    None       Esraa Seres   Note:  This document was prepared using Systems analyst and may include unintentional dictation errors.   Vladimir Crofts, MD 07/21/20 [phone]

## 2020-07-21 NOTE — ED Notes (Signed)
Pt had small skin tear to left wrist, site bandaged with non-adhesive gauze and wrapped with kerlex.

## 2020-07-21 NOTE — ED Notes (Signed)
Pt resting in bed at this time, family at bedside. Denies any needs, will continue to monitor

## 2020-07-21 NOTE — ED Notes (Signed)
Pt transported to xray at this time

## 2020-07-21 NOTE — ED Notes (Signed)
meds reconciled and changed with daughter Vania Rea att

## 2020-07-21 NOTE — ED Triage Notes (Signed)
Pt via EMS from home. Pt c/o increased generalized weakness and decrease in fluid intake. Pt also has a productive cough that started yesterday. Sputum is green and thick. Pt has a hx of CHF, COPD, lung cancer, breast cancer. Pt did have the COVID vaccine. Denies pain at this time. Pt A&Ox4 and NAD at this time

## 2020-07-21 NOTE — H&P (Signed)
History and Physical    Tomia Enlow Odea BTD:[id number] DOB: [birth year] DOA: 07/21/2020  PCP: Ezequiel Kayser, MD   Patient coming from: Home  Chief Complaint: Hartness of breath, orthopnea, generalized weakness  HPI: [name] [name] is a 86 y.o. female with medical history significant for  HTN, HLD, A. fib on Eliquis, amiodarone, diltiazem, CAD and systolic CHF.  Follows with [place] cardiology, Dr. Nehemiah Massed. Patient presents to the Hospital with her daughter with concerns for 3 week of progressively worsening generalized weakness with development of orthopnea and nonproductive cough in the past few days. She has increase in swelling of legs the past few days. Has had to elevate head of bed to sleep as she feels smothered if she lays flat for the past few nights which is a new symptom for her.  She has not been able to ambulate for the last few days due to the generalized weakness.  She does have home health with home physical therapy twice a week and has been ambulating with a walker or assistance but for the last 2 days she has not been able to walk despite having assistance.  She has had required complete assistance to even stand up due to the weakness. She reports decreased urination frequency and volume past few days. Denies dysuria, hematuria or new incontinence.  Denies fevers, flank pains, chest pain, syncope, abdominal pain or vomiting. 69 of history is provided by daughter.  Daughter cares for the patient daily and lives with her.  Daughter reports generalized progressive weakness, poor ambulation with decreased exercise tolerance, coughing or shortness of breath while lying flat and a nonproductive cough.  Daughter reports compliance with medications.  ED Course:  Pt given lasix in ER. Has CXR showing fluid overload with no acute infiltrate or consolidation.  BNP is elevated.   Review of Systems:  General: Reports generalized weakness. Denies fever, chills, weight loss,  night sweats.  Denies dizziness.  Denies change in appetite HENT: Denies head trauma, headache, denies change in hearing, tinnitus.  Denies nasal congestion or bleeding.  Denies sore throat, sores in mouth.  Denies difficulty swallowing Eyes: Denies blurry vision, pain in eye, drainage.  Denies discoloration of eyes. Neck: Denies pain.  Denies swelling.  Denies pain with movement. Cardiovascular: Denies chest pain, palpitations.  Reports edema. Reports orthopnea Respiratory: Reports shortness of breath. Has mild nonproductive cough.  Denies wheezing.  Denies sputum production Gastrointestinal: Denies abdominal pain, swelling.  Denies nausea, vomiting, diarrhea.  Denies melena.  Denies hematemesis. Musculoskeletal: Reports limitation of movement and ambulation past few weeks.  Denies deformity.  Denies pain.  Denies arthralgias or myalgias. Genitourinary: Denies pelvic pain.  Denies urinary frequency or hesitancy.  Denies dysuria.  Skin: Denies rash.  Denies petechiae, purpura, ecchymosis. Neurological: Denies headache.  Denies syncope.  Denies seizure activity.  Denies paresthesia.  Denies slurred speech, drooping face.  Denies visual change. Psychiatric: Denies depression, anxiety.    Past Medical History:  Diagnosis Date  . Arthritis   . Breast cancer ([place]) 08/19/2012   LT LUMPECTOMY, radiation tx.   Marland Kitchen COPD (chronic obstructive pulmonary disease) ([place])   . Coronary artery disease   . Dysrhythmia   . Hailey-hailey disease   . Hyperlipemia   . Hypertension   . Lung cancer ([place]) 2014   RT LUNG, radiation tx  . Migraine   . Myocardial infarction ([place])   . Osteoporosis   . Personal history of radiation therapy 2013   left breast ca  . Personal history  of radiation therapy 2014   lung ca  . Radiation 2013   FOR BREAST CA  . Radiation 2014   FOR LUNG CA  . Renal artery stenosis ([place])   . Renal insufficiency     Past Surgical History:  Procedure Laterality Date  . ABDOMINAL  HYSTERECTOMY    . APPENDECTOMY    . BREAST BIOPSY Left 08/03/2012   invasive mammary carcinoma, u/s guided bx  . 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 cardiac stents    . PARATHYROIDECTOMY    . TONSILLECTOMY      Social History  reports that she has been smoking. She has a 70.00 pack-year smoking history. She has never used smokeless tobacco. She reports that she does not drink alcohol and does not use drugs.  Allergies  Allergen Reactions  . Bactrim [Sulfamethoxazole-Trimethoprim] Other (See Comments)    Reduced kidney function.  Per Dr. Holley Raring should not take  . Ibuprofen     Reduced kidney function. Per Dr. Holley Raring should not take  . Macrobid [employer] Macro] Other (See Comments)    Reduced Kidney function.  Should no longer take per Dr. Holley Raring  . Tape Rash    Family History  Problem Relation Age of Onset  . Breast cancer Sister 67     Prior to Admission medications   Medication Sig Start Date End Date Taking? Authorizing Provider  albuterol (PROAIR HFA) 108 (90 Base) MCG/ACT inhaler Inhale 2 puffs into the lungs every 6 (six) hours as needed for wheezing or shortness of breath.  08/15/15   [provider]  calcipotriene-betamethasone (TACLONEX) ointment Apply 1 application topically daily. To legs and back as needed Patient not taking: Reported on 06/14/2020    [provider]  clotrimazole (MYCELEX) 10 MG troche Take 10 mg by mouth 5 (five) times daily. 06/04/20   [provider]  dexamethasone (DECADRON) 4 MG tablet Take 1 tablet (4 mg total) by mouth daily. 06/15/20   Borders, Kirt Boys, NP  diltiazem (CARDIZEM CD) 120 MG 24 hr capsule Take 1 capsule (120 mg total) by mouth daily. 02/21/20   Fritzi Mandes, MD  ELIQUIS 2.5 MG TABS tablet Take 2.5 mg by mouth 2 (two) times daily. 01/24/20   [provider]  feeding supplement (BOOST HIGH PROTEIN) LIQD Take 1 Container by  mouth 3 (three) times daily between meals.    [provider]  furosemide (LASIX) 20 MG tablet Take 0.5 tablets (10 mg total) by mouth daily. 02/21/20   Fritzi Mandes, MD  lisinopril (ZESTRIL) 2.5 MG tablet Take 2.5 mg by mouth at bedtime.    [provider]  metoprolol succinate (TOPROL-XL) 25 MG 24 hr tablet Take 25 mg by mouth 2 (two) times daily.  01/24/20   [provider]  mirtazapine (REMERON) 7.5 MG tablet Take 7.5 mg by mouth at bedtime. 06/04/20   [provider]  Multiple Vitamin (MULTIVITAMIN WITH MINERALS) TABS tablet Take 1 tablet by mouth daily.    [provider]  Multiple Vitamins-Minerals (PRESERVISION AREDS PO) Take 1 tablet by mouth 2 (two) times daily.    [provider]  ondansetron (ZOFRAN) 8 MG tablet Take 1 tablet (8 mg total) by mouth 2 (two) times daily as needed for refractory nausea / vomiting. Patient not taking: Reported on 06/11/2020 01/06/20   Lloyd Huger, MD  simvastatin (ZOCOR) 20 MG tablet Take 20 mg by mouth at bedtime. 12/22/19  [provider]  traMADol (ULTRAM) 50 MG tablet Take 1 tablet (50 mg total) by mouth every 12 (twelve) hours as needed. 11/28/19   Sable Feil, PA-C    Physical Exam: Vitals:   07/21/20 1813 07/21/20 1814 07/21/20 2000  BP: 132/85  117/74  Pulse: 70  84  Resp: 16  (!) 28  Temp: 97.9 F (36.6 C)    TempSrc: Oral    SpO2: 100%  97%  Weight:  48.5 kg   Height:  5\' 3"  (1.6 m)     Constitutional: NAD, calm, comfortable Vitals:   07/21/20 1813 07/21/20 1814 07/21/20 2000  BP: 132/85  117/74  Pulse: 70  84  Resp: 16  (!) 28  Temp: 97.9 F (36.6 C)    TempSrc: Oral    SpO2: 100%  97%  Weight:  48.5 kg   Height:  5\' 3"  (1.6 m)    General: WDWN, Alert and oriented x3.  Eyes: EOMI, PERRL, lids and conjunctivae normal.  Sclera nonicteric HENT:  [place]/AT, external ears normal.  Nares patent without epistasis.  Mucous membranes are moist. Posterior pharynx clear of  any exudate or lesions. Neck: Soft, normal range of motion, supple, no masses, no thyromegaly.  Trachea midline Respiratory: Equal breath sounds.  Mildly diminished breath sounds in bases.  Mild diffuse rales and mild bibasilar crackles, no wheezing. Normal respiratory effort. No accessory muscle use.  Cardiovascular: Irregularly irregular rhythm with normal rate, Has 2/6 systolic murmur. No rubs / gallops. Mild lower extremity edema. 1+ pedal pulses. Mild JVD.  Abdomen: Soft, no tenderness, nondistended, no rebound or guarding.  No masses palpated. No hepatosplenomegaly. Bowel sounds normoactive Musculoskeletal: FROM. Has clubbing of digits. No/ cyanosis. No joint deformity upper and lower extremities. no contractures. Normal muscle tone.  Skin: Warm, dry, intact no rashes, lesions, ulcers. No induration Neurologic: CN 2-12 grossly intact.  Normal speech.  Sensation intact, patella DTR +1 bilaterally. Strength 3/5 in all extremities.   Psychiatric: Normal judgment and insight.  Normal mood.    Labs on Admission: I have personally reviewed following labs and imaging studies  CBC: Recent Labs  Lab 07/21/20 1825  WBC 11.0*  NEUTROABS 9.7*  HGB 10.2*  HCT 31.7*  MCV 106.0*  PLT 138*    Basic Metabolic Panel: Recent Labs  Lab 07/21/20 1825  NA 142  K 4.9  CL 110  CO2 22  GLUCOSE 152*  BUN 81*  CREATININE 1.38*  CALCIUM 8.7*  MG 2.5*    GFR: Estimated Creatinine Clearance: 20.7 mL/min (A) (by C-G formula based on SCr of 1.38 mg/dL (H)).  Liver Function Tests: Recent Labs  Lab 07/21/20 1825  AST 26  ALT 42  ALKPHOS 113  BILITOT 0.7  PROT 6.0*  ALBUMIN 3.1*    Urine analysis:    Component Value Date/Time   COLORURINE YELLOW (A) 07/10/2020 1135   APPEARANCEUR HAZY (A) 07/10/2020 1135   APPEARANCEUR Clear 03/05/2015 2050   LABSPEC 1.012 07/10/2020 1135   LABSPEC 1.020 03/05/2015 2050   PHURINE 6.0 07/10/2020 1135   GLUCOSEU NEGATIVE 07/10/2020 1135   GLUCOSEU  Negative 03/05/2015 2050   HGBUR NEGATIVE 07/10/2020 1135   BILIRUBINUR NEGATIVE 07/10/2020 1135   BILIRUBINUR Negative 03/05/2015 2050   KETONESUR NEGATIVE 07/10/2020 1135   PROTEINUR NEGATIVE 07/10/2020 1135   NITRITE POSITIVE (A) 07/10/2020 1135   LEUKOCYTESUR NEGATIVE 07/10/2020 1135   LEUKOCYTESUR Trace 03/05/2015 2050    Radiological Exams on Admission: DG Chest 2 View  Result Date:  07/21/2020 CLINICAL DATA:  Productive cough for 3 weeks. EXAM: CHEST - 2 VIEW COMPARISON:  June 06, 2020 FINDINGS: Cardiomediastinal silhouette is normal. Mediastinal contours appear intact. Calcific atherosclerotic disease of the aorta. Linear peribronchial opacities in bilateral lung bases. Medial left lower lobe lung mass blends with the contour of the descending aorta. Osseous structures are without acute abnormality. Soft tissues are grossly normal. IMPRESSION: Linear peribronchial opacities in bilateral lung bases may represent atelectasis or peribronchial airspace consolidation. Electronically Signed   By: Fidela Salisbury M.D.   On: 07/21/2020 18:43    EKG: Independently reviewed. EKG is reviewed and is atrial fibrillation with controlled rate. RBBB. Prolonged QTc is 508.  Assessment/Plan Principal Problem:   Acute CHF (congestive heart failure) ([place]) Ms. Nishida is admitted to cardiac unit with acute CHF.  obtain echocardiogram to evaluate wall motion, valvular function and EF. Check serial troponin levels. Diurese with Lasix 40 mg twice daily for the next 2 days.  Monitor INO's.  Monitor daily weight.  Active Problems:   Cardiorenal syndrome Patient with chronic kidney disease with mildly decreased function.  In setting of CHF patient with cardiorenal syndrome and will gently diurese and monitor. Recheck electrolytes and renal function in morning Pt is on chronic dexamethasone daily for the past 3 months so cannot abruptly stop this medication. Family unsure why she was started on  dexamethasone.     Stage 3 chronic kidney disease Disease not significantly worse.  Recheck electrolytes renal function morning    Benign essential hypertension Continue home medications of metoprolol, lisinopril, Cardizem    Generalized weakness Consult PT for evaluation of weakness and mobility issues.     Atrial fibrillation, chronic ([place]) Ms. Altice is on eliquis for chronic anticoagulation. Continue amiodarone and cardizem.     Prolonged QT interval Avoid medications which could further prolong QT interval. Continue to monitor.      COPD Mild. Continue albuterol as needed for SOB, wheezing.     Current tobacco use Nicotine patch provided to help prevent nicotine withdrawal symptoms.       DVT prophylaxis: Patient is anticoagulated on Eliquis which will be continued Code Status:   DNR.  CODE STATUS is verified with patient and her granddaughters in the room and her guardian. Family Communication:  Diagnosed plan discussed with patient and family at bedside.  Questions answered.  They agree with plan.  Further agrees to follow as clinical indicated Disposition Plan:   Patient is from:  Home  Anticipated DC to:  Home versus SNF  Anticipated DC date:  Anticipate greater than 2 midnight stay in the hospital to treat acute medical condition  Anticipated DC barriers: No identified barriers to discharge at this time.  If patient will need SNF placement then discharge planning will                                                                      need to work with patient and family to arrange bed   Admission status:  Inpatient  Severity of Illness: The appropriate patient status for this patient is INPATIENT. Inpatient status is judged to be reasonable and necessary in order to provide the required intensity of service to ensure the patient's safety. The patient's presenting symptoms,  physical exam findings, and initial radiographic and laboratory data in the context of  their chronic comorbidities is felt to place them at high risk for further clinical deterioration. Furthermore, it is not anticipated that the patient will be medically stable for discharge from the hospital within 2 midnights of admission. The following factors support the patient status of inpatient.    * I certify that at the point of admission it is my clinical judgment that the patient will require inpatient hospital care spanning beyond 2 midnights from the point of admission due to high intensity of service, high risk for further deterioration and high frequency of surveillance required.Yevonne Aline Aiyden Lauderback MD [facility]  How to contact the [facility] Attending or Consulting provider [place] or covering provider during after hours [place], for this patient?   1. Check the care team in [facility] and look for a) attending/consulting [facility] provider listed and b) the [facility] team listed 2. Log into [email or web address] and use [place]'s universal password to access. If you do not have the password, please contact the hospital operator. 3. Locate the [facility] provider you are looking for under [facility] and page to a number that you can be directly reached. 4. If you still have difficulty reaching the provider, please page the [facility] (Director on Call) for the Hospitalists listed on amion for assistance.  07/21/2020, 8:34 PM

## 2020-07-21 NOTE — ED Notes (Signed)
Called pharmacy for verification of pt meds in mar. Alex in pharmacy states they are waiting for med rec to go over meds with pt. Will verify after meds are reviewed by med rec

## 2020-07-22 ENCOUNTER — Inpatient Hospital Stay: Payer: Medicare Other

## 2020-07-22 DIAGNOSIS — L0390 Cellulitis, unspecified: Secondary | ICD-10-CM | POA: Diagnosis present

## 2020-07-22 DIAGNOSIS — L89152 Pressure ulcer of sacral region, stage 2: Secondary | ICD-10-CM | POA: Diagnosis present

## 2020-07-22 DIAGNOSIS — I5033 Acute on chronic diastolic (congestive) heart failure: Secondary | ICD-10-CM | POA: Diagnosis present

## 2020-07-22 LAB — BASIC METABOLIC PANEL
Anion gap: 9 (ref 5–15)
BUN: 81 mg/dL — ABNORMAL HIGH (ref 8–23)
CO2: 26 mmol/L (ref 22–32)
Calcium: 8.8 mg/dL — ABNORMAL LOW (ref 8.9–10.3)
Chloride: 107 mmol/L (ref 98–111)
Creatinine, Ser: 1.35 mg/dL — ABNORMAL HIGH (ref 0.44–1.00)
GFR calc Af Amer: 40 mL/min — ABNORMAL LOW (ref >60)
GFR calc non Af Amer: 34 mL/min — ABNORMAL LOW (ref >60)
Glucose, Bld: 107 mg/dL — ABNORMAL HIGH (ref 70–99)
Potassium: 4.7 mmol/L (ref 3.5–5.1)
Sodium: 142 mmol/L (ref 135–145)

## 2020-07-22 LAB — URINALYSIS, COMPLETE (UACMP) WITH MICROSCOPIC
Bilirubin Urine: NEGATIVE
Glucose, UA: NEGATIVE mg/dL
Hgb urine dipstick: NEGATIVE
Ketones, ur: NEGATIVE mg/dL
Leukocytes,Ua: NEGATIVE
Nitrite: NEGATIVE
Protein, ur: NEGATIVE mg/dL
Specific Gravity, Urine: 1.009 (ref 1.005–1.030)
Squamous Epithelial / HPF: NONE SEEN (ref 0–5)
pH: 5 (ref 5.0–8.0)

## 2020-07-22 LAB — PROCALCITONIN: Procalcitonin: <0.1 ng/mL

## 2020-07-22 MED ORDER — OXYCODONE HCL 5 MG PO TABS
5.0000 mg | ORAL_TABLET | Freq: Four times a day (QID) | ORAL | Status: DC | PRN
  Administered 2020-07-22 – 2020-07-23 (×2): 5 mg via ORAL
  Filled 2020-07-22 (×3): qty 1

## 2020-07-22 MED ORDER — SODIUM CHLORIDE 0.9 % IV SOLN
1.0000 g | Freq: Once | INTRAVENOUS | Status: AC
  Administered 2020-07-22: 1 g via INTRAVENOUS
  Filled 2020-07-22: qty 1

## 2020-07-22 MED ORDER — CEPHALEXIN 250 MG PO CAPS
250.0000 mg | ORAL_CAPSULE | Freq: Three times a day (TID) | ORAL | Status: DC
  Filled 2020-07-22 (×2): qty 1

## 2020-07-22 MED ORDER — LORAZEPAM 0.5 MG PO TABS
0.5000 mg | ORAL_TABLET | Freq: Once | ORAL | Status: AC
  Administered 2020-07-22: 0.5 mg via ORAL
  Filled 2020-07-22: qty 1

## 2020-07-22 NOTE — Progress Notes (Signed)
Physical Therapy Evaluation Patient Details Name: [name] [name] [facility] MRN: [id number] DOB: Feb 22, 1928 Today's Date: 07/22/2020   History of Present Illness  Per MD note:[name] [name] is a 86 y.o. female with medical history significant for  HTN, HLD, A. fib on Eliquis, amiodarone, diltiazem, CAD and systolic CHF. Follows with [name], Dr. Nehemiah Massed. Patient presents to the Hospital with her daughter with concerns for 3 week of progressively worsening generalized weakness with development of orthopnea and nonproductive cough in the past few days. She has increase in swelling of legs the past few days. Has had to elevate head of bed to sleep as she feels smothered if she lays flat for the past few nights which is a new symptom for her.  She has not been able to ambulate for the last few days due to the generalized weakness.  She does have home health with home physical therapy twice a week and has been ambulating with a walker or assistance but for the last 2 days she has not been able to walk despite having assistance.  She has had required complete assistance to even stand up due to the weakness. She reports decreased urination frequency and volume past few days. Denies dysuria, hematuria or new incontinence. Denies fevers, flank pains, chest pain, syncope, abdominal pain or vomiting.  Clinical Impression  Patient agrees to PT evaluation. Pt reports no pain. Pt lives with daughter and has HHPT and [place] nsg and has steps into her home.   Pt was able to stand with max assist and RW with HHPT prior to this hospital admission. Pt has 2/5 strength BLE hip and knee and is max A for bed mobility. She is dependent for  transfers due to profound weakness and is unable to ambulate. Pt has fair static sitting balance with UE support  and poor dynamic sitting  balance and needs mod assist. Pt will continue to benefit from skilled PT to improve mobility and strength.    Follow Up Recommendations  Home health PT;Supervision/Assistance - 24 hour    Equipment Recommendations  None recommended by PT    Recommendations for Other Services       Precautions / Restrictions Precautions Precautions: None Restrictions Weight Bearing Restrictions: No      Mobility  Bed Mobility Overal bed mobility: Needs Assistance Bed Mobility: Supine to Sit;Sit to Supine     Supine to sit: Max assist Sit to supine: Max assist   General bed mobility comments: VC for safety and sequencing  Transfers Overall transfer level:  (depenent)                  Ambulation/Gait Ambulation/Gait assistance:  (unable)              Stairs            Wheelchair Mobility    Modified Rankin (Stroke Patients Only)       Balance Overall balance assessment: Needs assistance Sitting-balance support: Bilateral upper extremity supported;Feet unsupported Sitting balance-Leahy Scale: Fair   Postural control: Posterior lean                                   Pertinent Vitals/Pain Pain Assessment: No/denies pain    Home Living Family/patient expects to be discharged to:: Private residence Living Arrangements: Children Available Help at Discharge: Family   Home Access: Stairs to enter       Home Equipment: Environmental consultant -  2 wheels;Bedside commode;Wheelchair - manual      Prior Function Level of Independence: Needs assistance   Gait / Transfers Assistance Needed: Patient was max assist to stand with RW           Hand Dominance        Extremity/Trunk Assessment   Upper Extremity Assessment Upper Extremity Assessment: Generalized weakness    Lower Extremity Assessment Lower Extremity Assessment: RLE deficits/detail;LLE deficits/detail RLE Deficits / Details: 2/5 hip flex, 2+/5 hip abd LLE Deficits / Details: 2/5 hip flex, 2+/5 hip abd       Communication   Communication: No difficulties  Cognition Arousal/Alertness: Awake/alert Behavior During  Therapy: WFL for tasks assessed/performed Overall Cognitive Status: Within Functional Limits for tasks assessed                                        General Comments      Exercises     Assessment/Plan    PT Assessment Patient needs continued PT services  PT Problem List Decreased strength;Decreased range of motion;Decreased activity tolerance;Decreased balance       PT Treatment Interventions Gait training;Therapeutic activities;Therapeutic exercise;Balance training    PT Goals (Current goals can be found in the Care Plan section)  Acute Rehab PT Goals Patient Stated Goal:  (to get stronger) PT Goal Formulation: With patient Time For Goal Achievement: 08/05/20 Potential to Achieve Goals: Fair    Frequency Min 2X/week   Barriers to discharge Inaccessible home environment      Co-evaluation               AM-PAC PT "6 Clicks" Mobility  Outcome Measure Help needed turning from your back to your side while in a flat bed without using bedrails?: A Lot Help needed moving from lying on your back to sitting on the side of a flat bed without using bedrails?: Total Help needed moving to and from a bed to a chair (including a wheelchair)?: Total Help needed standing up from a chair using your arms (e.g., wheelchair or bedside chair)?: Total Help needed to walk in hospital room?: Total Help needed climbing 3-5 steps with a railing? : Total 6 Click Score: 7    End of Session Equipment Utilized During Treatment: Gait belt   Patient left: in bed Nurse Communication: Mobility status PT Visit Diagnosis: Unsteadiness on feet (R26.81);Muscle weakness (generalized) (M62.81);Difficulty in walking, not elsewhere classified (R26.2)    Time: 7782-4235 PT Time Calculation (min) (ACUTE ONLY): 20 min   Charges:   PT Evaluation $PT Eval Low Complexity: 1 Low PT Treatments $Therapeutic Activity: 8-22 mins          Arelia Sneddon S , PT DPT 07/22/2020,  11:28 AM

## 2020-07-22 NOTE — Progress Notes (Signed)
[facility] PROGRESS NOTE    [name] [name] [facility]  TJQ:[id number] DOB: Aug 19, 1928 DOA: 07/21/2020 PCP: Ezequiel Kayser, MD      Brief Narrative:  [name] [name] is a 86 y.o. F with Afib on Eliquis, COPD not on O2, lung CA s/p partial lobectomy still on chemo, BrCA remote, and HTN who presented with shortness of breath with exertion, orthopnea, leg swelling, and weakness.   In the ER, CXR showed bilateral opacities, BNP was elevated, and she appeared to have swelling on exam.  Was started on Lasix and admitted.          Assessment & Plan:  Acute on chronic diastolic CHF Baseline EF 76-22%.  Presents here with swelling, orthopnea, CXR opacities, dry cough, and DOE.  -Furosemide 40 mg IV twice a day  -K supplement -Strict I/Os, daily weights, telemetry  -Daily monitoring renal function -Follow echocardiogram   Cellulitis There is also an area of pain and swelling in the right foot, which is not fluctuant, but red, warm, tender and well-demarcated, likely cellulitis.  Doubt gout.  Doubt DVT given distribution and anticoagulation. -Start ceftriaxone  CKD 3B Baseline creatinine 1.1-1.4, stable relative to baseline  Chronic atrial fibrillation Acquired thrombophilia -Continue amiodarone, diltiazem, metoprolol -Continue apixaban  COPD No wheezing to suggest bronchospasm  Lung cancer  Hypertension -Continue diltiazem, metoprolol, simvastatin, furosemide  Chronic steroid use Family unsure why the patient takes this -Continue dexamethasone  Mood disorder -Continue mirtazapine  Severe protein calorie malnutrition As evidenced by severe loss of subcutaneous muscle mass and fat, BMI 18, and pressure injury.              Disposition: Status is: Inpatient  Remains inpatient appropriate because:The patient is still severely symptomatic, too weak to get out of bed, tired.   Dispo: The patient is from: Home              Anticipated d/c  is to: SNF              Anticipated d/c date is: 1 day              Patient currently is not medically stable to d/c.   The patient is a 86 year old female admitted with CHF flare, cellulitis.  She is significantly debilitated, and although we will likely be able to transition her to oral agent tomorrow, I suspect she will need SNF placement due to the fact that she is normally independent, able to ambulate up stairs several times a day, and independent on and off the commode, but for the last week and a half has been unable to stand without 2 person assist.           MDM: The below labs and imaging reports were reviewed and summarized above.  Medication management as above.  This is a severe exacerbation of her chronic disease.     DVT prophylaxis: apixaban (ELIQUIS) tablet 2.5 mg Start: 07/21/20 2200 apixaban (ELIQUIS) tablet 2.5 mg  Code Status: DNR Family Communication: Daughter at the bedside    Consultants:     Procedures:   Echocardiogram pending  Antimicrobials:   Ceftriaxone 9/5>>  Culture data:              Subjective: Patient has pain, redness, and swelling of her right ankle.  She has no confusion, fever.  She has some thick sputum which is new.  She has no chest pain.  She is out of breath, she is extremely weak.  Objective: Vitals:  07/22/20 0600 07/22/20 0630 07/22/20 1206 07/22/20 1239  BP: 126/86 110/79 113/71 108/70  Pulse: 78 70 76 72  Resp: (!) 22 (!) 25 (!) 22 17  Temp:    97.6 F (36.4 C)  TempSrc:    Oral  SpO2: 100% 97% 97% 91%  Weight:      Height:        Intake/Output Summary (Last 24 hours) at 07/22/2020 1332 Last data filed at 07/21/2020 2331 Gross per 24 hour  Intake --  Output 400 ml  Net -400 ml   Filed Weights   07/21/20 1814  Weight: 48.5 kg    Examination: General appearance: Thin elderly adult female, alert and in no acute distress, except obvious pain with any movement of her right leg.   HEENT:  Anicteric, conjunctiva pink, lids and lashes normal. No nasal deformity, discharge, epistaxis.  Lips dry, oropharynx moist, no oral lesions, hearing diminished.   Skin: Warm and dry.  No jaundice.  No suspicious rashes or lesions other than a red patch on her right dorsal foot. Cardiac: RRR, nl S1-S2, no murmurs appreciated.  Capillary refill is brisk.  JVP normal.  No LE edema.  Radial pulses 2+ and symmetric. Respiratory: Normal respiratory rate and rhythm.  Diminished at bilateral bases, rales at bilateral bases, more on the right.    Abdomen: Abdomen soft.  No TTP or guarding. No ascites, distension, hepatosplenomegaly.   MSK: No deformities or effusions.  Moderate loss of subcutaneous muscle mass and fat Neuro: Awake and alert.  EOMI, moves all extremities with severe generalized weakness, but symmetric during. Speech fluent.    Psych: Sensorium intact and responding to questions, attention normal. Affect flat.  Judgment and insight appear normal.    Data Reviewed: I have personally reviewed following labs and imaging studies:  CBC: Recent Labs  Lab 07/21/20 1825  WBC 11.0*  NEUTROABS 9.7*  HGB 10.2*  HCT 31.7*  MCV 106.0*  PLT 681*   Basic Metabolic Panel: Recent Labs  Lab 07/21/20 1825 07/22/20 0458  NA 142 142  K 4.9 4.7  CL 110 107  CO2 22 26  GLUCOSE 152* 107*  BUN 81* 81*  CREATININE 1.38* 1.35*  CALCIUM 8.7* 8.8*  MG 2.5*  --    GFR: Estimated Creatinine Clearance: 21.2 mL/min (A) (by C-G formula based on SCr of 1.35 mg/dL (H)). Liver Function Tests: Recent Labs  Lab 07/21/20 1825  AST 26  ALT 42  ALKPHOS 113  BILITOT 0.7  PROT 6.0*  ALBUMIN 3.1*   No results for input(s): LIPASE, AMYLASE in the last 168 hours. No results for input(s): AMMONIA in the last 168 hours. Coagulation Profile: No results for input(s): INR, PROTIME in the last 168 hours. Cardiac Enzymes: No results for input(s): CKTOTAL, CKMB, CKMBINDEX, TROPONINI in the last 168  hours. BNP (last 3 results) No results for input(s): PROBNP in the last 8760 hours. HbA1C: No results for input(s): HGBA1C in the last 72 hours. CBG: No results for input(s): GLUCAP in the last 168 hours. Lipid Profile: No results for input(s): CHOL, HDL, LDLCALC, TRIG, CHOLHDL, LDLDIRECT in the last 72 hours. Thyroid Function Tests: Recent Labs    07/21/20 1825  TSH 1.298   Anemia Panel: No results for input(s): VITAMINB12, FOLATE, FERRITIN, TIBC, IRON, RETICCTPCT in the last 72 hours. Urine analysis:    Component Value Date/Time   COLORURINE STRAW (A) 07/21/2020 1825   APPEARANCEUR CLEAR (A) 07/21/2020 1825   APPEARANCEUR Clear 03/05/2015 2050  LABSPEC 1.009 07/21/2020 1825   LABSPEC 1.020 03/05/2015 2050   PHURINE 5.0 07/21/2020 1825   GLUCOSEU NEGATIVE 07/21/2020 1825   GLUCOSEU Negative 03/05/2015 2050   HGBUR NEGATIVE 07/21/2020 1825   BILIRUBINUR NEGATIVE 07/21/2020 1825   BILIRUBINUR Negative 03/05/2015 2050   KETONESUR NEGATIVE 07/21/2020 1825   PROTEINUR NEGATIVE 07/21/2020 1825   NITRITE NEGATIVE 07/21/2020 1825   LEUKOCYTESUR NEGATIVE 07/21/2020 1825   LEUKOCYTESUR Trace 03/05/2015 2050   Sepsis Labs: @LABRCNTIP (procalcitonin:4,lacticacidven:4)  ) Recent Results (from the past 240 hour(s))  SARS Coronavirus 2 by RT PCR (hospital order, performed in [place] hospital lab) Nasopharyngeal Nasopharyngeal Swab     Status: None   Collection Time: 07/21/20  8:29 PM   Specimen: Nasopharyngeal Swab  Result Value Ref Range Status   SARS Coronavirus 2 NEGATIVE NEGATIVE Final    Comment: (NOTE) SARS-CoV-2 target nucleic acids are NOT DETECTED.  The SARS-CoV-2 RNA is generally detectable in upper and lower respiratory specimens during the acute phase of infection. The lowest concentration of SARS-CoV-2 viral copies this assay can detect is 250 copies / mL. A negative result does not preclude SARS-CoV-2 infection and should not be used as the sole basis for  treatment or other patient management decisions.  A negative result may occur with improper specimen collection / handling, submission of specimen other than nasopharyngeal swab, presence of viral mutation(s) within the areas targeted by this assay, and inadequate number of viral copies (<250 copies / mL). A negative result must be combined with clinical observations, patient history, and epidemiological information.  Fact Sheet for Patients:   StrictlyIdeas.no  Fact Sheet for Healthcare Providers: BankingDealers.co.za  This test is not yet approved or  cleared by the [place] FDA and has been authorized for detection and/or diagnosis of SARS-CoV-2 by FDA under an Emergency Use Authorization (EUA).  This EUA will remain in effect (meaning this test can be used) for the duration of the COVID-19 declaration under Section 564(b)(1) of the Act, 21 U.S.C. section 360bbb-3(b)(1), unless the authorization is terminated or revoked sooner.  Performed at [facility], [address] [place], [place] [address]          Radiology Studies: DG Chest 2 View  Result Date: 07/21/2020 CLINICAL DATA:  Productive cough for 3 weeks. EXAM: CHEST - 2 VIEW COMPARISON:  June 06, 2020 FINDINGS: Cardiomediastinal silhouette is normal. Mediastinal contours appear intact. Calcific atherosclerotic disease of the aorta. Linear peribronchial opacities in bilateral lung bases. Medial left lower lobe lung mass blends with the contour of the descending aorta. Osseous structures are without acute abnormality. Soft tissues are grossly normal. IMPRESSION: Linear peribronchial opacities in bilateral lung bases may represent atelectasis or peribronchial airspace consolidation. Electronically Signed   By: Fidela Salisbury M.D.   On: 07/21/2020 18:43        Scheduled Meds: . amiodarone  200 mg Oral BID  . apixaban  2.5 mg Oral BID  . [START ON  07/23/2020] cephALEXin  250 mg Oral Q8H  . dexamethasone  4 mg Oral Daily  . diltiazem  120 mg Oral Daily  . furosemide  40 mg Intravenous BID  . metoprolol succinate  25 mg Oral TID AC  . mirtazapine  15 mg Oral QHS  . potassium chloride  10 mEq Oral BID  . simvastatin  10 mg Oral QHS  . sodium chloride flush  3 mL Intravenous Q12H   Continuous Infusions: . sodium chloride    . cefTRIAXone (ROCEPHIN)  IV  LOS: 1 day    Time spent: 35 minutes    Edwin Dada, MD [facility] 07/22/2020, 1:32 PM     Please page though [place] or Epic secure chat:  For [employer], Adult nurse

## 2020-07-22 NOTE — ED Notes (Signed)
Continues to sleep in bed, no distress noted, pt has call bell by right arm.

## 2020-07-22 NOTE — ED Notes (Signed)
Pt moved to room 19 at this time, pt connected to monitor, denies any needs, repositioned in bed for comfort. Pt lights dimmed and TV on per request. States she will return to sleep at this time. Call bell at right side of bed and educated on use. Will continue to monitor.

## 2020-07-22 NOTE — ED Notes (Signed)
Pt asleep until this nurse wakes pt up and repositions her. Pt in bed with lights off in room, call bell by left arm

## 2020-07-22 NOTE — ED Notes (Signed)
Pt remains in bed at this time, asleep and call bell by side

## 2020-07-23 ENCOUNTER — Inpatient Hospital Stay
Admit: 2020-07-23 | Discharge: 2020-07-23 | Disposition: A | Discharge status: Home / Self Care | Payer: Medicare Other | Attending: Family Medicine | Admitting: Family Medicine

## 2020-07-23 LAB — BASIC METABOLIC PANEL
Anion gap: 12 (ref 5–15)
BUN: 77 mg/dL — ABNORMAL HIGH (ref 8–23)
CO2: 25 mmol/L (ref 22–32)
Calcium: 8.6 mg/dL — ABNORMAL LOW (ref 8.9–10.3)
Chloride: 105 mmol/L (ref 98–111)
Creatinine, Ser: 1.29 mg/dL — ABNORMAL HIGH (ref 0.44–1.00)
GFR calc Af Amer: 42 mL/min — ABNORMAL LOW (ref >60)
GFR calc non Af Amer: 36 mL/min — ABNORMAL LOW (ref >60)
Glucose, Bld: 155 mg/dL — ABNORMAL HIGH (ref 70–99)
Potassium: 4.2 mmol/L (ref 3.5–5.1)
Sodium: 142 mmol/L (ref 135–145)

## 2020-07-23 LAB — ECHOCARDIOGRAM COMPLETE
AR max vel: 1.38 cm2
AV Area VTI: 1.29 cm2
AV Area mean vel: 1.34 cm2
AV Mean grad: 1 mmHg
AV Peak grad: 1.5 mmHg
Ao pk vel: 0.62 m/s
Area-P 1/2: 4.91 cm2
Height: 63 in
S' Lateral: 2.36 cm
Weight: 1712 oz

## 2020-07-23 MED ORDER — SODIUM CHLORIDE 0.9 % IV SOLN
1.0000 g | INTRAVENOUS | Status: DC
  Administered 2020-07-23: 1 g via INTRAVENOUS
  Filled 2020-07-23: qty 1
  Filled 2020-07-23 (×2): qty 10

## 2020-07-23 MED ORDER — FUROSEMIDE 10 MG/ML IJ SOLN
40.0000 mg | Freq: Three times a day (TID) | INTRAMUSCULAR | Status: AC
  Administered 2020-07-23 (×2): 40 mg via INTRAVENOUS
  Filled 2020-07-23 (×2): qty 4

## 2020-07-23 MED ORDER — LORAZEPAM 0.5 MG PO TABS
0.5000 mg | ORAL_TABLET | Freq: Once | ORAL | Status: AC
  Administered 2020-07-23: 0.5 mg via ORAL
  Filled 2020-07-23: qty 1

## 2020-07-23 MED ORDER — NICOTINE 21 MG/24HR TD PT24
21.0000 mg | MEDICATED_PATCH | Freq: Every day | TRANSDERMAL | Status: DC
  Administered 2020-07-23 – 2020-07-24 (×2): 21 mg via TRANSDERMAL
  Filled 2020-07-23 (×2): qty 1

## 2020-07-23 NOTE — Progress Notes (Signed)
*  PRELIMINARY RESULTS* Echocardiogram 2D Echocardiogram has been performed.  [name] [name] 07/23/2020, 10:34 AM

## 2020-07-23 NOTE — Progress Notes (Signed)
[place] [facility] PROGRESS NOTE    [name] [name] [facility]  VOJ:[id number] DOB: 10-12-1928 DOA: 07/21/2020 PCP: Ezequiel Kayser, MD      Brief Narrative:  [name] [name] is a 86 y.o. F with Afib on Eliquis, COPD not on O2, lung CA s/p partial lobectomy still on chemo, BrCA remote, and HTN who presented with shortness of breath with exertion, orthopnea, leg swelling, and weakness.   In the ER, CXR showed bilateral opacities, BNP was elevated, and she appeared to have swelling on exam.  Was started on Lasix and admitted.          Assessment & Plan:  Acute on chronic diastolic CHF Baseline EF 99-37%.  Presents here with swelling, orthopnea, CXR opacities, dry cough, and DOE, BNP >1000.  Net -400 cc yesterday, creatinine slightly better.  Still short of breath.  Echo shows preserved EF. -Continue Lasix 40 IV twice daily   -K supplement -Strict I/Os, daily weights, telemetry  -Daily monitoring renal function     Cellulitis There is also an area of pain and swelling in the right foot, which is not fluctuant, but red, warm, tender and well-demarcated, likely cellulitis.  Doubt gout.  Doubt DVT given distribution and anticoagulation.  Redness and pain are marginally better today -Start ceftriaxone  CKD IIIb Baseline creatinine 1.1-1.4, stable relative to baseline  Chronic atrial fibrillation Acquired thrombophilia Heart rate controlled -Continue amiodarone, diltiazem, metoprolol -Continue apixaban   COPD No wheezing to suggest bronchospasm  Lung cancer  Hypertension -Continue diltiazem, metoprolol, simvastatin  Chronic steroid use Family unsure why the patient takes this -Continue dexamethasone  Mood disorder -Continue mirtazapine  Severe protein calorie malnutrition Failure to thrive As evidenced by severe loss of subcutaneous muscle mass and fat, BMI 18, and pressure injury.              Disposition: Status is: Inpatient  Remains  inpatient appropriate because:The patient is still severely symptomatic, too weak to get out of bed, tired.  Still requiring IV Lasix.   Dispo: The patient is from: Home              Anticipated d/c is to: SNF              Anticipated d/c date is: 1 day              Patient currently is not medically stable to d/c.   The patient is a 86 year old female admitted with CHF flare, cellulitis.  She has now begun treatment with Lasix and antibiotics but is having poor response to therapy.  I do not suspect progression of her cancer, she has no other significant electrolyte, renal, pulmonary or metabolic disease to explain her failure to thrive.  I suspect that she is failing to thrive, and if she does not respond to further diuresis in the next 24 hours, I will recommend Hospice enrollment at discharge.           MDM: The below labs and imaging reports were reviewed and summarized above.  Medication management as above.  This is a severe exacerbation of her chronic disease.     DVT prophylaxis: apixaban (ELIQUIS) tablet 2.5 mg Start: 07/21/20 2200 apixaban (ELIQUIS) tablet 2.5 mg  Code Status: DNR Family Communication: Daughter at the bedside    Consultants:     Procedures:   Echocardiogram    EF 50-55%, valvees normal  Antimicrobials:   Ceftriaxone 9/5>>  Culture data:  Subjective: Patient has pain, redness, and swelling of her right ankle.  She has no confusion, fever.  She has some thick sputum which is new.  She has no chest pain.  She is out of breath, she is extremely weak.  Objective: Vitals:   07/22/20 1655 07/22/20 2000 07/23/20 0030 07/23/20 0801  BP: 103/67 104/70 123/88 121/83  Pulse: 72 73 69 66  Resp: 18 16  15   Temp: 98 F (36.7 C) 97.8 F (36.6 C) 98.9 F (37.2 C) 98.2 F (36.8 C)  TempSrc: Oral Oral    SpO2: 95% 98% 99% 92%  Weight:      Height:        Intake/Output Summary (Last 24 hours) at 07/23/2020 1349 Last  data filed at 07/23/2020 1026 Gross per 24 hour  Intake 579.85 ml  Output 700 ml  Net -120.15 ml   Filed Weights   07/21/20 1814  Weight: 48.5 kg    Examination: General appearance: Thin elderly adult female, alert and in no acute distress, except obvious pain with any movement of her right leg.   HEENT: Anicteric, conjunctiva pink, lids and lashes normal. No nasal deformity, discharge, epistaxis.  Lips dry, oropharynx moist, no oral lesions, hearing diminished.   Skin: Warm and dry.  No jaundice.  No suspicious rashes or lesions other than a red patch on her right dorsal foot, slightly improved from yesterday. Cardiac: RRR, nl S1-S2, no murmurs appreciated.  Capillary refill is brisk.  JVP normal.  No LE edema.  Radial pulses 2+ and symmetric. Respiratory: Normal respiratory rate and rhythm.  Diminished at bilateral bases, rales at bilateral bases, more on the right.    Abdomen: Abdomen soft.  No TTP or guarding. No ascites, distension, hepatosplenomegaly.   MSK: No deformities or effusions.  Moderate loss of subcutaneous muscle mass and fat Neuro: Awake and alert.  EOMI, moves all extremities with severe generalized weakness, but symmetric during. Speech fluent.    Psych: Sensorium intact and responding to questions, attention normal. Affect flat.  Judgment and insight appear normal.    Data Reviewed: I have personally reviewed following labs and imaging studies:  CBC: Recent Labs  Lab 07/21/20 1825  WBC 11.0*  NEUTROABS 9.7*  HGB 10.2*  HCT 31.7*  MCV 106.0*  PLT 182*   Basic Metabolic Panel: Recent Labs  Lab 07/21/20 1825 07/22/20 0458 07/23/20 0435  NA 142 142 142  K 4.9 4.7 4.2  CL 110 107 105  CO2 22 26 25   GLUCOSE 152* 107* 155*  BUN 81* 81* 77*  CREATININE 1.38* 1.35* 1.29*  CALCIUM 8.7* 8.8* 8.6*  MG 2.5*  --   --    GFR: Estimated Creatinine Clearance: 22.2 mL/min (A) (by C-G formula based on SCr of 1.29 mg/dL (H)). Liver Function Tests: Recent Labs   Lab 07/21/20 1825  AST 26  ALT 42  ALKPHOS 113  BILITOT 0.7  PROT 6.0*  ALBUMIN 3.1*   No results for input(s): LIPASE, AMYLASE in the last 168 hours. No results for input(s): AMMONIA in the last 168 hours. Coagulation Profile: No results for input(s): INR, PROTIME in the last 168 hours. Cardiac Enzymes: No results for input(s): CKTOTAL, CKMB, CKMBINDEX, TROPONINI in the last 168 hours. BNP (last 3 results) No results for input(s): PROBNP in the last 8760 hours. HbA1C: No results for input(s): HGBA1C in the last 72 hours. CBG: No results for input(s): GLUCAP in the last 168 hours. Lipid Profile: No results for input(s): CHOL, HDL,  LDLCALC, TRIG, CHOLHDL, LDLDIRECT in the last 72 hours. Thyroid Function Tests: Recent Labs    07/21/20 1825  TSH 1.298   Anemia Panel: No results for input(s): VITAMINB12, FOLATE, FERRITIN, TIBC, IRON, RETICCTPCT in the last 72 hours. Urine analysis:    Component Value Date/Time   COLORURINE STRAW (A) 07/21/2020 1825   APPEARANCEUR CLEAR (A) 07/21/2020 1825   APPEARANCEUR Clear 03/05/2015 2050   LABSPEC 1.009 07/21/2020 1825   LABSPEC 1.020 03/05/2015 2050   PHURINE 5.0 07/21/2020 1825   GLUCOSEU NEGATIVE 07/21/2020 1825   GLUCOSEU Negative 03/05/2015 2050   HGBUR NEGATIVE 07/21/2020 1825   BILIRUBINUR NEGATIVE 07/21/2020 1825   BILIRUBINUR Negative 03/05/2015 2050   KETONESUR NEGATIVE 07/21/2020 1825   PROTEINUR NEGATIVE 07/21/2020 1825   NITRITE NEGATIVE 07/21/2020 1825   LEUKOCYTESUR NEGATIVE 07/21/2020 1825   LEUKOCYTESUR Trace 03/05/2015 2050   Sepsis Labs: @LABRCNTIP (procalcitonin:4,lacticacidven:4)  ) Recent Results (from the past 240 hour(s))  SARS Coronavirus 2 by RT PCR (hospital order, performed in [facility] hospital lab) Nasopharyngeal Nasopharyngeal Swab     Status: None   Collection Time: 07/21/20  8:29 PM   Specimen: Nasopharyngeal Swab  Result Value Ref Range Status   SARS Coronavirus 2 NEGATIVE NEGATIVE Final     Comment: (NOTE) SARS-CoV-2 target nucleic acids are NOT DETECTED.  The SARS-CoV-2 RNA is generally detectable in upper and lower respiratory specimens during the acute phase of infection. The lowest concentration of SARS-CoV-2 viral copies this assay can detect is 250 copies / mL. A negative result does not preclude SARS-CoV-2 infection and should not be used as the sole basis for treatment or other patient management decisions.  A negative result may occur with improper specimen collection / handling, submission of specimen other than nasopharyngeal swab, presence of viral mutation(s) within the areas targeted by this assay, and inadequate number of viral copies (<250 copies / mL). A negative result must be combined with clinical observations, patient history, and epidemiological information.  Fact Sheet for Patients:   StrictlyIdeas.no  Fact Sheet for Healthcare Providers: BankingDealers.co.za  This test is not yet approved or  cleared by the [place] FDA and has been authorized for detection and/or diagnosis of SARS-CoV-2 by FDA under an Emergency Use Authorization (EUA).  This EUA will remain in effect (meaning this test can be used) for the duration of the COVID-19 declaration under Section 564(b)(1) of the Act, 21 U.S.C. section 360bbb-3(b)(1), unless the authorization is terminated or revoked sooner.  Performed at [facility], [address] [place], [place] [address]          Radiology Studies: DG Chest 2 View  Result Date: 07/21/2020 CLINICAL DATA:  Productive cough for 3 weeks. EXAM: CHEST - 2 VIEW COMPARISON:  June 06, 2020 FINDINGS: Cardiomediastinal silhouette is normal. Mediastinal contours appear intact. Calcific atherosclerotic disease of the aorta. Linear peribronchial opacities in bilateral lung bases. Medial left lower lobe lung mass blends with the contour of the descending aorta. Osseous  structures are without acute abnormality. Soft tissues are grossly normal. IMPRESSION: Linear peribronchial opacities in bilateral lung bases may represent atelectasis or peribronchial airspace consolidation. Electronically Signed   By: Fidela Salisbury M.D.   On: 07/21/2020 18:43   US Venous Img Lower Unilateral Right (DVT)  Result Date: 07/22/2020 CLINICAL DATA:  Right lower extremity pain and edema for the past day. History of smoking. Evaluate for DVT. EXAM: RIGHT LOWER EXTREMITY VENOUS DOPPLER ULTRASOUND TECHNIQUE: Gray-scale sonography with graded compression, as well as color  Doppler and duplex ultrasound were performed to evaluate the lower extremity deep venous systems from the level of the common femoral vein and including the common femoral, femoral, profunda femoral, popliteal and calf veins including the posterior tibial, peroneal and gastrocnemius veins when visible. The superficial great saphenous vein was also interrogated. Spectral Doppler was utilized to evaluate flow at rest and with distal augmentation maneuvers in the common femoral, femoral and popliteal veins. COMPARISON:  None. FINDINGS: Contralateral Common Femoral Vein: Respiratory phasicity is normal and symmetric with the symptomatic side. No evidence of thrombus. Normal compressibility. Common Femoral Vein: No evidence of thrombus. Normal compressibility, respiratory phasicity and response to augmentation. Saphenofemoral Junction: No evidence of thrombus. Normal compressibility and flow on color Doppler imaging. Profunda Femoral Vein: No evidence of thrombus. Normal compressibility and flow on color Doppler imaging. Femoral Vein: No evidence of thrombus. Normal compressibility, respiratory phasicity and response to augmentation. Popliteal Vein: No evidence of thrombus. Normal compressibility, respiratory phasicity and response to augmentation. Calf Veins: No evidence of thrombus. Normal compressibility and flow on color Doppler  imaging. Superficial Great Saphenous Vein: No evidence of thrombus. Normal compressibility. Venous Reflux:  None. Other Findings:  None. IMPRESSION: No evidence of DVT within the right lower extremity. Electronically Signed   By: Sandi Mariscal M.D.   On: 07/22/2020 15:42   ECHOCARDIOGRAM COMPLETE  Result Date: 07/23/2020    ECHOCARDIOGRAM REPORT   Patient Name:   [name] [name] Date of Exam: 07/23/2020 Medical Rec #:  [id number]               Height:       63.0 in Accession #:    [id number]              Weight:       107.0 lb Date of Birth:  08-Sep-1928               BSA:          1.482 m Patient Age:    39 years                BP:           121/83 mmHg Patient Gender: F                       HR:           69 bpm. Exam Location:  [facility] Procedure: 2D Echo, Color Doppler and Cardiac Doppler Indications:     I50.9 Congestive Heart Failure  History:         Patient has no prior history of Echocardiogram examinations.                  Previous Myocardial Infarction and CAD, COPD; Risk                  Factors:Hypertension and Dyslipidemia.  Sonographer:     Charmayne Sheer RDCS (AE) Referring Phys:  [id number] Eben Burow Diagnosing Phys: Isaias Cowman MD  Sonographer Comments: Suboptimal apical window and suboptimal subcostal window. Image acquisition challenging due to COPD. IMPRESSIONS  1. Left ventricular ejection fraction, by estimation, is 50 to 55%. The left ventricle has low normal function. The left ventricle has no regional wall motion abnormalities. Left ventricular diastolic parameters were normal.  2. Right ventricular systolic function is normal. The right ventricular size is normal. There is normal pulmonary artery systolic pressure.  3. The mitral valve is normal in structure. Mild  mitral valve regurgitation. No evidence of mitral stenosis.  4. The aortic valve is normal in structure. Aortic valve regurgitation is not visualized. No aortic stenosis is present.  5. The inferior vena cava is  normal in size with greater than 50% respiratory variability, suggesting right atrial pressure of 3 mmHg. FINDINGS  Left Ventricle: Left ventricular ejection fraction, by estimation, is 50 to 55%. The left ventricle has low normal function. The left ventricle has no regional wall motion abnormalities. The left ventricular internal cavity size was normal in size. There is no left ventricular hypertrophy. Left ventricular diastolic parameters were normal. Right Ventricle: The right ventricular size is normal. No increase in right ventricular wall thickness. Right ventricular systolic function is normal. There is normal pulmonary artery systolic pressure. The tricuspid regurgitant velocity is 2.46 m/s, and  with an assumed right atrial pressure of 10 mmHg, the estimated right ventricular systolic pressure is 31.4 mmHg. Left Atrium: Left atrial size was normal in size. Right Atrium: Right atrial size was normal in size. Pericardium: There is no evidence of pericardial effusion. Mitral Valve: The mitral valve is normal in structure. Normal mobility of the mitral valve leaflets. Mild mitral valve regurgitation. No evidence of mitral valve stenosis. MV peak gradient, 3.5 mmHg. The mean mitral valve gradient is 1.0 mmHg. Tricuspid Valve: The tricuspid valve is normal in structure. Tricuspid valve regurgitation is mild . No evidence of tricuspid stenosis. Aortic Valve: The aortic valve is normal in structure. Aortic valve regurgitation is not visualized. No aortic stenosis is present. Aortic valve mean gradient measures 1.0 mmHg. Aortic valve peak gradient measures 1.5 mmHg. Aortic valve area, by VTI measures 1.29 cm. Pulmonic Valve: The pulmonic valve was normal in structure. Pulmonic valve regurgitation is not visualized. No evidence of pulmonic stenosis. Aorta: The aortic root is normal in size and structure. Venous: The inferior vena cava is normal in size with greater than 50% respiratory variability, suggesting right  atrial pressure of 3 mmHg. IAS/Shunts: No atrial level shunt detected by color flow Doppler.  LEFT VENTRICLE PLAX 2D LVIDd:         3.38 cm  Diastology LVIDs:         2.36 cm  LV e' lateral:   10.20 cm/s LV PW:         0.99 cm  LV E/e' lateral: 7.7 LV IVS:        0.73 cm  LV e' medial:    6.53 cm/s LVOT diam:     1.50 cm  LV E/e' medial:  12.0 LV SV:         15 LV SV Index:   10 LVOT Area:     1.77 cm  RIGHT VENTRICLE RV Basal diam:  4.70 cm LEFT ATRIUM           Index       RIGHT ATRIUM           Index LA diam:      2.80 cm 1.89 cm/m  RA Area:     21.00 cm LA Vol (A4C): 47.3 ml 31.91 ml/m RA Volume:   56.10 ml  37.85 ml/m  AORTIC VALVE                   PULMONIC VALVE AV Area (Vmax):    1.38 cm    PV Vmax:       0.52 m/s AV Area (Vmean):   1.34 cm    PV Vmean:  35.900 cm/s AV Area (VTI):     1.29 cm    PV VTI:        0.076 m AV Vmax:           61.60 cm/s  PV Peak grad:  1.1 mmHg AV Vmean:          41.800 cm/s PV Mean grad:  1.0 mmHg AV VTI:            0.113 m AV Peak Grad:      1.5 mmHg AV Mean Grad:      1.0 mmHg LVOT Vmax:         48.00 cm/s LVOT Vmean:        31.600 cm/s LVOT VTI:          0.082 m LVOT/AV VTI ratio: 0.73  AORTA Ao Root diam: 3.00 cm MITRAL VALVE               TRICUSPID VALVE MV Area (PHT): 4.91 cm    TR Peak grad:   24.2 mmHg MV Peak grad:  3.5 mmHg    TR Vmax:        246.00 cm/s MV Mean grad:  1.0 mmHg MV Vmax:       0.93 m/s    SHUNTS MV Vmean:      52.1 cm/s   Systemic VTI:  0.08 m MV Decel Time: 155 msec    Systemic Diam: 1.50 cm MV E velocity: 78.10 cm/s Isaias Cowman MD Electronically signed by Isaias Cowman MD Signature Date/Time: 07/23/2020/12:06:10 PM    Final         Scheduled Meds: . amiodarone  200 mg Oral BID  . apixaban  2.5 mg Oral BID  . cephALEXin  250 mg Oral Q8H  . dexamethasone  4 mg Oral Daily  . diltiazem  120 mg Oral Daily  . furosemide  40 mg Intravenous Q8H  . metoprolol succinate  25 mg Oral TID AC  . mirtazapine  15 mg Oral QHS  .  nicotine  21 mg Transdermal Daily  . potassium chloride  10 mEq Oral BID  . simvastatin  10 mg Oral QHS  . sodium chloride flush  3 mL Intravenous Q12H   Continuous Infusions: . sodium chloride       LOS: 2 days    Time spent: 25 minutes    Edwin Dada, MD [facility] 07/23/2020, 1:49 PM     Please page though [place] or Epic secure chat:  For [employer], Adult nurse

## 2020-07-23 NOTE — Progress Notes (Signed)
Ch arrived at room after OR for AD and call was made by RN to help complete AD before Pt;s possible discharge in the afternoon. Ch met with Pt and Pt;s daughter. Ch gave AD education. They wanted to look over the document more and call Ch when ready for notarization. Pt's daughter shared that it has been hard to get a notary to the house. She also shared that Pt already has DNR. MOST forms. Forms scanned and available in Pt's records.

## 2020-07-24 LAB — BASIC METABOLIC PANEL
Anion gap: 14 (ref 5–15)
BUN: 75 mg/dL — ABNORMAL HIGH (ref 8–23)
CO2: 25 mmol/L (ref 22–32)
Calcium: 8.5 mg/dL — ABNORMAL LOW (ref 8.9–10.3)
Chloride: 103 mmol/L (ref 98–111)
Creatinine, Ser: 1.29 mg/dL — ABNORMAL HIGH (ref 0.44–1.00)
GFR calc Af Amer: 42 mL/min — ABNORMAL LOW (ref >60)
GFR calc non Af Amer: 36 mL/min — ABNORMAL LOW (ref >60)
Glucose, Bld: 112 mg/dL — ABNORMAL HIGH (ref 70–99)
Potassium: 4.5 mmol/L (ref 3.5–5.1)
Sodium: 142 mmol/L (ref 135–145)

## 2020-07-24 LAB — CBC
HCT: 28.9 % — ABNORMAL LOW (ref 36.0–46.0)
Hemoglobin: 9.8 g/dL — ABNORMAL LOW (ref 12.0–15.0)
MCH: 34.4 pg — ABNORMAL HIGH (ref 26.0–34.0)
MCHC: 33.9 g/dL (ref 30.0–36.0)
MCV: 101.4 fL — ABNORMAL HIGH (ref 80.0–100.0)
Platelets: 134 10*3/uL — ABNORMAL LOW (ref 150–400)
RBC: 2.85 MIL/uL — ABNORMAL LOW (ref 3.87–5.11)
RDW: 18.2 % — ABNORMAL HIGH (ref 11.5–15.5)
WBC: 7.1 10*3/uL (ref 4.0–10.5)
nRBC: 11.3 % — ABNORMAL HIGH (ref 0.0–0.2)

## 2020-07-24 LAB — PATHOLOGIST SMEAR REVIEW

## 2020-07-24 MED ORDER — CEPHALEXIN 500 MG PO CAPS: 500.0000 mg | ORAL_CAPSULE | Freq: Three times a day (TID) | ORAL | 0 refills | Status: AC

## 2020-07-24 MED ORDER — FUROSEMIDE 10 MG/ML IJ SOLN
40.0000 mg | Freq: Three times a day (TID) | INTRAMUSCULAR | Status: DC
  Administered 2020-07-24: 40 mg via INTRAVENOUS
  Filled 2020-07-24: qty 4

## 2020-07-24 NOTE — Progress Notes (Signed)
Physical Therapy Treatment Patient Details Name: [name] [name] [facility] MRN: [id number] DOB: 12/26/1927 Today's Date: 07/24/2020    History of Present Illness Per MD note:[name] [name] is a 86 y.o. female with medical history significant for  HTN, HLD, A. fib on Eliquis, amiodarone, diltiazem, CAD and systolic CHF. Follows with [facility]ology, Dr. Nehemiah Massed. Patient presents to the Hospital with her daughter with concerns for 3 week of progressively worsening generalized weakness with development of orthopnea and nonproductive cough in the past few days. She has increase in swelling of legs the past few days. Has had to elevate head of bed to sleep as she feels smothered if she lays flat for the past few nights which is a new symptom for her.  She has not been able to ambulate for the last few days due to the generalized weakness.  She does have home health with home physical therapy twice a week and has been ambulating with a walker or assistance but for the last 2 days she has not been able to walk despite having assistance.  She has had required complete assistance to even stand up due to the weakness. She reports decreased urination frequency and volume past few days. Denies dysuria, hematuria or new incontinence. Denies fevers, flank pains, chest pain, syncope, abdominal pain or vomiting.    PT Comments    Pt lying in bed upon arrival to room with son at bedside. Pt initially refusing treatment stating that she has no energy reporting 0/10 current energy level. Pt agreeable to participate in bed mobility after education provided on importance of pressure relief and positional changes. Min A to lift BLE  against gravity off of pillows. Pt able to self initiate reaching with BUE toward bedrails and min A for positioning feet, with verbal cues to power through LE and pull on bedrail to initiate rolling. Mod A required to come into full sidelying position to both L and R. Pt positioned in 1/4  L sidelying position with pillows placed for comfort. Pt limited by decreased activity tolerance. Will continue to follow and progress pt as tolerated.    Follow Up Recommendations  Home health PT;Supervision/Assistance - 24 hour     Equipment Recommendations  None recommended by PT    Recommendations for Other Services       Precautions / Restrictions Precautions Precautions: None Restrictions Weight Bearing Restrictions: No    Mobility  Bed Mobility Overal bed mobility: Needs Assistance Bed Mobility: Rolling Rolling: Mod assist         General bed mobility comments: pt able to initiate reaching with BUE for bedrails and required mod A for fully reaching; performed rolling to both L and R with mod A for coming into sidelying position  Transfers                 General transfer comment: pt deferred  Ambulation/Gait             General Gait Details: not attempted   Stairs             Wheelchair Mobility    Modified Rankin (Stroke Patients Only)       Balance Overall balance assessment: Needs assistance     Sitting balance - Comments: pt deferred       Standing balance comment: pt deferred                            Cognition Arousal/Alertness: Awake/alert  Behavior During Therapy: WFL for tasks assessed/performed Overall Cognitive Status: Within Functional Limits for tasks assessed                                        Exercises Other Exercises Other Exercises: pt educated on important of pressure relief while lying in bed to prevent deep tissue injuries from lying in the same position    General Comments General comments (skin integrity, edema, etc.): noted some light bloody spotting on chuck pad from bandaged R upper arm      Pertinent Vitals/Pain Pain Assessment: Faces Faces Pain Scale: Hurts even more Pain Location: R ankle Pain Descriptors / Indicators: Discomfort;Grimacing Pain  Intervention(s): Monitored during session;Repositioned    Home Living                      Prior Function            PT Goals (current goals can now be found in the care plan section) Acute Rehab PT Goals PT Goal Formulation: With patient Time For Goal Achievement: 08/05/20 Potential to Achieve Goals: Fair Progress towards PT goals: Progressing toward goals    Frequency    Min 2X/week      PT Plan Current plan remains appropriate    Co-evaluation              AM-PAC PT "6 Clicks" Mobility   Outcome Measure  Help needed turning from your back to your side while in a flat bed without using bedrails?: A Lot Help needed moving from lying on your back to sitting on the side of a flat bed without using bedrails?: Total Help needed moving to and from a bed to a chair (including a wheelchair)?: Total Help needed standing up from a chair using your arms (e.g., wheelchair or bedside chair)?: Total Help needed to walk in hospital room?: Total Help needed climbing 3-5 steps with a railing? : Total 6 Click Score: 7    End of Session   Activity Tolerance: Patient limited by fatigue Patient left: in bed;with call bell/phone within reach;with bed alarm set;with family/visitor present Nurse Communication: Mobility status PT Visit Diagnosis: Unsteadiness on feet (R26.81);Muscle weakness (generalized) (M62.81);Difficulty in walking, not elsewhere classified (R26.2)     Time: 6468-0321 PT Time Calculation (min) (ACUTE ONLY): 16 min  Charges:                       Vale Haven, SPT   Vale Haven 07/24/2020, 12:42 PM

## 2020-07-24 NOTE — Progress Notes (Signed)
Pt was picked up at this time by EMS and escorted off the unit on a stretcher. Discharge paperwork was discussed with son and reviewed. Son conveyed understanding. PIV was removed from left arm. Bilateral upper extremities were cleaned and re wrapped with gauze and kerlix. Duoderm was left in place on specific skin tears. Pt was going home with homecare services in place.

## 2020-07-24 NOTE — TOC Progression Note (Signed)
Transition of Care [facility]) - Progression Note    Patient Details  Name: Natina Wiginton MRN: [id number] Date of Birth: 09/19/1928  Transition of Care [facility] CM/SW [place], RN Phone Number: 07/24/2020, 2:49 PM  Clinical Narrative:   Damaris Schooner with Gerald Stabs the patient's daughter , the patient has had [place] for the last few months and will not need, she has a WC, BSC, and a hospital bed at home, she needs a hoyer lift, The patient will be transported home today with [facility] Verified the address as [address] [place], Gerald Stabs stated that she would be able to manage the patient until the hoyer lift is delivered but wants it to be today, the family is considering Hospice but not made that decision yet. I called Zack with [employer] and arrange the hoyer lift to be delivered today        Expected Discharge Plan and Services           Expected Discharge Date: 07/24/20                                     Social Determinants of Health (SDOH) Interventions    Readmission Risk Interventions Readmission Risk Prevention Plan 02/20/2020  Transportation Screening Complete  PCP or Specialist Appt within 3-5 Days Complete  HRI or Home Care Consult Complete  Palliative Care Screening Not Applicable  Medication Review (RN Care Manager) Complete  Some recent data might be hidden

## 2020-07-24 NOTE — Care Management Important Message (Signed)
Important Message  Patient Details  Name: [name] [name] [facility] MRN: [id number] Date of Birth: 1928-06-25   Medicare Important Message Given:  Yes     [name] 07/24/2020, 11:52 AM

## 2020-07-24 NOTE — Discharge Summary (Signed)
Physician Discharge Summary  [name] [name] POE:[id number] DOB: 1928-02-19 DOA: 07/21/2020  PCP: Ezequiel Kayser, MD  Admit date: 07/21/2020 Discharge date: 07/24/2020  Admitted From: Home  Disposition:  Home with Palliative following   Recommendations for Outpatient Follow-up:  1. Follow up with [facility] in 1 week 2. If patient's PO intake, functional status/ambulation continue to worsen, I would recommend enrollment in Hospice soon      Home Health: PT/OT due to patient only able to transfer bed to chair  Equipment/Devices: Hoyer lift, hospital bed, walker, wheelchair, [facility]  Discharge Condition: Declining  CODE STATUS: DO NOT RESUSCITATE Diet recommendation: Liberal      Brief/Interim Summary: [name] [name] is a 86 y.o. F with Afib on Eliquis, COPD not on O2, lung CA s/p partial lobectomy, recent likely recurrence (patient declined biopsy confirmation) now failing to thrive, and HTN who presented with 1-2 weeks acute on chronic progressive shortness of breath with exertion, orthopnea, leg swelling, and weakness.   In the ER, CXR showed bilateral opacities, BNP was elevated, and she appeared to have swelling on exam.  Was started on Lasix and admitted.           PRINCIPAL HOSPITAL DIAGNOSIS: Acute on chronic diastolic CHF    Discharge Diagnoses:   Acute on chronic diastolic CHF Baseline EF 31-54%.  Presents here with swelling, orthopnea, CXR opacities, dry cough, and DOE, BNP >1000.  Started on IV Lasix and diuresed gently.  Echo showed preserved EF.   After diuresis, no further signs of fluid overload but no improvement in symptoms.        Cellulitis There was also an area of pain and swelling in the right foot, not fluctuant, but red, warm, tender and well-demarcated, likely cellulitis.  Doubt gout.  Doubt DVT given distribution and anticoagulation.  Started on antibiotics and redness, pain improved.    Continue cephalexin for 4 more  days.   Lung cancer Severe protein calorie malnutrition Failure to thrive Patient has had month over month progressive weakness and fatigue, now in the last 2 weeks unable to get out of chair.  I suspect this is driven by catabolism of her recurrent cancer, in setting of advanced age and poor functional status at baseline, not from her cellulitis or CHF.    Given her weakness appears to be from the underlying irreversible disease, I recommend re-engagement with Palliative care, discussion of DO NOT HOSPITALIZE order, and preparation for Hospice soon.     CKD IIIb  Chronic atrial fibrillation Acquired thrombophilia  COPD No wheezing to suggest bronchospasm  Hypertension             Discharge Instructions  Discharge Instructions    Discharge wound care:   Complete by: As directed    Cover with duoderm.  Change dressing as needed   Discharge wound care:   Complete by: As directed    Apply dressing Duoderm every other day   For home use only DME Other see comment   Complete by: As directed    [employer]   Length of Need: Lifetime   Increase activity slowly   Complete by: As directed      Allergies as of 07/24/2020      Reactions   Bactrim [sulfamethoxazole-trimethoprim] Other (See Comments)   Reduced kidney function.  Per Dr. Holley Raring should not take   Ibuprofen    Reduced kidney function. Per Dr. Holley Raring should not take   Macrobid [nitrofurantoin Monohyd Macro] Other (See Comments)  Reduced Kidney function.  Should no longer take per Dr. Holley Raring   Tape Rash      Medication List    TAKE these medications   acetaminophen 500 MG tablet Commonly known as: TYLENOL Take 500 mg by mouth every 6 (six) hours as needed. What changed: Another medication with the same name was removed. Continue taking this medication, and follow the directions you see here.   amiodarone 200 MG tablet Commonly known as: PACERONE Take 200 mg by mouth 2 (two) times daily.   azelastine  0.1 % nasal spray Commonly known as: ASTELIN Place 2 sprays into the nose 2 (two) times daily as needed. For runny nose   cephALEXin 500 MG capsule Commonly known as: KEFLEX Take 1 capsule (500 mg total) by mouth 3 (three) times daily for 4 days.   dexamethasone 4 MG tablet Commonly known as: DECADRON Take 1 tablet (4 mg total) by mouth daily.   diltiazem 120 MG 24 hr capsule Commonly known as: CARDIZEM CD Take 1 capsule (120 mg total) by mouth daily.   DuoDERM CGF Border Misc Apply 1 patch topically every other day. With dressing changes   Eliquis 2.5 MG Tabs tablet Generic drug: apixaban Take 2.5 mg by mouth 2 (two) times daily.   feeding supplement Liqd Take 1 Container by mouth 3 (three) times daily between meals.   furosemide 20 MG tablet Commonly known as: LASIX Take 0.5 tablets (10 mg total) by mouth daily.   metoprolol succinate 25 MG 24 hr tablet Commonly known as: TOPROL-XL Take 25 mg by mouth 3 (three) times daily.   mirtazapine 15 MG tablet Commonly known as: REMERON Take 15 mg by mouth at bedtime.   multivitamin with minerals Tabs tablet Take 1 tablet by mouth daily.   ondansetron 8 MG tablet Commonly known as: Zofran Take 1 tablet (8 mg total) by mouth 2 (two) times daily as needed for refractory nausea / vomiting.   PRESERVISION AREDS PO Take 1 tablet by mouth 2 (two) times daily.   ProAir HFA 108 (90 Base) MCG/ACT inhaler Generic drug: albuterol Inhale 2 puffs into the lungs every 6 (six) hours as needed for wheezing or shortness of breath.   simvastatin 20 MG tablet Commonly known as: ZOCOR Take 10 mg by mouth at bedtime.   Taclonex ointment Generic drug: calcipotriene-betamethasone Apply 1 application topically daily. To legs and back as needed   traMADol 50 MG tablet Commonly known as: Ultram Take 1 tablet (50 mg total) by mouth every 12 (twelve) hours as needed.   Woun'Dres Hydrogel Wound Dress Gel Apply 1 application topically  every other day. With dressing changes            Durable Medical Equipment  (From admission, onward)         Start     Ordered   07/24/20 0000  For home use only DME Other see comment       Comments: Harrel Lemon Lift  Question:  Length of Need  Answer:  Lifetime   07/24/20 1443           Discharge Care Instructions  (From admission, onward)         Start     Ordered   07/24/20 0000  Discharge wound care:       Comments: Cover with duoderm.  Change dressing as needed   07/24/20 1447   07/24/20 0000  Discharge wound care:       Comments: Apply dressing Duoderm every other  day   07/24/20 1447          Allergies  Allergen Reactions  . Bactrim [Sulfamethoxazole-Trimethoprim] Other (See Comments)    Reduced kidney function.  Per Dr. Holley Raring should not take  . Ibuprofen     Reduced kidney function. Per Dr. Holley Raring should not take  . Macrobid [employer] Macro] Other (See Comments)    Reduced Kidney function.  Should no longer take per Dr. Holley Raring  . Tape Rash       Procedures/Studies: DG Chest 2 View  Result Date: 07/21/2020 CLINICAL DATA:  Productive cough for 3 weeks. EXAM: CHEST - 2 VIEW COMPARISON:  June 06, 2020 FINDINGS: Cardiomediastinal silhouette is normal. Mediastinal contours appear intact. Calcific atherosclerotic disease of the aorta. Linear peribronchial opacities in bilateral lung bases. Medial left lower lobe lung mass blends with the contour of the descending aorta. Osseous structures are without acute abnormality. Soft tissues are grossly normal. IMPRESSION: Linear peribronchial opacities in bilateral lung bases may represent atelectasis or peribronchial airspace consolidation. Electronically Signed   By: Fidela Salisbury M.D.   On: 07/21/2020 18:43   US Venous Img Lower Unilateral Right (DVT)  Result Date: 07/22/2020 CLINICAL DATA:  Right lower extremity pain and edema for the past day. History of smoking. Evaluate for DVT. EXAM: RIGHT  LOWER EXTREMITY VENOUS DOPPLER ULTRASOUND TECHNIQUE: Gray-scale sonography with graded compression, as well as color Doppler and duplex ultrasound were performed to evaluate the lower extremity deep venous systems from the level of the common femoral vein and including the common femoral, femoral, profunda femoral, popliteal and calf veins including the posterior tibial, peroneal and gastrocnemius veins when visible. The superficial great saphenous vein was also interrogated. Spectral Doppler was utilized to evaluate flow at rest and with distal augmentation maneuvers in the common femoral, femoral and popliteal veins. COMPARISON:  None. FINDINGS: Contralateral Common Femoral Vein: Respiratory phasicity is normal and symmetric with the symptomatic side. No evidence of thrombus. Normal compressibility. Common Femoral Vein: No evidence of thrombus. Normal compressibility, respiratory phasicity and response to augmentation. Saphenofemoral Junction: No evidence of thrombus. Normal compressibility and flow on color Doppler imaging. Profunda Femoral Vein: No evidence of thrombus. Normal compressibility and flow on color Doppler imaging. Femoral Vein: No evidence of thrombus. Normal compressibility, respiratory phasicity and response to augmentation. Popliteal Vein: No evidence of thrombus. Normal compressibility, respiratory phasicity and response to augmentation. Calf Veins: No evidence of thrombus. Normal compressibility and flow on color Doppler imaging. Superficial Great Saphenous Vein: No evidence of thrombus. Normal compressibility. Venous Reflux:  None. Other Findings:  None. IMPRESSION: No evidence of DVT within the right lower extremity. Electronically Signed   By: Sandi Mariscal M.D.   On: 07/22/2020 15:42   ECHOCARDIOGRAM COMPLETE  Result Date: 07/23/2020    ECHOCARDIOGRAM REPORT   Patient Name:   [name] [name] Date of Exam: 07/23/2020 Medical Rec #:  [id number]               Height:       63.0 in  Accession #:    [id number]              Weight:       107.0 lb Date of Birth:  10-05-1928               BSA:          1.482 m Patient Age:    74 years  BP:           121/83 mmHg Patient Gender: F                       HR:           69 bpm. Exam Location:  [facility] Procedure: 2D Echo, Color Doppler and Cardiac Doppler Indications:     I50.9 Congestive Heart Failure  History:         Patient has no prior history of Echocardiogram examinations.                  Previous Myocardial Infarction and CAD, COPD; Risk                  Factors:Hypertension and Dyslipidemia.  Sonographer:     Charmayne Sheer RDCS (AE) Referring Phys:  [id number] Eben Burow Diagnosing Phys: Isaias Cowman MD  Sonographer Comments: Suboptimal apical window and suboptimal subcostal window. Image acquisition challenging due to COPD. IMPRESSIONS  1. Left ventricular ejection fraction, by estimation, is 50 to 55%. The left ventricle has low normal function. The left ventricle has no regional wall motion abnormalities. Left ventricular diastolic parameters were normal.  2. Right ventricular systolic function is normal. The right ventricular size is normal. There is normal pulmonary artery systolic pressure.  3. The mitral valve is normal in structure. Mild mitral valve regurgitation. No evidence of mitral stenosis.  4. The aortic valve is normal in structure. Aortic valve regurgitation is not visualized. No aortic stenosis is present.  5. The inferior vena cava is normal in size with greater than 50% respiratory variability, suggesting right atrial pressure of 3 mmHg. FINDINGS  Left Ventricle: Left ventricular ejection fraction, by estimation, is 50 to 55%. The left ventricle has low normal function. The left ventricle has no regional wall motion abnormalities. The left ventricular internal cavity size was normal in size. There is no left ventricular hypertrophy. Left ventricular diastolic parameters were normal. Right Ventricle: The  right ventricular size is normal. No increase in right ventricular wall thickness. Right ventricular systolic function is normal. There is normal pulmonary artery systolic pressure. The tricuspid regurgitant velocity is 2.46 m/s, and  with an assumed right atrial pressure of 10 mmHg, the estimated right ventricular systolic pressure is 60.7 mmHg. Left Atrium: Left atrial size was normal in size. Right Atrium: Right atrial size was normal in size. Pericardium: There is no evidence of pericardial effusion. Mitral Valve: The mitral valve is normal in structure. Normal mobility of the mitral valve leaflets. Mild mitral valve regurgitation. No evidence of mitral valve stenosis. MV peak gradient, 3.5 mmHg. The mean mitral valve gradient is 1.0 mmHg. Tricuspid Valve: The tricuspid valve is normal in structure. Tricuspid valve regurgitation is mild . No evidence of tricuspid stenosis. Aortic Valve: The aortic valve is normal in structure. Aortic valve regurgitation is not visualized. No aortic stenosis is present. Aortic valve mean gradient measures 1.0 mmHg. Aortic valve peak gradient measures 1.5 mmHg. Aortic valve area, by VTI measures 1.29 cm. Pulmonic Valve: The pulmonic valve was normal in structure. Pulmonic valve regurgitation is not visualized. No evidence of pulmonic stenosis. Aorta: The aortic root is normal in size and structure. Venous: The inferior vena cava is normal in size with greater than 50% respiratory variability, suggesting right atrial pressure of 3 mmHg. IAS/Shunts: No atrial level shunt detected by color flow Doppler.  LEFT VENTRICLE PLAX 2D LVIDd:  3.38 cm  Diastology LVIDs:         2.36 cm  LV e' lateral:   10.20 cm/s LV PW:         0.99 cm  LV E/e' lateral: 7.7 LV IVS:        0.73 cm  LV e' medial:    6.53 cm/s LVOT diam:     1.50 cm  LV E/e' medial:  12.0 LV SV:         15 LV SV Index:   10 LVOT Area:     1.77 cm  RIGHT VENTRICLE RV Basal diam:  4.70 cm LEFT ATRIUM           Index        RIGHT ATRIUM           Index LA diam:      2.80 cm 1.89 cm/m  RA Area:     21.00 cm LA Vol (A4C): 47.3 ml 31.91 ml/m RA Volume:   56.10 ml  37.85 ml/m  AORTIC VALVE                   PULMONIC VALVE AV Area (Vmax):    1.38 cm    PV Vmax:       0.52 m/s AV Area (Vmean):   1.34 cm    PV Vmean:      35.900 cm/s AV Area (VTI):     1.29 cm    PV VTI:        0.076 m AV Vmax:           61.60 cm/s  PV Peak grad:  1.1 mmHg AV Vmean:          41.800 cm/s PV Mean grad:  1.0 mmHg AV VTI:            0.113 m AV Peak Grad:      1.5 mmHg AV Mean Grad:      1.0 mmHg LVOT Vmax:         48.00 cm/s LVOT Vmean:        31.600 cm/s LVOT VTI:          0.082 m LVOT/AV VTI ratio: 0.73  AORTA Ao Root diam: 3.00 cm MITRAL VALVE               TRICUSPID VALVE MV Area (PHT): 4.91 cm    TR Peak grad:   24.2 mmHg MV Peak grad:  3.5 mmHg    TR Vmax:        246.00 cm/s MV Mean grad:  1.0 mmHg MV Vmax:       0.93 m/s    SHUNTS MV Vmean:      52.1 cm/s   Systemic VTI:  0.08 m MV Decel Time: 155 msec    Systemic Diam: 1.50 cm MV E velocity: 78.10 cm/s Isaias Cowman MD Electronically signed by Isaias Cowman MD Signature Date/Time: 07/23/2020/12:06:10 PM    Final        Subjective: The patient is asking to go home.  Her foot pain is improved, her breathing is close to baseline, she has no fever, confusion, vomiting.  She has severe generalized weakness.  Discharge Exam: Vitals:   07/24/20 0006 07/24/20 0831  BP: 109/66 100/68  Pulse: 61 75  Resp: 19 20  Temp: (!) 97.4 F (36.3 C) (!) 97.5 F (36.4 C)  SpO2: 97% 98%   Vitals:   07/23/20 0801 07/23/20 1544 07/24/20 0006 07/24/20 0831  BP:  121/83 112/77 109/66 100/68  Pulse: 66 70 61 75  Resp: 15 14 19 20   Temp: 98.2 F (36.8 C) (!) 97.5 F (36.4 C) (!) 97.4 F (36.3 C) (!) 97.5 F (36.4 C)  TempSrc:  Oral Oral Oral  SpO2: 92% 92% 97% 98%  Weight:      Height:        General: Pt is alert, awake, not in acute distress Cardiovascular: RRR, nl S1-S2, no  murmurs appreciated.   No LE edema.   Respiratory: Normal respiratory rate and rhythm.  CTAB without rales or wheezes. Abdominal: Abdomen soft and non-tender.  No distension or HSM.   Neuro/Psych: Strength symmetric in upper and lower extremities, severely weak.  Judgment and insight appear normal.   The results of significant diagnostics from this hospitalization (including imaging, microbiology, ancillary and laboratory) are listed below for reference.     Microbiology: Recent Results (from the past 240 hour(s))  SARS Coronavirus 2 by RT PCR (hospital order, performed in [facility] hospital lab) Nasopharyngeal Nasopharyngeal Swab     Status: None   Collection Time: 07/21/20  8:29 PM   Specimen: Nasopharyngeal Swab  Result Value Ref Range Status   SARS Coronavirus 2 NEGATIVE NEGATIVE Final    Comment: (NOTE) SARS-CoV-2 target nucleic acids are NOT DETECTED.  The SARS-CoV-2 RNA is generally detectable in upper and lower respiratory specimens during the acute phase of infection. The lowest concentration of SARS-CoV-2 viral copies this assay can detect is 250 copies / mL. A negative result does not preclude SARS-CoV-2 infection and should not be used as the sole basis for treatment or other patient management decisions.  A negative result may occur with improper specimen collection / handling, submission of specimen other than nasopharyngeal swab, presence of viral mutation(s) within the areas targeted by this assay, and inadequate number of viral copies (<250 copies / mL). A negative result must be combined with clinical observations, patient history, and epidemiological information.  Fact Sheet for Patients:   StrictlyIdeas.no  Fact Sheet for Healthcare Providers: BankingDealers.co.za  This test is not yet approved or  cleared by the [place] FDA and has been authorized for detection and/or diagnosis of SARS-CoV-2 by FDA under  an Emergency Use Authorization (EUA).  This EUA will remain in effect (meaning this test can be used) for the duration of the COVID-19 declaration under Section 564(b)(1) of the Act, 21 U.S.C. section 360bbb-3(b)(1), unless the authorization is terminated or revoked sooner.  Performed at [facility], [address] [place], [place] [address]      Labs: BNP (last 3 results) Recent Labs    02/18/20 0405 04/06/20 1222 07/21/20 1825  BNP 588.0* 438.1* 9,211.9*   Basic Metabolic Panel: Recent Labs  Lab 07/21/20 1825 07/22/20 0458 07/23/20 0435 07/24/20 0437  NA 142 142 142 142  K 4.9 4.7 4.2 4.5  CL 110 107 105 103  CO2 22 26 25 25   GLUCOSE 152* 107* 155* 112*  BUN 81* 81* 77* 75*  CREATININE 1.38* 1.35* 1.29* 1.29*  CALCIUM 8.7* 8.8* 8.6* 8.5*  MG 2.5*  --   --   --    Liver Function Tests: Recent Labs  Lab 07/21/20 1825  AST 26  ALT 42  ALKPHOS 113  BILITOT 0.7  PROT 6.0*  ALBUMIN 3.1*   No results for input(s): LIPASE, AMYLASE in the last 168 hours. No results for input(s): AMMONIA in the last 168 hours. CBC: Recent Labs  Lab 07/21/20 1825 07/24/20  0437  WBC 11.0* 7.1  NEUTROABS 9.7*  --   HGB 10.2* 9.8*  HCT 31.7* 28.9*  MCV 106.0* 101.4*  PLT 138* 134*   Cardiac Enzymes: No results for input(s): CKTOTAL, CKMB, CKMBINDEX, TROPONINI in the last 168 hours. BNP: Invalid input(s): POCBNP CBG: No results for input(s): GLUCAP in the last 168 hours. D-Dimer No results for input(s): DDIMER in the last 72 hours. Hgb A1c No results for input(s): HGBA1C in the last 72 hours. Lipid Profile No results for input(s): CHOL, HDL, LDLCALC, TRIG, CHOLHDL, LDLDIRECT in the last 72 hours. Thyroid function studies Recent Labs    07/21/20 1825  TSH 1.298   Anemia work up No results for input(s): VITAMINB12, FOLATE, FERRITIN, TIBC, IRON, RETICCTPCT in the last 72 hours. Urinalysis    Component Value Date/Time   COLORURINE STRAW (A) 07/21/2020  1825   APPEARANCEUR CLEAR (A) 07/21/2020 1825   APPEARANCEUR Clear 03/05/2015 2050   LABSPEC 1.009 07/21/2020 1825   LABSPEC 1.020 03/05/2015 2050   PHURINE 5.0 07/21/2020 1825   GLUCOSEU NEGATIVE 07/21/2020 1825   GLUCOSEU Negative 03/05/2015 2050   HGBUR NEGATIVE 07/21/2020 1825   BILIRUBINUR NEGATIVE 07/21/2020 1825   BILIRUBINUR Negative 03/05/2015 2050   KETONESUR NEGATIVE 07/21/2020 1825   PROTEINUR NEGATIVE 07/21/2020 1825   NITRITE NEGATIVE 07/21/2020 1825   LEUKOCYTESUR NEGATIVE 07/21/2020 1825   LEUKOCYTESUR Trace 03/05/2015 2050   Sepsis Labs Invalid input(s): PROCALCITONIN,  WBC,  LACTICIDVEN Microbiology Recent Results (from the past 240 hour(s))  SARS Coronavirus 2 by RT PCR (hospital order, performed in [place] hospital lab) Nasopharyngeal Nasopharyngeal Swab     Status: None   Collection Time: 07/21/20  8:29 PM   Specimen: Nasopharyngeal Swab  Result Value Ref Range Status   SARS Coronavirus 2 NEGATIVE NEGATIVE Final    Comment: (NOTE) SARS-CoV-2 target nucleic acids are NOT DETECTED.  The SARS-CoV-2 RNA is generally detectable in upper and lower respiratory specimens during the acute phase of infection. The lowest concentration of SARS-CoV-2 viral copies this assay can detect is 250 copies / mL. A negative result does not preclude SARS-CoV-2 infection and should not be used as the sole basis for treatment or other patient management decisions.  A negative result may occur with improper specimen collection / handling, submission of specimen other than nasopharyngeal swab, presence of viral mutation(s) within the areas targeted by this assay, and inadequate number of viral copies (<250 copies / mL). A negative result must be combined with clinical observations, patient history, and epidemiological information.  Fact Sheet for Patients:   StrictlyIdeas.no  Fact Sheet for Healthcare  Providers: BankingDealers.co.za  This test is not yet approved or  cleared by the [place] FDA and has been authorized for detection and/or diagnosis of SARS-CoV-2 by FDA under an Emergency Use Authorization (EUA).  This EUA will remain in effect (meaning this test can be used) for the duration of the COVID-19 declaration under Section 564(b)(1) of the Act, 21 U.S.C. section 360bbb-3(b)(1), unless the authorization is terminated or revoked sooner.  Performed at [facility], [address], [place], [place] [address]      Time coordinating discharge: 25 minutes      SIGNED:   Edwin Dada, MD  [facility] 07/24/2020, 3:01 PM

## 2020-07-30 ENCOUNTER — Telehealth: Payer: Self-pay

## 2020-07-30 ENCOUNTER — Telehealth: Payer: Self-pay | Admitting: *Deleted

## 2020-07-30 ENCOUNTER — Other Ambulatory Visit: Payer: Self-pay | Admitting: Hospice and Palliative Medicine

## 2020-07-30 MED ORDER — TRAZODONE HCL 50 MG PO TABS: 25.0000 mg | ORAL_TABLET | Freq: Every evening | ORAL | 2 refills | Status: AC | PRN

## 2020-07-30 NOTE — Telephone Encounter (Signed)
Patient daughter would like to speak with someone about transitioning her mother to at home hospice care. She is having a particularly difficult time during the night. Please call her.

## 2020-07-30 NOTE — Telephone Encounter (Signed)
I spoke with daughter who confirmed desire to pursue hospice. Order was called in to [facility].

## 2020-07-30 NOTE — Progress Notes (Signed)
I spoke with patient's daughter. She reports that patient has had significant functional decline over the past two weeks. She feels patient is likely approaching end of life and would like to pursue hospice at home.   Daughter says that patient is also not sleeping well. She no longer finds the mirtazapine effective.  Plan: -Best supportive care -Hospice at home -D/C mirtazapine -Start trazodone 25-50mg  [facility] PRN

## 2020-07-31 ENCOUNTER — Other Ambulatory Visit: Payer: Medicare Other | Admitting: Primary Care

## 2020-07-31 ENCOUNTER — Telehealth: Payer: Self-pay | Admitting: *Deleted

## 2020-07-31 NOTE — Telephone Encounter (Signed)
Katie with Hospice called requesting prescription for Roxanol 0.25 ml every 2 hours as needed pain/ shortness of breath, Ativan 0.5 mg every 4 hours as needed restlessness or agitation, asking to change Eliquis to ASA or Coumadin as hospice does not cover Eliquis, Also asking to change patient inhaler to DuoNebs SVN every 4 hours as needed for shortness of breath. Please advise

## 2020-07-31 NOTE — Telephone Encounter (Signed)
No to coumadin. Does she even need to Eliquis?

## 2020-07-31 NOTE — Telephone Encounter (Signed)
Returned call to patient's daughter. Left message with family member who said will have patient's daughter return call.

## 2020-07-31 NOTE — Telephone Encounter (Signed)
CAn you send in the prescriptions for Roxanol, Ativan and DuoNebs please

## 2020-07-31 NOTE — Telephone Encounter (Signed)
I am okay with those changes except that I would defer change in her anticoagulation to Dr. Grayland Ormond. I would probably not recommend Coumadin due to need to frequently check INRs. Will see if he is okay with just ASA.

## 2020-08-01 ENCOUNTER — Other Ambulatory Visit: Payer: Self-pay | Admitting: Hospice and Palliative Medicine

## 2020-08-01 DIAGNOSIS — C3432 Malignant neoplasm of lower lobe, left bronchus or lung: Secondary | ICD-10-CM

## 2020-08-01 MED ORDER — MORPHINE SULFATE (CONCENTRATE) 10 MG /0.5 ML PO SOLN: 5.0000 mg | ORAL | 0 refills | Status: AC | PRN

## 2020-08-01 MED ORDER — LORAZEPAM 0.5 MG PO TABS: 0.5000 mg | ORAL_TABLET | Freq: Three times a day (TID) | ORAL | 0 refills | Status: AC | PRN

## 2020-08-01 MED ORDER — IPRATROPIUM-ALBUTEROL 0.5-2.5 (3) MG/3ML IN SOLN: 3.0000 mL | RESPIRATORY_TRACT | Status: DC | PRN

## 2020-08-01 NOTE — Telephone Encounter (Signed)
No Eliquis or Coumadin, what about ASA, or nothing?

## 2020-08-01 NOTE — Telephone Encounter (Addendum)
[name] [name] called asking if statins and vitamins can be stopped? Please advise

## 2020-08-01 NOTE — Telephone Encounter (Signed)
Yes, statin and vitamins can be stopped but please have hospice talk with daughter Gerald Stabs. She is very involved and may or may not want these medications stopped. Okay to stop Eliquis and start ASA 325mg  daily. Would suggest taking concurrent PPI such as omeprazole 20mg  daily.

## 2020-08-01 NOTE — Telephone Encounter (Signed)
[facility] called to [facility] per response from Sharion Dove, NP. She repeated back to me.

## 2020-08-01 NOTE — Telephone Encounter (Signed)
Rx sent. Will you let hospice know about the anticoagulation? Thanks!

## 2020-08-01 NOTE — Progress Notes (Signed)
Rx for morphine, lorazepam, and duonebs sent per hospice request.

## 2020-08-02 ENCOUNTER — Other Ambulatory Visit: Payer: Medicare Other | Admitting: Primary Care

## 2020-08-14 ENCOUNTER — Ambulatory Visit: Payer: Medicare Other | Admitting: Oncology

## 2020-08-14 ENCOUNTER — Ambulatory Visit: Payer: Medicare Other

## 2020-08-14 ENCOUNTER — Other Ambulatory Visit: Payer: Medicare Other

## 2020-08-15 ENCOUNTER — Ambulatory Visit: Payer: Medicare Other | Admitting: Oncology

## 2020-08-15 ENCOUNTER — Ambulatory Visit: Payer: Medicare Other | Admitting: Radiation Oncology

## 2020-08-15 ENCOUNTER — Other Ambulatory Visit: Payer: Medicare Other

## 2020-08-15 ENCOUNTER — Ambulatory Visit: Payer: Medicare Other

## 2020-08-17 DEATH — deceased

## 2020-08-20 ENCOUNTER — Telehealth: Payer: Medicare Other | Admitting: Hospice and Palliative Medicine

## 2020-10-22 IMAGING — CT CT ABD-PELV W/ CM
2 of 5 series · 14 of 46 positions shown, 16 images · IV contrast (APPLIED)
Comparison: November 01, 2019

CLINICAL DATA: Abdominal pain, acute with RIGHT upper quadrant and
LEFT lower quadrant pain.

EXAM:
CT ABDOMEN AND PELVIS WITH CONTRAST
TECHNIQUE: Multidetector CT imaging of the abdomen and pelvis was performed
using the standard protocol following bolus administration of
intravenous contrast.
CONTRAST:  60mL OMNIPAQUE IOHEXOL 300 MG/ML  SOLN

[Series 2: routine abd/pel with · axial · 0.67mm/px · z∈[-502,-147]mm · 11 of 81 slices shown, 13 images]
[im 5/81  soft-tissue]
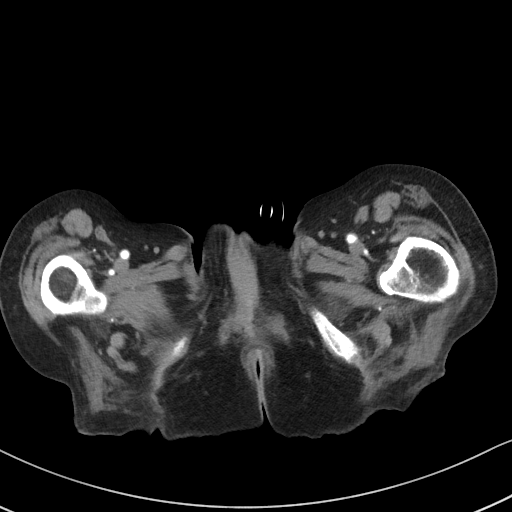
[im 5/81  bone]
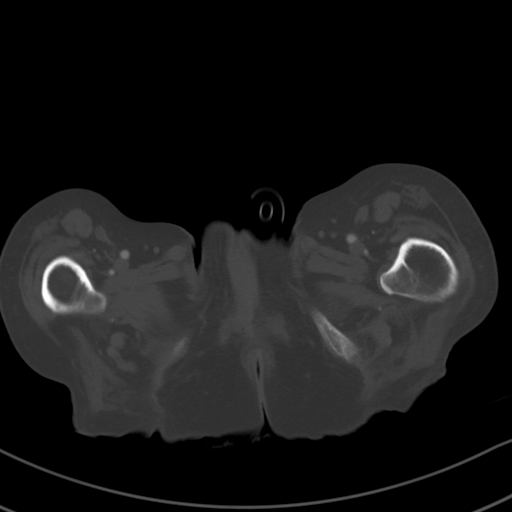
[im 13/81  soft-tissue]
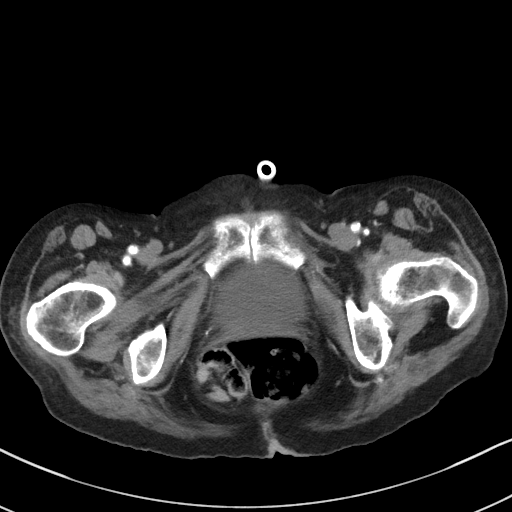
[im 22/81  soft-tissue]
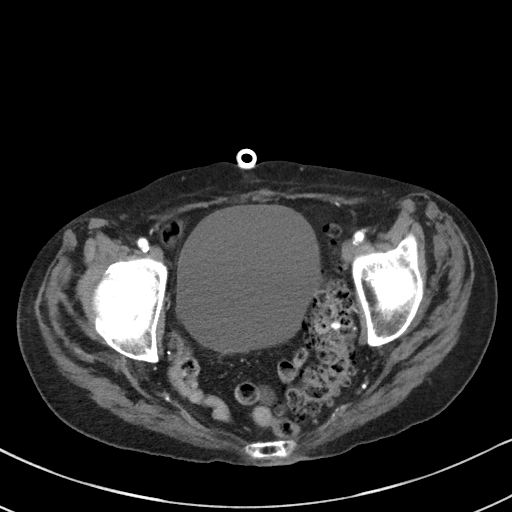
[im 26/81  soft-tissue]
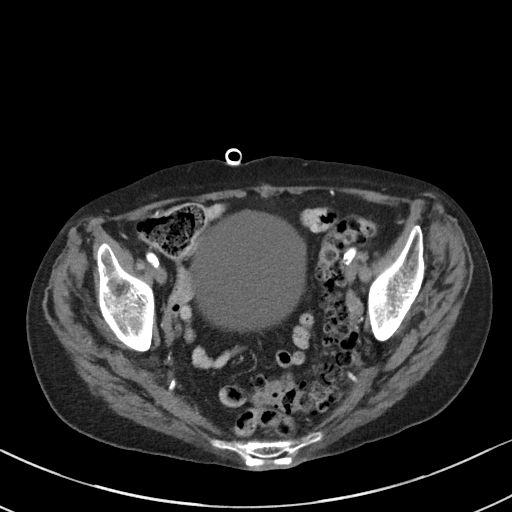
[im 34/81  soft-tissue]
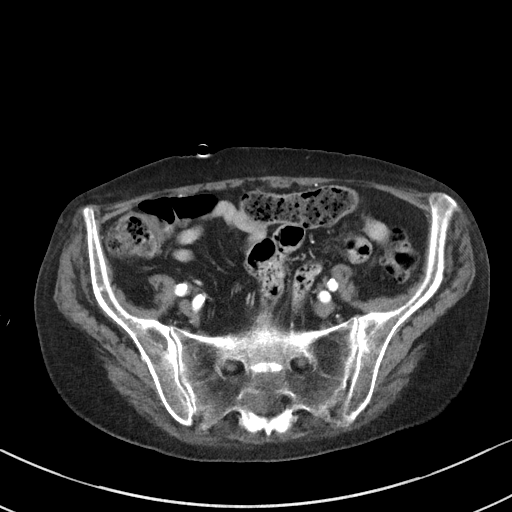
[im 43/81  soft-tissue]
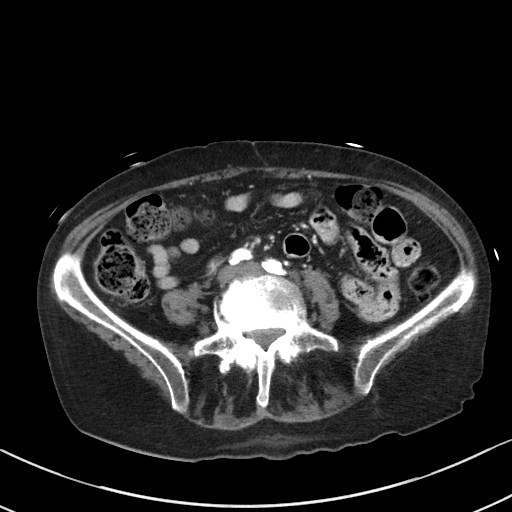
[im 47/81  soft-tissue]
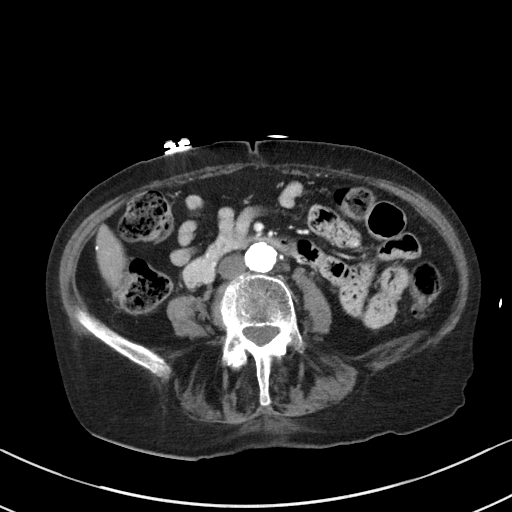
[im 55/81  soft-tissue]
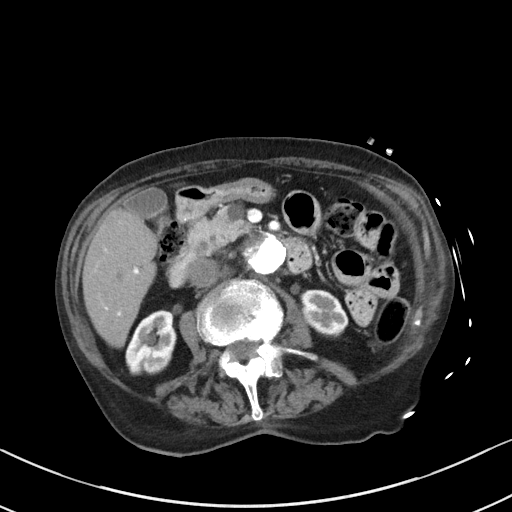
[im 59/81  soft-tissue]
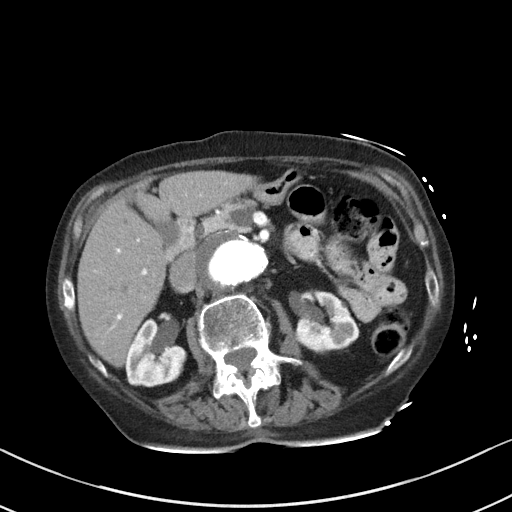
[im 59/81  bone]
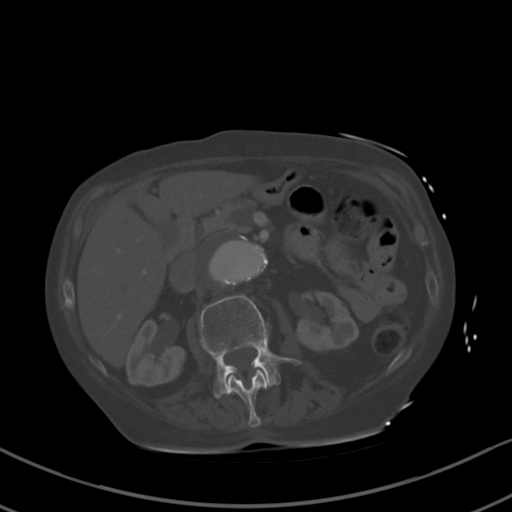
[im 68/81  soft-tissue]
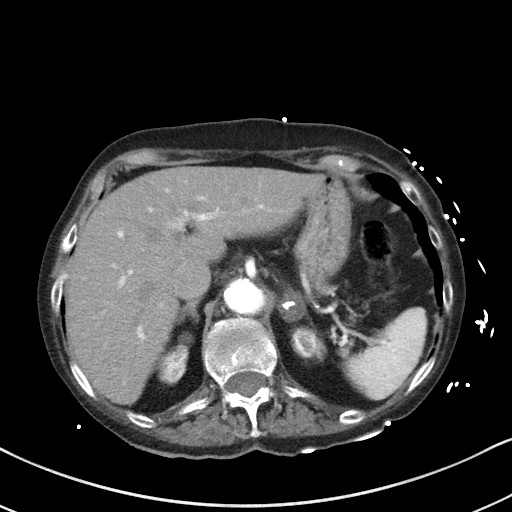
[im 76/81  soft-tissue]
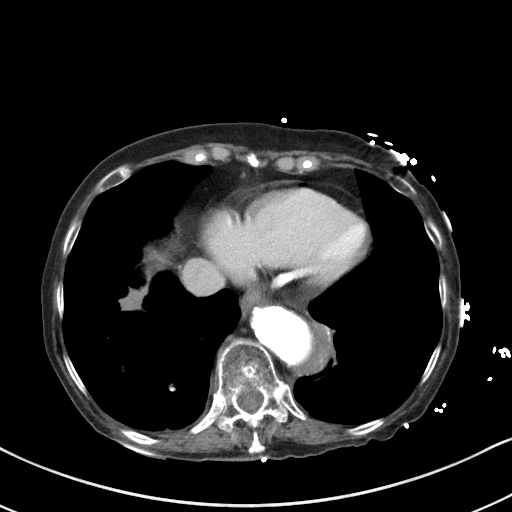

[Series 5: coronal st · coronal · 0.67mm/px · 3 of 73 slices shown]
[im 25/73  soft-tissue]
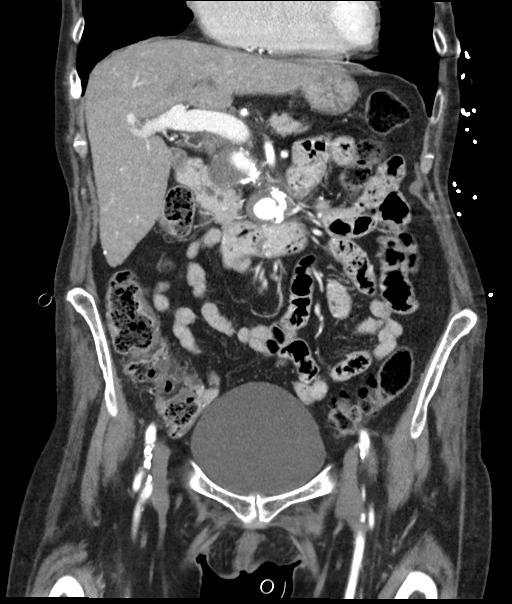
[im 33/73  soft-tissue]
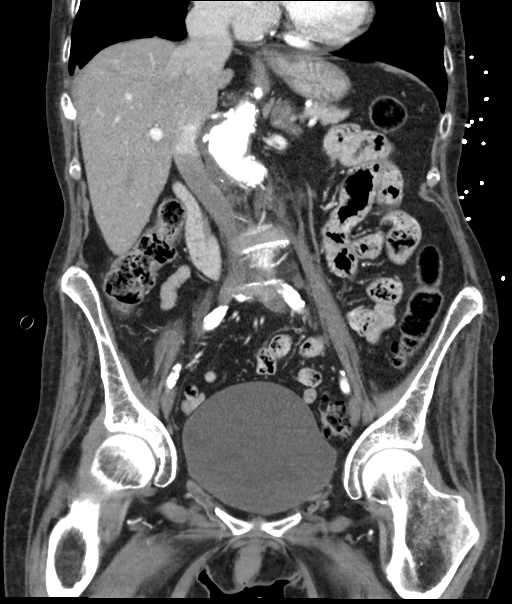
[im 41/73  soft-tissue]
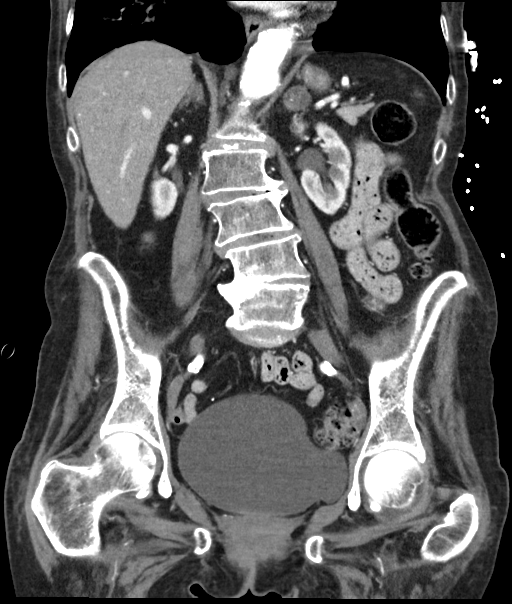

[14 of 46 positions shown; findings below may reference images not displayed]

FINDINGS: Lower chest: Signs of cylindrical bronchiectasis and inspissated
material within RIGHT lower lobe bronchi. Overlying the RIGHT
hemidiaphragm is an area of nodularity measuring 2.3 x 1.4 cm this
has not changed significantly in the interval since Friday October, 2019, perhaps even slightly smaller though position of the RIGHT
hemidiaphragm could change appearance.

Marked dilation of the distal thoracic aorta with tortuosity and
eccentric plaque measuring approximately 4.2 cm greatest axial
dimension similar to Monday September, 2018. Due to tortuosity
measurements could be exaggerated. Measurement in long axis is
cm. This is unchanged and likely exaggerated by the aortic
tortuosity at this level in the lower chest. Signs of prior
percutaneous coronary intervention. No pleural effusion.

Pulmonary emphysema.

Hepatobiliary: Mildly lobular hepatic contours. No focal, suspicious
hepatic lesion. No biliary duct dilation. No pericholecystic
stranding.

Pancreas: 1.9 x 1.8 cm cystic lesion in the head of the pancreas
(image 24, series 2) no main duct dilation or peripancreatic
inflammation.

Spleen: Spleen normal in size and contour.

Adrenals/Urinary Tract: LEFT adrenal with enlargement and
calcification measuring approximately 2.0 x 1.8 cm in greatest axial
dimension. Given position of adrenal with inspiratory effort this
could be altered but it appears that it may be smaller, when
measured in a similar location previously it measured approximately
2.5 x 1.8 cm, measuring approximately 1.5 cm greatest thickness on
today's study more superiorly near the calcification previously
approximately 1.9 cm in a similar location. The RIGHT adrenal is
normal.

Urinary bladder is distended. No hydronephrosis. Renal cortical
scarring. Small cysts in the kidneys.

Stomach/Bowel: No acute gastrointestinal process. Colonic
diverticulosis.

Vascular/Lymphatic: Vascular disease with infrarenal abdominal
aortic aneurysm measuring approximately 4.8 x 3.8 cm just below the
renal arteries. The aortic tortuosity showing a similar appearance.
Tortuosity exaggerating luminal size, appearance similar to the
previous exam with large amount of eccentric mural thrombus/soft
plaque along the RIGHT lateral wall at the area of greatest
dilation. Chronic focal non flow limiting dissection in the distal
portion of the aorta just above the iliac bifurcation with maximal
caliber proximally 2.9 cm at this level, less soft plaque. No
periaortic stranding. Patent visceral branches despite severe
atheromatous changes. No adenopathy in the upper abdomen or in the
retroperitoneum. Small retrocrural lymph nodes are stable.

No pelvic adenopathy.

Reproductive: Post hysterectomy with signs of pelvic floor
dysfunction with bowing of pelvic floor musculature.

Other: No ascites.  No free air.

Musculoskeletal: No acute bone finding. No destructive bone process.
IMPRESSION: No CT signs of acute abnormality in the abdomen or pelvis post
infectious changes in the RIGHT lower lobe along with a more focal
nodular area at the RIGHT lung base above the RIGHT hemidiaphragm
potentially post infectious scarring. Given patient history would
suggest close attention on follow-up.

Thoracic and abdominal aneurysmal dilation and severe vascular
disease with tortuosity as described. Findings not changed since
prior imaging.

Stable cystic pancreatic lesion, attention on follow-up.
